# Patient Record
Sex: Female | Born: 1970 | Race: White | Hispanic: No | Marital: Married | Smoking: Never smoker
Health system: Southern US, Community
[De-identification: ages and names within clinical notes are randomized; demographics above are authoritative.]

## PROBLEM LIST (undated history)

## (undated) DIAGNOSIS — R32 Unspecified urinary incontinence: Secondary | ICD-10-CM

## (undated) DIAGNOSIS — IMO0002 Reserved for concepts with insufficient information to code with codable children: Secondary | ICD-10-CM

## (undated) DIAGNOSIS — I499 Cardiac arrhythmia, unspecified: Secondary | ICD-10-CM

## (undated) DIAGNOSIS — G43019 Migraine without aura, intractable, without status migrainosus: Secondary | ICD-10-CM

## (undated) DIAGNOSIS — K760 Fatty (change of) liver, not elsewhere classified: Secondary | ICD-10-CM

## (undated) DIAGNOSIS — G43709 Chronic migraine without aura, not intractable, without status migrainosus: Secondary | ICD-10-CM

## (undated) DIAGNOSIS — R7303 Prediabetes: Secondary | ICD-10-CM

## (undated) DIAGNOSIS — J45909 Unspecified asthma, uncomplicated: Secondary | ICD-10-CM

## (undated) DIAGNOSIS — R002 Palpitations: Secondary | ICD-10-CM

## (undated) DIAGNOSIS — E119 Type 2 diabetes mellitus without complications: Secondary | ICD-10-CM

## (undated) DIAGNOSIS — K219 Gastro-esophageal reflux disease without esophagitis: Secondary | ICD-10-CM

## (undated) DIAGNOSIS — E785 Hyperlipidemia, unspecified: Secondary | ICD-10-CM

## (undated) DIAGNOSIS — I10 Essential (primary) hypertension: Secondary | ICD-10-CM

## (undated) DIAGNOSIS — N809 Endometriosis, unspecified: Secondary | ICD-10-CM

## (undated) DIAGNOSIS — G35 Multiple sclerosis: Secondary | ICD-10-CM

## (undated) DIAGNOSIS — Z5189 Encounter for other specified aftercare: Secondary | ICD-10-CM

## (undated) DIAGNOSIS — G4730 Sleep apnea, unspecified: Secondary | ICD-10-CM

## (undated) DIAGNOSIS — E039 Hypothyroidism, unspecified: Secondary | ICD-10-CM

## (undated) HISTORY — PX: CHOLECYSTECTOMY: SHX55

## (undated) HISTORY — PX: ABDOMINAL HYSTERECTOMY: SHX81

## (undated) HISTORY — DX: Reserved for concepts with insufficient information to code with codable children: IMO0002

## (undated) HISTORY — DX: Cardiac arrhythmia, unspecified: I49.9

## (undated) HISTORY — DX: Sleep apnea, unspecified: G47.30

## (undated) HISTORY — DX: Hyperlipidemia, unspecified: E78.5

## (undated) HISTORY — DX: Encounter for other specified aftercare: Z51.89

## (undated) HISTORY — DX: Essential (primary) hypertension: I10

## (undated) HISTORY — PX: REPLACEMENT TOTAL KNEE: SUR1224

## (undated) HISTORY — DX: Hypothyroidism, unspecified: E03.9

## (undated) HISTORY — DX: Type 2 diabetes mellitus without complications: E11.9

## (undated) HISTORY — DX: Chronic migraine without aura, not intractable, without status migrainosus: G43.709

## (undated) HISTORY — DX: Gastro-esophageal reflux disease without esophagitis: K21.9

## (undated) HISTORY — DX: Multiple sclerosis: G35

## (undated) HISTORY — DX: Palpitations: R00.2

## (undated) HISTORY — DX: Endometriosis, unspecified: N80.9

## (undated) HISTORY — DX: Unspecified asthma, uncomplicated: J45.909

## (undated) HISTORY — DX: Fatty (change of) liver, not elsewhere classified: K76.0

## (undated) HISTORY — DX: Unspecified urinary incontinence: R32

## (undated) HISTORY — PX: ENDOMETRIAL ABLATION: SHX621

---
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
## 2018-06-07 HISTORY — PX: FOOT SURGERY: SHX648

## 2018-11-24 ENCOUNTER — Other Ambulatory Visit: Payer: Self-pay

## 2018-11-24 ENCOUNTER — Ambulatory Visit: Payer: Medicaid Other | Admitting: Physician Assistant

## 2018-11-24 ENCOUNTER — Encounter: Payer: Self-pay | Admitting: Physician Assistant

## 2018-11-24 VITALS — BP 128/90 | HR 88 | Temp 98.8°F

## 2018-11-24 DIAGNOSIS — Z862 Personal history of diseases of the blood and blood-forming organs and certain disorders involving the immune mechanism: Secondary | ICD-10-CM

## 2018-11-24 DIAGNOSIS — E669 Obesity, unspecified: Secondary | ICD-10-CM

## 2018-11-24 DIAGNOSIS — G35 Multiple sclerosis: Secondary | ICD-10-CM

## 2018-11-24 DIAGNOSIS — L719 Rosacea, unspecified: Secondary | ICD-10-CM

## 2018-11-24 DIAGNOSIS — E039 Hypothyroidism, unspecified: Secondary | ICD-10-CM

## 2018-11-24 DIAGNOSIS — G43909 Migraine, unspecified, not intractable, without status migrainosus: Secondary | ICD-10-CM

## 2018-11-24 DIAGNOSIS — Z7689 Persons encountering health services in other specified circumstances: Secondary | ICD-10-CM

## 2018-11-24 DIAGNOSIS — Z13220 Encounter for screening for lipoid disorders: Secondary | ICD-10-CM

## 2018-11-24 DIAGNOSIS — Z90710 Acquired absence of both cervix and uterus: Secondary | ICD-10-CM

## 2018-11-24 DIAGNOSIS — I10 Essential (primary) hypertension: Secondary | ICD-10-CM

## 2018-11-24 DIAGNOSIS — E119 Type 2 diabetes mellitus without complications: Secondary | ICD-10-CM

## 2018-11-24 DIAGNOSIS — J302 Other seasonal allergic rhinitis: Secondary | ICD-10-CM

## 2018-11-24 LAB — GLUCOSE, POCT (MANUAL RESULT ENTRY): POC Glucose: 153 mg/dl — AB (ref 70–99)

## 2018-11-24 MED ORDER — LISINOPRIL 10 MG PO TABS: 10.0000 mg | ORAL_TABLET | Freq: Every day | ORAL | 1 refills | Status: DC

## 2018-11-24 MED ORDER — LEVOTHYROXINE SODIUM 25 MCG PO TABS: 25.0000 ug | ORAL_TABLET | Freq: Every day | ORAL | 0 refills | Status: DC

## 2018-11-24 MED ORDER — METOPROLOL TARTRATE 100 MG PO TABS: 100.0000 mg | ORAL_TABLET | Freq: Two times a day (BID) | ORAL | 1 refills | Status: DC

## 2018-11-24 MED ORDER — LEVOTHYROXINE SODIUM 200 MCG PO TABS: 200.0000 ug | ORAL_TABLET | Freq: Every day | ORAL | 0 refills | Status: DC

## 2018-11-24 NOTE — Patient Instructions (Signed)
Notice of Award for both benefits

## 2018-11-24 NOTE — Progress Notes (Signed)
BP 128/90   Pulse 88   Temp 98.8 F (37.1 C)   SpO2 98%    Subjective:    Patient ID: Amy Lester, female    DOB: 01-Jul-1971, 48 y.o.   MRN: [id number]  HPI: Amy Lester is a 48 y.o. female presenting on 11/24/2018 for New Patient (Initial Visit) (pt recently moved here from [place] in Oct. 2019. pt last saw PCP in Sept. for her foot.)   HPI   Chief Complaint  Patient presents with  . New Patient (Initial Visit)    pt recently moved here from [place] in Oct. 2019. pt last saw PCP in Sept. for her foot.     Pt usually only uses her inhaler in spring and fall with allergies.  Pt self-stopped her metformin because it was making her feel dizzy and "yucky"  She has MS and was being treated for that through [facility] and is currently not on anything at present.  she c/o muscle pain and frequent falling.   She was diagnosed with MS in 2006 or so.   She says she Has roscea - she uses tretinoin and metrogel  Pt says she has seen cardiology for palpitations.  She says she had lots of tests and nothing abnormal was found, just palpitations  Pt says she has history of transusion (was due to heavy menses).  Pt states history of hyperlipidemia  Pt states only thyroid issue is hypothyroidism but she takes cytomel in addition to the levothyroxine.   Pt states last mammogram was in  June 2019  Pt brings in no bottles of medications, just a list.  Relevant past medical, surgical, family and social history reviewed and updated as indicated. Interim medical history since our last visit reviewed. Allergies and medications reviewed and updated.   Current Outpatient Medications:  .  albuterol (PROVENTIL HFA;VENTOLIN HFA) 108 (90 Base) MCG/ACT inhaler, Inhale 3 puffs into the lungs every 6 (six) hours as needed for wheezing or shortness of breath., Disp: , Rfl:  .  cetirizine (ZYRTEC) 10 MG chewable tablet, Chew 10 mg by mouth daily., Disp: , Rfl:  .  clotrimazole-betamethasone  (LOTRISONE) cream, Apply 1 application topically as needed., Disp: , Rfl:  .  estradiol (EVAMIST) 1.53 MG/SPRAY transdermal spray, Place 3 sprays onto the skin daily., Disp: , Rfl:  .  ferrous sulfate 325 (65 FE) MG tablet, Take 325 mg by mouth daily with breakfast., Disp: , Rfl:  .  Frovatriptan Succinate (FROVA PO), Take 4 mg by mouth as needed (headaches)., Disp: , Rfl:  .  hydrochlorothiazide (HYDRODIURIL) 25 MG tablet, Take 25 mg by mouth daily., Disp: , Rfl:  .  HYDROCODONE-ACETAMINOPHEN PO, Take 1 tablet by mouth as needed., Disp: , Rfl:  .  levothyroxine (SYNTHROID) 200 MCG tablet, Take 200 mcg by mouth daily before breakfast., Disp: , Rfl:  .  levothyroxine (SYNTHROID, LEVOTHROID) 25 MCG tablet, Take 25 mcg by mouth daily before breakfast., Disp: , Rfl:  .  liothyronine (CYTOMEL) 5 MCG tablet, Take 5 mcg by mouth daily., Disp: , Rfl:  .  lisinopril (PRINIVIL,ZESTRIL) 10 MG tablet, Take 10 mg by mouth daily., Disp: , Rfl:  .  metoprolol tartrate (LOPRESSOR) 25 MG tablet, Take 25 mg by mouth every morning., Disp: , Rfl:  .  metoprolol tartrate (LOPRESSOR) 50 MG tablet, Take 50 mg by mouth at bedtime., Disp: , Rfl:  .  metroNIDAZOLE (METROGEL) 1 % gel, Apply 1 application topically daily., Disp: , Rfl:  .  Multiple Vitamins-Minerals (MULTIVITAMIN WITH MINERALS) tablet, Take 1 tablet by mouth daily., Disp: , Rfl:  .  nystatin cream (MYCOSTATIN), Apply 1 application topically daily as needed for dry skin., Disp: , Rfl:  .  Olopatadine HCl (PAZEO) 0.7 % SOLN, Apply to eye every morning., Disp: , Rfl:  .  ondansetron (ZOFRAN) 8 MG tablet, Take 8 mg by mouth as needed for nausea or vomiting., Disp: , Rfl:  .  pantoprazole (PROTONIX) 40 MG tablet, Take 40 mg by mouth daily., Disp: , Rfl:  .  Red Yeast Rice Extract (RED YEAST RICE PO), Take by mouth., Disp: , Rfl:  .  Riboflavin (VITAMIN B-2 PO), Take by mouth., Disp: , Rfl:  .  Rizatriptan Benzoate (MAXALT PO), Take 1 tablet by mouth as needed  (Headaches)., Disp: , Rfl:  .  SUMAtriptan (IMITREX) 50 MG tablet, Take 50 mg by mouth as needed for migraine. May repeat in 2 hours if headache persists or recurs., Disp: , Rfl:  .  TRETINOIN EX, Apply topically daily., Disp: , Rfl:  .  Triamcinolone Acetonide (TRIAMCINOLONE 0.1 % CREAM : EUCERIN) CREA, Apply 1 application topically as needed., Disp: , Rfl:  .  TRIAMTERENE-HCTZ PO, Take 1 tablet by mouth daily., Disp: , Rfl:  .  metFORMIN (GLUMETZA) 500 MG (MOD) 24 hr tablet, Take 500 mg by mouth daily with breakfast., Disp: , Rfl:     Review of Systems  Constitutional: Positive for fatigue. Negative for appetite change, chills, diaphoresis, fever and unexpected weight change.  HENT: Positive for dental problem and ear pain. Negative for congestion, drooling, facial swelling, hearing loss, mouth sores, sneezing, sore throat, trouble swallowing and voice change.   Eyes: Positive for discharge. Negative for pain, redness, itching and visual disturbance.  Respiratory: Negative for cough, choking, shortness of breath and wheezing.   Cardiovascular: Positive for palpitations and leg swelling. Negative for chest pain.  Gastrointestinal: Positive for diarrhea. Negative for abdominal pain, blood in stool, constipation and vomiting.  Endocrine: Negative for cold intolerance, heat intolerance and polydipsia.  Genitourinary: Negative for decreased urine volume, dysuria and hematuria.  Musculoskeletal: Positive for gait problem. Negative for arthralgias and back pain.  Skin: Negative for rash.  Allergic/Immunologic: Positive for environmental allergies.  Neurological: Positive for light-headedness and headaches. Negative for seizures and syncope.  Hematological: Negative for adenopathy.  Psychiatric/Behavioral: Negative for agitation, dysphoric mood and suicidal ideas. The patient is not nervous/anxious.     Per HPI unless specifically indicated above     Objective:    BP 128/90   Pulse 88    Temp 98.8 F (37.1 C)   SpO2 98%   Wt Readings from Last 3 Encounters:  No data found for Wt    Physical Exam Vitals signs reviewed.  Constitutional:      Appearance: She is well-developed.  HENT:     Head: Normocephalic and atraumatic.     Mouth/Throat:     Pharynx: No oropharyngeal exudate.  Eyes:     Conjunctiva/sclera: Conjunctivae normal.     Pupils: Pupils are equal, round, and reactive to light.  Neck:     Musculoskeletal: Neck supple.     Thyroid: No thyromegaly.  Cardiovascular:     Rate and Rhythm: Normal rate and regular rhythm.  Pulmonary:     Effort: Pulmonary effort is normal.     Breath sounds: Normal breath sounds.  Abdominal:     General: Bowel sounds are normal.     Palpations: Abdomen is soft. There is  no mass.     Tenderness: There is no abdominal tenderness.  Musculoskeletal:     Right lower leg: No edema.     Left lower leg: No edema.  Lymphadenopathy:     Cervical: No cervical adenopathy.  Skin:    General: Skin is warm and dry.     Findings: No rash.  Neurological:     General: No focal deficit present.     Mental Status: She is alert and oriented to person, place, and time.     Cranial Nerves: No cranial nerve deficit or facial asymmetry.     Sensory: No sensory deficit.     Motor: No weakness or tremor.     Coordination: Coordination is intact. Coordination normal.     Gait: Gait normal.  Psychiatric:        Behavior: Behavior normal.     No results found for this or any previous visit.    Assessment & Plan:   Encounter Diagnoses  Name Primary?  . Encounter to establish care Yes  . Hypothyroidism, unspecified type   . Essential hypertension   . Multiple sclerosis (HCC)   . Migraine without status migrainosus, not intractable, unspecified migraine type   . Seasonal allergies   . Rosacea   . S/P hysterectomy   . Diabetes mellitus without complication (HCC)   . Screening for hyperlipidemia   . Obesity, unspecified  classification, unspecified obesity type, unspecified whether serious comorbidity present   . History of anemia     -will Get baseline labs -pt reports a multitude of medical conditions and has a normal exam.  Only abnormal exam would point to a mood disorder with her constant interruptions to the point of rudeness, her garrulousness and reports of extensive medical conditions -will refer to neurology for reported Multiple Sclerosis and migraines -pt was given application for [facility] -pt is signed up for [facility].  -pt has a smartphone -pt is given rx for levothyroxine, lisinopril and metoprolol.   -pt to follow up 1 month.  RTO sooner prn

## 2018-11-25 ENCOUNTER — Other Ambulatory Visit (HOSPITAL_COMMUNITY)
Admission: RE | Admit: 2018-11-25 | Discharge: 2018-11-25 | Disposition: A | Discharge status: Home / Self Care | Payer: Self-pay | Source: Ambulatory Visit | Attending: Physician Assistant | Admitting: Physician Assistant

## 2018-11-25 DIAGNOSIS — E119 Type 2 diabetes mellitus without complications: Secondary | ICD-10-CM | POA: Insufficient documentation

## 2018-11-25 DIAGNOSIS — Z13220 Encounter for screening for lipoid disorders: Secondary | ICD-10-CM | POA: Insufficient documentation

## 2018-11-25 DIAGNOSIS — E039 Hypothyroidism, unspecified: Secondary | ICD-10-CM | POA: Insufficient documentation

## 2018-11-25 DIAGNOSIS — I10 Essential (primary) hypertension: Secondary | ICD-10-CM | POA: Insufficient documentation

## 2018-11-25 DIAGNOSIS — Z862 Personal history of diseases of the blood and blood-forming organs and certain disorders involving the immune mechanism: Secondary | ICD-10-CM | POA: Insufficient documentation

## 2018-11-25 LAB — HEMOGLOBIN A1C
Hgb A1c MFr Bld: 6.5 % — ABNORMAL HIGH (ref 4.8–5.6)
Mean Plasma Glucose: 139.85 mg/dL

## 2018-11-25 LAB — CBC WITH DIFFERENTIAL/PLATELET
Abs Immature Granulocytes: 0.04 10*3/uL (ref 0.00–0.07)
Basophils Absolute: 0.1 10*3/uL (ref 0.0–0.1)
Basophils Relative: 1 %
Eosinophils Absolute: 0.3 10*3/uL (ref 0.0–0.5)
Eosinophils Relative: 3 %
HCT: 45.2 % (ref 36.0–46.0)
Hemoglobin: 14.6 g/dL (ref 12.0–15.0)
Immature Granulocytes: 1 %
Lymphocytes Relative: 26 %
Lymphs Abs: 2 10*3/uL (ref 0.7–4.0)
MCH: 28.8 pg (ref 26.0–34.0)
MCHC: 32.3 g/dL (ref 30.0–36.0)
MCV: 89.2 fL (ref 80.0–100.0)
Monocytes Absolute: 0.6 10*3/uL (ref 0.1–1.0)
Monocytes Relative: 7 %
Neutro Abs: 5 10*3/uL (ref 1.7–7.7)
Neutrophils Relative %: 62 %
Platelets: 380 10*3/uL (ref 150–400)
RBC: 5.07 MIL/uL (ref 3.87–5.11)
RDW: 13.3 % (ref 11.5–15.5)
WBC: 7.9 10*3/uL (ref 4.0–10.5)
nRBC: 0 % (ref 0.0–0.2)

## 2018-11-25 LAB — COMPREHENSIVE METABOLIC PANEL
ALT: 33 U/L (ref 0–44)
AST: 39 U/L (ref 15–41)
Albumin: 4.3 g/dL (ref 3.5–5.0)
Alkaline Phosphatase: 129 U/L — ABNORMAL HIGH (ref 38–126)
Anion gap: 11 (ref 5–15)
BUN: 17 mg/dL (ref 6–20)
CO2: 22 mmol/L (ref 22–32)
Calcium: 9.4 mg/dL (ref 8.9–10.3)
Chloride: 103 mmol/L (ref 98–111)
Creatinine, Ser: 0.82 mg/dL (ref 0.44–1.00)
GFR calc Af Amer: >60 mL/min (ref >60)
GFR calc non Af Amer: >60 mL/min (ref >60)
Glucose, Bld: 126 mg/dL — ABNORMAL HIGH (ref 70–99)
Potassium: 3.9 mmol/L (ref 3.5–5.1)
Sodium: 136 mmol/L (ref 135–145)
Total Bilirubin: 0.5 mg/dL (ref 0.3–1.2)
Total Protein: 8.7 g/dL — ABNORMAL HIGH (ref 6.5–8.1)

## 2018-11-25 LAB — T4, FREE: Free T4: 0.62 ng/dL — ABNORMAL LOW (ref 0.82–1.77)

## 2018-11-25 LAB — LIPID PANEL
Cholesterol: 255 mg/dL — ABNORMAL HIGH (ref 0–200)
HDL: 55 mg/dL (ref >40)
LDL Cholesterol: 176 mg/dL — ABNORMAL HIGH (ref 0–99)
Total CHOL/HDL Ratio: 4.6 RATIO
Triglycerides: 121 mg/dL (ref <150)
VLDL: 24 mg/dL (ref 0–40)

## 2018-11-25 LAB — TSH: TSH: 3.435 u[IU]/mL (ref 0.350–4.500)

## 2018-11-30 ENCOUNTER — Other Ambulatory Visit: Payer: Self-pay

## 2018-11-30 ENCOUNTER — Other Ambulatory Visit: Payer: Self-pay | Admitting: Physician Assistant

## 2018-11-30 ENCOUNTER — Encounter: Payer: Self-pay | Admitting: Physician Assistant

## 2018-11-30 ENCOUNTER — Ambulatory Visit: Payer: Medicaid Other | Admitting: Physician Assistant

## 2018-11-30 VITALS — BP 128/86 | HR 77 | Temp 97.7°F

## 2018-11-30 DIAGNOSIS — E039 Hypothyroidism, unspecified: Secondary | ICD-10-CM

## 2018-11-30 DIAGNOSIS — E669 Obesity, unspecified: Secondary | ICD-10-CM

## 2018-11-30 DIAGNOSIS — I10 Essential (primary) hypertension: Secondary | ICD-10-CM

## 2018-11-30 DIAGNOSIS — N9089 Other specified noninflammatory disorders of vulva and perineum: Secondary | ICD-10-CM

## 2018-11-30 DIAGNOSIS — E785 Hyperlipidemia, unspecified: Secondary | ICD-10-CM

## 2018-11-30 DIAGNOSIS — E119 Type 2 diabetes mellitus without complications: Secondary | ICD-10-CM

## 2018-11-30 MED ORDER — LEVOTHYROXINE SODIUM 200 MCG PO TABS: 200.0000 ug | ORAL_TABLET | Freq: Every day | ORAL | 0 refills | Status: DC

## 2018-11-30 MED ORDER — METOPROLOL TARTRATE 100 MG PO TABS: 100.0000 mg | ORAL_TABLET | Freq: Two times a day (BID) | ORAL | 0 refills | Status: DC

## 2018-11-30 MED ORDER — OMEPRAZOLE 40 MG PO CPDR: 40.0000 mg | DELAYED_RELEASE_CAPSULE | Freq: Every day | ORAL | 0 refills | Status: DC

## 2018-11-30 MED ORDER — LISINOPRIL 10 MG PO TABS: 10.0000 mg | ORAL_TABLET | Freq: Every day | ORAL | 0 refills | Status: DC

## 2018-11-30 MED ORDER — LEVOTHYROXINE SODIUM 25 MCG PO TABS: 25.0000 ug | ORAL_TABLET | Freq: Every day | ORAL | 0 refills | Status: DC

## 2018-11-30 MED ORDER — CETIRIZINE HCL 10 MG PO TABS: 10.0000 mg | ORAL_TABLET | Freq: Every day | ORAL | 0 refills | Status: DC

## 2018-11-30 MED ORDER — ALBUTEROL SULFATE HFA 108 (90 BASE) MCG/ACT IN AERS: 3.0000 | INHALATION_SPRAY | Freq: Four times a day (QID) | RESPIRATORY_TRACT | 0 refills | Status: DC | PRN

## 2018-11-30 NOTE — Progress Notes (Signed)
BP 128/86   Pulse 77   Temp 97.7 F (36.5 C)   SpO2 98%    Subjective:    Patient ID: Amy Lester, female    DOB: 1971-02-27, 48 y.o.   MRN: [id number]  HPI: Amy Lester is a 48 y.o. female presenting on 11/30/2018 for Mass (on left groin. noticed it Friday night while showering. pt states painful to touch. )   HPI   Chief Complaint  Patient presents with  . Mass    on left groin. noticed it Friday night while showering. pt states painful to touch.     Pt got off metformin due to it made her feel bad  Pt was on lipitor and zocor in the past and she had pains from both  Relevant past medical, surgical, family and social history reviewed and updated as indicated. Interim medical history since our last visit reviewed. Allergies and medications reviewed and updated.   Current Outpatient Medications:  .  albuterol (PROVENTIL HFA;VENTOLIN HFA) 108 (90 Base) MCG/ACT inhaler, Inhale 3 puffs into the lungs every 6 (six) hours as needed for wheezing or shortness of breath., Disp: , Rfl:  .  cetirizine (ZYRTEC) 10 MG chewable tablet, Chew 10 mg by mouth daily., Disp: , Rfl:  .  clotrimazole-betamethasone (LOTRISONE) cream, Apply 1 application topically as needed., Disp: , Rfl:  .  estradiol (EVAMIST) 1.53 MG/SPRAY transdermal spray, Place 3 sprays onto the skin daily., Disp: , Rfl:  .  ferrous sulfate 325 (65 FE) MG tablet, Take 325 mg by mouth daily with breakfast., Disp: , Rfl:  .  Frovatriptan Succinate (FROVA PO), Take 4 mg by mouth as needed (headaches)., Disp: , Rfl:  .  HYDROCODONE-ACETAMINOPHEN PO, Take 1 tablet by mouth as needed., Disp: , Rfl:  .  levothyroxine (SYNTHROID) 200 MCG tablet, Take 1 tablet (200 mcg total) by mouth daily before breakfast., Disp: 30 tablet, Rfl: 0 .  levothyroxine (SYNTHROID, LEVOTHROID) 25 MCG tablet, Take 1 tablet (25 mcg total) by mouth daily before breakfast., Disp: 30 tablet, Rfl: 0 .  liothyronine (CYTOMEL) 5 MCG tablet, Take 5 mcg by  mouth daily., Disp: , Rfl:  .  lisinopril (PRINIVIL,ZESTRIL) 10 MG tablet, Take 1 tablet (10 mg total) by mouth daily., Disp: 30 tablet, Rfl: 1 .  metoprolol tartrate (LOPRESSOR) 100 MG tablet, Take 1 tablet (100 mg total) by mouth 2 (two) times daily., Disp: 60 tablet, Rfl: 1 .  metroNIDAZOLE (METROGEL) 1 % gel, Apply 1 application topically daily., Disp: , Rfl:  .  Multiple Vitamins-Minerals (MULTIVITAMIN WITH MINERALS) tablet, Take 1 tablet by mouth daily., Disp: , Rfl:  .  nystatin cream (MYCOSTATIN), Apply 1 application topically daily as needed for dry skin., Disp: , Rfl:  .  Olopatadine HCl (PAZEO) 0.7 % SOLN, Apply to eye every morning., Disp: , Rfl:  .  ondansetron (ZOFRAN) 8 MG tablet, Take 8 mg by mouth as needed for nausea or vomiting., Disp: , Rfl:  .  pantoprazole (PROTONIX) 40 MG tablet, Take 40 mg by mouth daily., Disp: , Rfl:  .  Red Yeast Rice Extract (RED YEAST RICE PO), Take 1 tablet by mouth daily. , Disp: , Rfl:  .  Riboflavin (VITAMIN B-2 PO), Take by mouth., Disp: , Rfl:  .  Rizatriptan Benzoate (MAXALT PO), Take 1 tablet by mouth as needed (Headaches)., Disp: , Rfl:  .  SUMAtriptan (IMITREX) 50 MG tablet, Take 50 mg by mouth as needed for migraine. May repeat in 2 hours  if headache persists or recurs., Disp: , Rfl:  .  TRETINOIN EX, Apply topically daily., Disp: , Rfl:  .  Triamcinolone Acetonide (TRIAMCINOLONE 0.1 % CREAM : EUCERIN) CREA, Apply 1 application topically as needed., Disp: , Rfl:     Review of Systems  Per HPI unless specifically indicated above     Objective:    BP 128/86   Pulse 77   Temp 97.7 F (36.5 C)   SpO2 98%   Wt Readings from Last 3 Encounters:  No data found for Wt    Physical Exam Vitals signs reviewed. Exam conducted with a chaperone present.  Constitutional:      Appearance: She is obese.  HENT:     Head: Normocephalic and atraumatic.  Genitourinary:    Comments: Small firm nodule just below skin surface L labia majora.  No  fluctuance. No redness, no drainage.  Not a bartholin gland cyst Skin:    General: Skin is warm and dry.  Neurological:     Mental Status: She is alert and oriented to person, place, and time.     Results for orders placed or performed during the hospital encounter of 11/25/18  T4, free  Result Value Ref Range   Free T4 0.62 (L) 0.82 - 1.77 ng/dL  Hemoglobin Z6X  Result Value Ref Range   Hgb A1c MFr Bld 6.5 (H) 4.8 - 5.6 %   Mean Plasma Glucose 139.85 mg/dL  TSH  Result Value Ref Range   TSH 3.435 0.350 - 4.500 uIU/mL  CBC w/Diff/Platelet  Result Value Ref Range   WBC 7.9 4.0 - 10.5 K/uL   RBC 5.07 3.87 - 5.11 MIL/uL   Hemoglobin 14.6 12.0 - 15.0 g/dL   HCT 09.6 04.5 - 40.9 %   MCV 89.2 80.0 - 100.0 fL   MCH 28.8 26.0 - 34.0 pg   MCHC 32.3 30.0 - 36.0 g/dL   RDW 81.1 91.4 - 78.2 %   Platelets 380 150 - 400 K/uL   nRBC 0.0 0.0 - 0.2 %   Neutrophils Relative % 62 %   Neutro Abs 5.0 1.7 - 7.7 K/uL   Lymphocytes Relative 26 %   Lymphs Abs 2.0 0.7 - 4.0 K/uL   Monocytes Relative 7 %   Monocytes Absolute 0.6 0.1 - 1.0 K/uL   Eosinophils Relative 3 %   Eosinophils Absolute 0.3 0.0 - 0.5 K/uL   Basophils Relative 1 %   Basophils Absolute 0.1 0.0 - 0.1 K/uL   Immature Granulocytes 1 %   Abs Immature Granulocytes 0.04 0.00 - 0.07 K/uL  Lipid panel  Result Value Ref Range   Cholesterol 255 (H) 0 - 200 mg/dL   Triglycerides 956 <213 mg/dL   HDL 55 >08 mg/dL   Total CHOL/HDL Ratio 4.6 RATIO   VLDL 24 0 - 40 mg/dL   LDL Cholesterol 657 (H) 0 - 99 mg/dL  Comprehensive metabolic panel  Result Value Ref Range   Sodium 136 135 - 145 mmol/L   Potassium 3.9 3.5 - 5.1 mmol/L   Chloride 103 98 - 111 mmol/L   CO2 22 22 - 32 mmol/L   Glucose, Bld 126 (H) 70 - 99 mg/dL   BUN 17 6 - 20 mg/dL   Creatinine, Ser 8.46 0.44 - 1.00 mg/dL   Calcium 9.4 8.9 - 96.2 mg/dL   Total Protein 8.7 (H) 6.5 - 8.1 g/dL   Albumin 4.3 3.5 - 5.0 g/dL   AST 39 15 - 41 U/L  ALT 33 0 - 44 U/L    Alkaline Phosphatase 129 (H) 38 - 126 U/L   Total Bilirubin 0.5 0.3 - 1.2 mg/dL   GFR calc non Af Amer >60 >60 mL/min   GFR calc Af Amer >60 >60 mL/min   Anion gap 11 5 - 15      Assessment & Plan:    Encounter Diagnoses  Name Primary?  . Essential hypertension Yes  . Hyperlipidemia, unspecified hyperlipidemia type   . Hypothyroidism, unspecified type   . Diabetes mellitus without complication (HCC)   . Obesity, unspecified classification, unspecified obesity type, unspecified whether serious comorbidity present   . Lesion of labia     -encouraged pt Soaks in tub 3-4 times/day.  Can use warm compresses additionally.  Pt is encouraged to avoid tight clothing that rubs the labia.  Encouraged cotton undergarments (avoid nylon, synthetics) -Reviewed labs with pt -Add glipiizide 5mg  qd -Trial of lipiotr  10mg   -Stop iron -meds to [facility] -pt to Follow up 3 months.  Pt to call office sooner prn

## 2018-11-30 NOTE — Patient Instructions (Addendum)
  Add glipiizide 5mg  qd Trial of lipitor  10mg   Stop iron   -----------------------------------------------------------

## 2018-12-05 ENCOUNTER — Encounter: Payer: Self-pay | Admitting: Physician Assistant

## 2018-12-05 ENCOUNTER — Other Ambulatory Visit: Payer: Self-pay

## 2018-12-05 ENCOUNTER — Ambulatory Visit: Payer: Medicaid Other | Admitting: Physician Assistant

## 2018-12-05 VITALS — BP 120/86 | HR 81 | Temp 97.7°F

## 2018-12-05 DIAGNOSIS — N9089 Other specified noninflammatory disorders of vulva and perineum: Secondary | ICD-10-CM

## 2018-12-05 MED ORDER — DOXYCYCLINE HYCLATE 100 MG PO TABS: 100.0000 mg | ORAL_TABLET | Freq: Two times a day (BID) | ORAL | 0 refills | Status: DC

## 2018-12-05 NOTE — Progress Notes (Signed)
BP 120/86   Pulse 81   Temp 97.7 F (36.5 C)   SpO2 98%    Subjective:    Patient ID: Amy Lester, female    DOB: Jan 25, 1971, 48 y.o.   MRN: [id number]  HPI: Amy Lester is a 48 y.o. female presenting on 12/05/2018 for No chief complaint on file.   HPI   Pt says she thinks her bump on her labia is getting bigger.  She has only been soaking once/day.   She was seen in office for this on 11/30/18.   Pt's initial OV in this office was on 11/24/18 and today is her third appointment.   Relevant past medical, surgical, family and social history reviewed and updated as indicated. Interim medical history since our last visit reviewed. Allergies and medications reviewed and updated.   Current Outpatient Medications:  .  albuterol (PROVENTIL HFA;VENTOLIN HFA) 108 (90 Base) MCG/ACT inhaler, Inhale 3 puffs into the lungs every 6 (six) hours as needed for wheezing or shortness of breath., Disp: 3 Inhaler, Rfl: 0 .  cetirizine (ZYRTEC) 10 MG tablet, Take 1 tablet (10 mg total) by mouth daily., Disp: 90 tablet, Rfl: 0 .  clotrimazole-betamethasone (LOTRISONE) cream, Apply 1 application topically as needed., Disp: , Rfl:  .  estradiol (EVAMIST) 1.53 MG/SPRAY transdermal spray, Place 3 sprays onto the skin daily., Disp: , Rfl:  .  Frovatriptan Succinate (FROVA PO), Take 4 mg by mouth as needed (headaches)., Disp: , Rfl:  .  HYDROCODONE-ACETAMINOPHEN PO, Take 1 tablet by mouth as needed., Disp: , Rfl:  .  levothyroxine (SYNTHROID) 200 MCG tablet, Take 1 tablet (200 mcg total) by mouth daily before breakfast., Disp: 90 tablet, Rfl: 0 .  levothyroxine (SYNTHROID, LEVOTHROID) 25 MCG tablet, Take 1 tablet (25 mcg total) by mouth daily before breakfast., Disp: 90 tablet, Rfl: 0 .  lisinopril (PRINIVIL,ZESTRIL) 10 MG tablet, Take 1 tablet (10 mg total) by mouth daily., Disp: 90 tablet, Rfl: 0 .  metoprolol tartrate (LOPRESSOR) 100 MG tablet, Take 1 tablet (100 mg total) by mouth 2 (two) times daily.,  Disp: 180 tablet, Rfl: 0 .  metroNIDAZOLE (METROGEL) 1 % gel, Apply 1 application topically daily., Disp: , Rfl:  .  Multiple Vitamins-Minerals (MULTIVITAMIN WITH MINERALS) tablet, Take 1 tablet by mouth daily., Disp: , Rfl:  .  nystatin cream (MYCOSTATIN), Apply 1 application topically daily as needed for dry skin., Disp: , Rfl:  .  Olopatadine HCl (PAZEO) 0.7 % SOLN, Apply to eye every morning., Disp: , Rfl:  .  ondansetron (ZOFRAN) 8 MG tablet, Take 8 mg by mouth as needed for nausea or vomiting., Disp: , Rfl:  .  pantoprazole (PROTONIX) 40 MG tablet, Take 40 mg by mouth daily., Disp: , Rfl:  .  Red Yeast Rice Extract (RED YEAST RICE PO), Take 1 tablet by mouth daily. , Disp: , Rfl:  .  Riboflavin (VITAMIN B-2 PO), Take by mouth., Disp: , Rfl:  .  Rizatriptan Benzoate (MAXALT PO), Take 1 tablet by mouth as needed (Headaches)., Disp: , Rfl:  .  SUMAtriptan (IMITREX) 50 MG tablet, Take 50 mg by mouth as needed for migraine. May repeat in 2 hours if headache persists or recurs., Disp: , Rfl:  .  TRETINOIN EX, Apply topically daily., Disp: , Rfl:  .  Triamcinolone Acetonide (TRIAMCINOLONE 0.1 % CREAM : EUCERIN) CREA, Apply 1 application topically as needed., Disp: , Rfl:  .  omeprazole (PRILOSEC) 40 MG capsule, Take 1 capsule (40 mg total)  by mouth daily. (Patient not taking: Reported on 12/05/2018), Disp: 90 capsule, Rfl: 0   Review of Systems  Per HPI unless specifically indicated above     Objective:    BP 120/86   Pulse 81   Temp 97.7 F (36.5 C)   SpO2 98%   Wt Readings from Last 3 Encounters:  No data found for Wt    Physical Exam Vitals signs reviewed. Exam conducted with a chaperone present.  Constitutional:      Appearance: She is obese.  HENT:     Head: Normocephalic and atraumatic.  Pulmonary:     Effort: Pulmonary effort is normal. No respiratory distress.  Genitourinary:    Labia:        Left: Lesion present.      Comments: There is a small bump L labia majora, no  getting larger from last week. No redness, no fluctuance, no drainage.   - no cyst of bartholin gland.   (nurse Berenice assisted) Skin:    General: Skin is warm and dry.  Neurological:     Mental Status: She is alert and oriented to person, place, and time.           Assessment & Plan:     Encounter Diagnosis  Name Primary?  . Lesion of labia Yes    Pt is counseled that she needs to soak more than once/day.  Recommend at least 4/day.   rx doxycyline -pt to follow up June as scheduled.  Pt to call office sooner prn

## 2018-12-14 ENCOUNTER — Other Ambulatory Visit: Payer: Self-pay | Admitting: Physician Assistant

## 2018-12-19 ENCOUNTER — Other Ambulatory Visit: Payer: Self-pay | Admitting: Physician Assistant

## 2018-12-19 MED ORDER — TRIAMCINOLONE ACETONIDE 0.1 % EX CREA: 1.0000 "application " | TOPICAL_CREAM | Freq: Two times a day (BID) | CUTANEOUS | 0 refills | Status: DC | PRN

## 2018-12-27 ENCOUNTER — Ambulatory Visit: Payer: Self-pay | Admitting: Physician Assistant

## 2019-02-13 ENCOUNTER — Telehealth: Payer: Self-pay | Admitting: Neurology

## 2019-02-13 NOTE — Telephone Encounter (Signed)
Due to current COVID 19 pandemic, our office is severely reducing in office visits until further notice, in order to minimize the risk to our patients and healthcare providers.   Called patient to offer a virtual visit for her 6/11 appointment. Patient declined and states she would like an in office visit. Patient verbalized understanding of the precautions we are taking for in office visits. Patient understands that upon arrival she will next to park next to the entrance and a staff member will come out to assist her.

## 2019-02-15 ENCOUNTER — Other Ambulatory Visit: Payer: Self-pay | Admitting: Physician Assistant

## 2019-02-15 DIAGNOSIS — I10 Essential (primary) hypertension: Secondary | ICD-10-CM

## 2019-02-15 DIAGNOSIS — E039 Hypothyroidism, unspecified: Secondary | ICD-10-CM

## 2019-02-15 DIAGNOSIS — E119 Type 2 diabetes mellitus without complications: Secondary | ICD-10-CM

## 2019-02-15 DIAGNOSIS — E785 Hyperlipidemia, unspecified: Secondary | ICD-10-CM

## 2019-02-16 ENCOUNTER — Encounter: Payer: Self-pay | Admitting: Neurology

## 2019-02-16 ENCOUNTER — Other Ambulatory Visit: Payer: Self-pay

## 2019-02-16 ENCOUNTER — Telehealth: Payer: Self-pay | Admitting: *Deleted

## 2019-02-16 ENCOUNTER — Ambulatory Visit (INDEPENDENT_AMBULATORY_CARE_PROVIDER_SITE_OTHER): Payer: Self-pay | Admitting: Neurology

## 2019-02-16 ENCOUNTER — Telehealth: Payer: Self-pay | Admitting: Neurology

## 2019-02-16 VITALS — BP 140/95 | HR 80 | Temp 97.9°F | Ht 64.0 in | Wt 222.0 lb

## 2019-02-16 DIAGNOSIS — G35 Multiple sclerosis: Secondary | ICD-10-CM | POA: Insufficient documentation

## 2019-02-16 DIAGNOSIS — Z5181 Encounter for therapeutic drug level monitoring: Secondary | ICD-10-CM

## 2019-02-16 DIAGNOSIS — G43719 Chronic migraine without aura, intractable, without status migrainosus: Secondary | ICD-10-CM | POA: Insufficient documentation

## 2019-02-16 DIAGNOSIS — G43019 Migraine without aura, intractable, without status migrainosus: Secondary | ICD-10-CM

## 2019-02-16 HISTORY — DX: Migraine without aura, intractable, without status migrainosus: G43.019

## 2019-02-16 MED ORDER — KETOROLAC TROMETHAMINE 10 MG PO TABS: 10.0000 mg | ORAL_TABLET | Freq: Three times a day (TID) | ORAL | 1 refills | Status: DC | PRN

## 2019-02-16 NOTE — Telephone Encounter (Signed)
Received a call from Estill Bamberg, pharmacist at [facility] asking for clarification on Toradol Rx sent in by Dr Jannifer Franklin today. She stated Rx is Disp #30 which is a 10 day supply. She stated the Rx is typically never given for more than 5 days at a time. She asked for a call back with clarification. I advised will route note to Dr Jannifer Franklin. Estill Bamberg  verbalized understanding, appreciation.

## 2019-02-16 NOTE — Progress Notes (Signed)
JCV lab placed in [facility] pick up box at [facility].

## 2019-02-16 NOTE — Telephone Encounter (Signed)
[facility] order sent to [facility]. They will reach out to the patient to schedule.

## 2019-02-16 NOTE — Telephone Encounter (Signed)
I called and tried to speak with the pharmacy, unable to do so directly, left a message.  The Toradol prescription is to be taken on an as-needed basis, it is not to be taking scheduled for 5 or 10 days in a row.  It will be used as a rescue drug for migraine.

## 2019-02-16 NOTE — Patient Instructions (Signed)
Use only maxalt as a rescue drug for the migraine. I will call in ketoralac 10 mg tablets to take if needed for migraine as well. We will try to get Botox started.

## 2019-02-16 NOTE — Progress Notes (Signed)
Reason for visit: Multiple sclerosis, intractable migraine  Referring physician: Dr. Lissa Hoard C. Lester is a 48 y.o. female  History of present illness:  Amy Lester is a 48 year old left-handed white female with a history of multiple sclerosis.  She claims an MS diagnosis was made 9 years ago at [facility], she was in the [facility] there, apparently involved with MS trials of some sort.  The patient claims that she has had MRI evaluations of the brain and cervical spine, she has had lumbar puncture done.  She initially presented with gait instability, pain in the arms and legs and difficulty with dropping things.  The patient does not know of any of the medications that she has been treated with.  There are no medical records available to me regarding the MS work-up.  The patient has not had any visual loss or double vision, she reports some chronic issues with numbness involving the left arm and leg, the left arm and leg feels slightly weaker.  She has pain throughout the body including the arms and legs and thighs.  She has sleep apnea on CPAP.  She has significant obesity issues.  She reports some problems with urinary incontinence, there is ongoing problems with gait instability, she has not fallen recently.  The patient has also had significant problems with migraine headaches of the last 4 to 5 years and is averaging 16-20 headache days a month.  The patient has been on Aimovig, Topamax, gabapentin, metoprolol, and Zonegran in the past for her headaches.  She claims that she has tried Botox in the past but only had an injection or 2 and did not continue.  The patient is taking large amount of triptan medications, she is alternating Frova, Imitrex, and Maxalt, taking these medications greater than 3 days a week.  The patient does not know of any particular activators for headache, she is not sleeping well at night.  She does have photophobia without phonophobia with her headache and she does have  some nausea and vomiting.  The headaches are in the frontal regions bilaterally and a spread to the occipital area.  She claims that her daughter also has migraine headache.  The patient does have glucose intolerance, her most recent hemoglobin A1c was 6.5.  Past Medical History:  Diagnosis Date   Asthma    Chronic migraine    Common migraine with intractable migraine 02/16/2019   Diabetes mellitus without complication (HCC)    Endometriosis    Fatty liver    GERD (gastroesophageal reflux disease)    Hypertension    Hypothyroidism    Incontinence    Irregular heartbeat    Multiple sclerosis ([place])    Palpitations    Sleep apnea     Past Surgical History:  Procedure Laterality Date   ABDOMINAL HYSTERECTOMY     CESAREAN SECTION     x1   CHOLECYSTECTOMY     ENDOMETRIAL ABLATION     FOOT SURGERY Left 06/2018   foot reconstruction    Family History  Problem Relation Age of Onset   Heart attack Mother    Stroke Mother    Cancer Mother        breast cancer   Hyperthyroidism Mother    Hyperlipidemia Mother    Cancer Father        lymph nodes, liver cancer   Other Daughter        Fine Gold Type II Syndrome and cognetive issues   Memory loss  Daughter    Other Son        Fine Gold Syndrome and BPES    Social history:  reports that she has never smoked. She has never used smokeless tobacco. She reports current alcohol use. She reports that she does not use drugs.  Medications:  Prior to Admission medications   Medication Sig Start Date End Date Taking? Authorizing Provider  albuterol (PROVENTIL HFA;VENTOLIN HFA) 108 (90 Base) MCG/ACT inhaler Inhale 3 puffs into the lungs every 6 (six) hours as needed for wheezing or shortness of breath. 11/30/18  Yes Jacquelin Hawking, PA-C  cetirizine (ZYRTEC) 10 MG tablet Take 1 tablet (10 mg total) by mouth daily. 11/30/18  Yes Jacquelin Hawking, PA-C  clotrimazole-betamethasone (LOTRISONE) cream Apply 1 application  topically as needed.   Yes [provider]  estradiol (EVAMIST) 1.53 MG/SPRAY transdermal spray Place 3 sprays onto the skin daily.   Yes [provider]  hydrochlorothiazide (HYDRODIURIL) 25 MG tablet Take 25 mg by mouth daily.   Yes [provider]  HYDROCODONE-ACETAMINOPHEN PO Take 1 tablet by mouth as needed.   Yes [provider]  levothyroxine (SYNTHROID) 200 MCG tablet Take 1 tablet (200 mcg total) by mouth daily before breakfast. 11/30/18  Yes Jacquelin Hawking, PA-C  levothyroxine (SYNTHROID, LEVOTHROID) 25 MCG tablet Take 1 tablet (25 mcg total) by mouth daily before breakfast. 11/30/18  Yes Jacquelin Hawking, PA-C  liothyronine (CYTOMEL) 5 MCG tablet Take 5 mcg by mouth daily.   Yes [provider]  lisinopril (PRINIVIL,ZESTRIL) 10 MG tablet Take 1 tablet (10 mg total) by mouth daily. 11/30/18  Yes Jacquelin Hawking, PA-C  metoprolol tartrate (LOPRESSOR) 25 MG tablet Take 25 mg by mouth 2 (two) times daily.   Yes [provider]  metoprolol tartrate (LOPRESSOR) 25 MG tablet Take 25 mg by mouth 2 (two) times daily.   Yes [provider]  metroNIDAZOLE (METROGEL) 1 % gel Apply 1 application topically daily.   Yes [provider]  Olopatadine HCl (PAZEO) 0.7 % SOLN Apply to eye every morning.   Yes [provider]  ondansetron (ZOFRAN) 8 MG tablet Take 8 mg by mouth as needed for nausea or vomiting.   Yes [provider]  pantoprazole (PROTONIX) 40 MG tablet Take 40 mg by mouth daily.   Yes [provider]  Rizatriptan Benzoate (MAXALT PO) Take 1 tablet by mouth as needed (Headaches).   Yes [provider]  TRETINOIN EX Apply topically daily.   Yes [provider]  triamcinolone cream (KENALOG) 0.1 % Apply 1 application topically 2 (two) times daily as needed. 12/19/18  Yes McElroy, Carollee Herter, PA-C  UNABLE TO FIND Med Name: HZTZ 37.5 mg   Yes [provider]  ketorolac  (TORADOL) 10 MG tablet Take 1 tablet (10 mg total) by mouth every 8 (eight) hours as needed. 02/16/19   York Spaniel, MD  Multiple Vitamins-Minerals (MULTIVITAMIN WITH MINERALS) tablet Take 1 tablet by mouth daily.    [provider]  nystatin cream (MYCOSTATIN) Apply 1 application topically daily as needed for dry skin.    [provider]  omeprazole (PRILOSEC) 40 MG capsule Take 1 capsule (40 mg total) by mouth daily. Patient not taking: Reported on 12/05/2018 11/30/18   Jacquelin Hawking, PA-C  Red Yeast Rice Extract (RED YEAST RICE PO) Take 1 tablet by mouth daily.     [provider]  Riboflavin (VITAMIN B-2 PO) Take by mouth.    [provider]  Allergies  Allergen Reactions   Percocet [Oxycodone-Acetaminophen] Nausea And Vomiting    ROS:  Out of a complete 14 system review of symptoms, the patient complains only of the following symptoms, and all other reviewed systems are negative.  Numbness Arm and leg discomfort Headache  Blood pressure (!) 140/95, pulse 80, temperature 97.9 F (36.6 C), temperature source Oral, height 5\' 4"  (1.626 m), weight 222 lb (100.7 kg).  Physical Exam  General: The patient is alert and cooperative at the time of the examination.  The patient is markedly obese.  Eyes: Pupils are equal, round, and reactive to light. Discs are flat bilaterally.  Neck: The neck is supple, no carotid bruits are noted.  Respiratory: The respiratory examination is clear.  Cardiovascular: The cardiovascular examination reveals a regular rate and rhythm, no obvious murmurs or rubs are noted.  Skin: Extremities are without significant edema.  Neurologic Exam  Mental status: The patient is alert and oriented x 3 at the time of the examination. The patient has apparent normal recent and remote memory, with an apparently normal attention span and concentration ability.  Cranial nerves: Facial symmetry is present. There is good  sensation of the face to pinprick and soft touch bilaterally. The strength of the facial muscles and the muscles to head turning and shoulder shrug are normal bilaterally. Speech is well enunciated, no aphasia or dysarthria is noted. Extraocular movements are full. Visual fields are full. The tongue is midline, and the patient has symmetric elevation of the soft palate. No obvious hearing deficits are noted.  Motor: The motor testing reveals 5 over 5 strength of all 4 extremities. Good symmetric motor tone is noted throughout.  Sensory: Sensory testing is intact to pinprick, soft touch, vibration sensation, and position sense on all 4 extremities, with exception of some slight decrease in pinprick and vibration sensation on the left lower extremity. No evidence of extinction is noted.  Coordination: Cerebellar testing reveals good finger-nose-finger and heel-to-shin bilaterally.  Gait and station: Gait is normal. Tandem gait is normal. Romberg is negative. No drift is seen.  Reflexes: Deep tendon reflexes are symmetric and normal bilaterally, with exception of a slight decrease in the left ankle jerk reflexes compared to the right. Toes are downgoing bilaterally.   Assessment/Plan:  1.  Multiple sclerosis  2.  Intractable migraine headache  3.  Sleep apnea on CPAP  The patient gives a history of multiple sclerosis, her current neurologic examination is essentially normal objectively.  The patient has a lot of diffuse pain in the arms and legs that appears to be most consistent with fibromyalgia.  The patient will undergo MRI of the brain and cervical spine to obtain a baseline, she has been off of medical therapy for MS for 2 years.  We will get blood work today.  The patient is to take only Maxalt if needed for headache, she is not to mix with other triptan medications such as Frova or Imitrex.  We will give her some Toradol to take if needed as well for the headache.  We will try to initiate  Botox therapy for the headache.  The patient will follow-up here in 4 months.  If possible, I would like to get records from [place] regarding her work-up and treatment for MS.   Amy Lester. Keith Sharol Croghan MD 02/16/2019 12:07 PM  [facility] [address] [place], [place] [id number]  Phone [phone] Fax [phone]

## 2019-02-20 ENCOUNTER — Telehealth: Payer: Self-pay | Admitting: Neurology

## 2019-02-20 NOTE — Telephone Encounter (Signed)
I called the patient to schedule an injection for Botox initial start. She did not answer so I left a VM asking her to call me back.

## 2019-02-21 ENCOUNTER — Telehealth: Payer: Self-pay

## 2019-02-21 LAB — QUANTIFERON-TB GOLD PLUS
QuantiFERON Mitogen Value: >10 IU/mL
QuantiFERON Nil Value: 0.02 IU/mL
QuantiFERON TB1 Ag Value: 0.02 IU/mL
QuantiFERON TB2 Ag Value: 0.02 IU/mL
QuantiFERON-TB Gold Plus: NEGATIVE

## 2019-02-21 LAB — HEPATITIS B CORE ANTIBODY, TOTAL: Hep B Core Total Ab: NEGATIVE

## 2019-02-21 LAB — HEPATITIS C ANTIBODY: Hep C Virus Ab: <0.1 s/co ratio (ref 0.0–0.9)

## 2019-02-21 LAB — VARICELLA ZOSTER ANTIBODY, IGG: Varicella zoster IgG: >4000 index (ref >165)

## 2019-02-21 NOTE — Telephone Encounter (Signed)
I reached out to the pt and advised on lab results. She verbalized understanding.  Pt wanted to know during waiting process for her botox if Dr. Jannifer Franklin could recommend a medication in the mean time to help with migraine control.  I inquired if she had picked up the Toradol that Dr. Jannifer Franklin submitted on 02/16/19. Pt states she has not.  I advised to go a head and pick this medication up. I also advised Julian Reil had reached out to her on 02/20/19 and left a vm asking her to call back so botox could be rescheduled.   I put the pt on hold to check with Andee Poles on when she could be scheduled but the phone line disconnected.  I called the pt back and scheduled her for 03/08/19 at 2 pm for botox appt.

## 2019-02-21 NOTE — Telephone Encounter (Signed)
-----   Message from Kathrynn Ducking, MD sent at 02/21/2019  7:25 AM EDT -----  The blood work results are unremarkable. Please call the patient. ----- Message ----- From: Lavone Neri Lab Results In Sent: 02/17/2019   9:36 AM EDT To: Kathrynn Ducking, MD

## 2019-02-21 NOTE — Telephone Encounter (Signed)
Lvm for pt to return my call so we could discuss lab results.

## 2019-02-21 NOTE — Telephone Encounter (Signed)
Pt is requesting a refill of her maxalt, would you like to submit the 5 or 10 mg? Mar did not specify.

## 2019-02-22 MED ORDER — QUETIAPINE FUMARATE 25 MG PO TABS: ORAL_TABLET | ORAL | 3 refills | Status: DC

## 2019-02-22 NOTE — Telephone Encounter (Signed)
Pt has been scheduled for botox on 03/08/19. Due to the pt's insurance ( [employer]) we will be unable to administer the medication here.  Danielle discussed this with Angie to verify.  Will fwd message to MD to see what he would recommend.

## 2019-02-22 NOTE — Telephone Encounter (Signed)
Error

## 2019-02-22 NOTE — Telephone Encounter (Signed)
I called the patient.  The patient is unable to get Botox to this office, her insurance basically not pay for the medication resulting in at $1200 loss per injection.  The patient will be placed on Seroquel starting at 25 mg at night, working up to 50 mg, she will call for any further dose adjustments.

## 2019-02-22 NOTE — Addendum Note (Signed)
Addended by: Kathrynn Ducking on: 02/22/2019 04:57 PM   Modules accepted: Orders

## 2019-02-24 ENCOUNTER — Other Ambulatory Visit (HOSPITAL_COMMUNITY)
Admission: RE | Admit: 2019-02-24 | Discharge: 2019-02-24 | Disposition: A | Discharge status: Home / Self Care | Payer: Medicaid Other | Source: Ambulatory Visit | Attending: Physician Assistant | Admitting: Physician Assistant

## 2019-02-24 ENCOUNTER — Other Ambulatory Visit: Payer: Self-pay

## 2019-02-24 DIAGNOSIS — E785 Hyperlipidemia, unspecified: Secondary | ICD-10-CM

## 2019-02-24 DIAGNOSIS — E039 Hypothyroidism, unspecified: Secondary | ICD-10-CM | POA: Insufficient documentation

## 2019-02-24 DIAGNOSIS — E119 Type 2 diabetes mellitus without complications: Secondary | ICD-10-CM

## 2019-02-24 DIAGNOSIS — I10 Essential (primary) hypertension: Secondary | ICD-10-CM | POA: Insufficient documentation

## 2019-02-24 LAB — COMPREHENSIVE METABOLIC PANEL
ALT: 34 U/L (ref 0–44)
AST: 29 U/L (ref 15–41)
Albumin: 4.1 g/dL (ref 3.5–5.0)
Alkaline Phosphatase: 122 U/L (ref 38–126)
Anion gap: 12 (ref 5–15)
BUN: 18 mg/dL (ref 6–20)
CO2: 22 mmol/L (ref 22–32)
Calcium: 9.3 mg/dL (ref 8.9–10.3)
Chloride: 104 mmol/L (ref 98–111)
Creatinine, Ser: 0.82 mg/dL (ref 0.44–1.00)
GFR calc Af Amer: >60 mL/min (ref >60)
GFR calc non Af Amer: >60 mL/min (ref >60)
Glucose, Bld: 149 mg/dL — ABNORMAL HIGH (ref 70–99)
Potassium: 3.7 mmol/L (ref 3.5–5.1)
Sodium: 138 mmol/L (ref 135–145)
Total Bilirubin: 0.5 mg/dL (ref 0.3–1.2)
Total Protein: 8.3 g/dL — ABNORMAL HIGH (ref 6.5–8.1)

## 2019-02-24 LAB — LIPID PANEL
Cholesterol: 246 mg/dL — ABNORMAL HIGH (ref 0–200)
HDL: 45 mg/dL (ref >40)
LDL Cholesterol: 163 mg/dL — ABNORMAL HIGH (ref 0–99)
Total CHOL/HDL Ratio: 5.5 RATIO
Triglycerides: 188 mg/dL — ABNORMAL HIGH (ref <150)
VLDL: 38 mg/dL (ref 0–40)

## 2019-02-24 LAB — T4, FREE: Free T4: 0.73 ng/dL (ref 0.61–1.12)

## 2019-02-24 LAB — TSH: TSH: 9.694 u[IU]/mL — ABNORMAL HIGH (ref 0.350–4.500)

## 2019-02-24 LAB — HEMOGLOBIN A1C
Hgb A1c MFr Bld: 6.5 % — ABNORMAL HIGH (ref 4.8–5.6)
Mean Plasma Glucose: 139.85 mg/dL

## 2019-02-27 ENCOUNTER — Encounter: Payer: Self-pay | Admitting: Physician Assistant

## 2019-02-27 ENCOUNTER — Ambulatory Visit: Payer: Medicaid Other | Admitting: Physician Assistant

## 2019-02-27 ENCOUNTER — Telehealth: Payer: Self-pay | Admitting: Neurology

## 2019-02-27 DIAGNOSIS — E785 Hyperlipidemia, unspecified: Secondary | ICD-10-CM

## 2019-02-27 DIAGNOSIS — I10 Essential (primary) hypertension: Secondary | ICD-10-CM

## 2019-02-27 DIAGNOSIS — E039 Hypothyroidism, unspecified: Secondary | ICD-10-CM

## 2019-02-27 DIAGNOSIS — E119 Type 2 diabetes mellitus without complications: Secondary | ICD-10-CM

## 2019-02-27 DIAGNOSIS — G43909 Migraine, unspecified, not intractable, without status migrainosus: Secondary | ICD-10-CM

## 2019-02-27 DIAGNOSIS — E669 Obesity, unspecified: Secondary | ICD-10-CM

## 2019-02-27 MED ORDER — LEVOTHYROXINE SODIUM 25 MCG PO TABS: 25.0000 ug | ORAL_TABLET | Freq: Every day | ORAL | 0 refills | Status: DC

## 2019-02-27 MED ORDER — ATORVASTATIN CALCIUM 10 MG PO TABS: 10.0000 mg | ORAL_TABLET | Freq: Every day | ORAL | 1 refills | Status: DC

## 2019-02-27 MED ORDER — GLIPIZIDE 5 MG PO TABS: 5.0000 mg | ORAL_TABLET | Freq: Every day | ORAL | 1 refills | Status: DC

## 2019-02-27 MED ORDER — LEVOTHYROXINE SODIUM 200 MCG PO TABS: 200.0000 ug | ORAL_TABLET | Freq: Every day | ORAL | 0 refills | Status: DC

## 2019-02-27 MED ORDER — LISINOPRIL 10 MG PO TABS: 10.0000 mg | ORAL_TABLET | Freq: Every day | ORAL | 0 refills | Status: DC

## 2019-02-27 MED ORDER — METOPROLOL TARTRATE 100 MG PO TABS: 100.0000 mg | ORAL_TABLET | Freq: Two times a day (BID) | ORAL | 1 refills | Status: DC

## 2019-02-27 NOTE — Progress Notes (Signed)
There were no vitals taken for this visit.   Subjective:    Patient ID: Amy Lester, female    DOB: [birth year], 48 y.o.   MRN: [id number]  HPI: Amy Lester is a 48 y.o. female presenting on 02/27/2019 for No chief complaint on file.   HPI   This is a telemedicine visit through [employer] due to coronavirus pandemic   I connected with  Amy Lester on 02/27/19 by a video enabled telemedicine application and verified that I am speaking with the correct person using two identifiers.   I discussed the limitations of evaluation and management by telemedicine. The patient expressed understanding and agreed to proceed.  Pt is at home.  Provider is at office  Pt says she never got her glipizide and lipitor.  Rx never made it to [facility]  Pt was seen by neurology for migraines and reported history of MS.   Pt spent a great deal of time relating what transpired at her visit and what medications were ordered even though she was told that office notes are available through Epic to provider.  Pt repeated said she didn't get her imitrex and she was told to take that.   Instructions from her visit with neuro were read to her stating that she was to take maxalt.  Pt said okay.   Pt goes on and on talking about her meds, several minutes on each one when asked if she is taking each.  Due to her extensive list, over 30 minutes has passed and her appointment time is over.  Discussed with pt that yes or no and not a 3-4 minute conversation on each would allow more time to discuss her medical issues and concerns at future appointments  She says she understands.  Reviewed labs with pt.    Pt requested referral to endocrinology for her diabetes.  Discussed with pt that her a1c is 6.5 and referral to specialist is not indicated.    Pt messes with controls on her device throughout appointment requiring that she had to be connected to video service multiple times because she kept saying she could no  longer hear provider.    Relevant past medical, surgical, family and social history reviewed and updated as indicated. Interim medical history since our last visit reviewed. Allergies and medications reviewed and updated.   Current Outpatient Medications:  .  albuterol (PROVENTIL HFA;VENTOLIN HFA) 108 (90 Base) MCG/ACT inhaler, Inhale 3 puffs into the lungs every 6 (six) hours as needed for wheezing or shortness of breath., Disp: 3 Inhaler, Rfl: 0 .  cetirizine (ZYRTEC) 10 MG tablet, Take 1 tablet (10 mg total) by mouth daily., Disp: 90 tablet, Rfl: 0 .  clotrimazole-betamethasone (LOTRISONE) cream, Apply 1 application topically as needed., Disp: , Rfl:  .  HYDROCODONE-ACETAMINOPHEN PO, Take 1 tablet by mouth as needed., Disp: , Rfl:  .  levothyroxine (SYNTHROID) 200 MCG tablet, Take 1 tablet (200 mcg total) by mouth daily before breakfast., Disp: 90 tablet, Rfl: 0 .  levothyroxine (SYNTHROID, LEVOTHROID) 25 MCG tablet, Take 1 tablet (25 mcg total) by mouth daily before breakfast., Disp: 90 tablet, Rfl: 0 .  lisinopril (PRINIVIL,ZESTRIL) 10 MG tablet, Take 1 tablet (10 mg total) by mouth daily., Disp: 90 tablet, Rfl: 0 .  metoprolol succinate (TOPROL-XL) 100 MG 24 hr tablet, Take 100 mg by mouth daily. Take with or immediately following a meal., Disp: , Rfl:  .  metroNIDAZOLE (METROGEL) 1 % gel, Apply 1 application topically daily.,  Disp: , Rfl:  .  Multiple Vitamins-Minerals (MULTIVITAMIN WITH MINERALS) tablet, Take 1 tablet by mouth daily., Disp: , Rfl:  .  nystatin cream (MYCOSTATIN), Apply 1 application topically daily as needed for dry skin., Disp: , Rfl:  .  Olopatadine HCl (PAZEO) 0.7 % SOLN, Apply to eye every morning., Disp: , Rfl:  .  omeprazole (PRILOSEC) 40 MG capsule, Take 1 capsule (40 mg total) by mouth daily., Disp: 90 capsule, Rfl: 0 .  ondansetron (ZOFRAN) 8 MG tablet, Take 8 mg by mouth as needed for nausea or vomiting., Disp: , Rfl:  .  Red Yeast Rice Extract (RED YEAST RICE  PO), Take 1 tablet by mouth daily. , Disp: , Rfl:  .  Riboflavin (VITAMIN B-2 PO), Take by mouth., Disp: , Rfl:  .  TRETINOIN EX, Apply topically daily., Disp: , Rfl:  .  triamcinolone cream (KENALOG) 0.1 %, Apply 1 application topically 2 (two) times daily as needed., Disp: 45 g, Rfl: 0 .  UNABLE TO FIND, Med Name: HZTZ 37.5 mg, Disp: , Rfl:  .  estradiol (EVAMIST) 1.53 MG/SPRAY transdermal spray, Place 3 sprays onto the skin daily., Disp: , Rfl:  .  hydrochlorothiazide (HYDRODIURIL) 25 MG tablet, Take 25 mg by mouth daily., Disp: , Rfl:  .  ketorolac (TORADOL) 10 MG tablet, Take 1 tablet (10 mg total) by mouth every 8 (eight) hours as needed. (Patient not taking: Reported on 02/27/2019), Disp: 30 tablet, Rfl: 1 .  liothyronine (CYTOMEL) 5 MCG tablet, Take 5 mcg by mouth daily., Disp: , Rfl:  .  metoprolol tartrate (LOPRESSOR) 25 MG tablet, Take 25 mg by mouth 2 (two) times daily., Disp: , Rfl:  .  metoprolol tartrate (LOPRESSOR) 25 MG tablet, Take 25 mg by mouth 2 (two) times daily., Disp: , Rfl:  .  pantoprazole (PROTONIX) 40 MG tablet, Take 40 mg by mouth daily., Disp: , Rfl:  .  QUEtiapine (SEROQUEL) 25 MG tablet, 1 tablet at night for 2 weeks, then take 2 tablets at night (Patient not taking: Reported on 02/27/2019), Disp: 60 tablet, Rfl: 3 .  Rizatriptan Benzoate (MAXALT PO), Take 1 tablet by mouth as needed (Headaches)., Disp: , Rfl:     Review of Systems  Per HPI unless specifically indicated above     Objective:    There were no vitals taken for this visit.  Wt Readings from Last 3 Encounters:  02/16/19 222 lb (100.7 kg)    Physical Exam Constitutional:      General: She is not in acute distress.    Appearance: Normal appearance. She is obese. She is not ill-appearing.  HENT:     Head: Normocephalic and atraumatic.  Pulmonary:     Effort: Pulmonary effort is normal. No respiratory distress.  Neurological:     Mental Status: She is alert and oriented to person, place, and  time.  Psychiatric:        Mood and Affect: Mood is anxious.     Comments: Pt is very loquacious and demanding     Results for orders placed or performed during the hospital encounter of 02/24/19  TSH  Result Value Ref Range   TSH 9.694 (H) 0.350 - 4.500 uIU/mL  Lipid panel  Result Value Ref Range   Cholesterol 246 (H) 0 - 200 mg/dL   Triglycerides 188 (H) <150 mg/dL   HDL 45 >40 mg/dL   Total CHOL/HDL Ratio 5.5 RATIO   VLDL 38 0 - 40 mg/dL   LDL Cholesterol 163 (  H) 0 - 99 mg/dL  T4, free  Result Value Ref Range   Free T4 0.73 0.61 - 1.12 ng/dL  Comprehensive metabolic panel  Result Value Ref Range   Sodium 138 135 - 145 mmol/L   Potassium 3.7 3.5 - 5.1 mmol/L   Chloride 104 98 - 111 mmol/L   CO2 22 22 - 32 mmol/L   Glucose, Bld 149 (H) 70 - 99 mg/dL   BUN 18 6 - 20 mg/dL   Creatinine, Ser 5.17 0.44 - 1.00 mg/dL   Calcium 9.3 8.9 - 00.1 mg/dL   Total Protein 8.3 (H) 6.5 - 8.1 g/dL   Albumin 4.1 3.5 - 5.0 g/dL   AST 29 15 - 41 U/L   ALT 34 0 - 44 U/L   Alkaline Phosphatase 122 38 - 126 U/L   Total Bilirubin 0.5 0.3 - 1.2 mg/dL   GFR calc non Af Amer >60 >60 mL/min   GFR calc Af Amer >60 >60 mL/min   Anion gap 12 5 - 15  Hemoglobin A1c  Result Value Ref Range   Hgb A1c MFr Bld 6.5 (H) 4.8 - 5.6 %   Mean Plasma Glucose 139.85 mg/dL      Assessment & Plan:       Encounter Diagnoses  Name Primary?  . Essential hypertension Yes  . Hyperlipidemia, unspecified hyperlipidemia type   . Hypothyroidism, unspecified type   . Diabetes mellitus without complication (HCC)   . Obesity, unspecified classification, unspecified obesity type, unspecified whether serious comorbidity present   . Migraine without status migrainosus, not intractable, unspecified migraine type       -reviewed labs with pt  -Send glipizide and atorvastatin rx to [facility]  -will refer for Screening mammogram  -pt will be referred for diabetic eye exam  -suspect some mood disorder  contributing to patients multiple complaints / reported illnesses and difficulty with communicating with her in a meaningful way  -AVS will be mailed to pt  -pt to follow up 3 months.  She is to contact office sooner

## 2019-02-27 NOTE — Telephone Encounter (Signed)
JC viral antibody is negative, index value of 0.22.

## 2019-03-08 ENCOUNTER — Encounter: Payer: Self-pay | Admitting: Physician Assistant

## 2019-03-08 ENCOUNTER — Ambulatory Visit: Payer: Self-pay | Admitting: Neurology

## 2019-03-08 ENCOUNTER — Other Ambulatory Visit: Payer: Self-pay | Admitting: Physician Assistant

## 2019-03-08 DIAGNOSIS — Z1239 Encounter for other screening for malignant neoplasm of breast: Secondary | ICD-10-CM

## 2019-03-16 ENCOUNTER — Ambulatory Visit
Admission: RE | Admit: 2019-03-16 | Discharge: 2019-03-16 | Disposition: A | Discharge status: Home / Self Care | Payer: No Typology Code available for payment source | Source: Ambulatory Visit | Attending: Neurology | Admitting: Neurology

## 2019-03-16 ENCOUNTER — Other Ambulatory Visit: Payer: Self-pay

## 2019-03-16 ENCOUNTER — Ambulatory Visit
Admission: RE | Admit: 2019-03-16 | Discharge: 2019-03-16 | Disposition: A | Discharge status: Home / Self Care | Payer: Medicaid Other | Source: Ambulatory Visit | Attending: Neurology | Admitting: Neurology

## 2019-03-16 DIAGNOSIS — G35 Multiple sclerosis: Secondary | ICD-10-CM

## 2019-03-16 MED ORDER — GADOBENATE DIMEGLUMINE 529 MG/ML IV SOLN
20.0000 mL | Freq: Once | INTRAVENOUS | Status: AC | PRN
  Administered 2019-03-16: 20 mL via INTRAVENOUS

## 2019-03-19 ENCOUNTER — Telehealth: Payer: Self-pay | Admitting: Neurology

## 2019-03-19 DIAGNOSIS — R202 Paresthesia of skin: Secondary | ICD-10-CM

## 2019-03-19 DIAGNOSIS — R9089 Other abnormal findings on diagnostic imaging of central nervous system: Secondary | ICD-10-CM

## 2019-03-19 NOTE — Telephone Encounter (Signed)
I called the patient.  MRI of the brain that showed nonspecific white matter changes, the patient however has a several decade history of severe migraine headache which also could explain the white matter changes.  MRI of the cervical spine shows moderate spinal stenosis at C6-7 level without injury to the spinal cord.  I have indicated the patient that the diagnosis of multiple sclerosis is in question, we would consider lumbar puncture to look for oligoclonal banding to help confirm the diagnosis of multiple sclerosis.  The patient is amenable to this.  I will get a lumbar puncture set up.   MRI brain 03/17/19:  IMPRESSION:   This MRI of the brain with and without contrast shows the following:  1.   There are scattered T2/flair hyperintense foci in the white matter both hemispheres.  Two foci are periventricular and the rest are in the subcortical or deep white matter.  They do not enhance or appear to be acute.  This is a nonspecific finding and could be due to chronic microvascular ischemic change or demyelination.  Older images and not available for comparison. 2.    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 3.    There are no acute findings.    MRI cervical 03/17/19:  IMPRESSION: This MRI of the cervical spine with and without contrast shows the following: 1.    The spinal cord appears normal.  There is no evidence of demyelination 2.    At C5-C6, there is a disc protrusion causing mild spinal stenosis and mild foraminal narrowing.  There is no nerve root compression. 3.    At C6-C7, there is a right paramedian disc herniation causing moderate spinal stenosis and moderate right foraminal narrowing with potential for right C7 nerve root impingement. 4.    At C7-T1 there is a mild left paramedian disc protrusion causing mild spinal stenosis but no foraminal narrowing or nerve root compression. 5.    There is a normal enhancement pattern.

## 2019-03-22 ENCOUNTER — Ambulatory Visit (HOSPITAL_COMMUNITY)
Admission: RE | Admit: 2019-03-22 | Discharge: 2019-03-22 | Disposition: A | Discharge status: Home / Self Care | Payer: Self-pay | Source: Ambulatory Visit | Attending: Physician Assistant | Admitting: Physician Assistant

## 2019-03-22 ENCOUNTER — Other Ambulatory Visit: Payer: Self-pay

## 2019-03-22 ENCOUNTER — Encounter (HOSPITAL_COMMUNITY): Payer: Self-pay

## 2019-03-22 DIAGNOSIS — Z1239 Encounter for other screening for malignant neoplasm of breast: Secondary | ICD-10-CM

## 2019-03-24 ENCOUNTER — Other Ambulatory Visit: Payer: Self-pay

## 2019-03-24 ENCOUNTER — Ambulatory Visit
Admission: EM | Admit: 2019-03-24 | Discharge: 2019-03-24 | Disposition: A | Discharge status: Home / Self Care | Payer: Medicaid Other

## 2019-03-24 ENCOUNTER — Emergency Department (HOSPITAL_COMMUNITY): Payer: Self-pay

## 2019-03-24 ENCOUNTER — Emergency Department (HOSPITAL_COMMUNITY)
Admission: EM | Admit: 2019-03-24 | Discharge: 2019-03-24 | Disposition: A | Discharge status: Home / Self Care | Payer: Self-pay | Attending: Emergency Medicine | Admitting: Emergency Medicine

## 2019-03-24 ENCOUNTER — Encounter (HOSPITAL_COMMUNITY): Payer: Self-pay | Admitting: *Deleted

## 2019-03-24 DIAGNOSIS — E1165 Type 2 diabetes mellitus with hyperglycemia: Secondary | ICD-10-CM | POA: Insufficient documentation

## 2019-03-24 DIAGNOSIS — G35 Multiple sclerosis: Secondary | ICD-10-CM | POA: Insufficient documentation

## 2019-03-24 DIAGNOSIS — E039 Hypothyroidism, unspecified: Secondary | ICD-10-CM | POA: Insufficient documentation

## 2019-03-24 DIAGNOSIS — I10 Essential (primary) hypertension: Secondary | ICD-10-CM | POA: Insufficient documentation

## 2019-03-24 DIAGNOSIS — J45909 Unspecified asthma, uncomplicated: Secondary | ICD-10-CM | POA: Insufficient documentation

## 2019-03-24 DIAGNOSIS — R002 Palpitations: Secondary | ICD-10-CM | POA: Insufficient documentation

## 2019-03-24 DIAGNOSIS — R739 Hyperglycemia, unspecified: Secondary | ICD-10-CM

## 2019-03-24 DIAGNOSIS — Z79899 Other long term (current) drug therapy: Secondary | ICD-10-CM | POA: Insufficient documentation

## 2019-03-24 LAB — CBC WITH DIFFERENTIAL/PLATELET
Abs Immature Granulocytes: 0.02 10*3/uL (ref 0.00–0.07)
Basophils Absolute: 0 10*3/uL (ref 0.0–0.1)
Basophils Relative: 0 %
Eosinophils Absolute: 0.3 10*3/uL (ref 0.0–0.5)
Eosinophils Relative: 4 %
HCT: 40.7 % (ref 36.0–46.0)
Hemoglobin: 13.1 g/dL (ref 12.0–15.0)
Immature Granulocytes: 0 %
Lymphocytes Relative: 26 %
Lymphs Abs: 1.8 10*3/uL (ref 0.7–4.0)
MCH: 29.4 pg (ref 26.0–34.0)
MCHC: 32.2 g/dL (ref 30.0–36.0)
MCV: 91.3 fL (ref 80.0–100.0)
Monocytes Absolute: 0.4 10*3/uL (ref 0.1–1.0)
Monocytes Relative: 5 %
Neutro Abs: 4.6 10*3/uL (ref 1.7–7.7)
Neutrophils Relative %: 65 %
Platelets: 315 10*3/uL (ref 150–400)
RBC: 4.46 MIL/uL (ref 3.87–5.11)
RDW: 12.7 % (ref 11.5–15.5)
WBC: 7.1 10*3/uL (ref 4.0–10.5)
nRBC: 0 % (ref 0.0–0.2)

## 2019-03-24 LAB — COMPREHENSIVE METABOLIC PANEL
ALT: 33 U/L (ref 0–44)
AST: 30 U/L (ref 15–41)
Albumin: 4 g/dL (ref 3.5–5.0)
Alkaline Phosphatase: 107 U/L (ref 38–126)
Anion gap: 10 (ref 5–15)
BUN: 15 mg/dL (ref 6–20)
CO2: 22 mmol/L (ref 22–32)
Calcium: 8.9 mg/dL (ref 8.9–10.3)
Chloride: 105 mmol/L (ref 98–111)
Creatinine, Ser: 0.78 mg/dL (ref 0.44–1.00)
GFR calc Af Amer: >60 mL/min (ref >60)
GFR calc non Af Amer: >60 mL/min (ref >60)
Glucose, Bld: 141 mg/dL — ABNORMAL HIGH (ref 70–99)
Potassium: 3.9 mmol/L (ref 3.5–5.1)
Sodium: 137 mmol/L (ref 135–145)
Total Bilirubin: 0.4 mg/dL (ref 0.3–1.2)
Total Protein: 8 g/dL (ref 6.5–8.1)

## 2019-03-24 LAB — TSH: TSH: 1.734 u[IU]/mL (ref 0.350–4.500)

## 2019-03-24 LAB — TROPONIN I (HIGH SENSITIVITY): Troponin I (High Sensitivity): <2 ng/L (ref <18)

## 2019-03-24 IMAGING — DX CHEST - 2 VIEW
2 series · 2 of 2 positions shown · non-contrast
Comparison: None.

CLINICAL DATA: Chest pain with cardiac palpitations

EXAM:
CHEST - 2 VIEW

[chest pa]
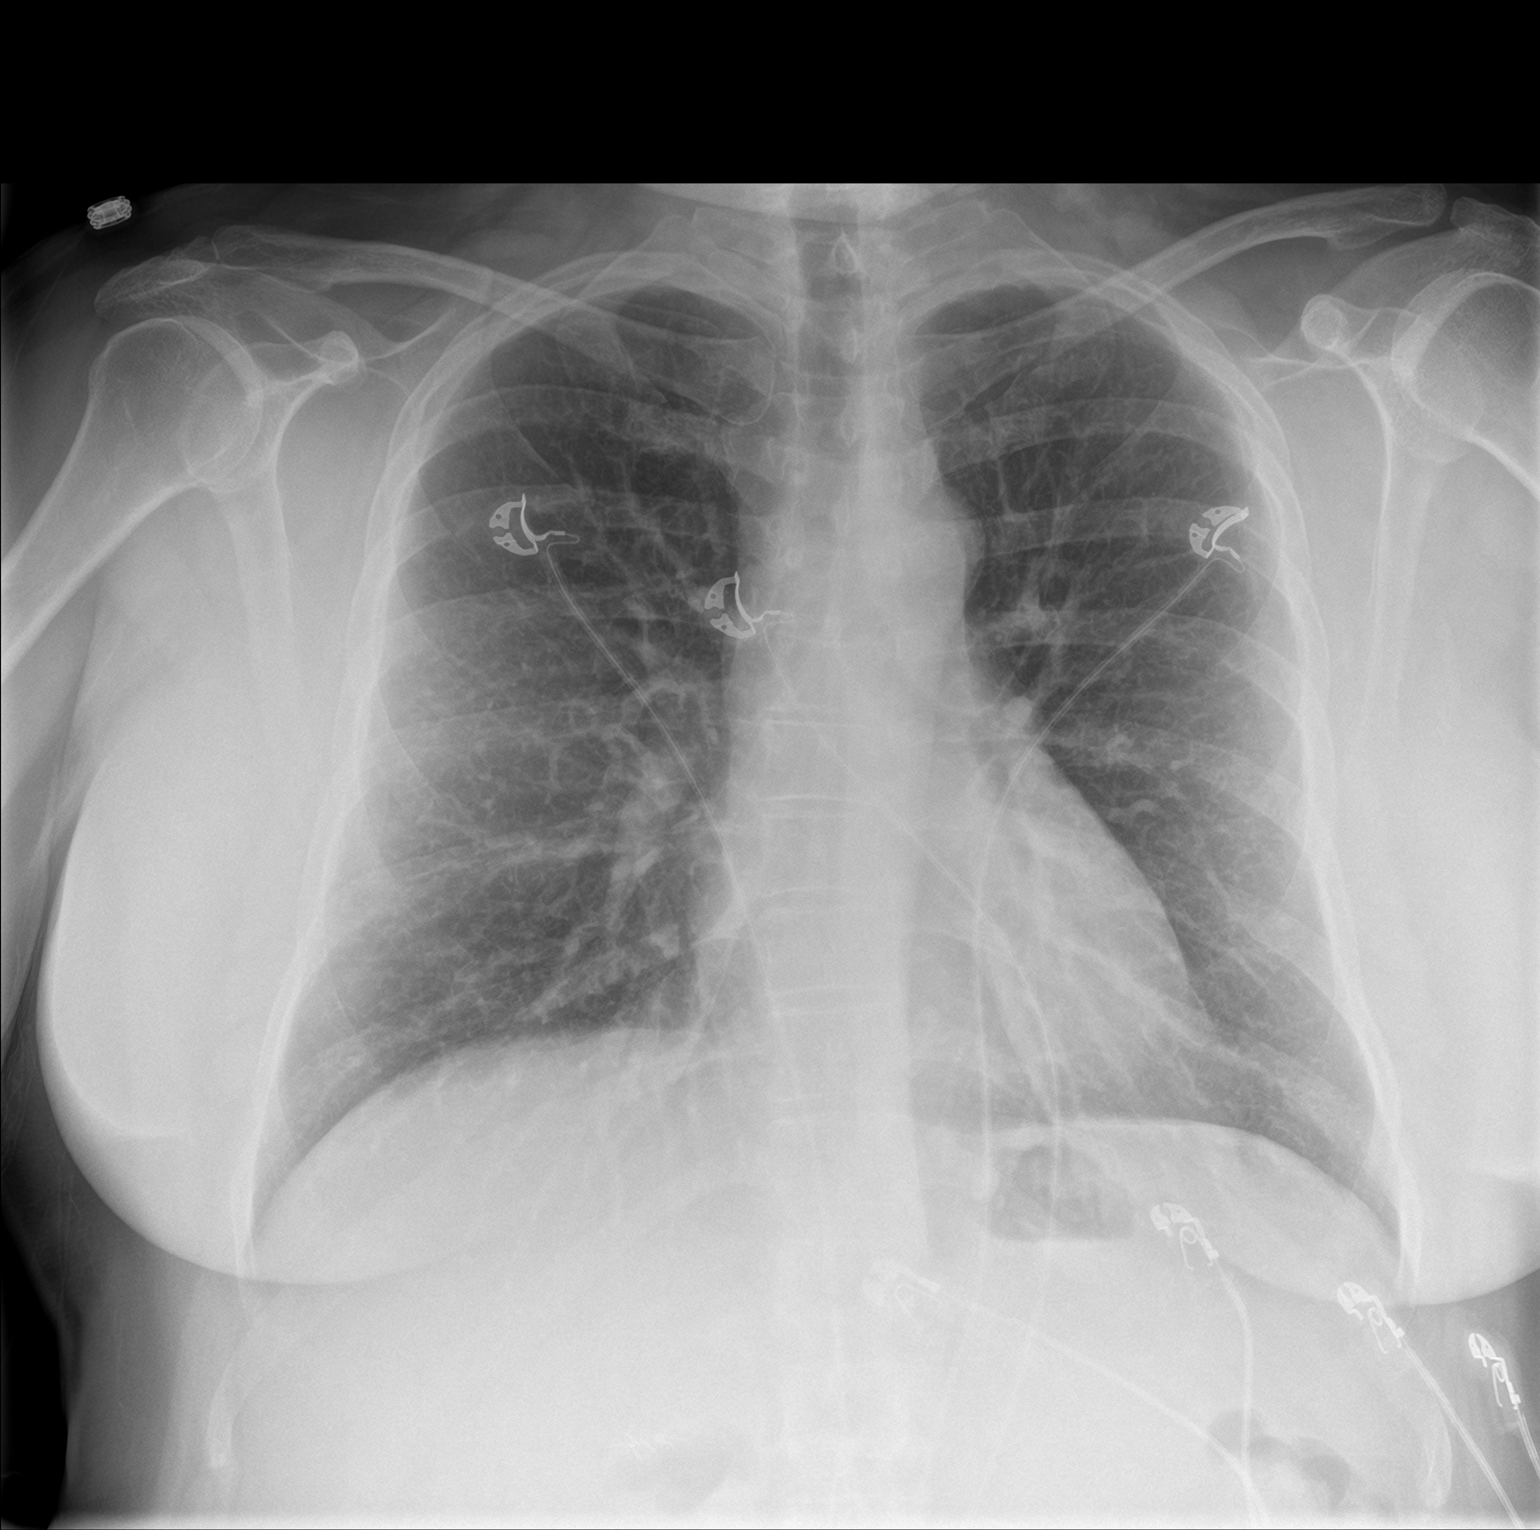

[chest lat]
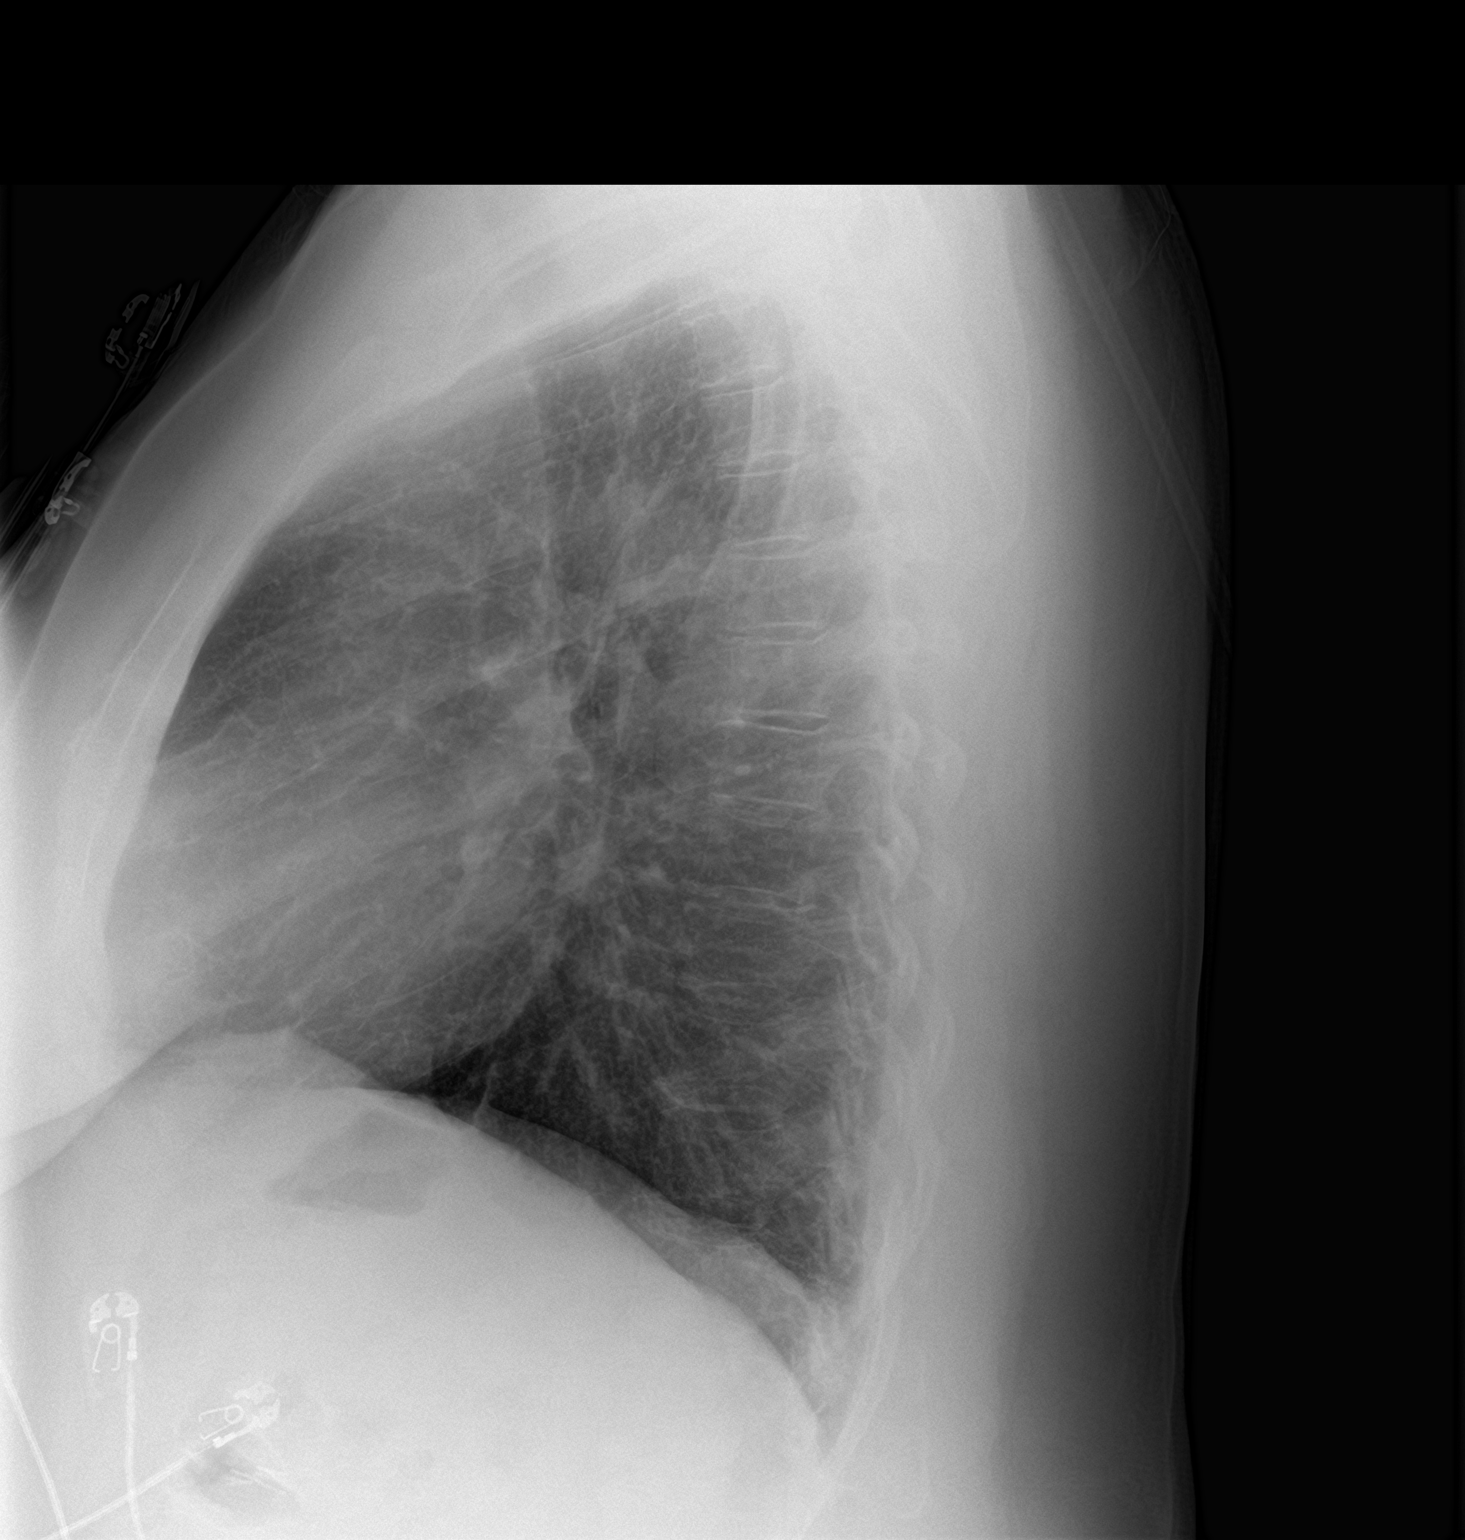

[2 of 2 positions shown; findings below may reference images not displayed]

FINDINGS: Lungs are clear. Heart size and pulmonary vascularity are normal. No
adenopathy. No pneumothorax. No bone lesions.
IMPRESSION: No edema or consolidation.

## 2019-03-24 NOTE — ED Notes (Signed)
Advised to return for any change in symptoms

## 2019-03-24 NOTE — Discharge Instructions (Signed)
Your testing today has been very normal except for your blood sugar which was only minimally elevated.  This does not need to be treated today, you do not need to start on new medications until you follow-up with your family doctor.  You should start taking a daily aspirin, 325 mg.  You should also follow-up with the heart doctor as you will likely need to have another Holter monitor test to track your heart.  I have given you the phone number for the heart doctor as well as your family doctor above and will leave you a list of family doctors in the community below if you would like to change your family doctor.  Please seek medical exam in emergency department for severe or worsening symptoms.  [facility] Primary Care Doctor List    Sinda Du MD. Specialty: Pulmonary Disease Contact information: [place]  [place] [address]  [phone]   Tula Nakayama, MD. Specialty: [facility] Medicine Contact information: [address] [place] [address]  [phone]   Sallee Lange, MD. Specialty: [facility] Medicine Contact information: [place]  [place] [address]  [phone]   Rosita Fire, MD Specialty: Internal Medicine Contact information: [place] [address]  [phone]   Delphina Cahill, MD. Specialty: Internal Medicine Contact information: [place] [address]  [phone]    [facility] (Dr. Maudie Mercury) Specialty: Family Medicine Contact information: [place] [address]  [phone]   Leslie Andrea, MD. Specialty: [facility] Medicine Contact information: [place] [address]  [phone]   Asencion Noble, MD. Specialty: Internal Medicine Contact information: [address]  [place] [address]  [place]  [address] [place], [place]  [address] [phone]  Services [facility] offers a variety of basic health services.  Services include but are not limited to: Blood pressure checks  Heart rate checks  Blood sugar checks  Urine analysis  Rapid strep tests  Pregnancy tests.  Health education and referrals  People needing more complex services will be directed to a physician online. Using these virtual visits, doctors can evaluate and prescribe medicine and treatments. There will be no medication on-site, though [facility] will help patients fill their prescriptions at little to no cost.   For More information please go to: [email or web address]

## 2019-03-24 NOTE — ED Provider Notes (Signed)
[facility] EMERGENCY DEPARTMENT Provider Note   CSN: [id number] Arrival date & time: 03/24/19  1037    History   Chief Complaint Chief Complaint  Patient presents with  . Chest Pain    HPI Amy Lester is a 48 y.o. female.     HPI  The patient is a 48 year old female, she has a history of migraines, reports a history of hypertension, diabetes which is currently untreated and a history of hypercholesterolemia.  She also carries a diagnosis of multiple sclerosis and most recently had an MRI 1 week ago showing minimal if any disease.  She states that she saw a cardiologist in [place] when she lives there however she moved here several months ago and since that time has not established care with a cardiologist locally.  [place] as well as some type of monitoring for possible heart palpitations of some kind.  She reports wearing a Holter monitor but never had a specific diagnosis or treatment plan.  She was told that she may have some coronary disease but nothing that needed intervention, she denies having stress test or heart catheterizations in the past.  She reports today that she had both yesterday and today developed a feeling of palpitations or throbbing in her chest, minimal chest discomfort with it, lasted less than 10 minutes, came on at rest.  She is not currently having the symptoms, she denies nausea vomiting diaphoresis or shortness of breath with this and has not had fevers chills or swelling of the legs.  The patient is endorsing not taking any diabetic medications as she does not have a local family doctor.  Interestingly my review of the medical record shows that she has been seen at the [facility] clinic multiple times since March for hypertension.  The patient is asymptomatic at this time. She does endorse compliance with her blood pressure and cholesterol medications.  Past Medical History:  Diagnosis  Date  . Asthma   . Chronic migraine   . Common migraine with intractable migraine 02/16/2019  . Diabetes mellitus without complication (HCC)   . Endometriosis   . Fatty liver   . GERD (gastroesophageal reflux disease)   . Hypertension   . Hypothyroidism   . Incontinence   . Irregular heartbeat   . Multiple sclerosis (HCC)   . Palpitations   . Sleep apnea     Patient Active Problem List   Diagnosis Date Noted  . Intractable chronic migraine without aura 02/16/2019  . MS (multiple sclerosis) (HCC) 02/16/2019    Past Surgical History:  Procedure Laterality Date  . ABDOMINAL HYSTERECTOMY    . CESAREAN SECTION     x1  . CHOLECYSTECTOMY    . ENDOMETRIAL ABLATION    . FOOT SURGERY Left 06/2018   foot reconstruction     OB History   No obstetric history on file.      Home Medications    Prior to Admission medications   Medication Sig Start Date End Date Taking? Authorizing Provider  liothyronine (CYTOMEL) 5 MCG tablet Take 5 mcg by mouth daily.   Yes [provider]  albuterol (PROVENTIL HFA;VENTOLIN HFA) 108 (90 Base) MCG/ACT inhaler Inhale 3 puffs into the lungs every 6 (six) hours as needed for wheezing or shortness of breath. 11/30/18   Jacquelin HawkingMcElroy, Shannon, PA-C  atorvastatin (LIPITOR) 10 MG tablet Take 1 tablet (10 mg total) by mouth daily. 02/27/19   Jacquelin HawkingMcElroy, Shannon, PA-C  cetirizine (  ZYRTEC) 10 MG tablet Take 1 tablet (10 mg total) by mouth daily. 11/30/18   Jacquelin Hawking, PA-C  clotrimazole-betamethasone (LOTRISONE) cream Apply 1 application topically as needed.    [provider]  estradiol (EVAMIST) 1.53 MG/SPRAY transdermal spray Place 3 sprays onto the skin daily.    [provider]  glipiZIDE (GLUCOTROL) 5 MG tablet Take 1 tablet (5 mg total) by mouth daily before breakfast. 02/27/19   Jacquelin Hawking, PA-C  hydrochlorothiazide (HYDRODIURIL) 25 MG tablet Take 25 mg by mouth daily.    [provider]  HYDROCODONE-ACETAMINOPHEN  PO Take 1 tablet by mouth as needed.    [provider]  ketorolac (TORADOL) 10 MG tablet Take 1 tablet (10 mg total) by mouth every 8 (eight) hours as needed. Patient not taking: Reported on 02/27/2019 02/16/19   York Spaniel, MD  levothyroxine (SYNTHROID) 200 MCG tablet Take 1 tablet (200 mcg total) by mouth daily before breakfast. 02/27/19   Jacquelin Hawking, PA-C  levothyroxine (SYNTHROID) 25 MCG tablet Take 1 tablet (25 mcg total) by mouth daily before breakfast. 02/27/19   Jacquelin Hawking, PA-C  lisinopril (ZESTRIL) 10 MG tablet Take 1 tablet (10 mg total) by mouth daily. 02/27/19   Jacquelin Hawking, PA-C  metoprolol tartrate (LOPRESSOR) 100 MG tablet Take 1 tablet (100 mg total) by mouth 2 (two) times daily. 02/27/19   Jacquelin Hawking, PA-C  metroNIDAZOLE (METROGEL) 1 % gel Apply 1 application topically daily.    [provider]  Multiple Vitamins-Minerals (MULTIVITAMIN WITH MINERALS) tablet Take 1 tablet by mouth daily.    [provider]  nystatin cream (MYCOSTATIN) Apply 1 application topically daily as needed for dry skin.    [provider]  Olopatadine HCl (PAZEO) 0.7 % SOLN Apply to eye every morning.    [provider]  omeprazole (PRILOSEC) 40 MG capsule Take 1 capsule (40 mg total) by mouth daily. 11/30/18   Jacquelin Hawking, PA-C  ondansetron (ZOFRAN) 8 MG tablet Take 8 mg by mouth as needed for nausea or vomiting.    [provider]  QUEtiapine (SEROQUEL) 25 MG tablet 1 tablet at night for 2 weeks, then take 2 tablets at night Patient not taking: Reported on 02/27/2019 02/22/19   York Spaniel, MD  Red Yeast Rice Extract (RED YEAST RICE PO) Take 1 tablet by mouth daily.     [provider]  Riboflavin (VITAMIN B-2 PO) Take by mouth.    [provider]  Rizatriptan Benzoate (MAXALT PO) Take 1 tablet by mouth as needed (Headaches).    [provider]  TRETINOIN EX Apply topically daily.    [provider]  triamcinolone cream (KENALOG) 0.1 % Apply 1 application topically 2 (two) times daily as needed. 12/19/18   Jacquelin Hawking, PA-C  UNABLE TO FIND Med Name: HZTZ 37.5 mg    [provider]    Family History Family History  Problem Relation Age of Onset  . Heart attack Mother   . Stroke Mother   . Cancer Mother        breast cancer  . Hyperthyroidism Mother   . Hyperlipidemia Mother   . Cancer Father        lymph nodes, liver cancer  . Other Daughter        Fine Gold Type II Syndrome and cognetive issues  . Memory loss Daughter   . Other Son        Fine Gold Syndrome and BPES    Social  History Social History   Tobacco Use  . Smoking status: Never Smoker  . Smokeless tobacco: Never Used  Substance Use Topics  . Alcohol use: Yes    Frequency: Never    Comment: once a year  . Drug use: Never     Allergies   Percocet [oxycodone-acetaminophen]   Review of Systems Review of Systems  All other systems reviewed and are negative.    Physical Exam Updated Vital Signs BP 134/83   Pulse 71   Resp 15   Ht 1.626 m (5\' 4" )   Wt 100 kg   SpO2 97%   BMI 37.84 kg/m   Physical Exam Vitals signs and nursing note reviewed.  Constitutional:      General: She is not in acute distress.    Appearance: She is well-developed.  HENT:     Head: Normocephalic and atraumatic.     Mouth/Throat:     Pharynx: No oropharyngeal exudate.  Eyes:     General: No scleral icterus.       Right eye: No discharge.        Left eye: No discharge.     Conjunctiva/sclera: Conjunctivae normal.     Pupils: Pupils are equal, round, and reactive to light.  Neck:     Musculoskeletal: Normal range of motion and neck supple.     Thyroid: No thyromegaly.     Vascular: No JVD.  Cardiovascular:     Rate and Rhythm: Normal rate and regular rhythm.     Heart sounds: Normal heart sounds. No murmur. No friction rub. No gallop.   Pulmonary:     Effort: Pulmonary effort is  normal. No respiratory distress.     Breath sounds: Normal breath sounds. No wheezing or rales.  Abdominal:     General: Bowel sounds are normal. There is no distension.     Palpations: Abdomen is soft. There is no mass.     Tenderness: There is no abdominal tenderness.  Musculoskeletal: Normal range of motion.        General: No tenderness.  Lymphadenopathy:     Cervical: No cervical adenopathy.  Skin:    General: Skin is warm and dry.     Findings: No erythema or rash.  Neurological:     Mental Status: She is alert.     Coordination: Coordination normal.  Psychiatric:        Behavior: Behavior normal.      ED Treatments / Results  Labs (all labs ordered are listed, but only abnormal results are displayed) Labs Reviewed  COMPREHENSIVE METABOLIC PANEL - Abnormal; Notable for the following components:      Result Value   Glucose, Bld 141 (*)    All other components within normal limits  CBC WITH DIFFERENTIAL/PLATELET  TSH  TROPONIN I (HIGH SENSITIVITY)  TROPONIN I (HIGH SENSITIVITY)    EKG EKG Interpretation  Date/Time:  Friday March 24 2019 10:47:53 EDT Ventricular Rate:  67 PR Interval:    QRS Duration: 91 QT Interval:  435 QTC Calculation: 460 R Axis:   72 Text Interpretation:  Sinus rhythm Normal ECG No old tracing to compare Confirmed by Eber HongMiller, Lakhia Gengler ([id number]) on 03/24/2019 11:02:07 AM   Radiology Dg Chest 2 View  Result Date: 03/24/2019 CLINICAL DATA:  Chest pain with cardiac palpitations EXAM: CHEST - 2 VIEW COMPARISON:  None. FINDINGS: Lungs are clear. Heart size and pulmonary vascularity are normal. No adenopathy. No pneumothorax. No bone lesions. IMPRESSION: No edema or consolidation. Electronically Signed  By: Lowella Grip III M.D.   On: 03/24/2019 12:04    Procedures Procedures (including critical care time)  Medications Ordered in ED Medications - No data to display   Initial Impression / Assessment and Plan / ED Course  I have reviewed  the triage vital signs and the nursing notes.  Pertinent labs & imaging results that were available during my care of the patient were reviewed by me and considered in my medical decision making (see chart for details).        The patient's exam is totally unremarkable, she has no ectopy on the monitor, she has a totally normal EKG without any signs of ischemia, she has, risk for coronary syndrome but has a heart pathway score of 3, she has no active symptoms and with a normal EKG a single troponin will be enough during this age of high-sensitivity troponin.  She will need to follow-up with cardiology most likely for further Holter monitor testing.  Labs will be drawn to evaluate her blood sugar and establish the need for initiation of medication for that dysfunction.  That being said her vital signs are unremarkable and she appears well at this time.  Mildly hyperglycemic but not in need of acute treatment, no signs of diabetic ketoacidosis.  Rest of her labs look normal including the troponin, renal function, TSH and her chest x-ray.  At this time the patient has been given reassurance that this is unlikely an acute coronary syndrome, referred to both cardiology locally, family doctors locally and given return instructions to which she has agreed.  Stable for discharge, will like her to start taking aspirin.  Final Clinical Impressions(s) / ED Diagnoses   Final diagnoses:  Palpitations  Hyperglycemia     Noemi Chapel, MD 03/24/19 1235

## 2019-03-24 NOTE — ED Triage Notes (Signed)
Met with pt in lobby, no active chest pain or dizziness, occurred last night. Pt has cardiac history and is from [place]. After conversation with pt and husband, decided to go to ED for further work up. Pt is not SOB

## 2019-03-24 NOTE — ED Triage Notes (Signed)
Patient presents to the ED after being sent from urgent care in [place], complaining of intermittent chest pain for 3 days.  Patient complains of dizziness, denies nausea, vomiting.

## 2019-03-27 ENCOUNTER — Ambulatory Visit (INDEPENDENT_AMBULATORY_CARE_PROVIDER_SITE_OTHER): Payer: Self-pay | Admitting: Orthopedic Surgery

## 2019-03-27 ENCOUNTER — Encounter: Payer: Self-pay | Admitting: Orthopedic Surgery

## 2019-03-27 ENCOUNTER — Other Ambulatory Visit: Payer: Self-pay

## 2019-03-27 ENCOUNTER — Ambulatory Visit (INDEPENDENT_AMBULATORY_CARE_PROVIDER_SITE_OTHER): Payer: Medicaid Other

## 2019-03-27 ENCOUNTER — Ambulatory Visit: Payer: Medicaid Other

## 2019-03-27 VITALS — BP 132/94 | HR 84 | Temp 97.1°F | Ht 64.0 in | Wt 220.0 lb

## 2019-03-27 DIAGNOSIS — M25571 Pain in right ankle and joints of right foot: Secondary | ICD-10-CM

## 2019-03-27 NOTE — Progress Notes (Signed)
Amy Lester  03/27/2019  HISTORY SECTION :  Chief Complaint  Patient presents with  . Foot Pain    right foot, has pain, history of MS states the Left foot was reconstructed and now Right foot is painful    47 history of multiple sclerosis status post left foot/ankle reconstruction at [facility] presents complaining that something is flopping around inside of her right ankle and feels like it did on the left side years ago before the left ankle was repaired  Her pain is lateral in the subtalar region pain for greater than 6 weeks quality is dull associated with difficulty walking ankle swelling and stiffness   Review of Systems  Constitutional: Positive for diaphoresis and malaise/fatigue.  Eyes: Positive for photophobia.  Gastrointestinal: Positive for heartburn and nausea.  Musculoskeletal: Positive for back pain and neck pain.  Neurological: Positive for weakness.     Past Medical History:  Diagnosis Date  . Asthma   . Chronic migraine   . Common migraine with intractable migraine 02/16/2019  . Diabetes mellitus without complication ([place])   . Endometriosis   . Fatty liver   . GERD (gastroesophageal reflux disease)   . Hypertension   . Hypothyroidism   . Incontinence   . Irregular heartbeat   . Multiple sclerosis ([place])   . Palpitations   . Sleep apnea     Past Surgical History:  Procedure Laterality Date  . ABDOMINAL HYSTERECTOMY    . CESAREAN SECTION     x1  . CHOLECYSTECTOMY    . ENDOMETRIAL ABLATION    . FOOT SURGERY Left 06/2018   foot reconstruction     Allergies  Allergen Reactions  . Percocet [Oxycodone-Acetaminophen] Nausea And Vomiting     Current Outpatient Medications:  .  albuterol (PROVENTIL HFA;VENTOLIN HFA) 108 (90 Base) MCG/ACT inhaler, Inhale 3 puffs into the lungs every 6 (six) hours as needed for wheezing or shortness of breath., Disp: 3 Inhaler, Rfl: 0 .  atorvastatin (LIPITOR) 10 MG tablet, Take 1 tablet (10 mg total) by  mouth daily., Disp: 90 tablet, Rfl: 1 .  cetirizine (ZYRTEC) 10 MG tablet, Take 1 tablet (10 mg total) by mouth daily., Disp: 90 tablet, Rfl: 0 .  clotrimazole-betamethasone (LOTRISONE) cream, Apply 1 application topically as needed., Disp: , Rfl:  .  estradiol (EVAMIST) 1.53 MG/SPRAY transdermal spray, Place 3 sprays onto the skin daily., Disp: , Rfl:  .  glipiZIDE (GLUCOTROL) 5 MG tablet, Take 1 tablet (5 mg total) by mouth daily before breakfast., Disp: 90 tablet, Rfl: 1 .  hydrochlorothiazide (HYDRODIURIL) 25 MG tablet, Take 25 mg by mouth daily., Disp: , Rfl:  .  HYDROCODONE-ACETAMINOPHEN PO, Take 1 tablet by mouth as needed., Disp: , Rfl:  .  ketorolac (TORADOL) 10 MG tablet, Take 1 tablet (10 mg total) by mouth every 8 (eight) hours as needed., Disp: 30 tablet, Rfl: 1 .  levothyroxine (SYNTHROID) 200 MCG tablet, Take 1 tablet (200 mcg total) by mouth daily before breakfast., Disp: 90 tablet, Rfl: 0 .  levothyroxine (SYNTHROID) 25 MCG tablet, Take 1 tablet (25 mcg total) by mouth daily before breakfast., Disp: 90 tablet, Rfl: 0 .  liothyronine (CYTOMEL) 5 MCG tablet, Take 5 mcg by mouth daily., Disp: , Rfl:  .  lisinopril (ZESTRIL) 10 MG tablet, Take 1 tablet (10 mg total) by mouth daily., Disp: 90 tablet, Rfl: 0 .  metoprolol tartrate (LOPRESSOR) 100 MG tablet, Take 1 tablet (100 mg total) by mouth 2 (two) times daily., Disp:  180 tablet, Rfl: 1 .  metroNIDAZOLE (METROGEL) 1 % gel, Apply 1 application topically daily., Disp: , Rfl:  .  Multiple Vitamins-Minerals (MULTIVITAMIN WITH MINERALS) tablet, Take 1 tablet by mouth daily., Disp: , Rfl:  .  nystatin cream (MYCOSTATIN), Apply 1 application topically daily as needed for dry skin., Disp: , Rfl:  .  Olopatadine HCl (PAZEO) 0.7 % SOLN, Apply to eye every morning., Disp: , Rfl:  .  omeprazole (PRILOSEC) 40 MG capsule, Take 1 capsule (40 mg total) by mouth daily., Disp: 90 capsule, Rfl: 0 .  ondansetron (ZOFRAN) 8 MG tablet, Take 8 mg by mouth  as needed for nausea or vomiting., Disp: , Rfl:  .  QUEtiapine (SEROQUEL) 25 MG tablet, 1 tablet at night for 2 weeks, then take 2 tablets at night, Disp: 60 tablet, Rfl: 3 .  Red Yeast Rice Extract (RED YEAST RICE PO), Take 1 tablet by mouth daily. , Disp: , Rfl:  .  Riboflavin (VITAMIN B-2 PO), Take by mouth., Disp: , Rfl:  .  Rizatriptan Benzoate (MAXALT PO), Take 1 tablet by mouth as needed (Headaches)., Disp: , Rfl:  .  TRETINOIN EX, Apply topically daily., Disp: , Rfl:  .  triamcinolone cream (KENALOG) 0.1 %, Apply 1 application topically 2 (two) times daily as needed., Disp: 45 g, Rfl: 0 .  UNABLE TO FIND, Med Name: HZTZ 37.5 mg, Disp: , Rfl:    PHYSICAL EXAM SECTION: 1) BP (!) 132/94   Pulse 84   Ht 5\' 4"  (1.626 m)   Wt 220 lb (99.8 kg)   BMI 37.76 kg/m   Body mass index is 37.76 kg/m. General appearance: Well-developed well-nourished no gross deformities  2) Cardiovascular normal pulse and perfusion in both lower extremities normal color without edema  3) Neurologically deep tendon reflexes are equal and normal, no sensation loss or deficits no pathologic reflexes  4) Psychological: Awake alert and oriented x3 mood and affect normal  5) Skin no lacerations or ulcerations no nodularity no palpable masses, no erythema or nodularity  6) Musculoskeletal:   Left ankle tightness on dorsiflexion she has a medial long incision over the medial side of the joint extending up the tibia and then the lateral incision it appears to be in the subtalar region  On the right side she has tenderness in the subtalar joint decreased range of motion ankle is stable foot is plantigrade she has tightness on dorsiflexion no motor weakness is noted   MEDICAL DECISION SECTION:  Encounter Diagnoses  Name Primary?  . Pain in right ankle and joints of right foot Yes  . Morbid obesity (HCC)     Imaging Ankle x-ray //  foot x-ray: No major abnormalities noted except for plantar spur and a  calcaneal spur some midfoot arthritis.  No evidence of significant changes there.  Plan:  (Rx., Inj., surg., Frx, MRI/CT, XR:2)  Fu 6 weeks   aso brace  PT    9:11 AM

## 2019-03-28 ENCOUNTER — Encounter: Payer: Self-pay | Admitting: Cardiology

## 2019-03-28 ENCOUNTER — Ambulatory Visit (INDEPENDENT_AMBULATORY_CARE_PROVIDER_SITE_OTHER): Payer: Self-pay | Admitting: Cardiology

## 2019-03-28 ENCOUNTER — Other Ambulatory Visit: Payer: Self-pay | Admitting: Physician Assistant

## 2019-03-28 VITALS — BP 125/86 | HR 74 | Ht 64.0 in | Wt 224.0 lb

## 2019-03-28 DIAGNOSIS — I10 Essential (primary) hypertension: Secondary | ICD-10-CM

## 2019-03-28 DIAGNOSIS — R002 Palpitations: Secondary | ICD-10-CM

## 2019-03-28 MED ORDER — OMEPRAZOLE 40 MG PO CPDR: 40.0000 mg | DELAYED_RELEASE_CAPSULE | Freq: Every day | ORAL | 0 refills | Status: DC

## 2019-03-28 MED ORDER — TRIAMCINOLONE ACETONIDE 0.1 % EX CREA: 1.0000 "application " | TOPICAL_CREAM | Freq: Two times a day (BID) | CUTANEOUS | 0 refills | Status: AC | PRN

## 2019-03-28 MED ORDER — ALBUTEROL SULFATE HFA 108 (90 BASE) MCG/ACT IN AERS: 3.0000 | INHALATION_SPRAY | Freq: Four times a day (QID) | RESPIRATORY_TRACT | 1 refills | Status: DC | PRN

## 2019-03-28 MED ORDER — CETIRIZINE HCL 10 MG PO TABS: 10.0000 mg | ORAL_TABLET | Freq: Every day | ORAL | 0 refills | Status: AC

## 2019-03-28 NOTE — Progress Notes (Signed)
Clinical Summary Amy Lester is a 48 y.o.female seen as new patient for the following medical problems  1. Palpitations - seen in ER with episode of palpitations. Occurred while laying in bed. Recent increase in symptoms. Heavy hard beats. Symptoms lasted about 10 minutes. Felt weak all over. In general episodes occur few times a week.  - rare cafffeine, no EtOH.  - followed previously in [place]. Reports wearing previous holter monitors - EKG SR in ER. TSH 1.7. hstrop neg, K 3.9  - last monitor 2-3 years ago she believes. She thinks overall benign.  - has been on lopressor 100mg  bid  - can occur exertion - fairly sedentary lifestyle.    2. Hyperlipidemia - compliant with statin   3. LE edema - primarily left sided, she has had prior left ankle surgery - has been on HCTZ - had echo in [place]   . Past Medical History:  Diagnosis Date  . Asthma   . Chronic migraine   . Common migraine with intractable migraine 02/16/2019  . Diabetes mellitus without complication (HCC)   . Endometriosis   . Fatty liver   . GERD (gastroesophageal reflux disease)   . Hypertension   . Hypothyroidism   . Incontinence   . Irregular heartbeat   . Multiple sclerosis (HCC)   . Palpitations   . Sleep apnea      Allergies  Allergen Reactions  . Percocet [Oxycodone-Acetaminophen] Nausea And Vomiting     Current Outpatient Medications  Medication Sig Dispense Refill  . albuterol (PROVENTIL HFA;VENTOLIN HFA) 108 (90 Base) MCG/ACT inhaler Inhale 3 puffs into the lungs every 6 (six) hours as needed for wheezing or shortness of breath. 3 Inhaler 0  . atorvastatin (LIPITOR) 10 MG tablet Take 1 tablet (10 mg total) by mouth daily. 90 tablet 1  . cetirizine (ZYRTEC) 10 MG tablet Take 1 tablet (10 mg total) by mouth daily. 90 tablet 0  . clotrimazole-betamethasone (LOTRISONE) cream Apply 1 application topically as needed.    Marland Kitchen estradiol (EVAMIST) 1.53 MG/SPRAY transdermal spray Place 3  sprays onto the skin daily.    Marland Kitchen glipiZIDE (GLUCOTROL) 5 MG tablet Take 1 tablet (5 mg total) by mouth daily before breakfast. 90 tablet 1  . hydrochlorothiazide (HYDRODIURIL) 25 MG tablet Take 25 mg by mouth daily.    Marland Kitchen HYDROCODONE-ACETAMINOPHEN PO Take 1 tablet by mouth as needed.    Marland Kitchen ketorolac (TORADOL) 10 MG tablet Take 1 tablet (10 mg total) by mouth every 8 (eight) hours as needed. 30 tablet 1  . levothyroxine (SYNTHROID) 200 MCG tablet Take 1 tablet (200 mcg total) by mouth daily before breakfast. 90 tablet 0  . levothyroxine (SYNTHROID) 25 MCG tablet Take 1 tablet (25 mcg total) by mouth daily before breakfast. 90 tablet 0  . liothyronine (CYTOMEL) 5 MCG tablet Take 5 mcg by mouth daily.    Marland Kitchen lisinopril (ZESTRIL) 10 MG tablet Take 1 tablet (10 mg total) by mouth daily. 90 tablet 0  . metoprolol tartrate (LOPRESSOR) 100 MG tablet Take 1 tablet (100 mg total) by mouth 2 (two) times daily. 180 tablet 1  . metroNIDAZOLE (METROGEL) 1 % gel Apply 1 application topically daily.    . Multiple Vitamins-Minerals (MULTIVITAMIN WITH MINERALS) tablet Take 1 tablet by mouth daily.    Marland Kitchen nystatin cream (MYCOSTATIN) Apply 1 application topically daily as needed for dry skin.    Marland Kitchen Olopatadine HCl (PAZEO) 0.7 % SOLN Apply to eye every morning.    Marland Kitchen omeprazole (  PRILOSEC) 40 MG capsule Take 1 capsule (40 mg total) by mouth daily. 90 capsule 0  . ondansetron (ZOFRAN) 8 MG tablet Take 8 mg by mouth as needed for nausea or vomiting.    Marland Kitchen QUEtiapine (SEROQUEL) 25 MG tablet 1 tablet at night for 2 weeks, then take 2 tablets at night 60 tablet 3  . Red Yeast Rice Extract (RED YEAST RICE PO) Take 1 tablet by mouth daily.     . Riboflavin (VITAMIN B-2 PO) Take by mouth.    . Rizatriptan Benzoate (MAXALT PO) Take 1 tablet by mouth as needed (Headaches).    . TRETINOIN EX Apply topically daily.    Marland Kitchen triamcinolone cream (KENALOG) 0.1 % Apply 1 application topically 2 (two) times daily as needed. 45 g 0  . UNABLE TO  FIND Med Name: HZTZ 37.5 mg     No current facility-administered medications for this visit.      Past Surgical History:  Procedure Laterality Date  . ABDOMINAL HYSTERECTOMY    . CESAREAN SECTION     x1  . CHOLECYSTECTOMY    . ENDOMETRIAL ABLATION    . FOOT SURGERY Left 06/2018   foot reconstruction     Allergies  Allergen Reactions  . Percocet [Oxycodone-Acetaminophen] Nausea And Vomiting      Family History  Problem Relation Age of Onset  . Heart attack Mother   . Stroke Mother   . Cancer Mother        breast cancer  . Hyperthyroidism Mother   . Hyperlipidemia Mother   . Cancer Father        lymph nodes, liver cancer  . Other Daughter        Fine Gold Type II Syndrome and cognetive issues  . Memory loss Daughter   . Other Son        Fine Gold Syndrome and BPES     Social History Amy Lester reports that she has never smoked. She has never used smokeless tobacco. Amy Lester reports current alcohol use.   Review of Systems CONSTITUTIONAL: No weight loss, fever, chills, weakness or fatigue.  HEENT: Eyes: No visual loss, blurred vision, double vision or yellow sclerae.No hearing loss, sneezing, congestion, runny nose or sore throat.  SKIN: No rash or itching.  CARDIOVASCULAR: per hpi RESPIRATORY: No shortness of breath, cough or sputum.  GASTROINTESTINAL: No anorexia, nausea, vomiting or diarrhea. No abdominal pain or blood.  GENITOURINARY: No burning on urination, no polyuria NEUROLOGICAL: No headache, dizziness, syncope, paralysis, ataxia, numbness or tingling in the extremities. No change in bowel or bladder control.  MUSCULOSKELETAL: No muscle, back pain, joint pain or stiffness.  LYMPHATICS: No enlarged nodes. No history of splenectomy.  PSYCHIATRIC: No history of depression or anxiety.  ENDOCRINOLOGIC: No reports of sweating, cold or heat intolerance. No polyuria or polydipsia.  Marland Kitchen   Physical Examination Today's Vitals   03/28/19 0848  BP: 125/86   Pulse: 74  Weight: 224 lb (101.6 kg)  Height: 5\' 4"  (1.626 m)   Body mass index is 38.45 kg/m.  Gen: resting comfortably, no acute distress HEENT: no scleral icterus, pupils equal round and reactive, no palptable cervical adenopathy,  CV: RRR, no m/r/g, no jvd Resp: Clear to auscultation bilaterally GI: abdomen is soft, non-tender, non-distended, normal bowel sounds, no hepatosplenomegaly MSK: extremities are warm, no edema.  Skin: warm, no rash Neuro:  no focal deficits Psych: appropriate affect     Assessment and Plan  1. Palpitations - request records from previous  providers in [place] - obtain 2 week event monitor  2. HTN  at goal    Antoine PocheJonathan F. Aneeka Bowden, M.D.

## 2019-03-28 NOTE — Patient Instructions (Signed)
Medication Instructions:  Your physician recommends that you continue on your current medications as directed. Please refer to the Current Medication list given to you today.  If you need a refill on your cardiac medications before your next appointment, please call your pharmacy.   Lab work: NONE  If you have labs (blood work) drawn today and your tests are completely normal, you will receive your results only by: Marland Kitchen MyChart Message (if you have MyChart) OR . A paper copy in the mail If you have any lab test that is abnormal or we need to change your treatment, we will call you to review the results.  Testing/Procedures: .Your physician has recommended that you wear an event monitor. Event monitors are medical devices that record the heart's electrical activity. Doctors most often [place] these monitors to diagnose arrhythmias. Arrhythmias are problems with the speed or rhythm of the heartbeat. The monitor is a small, portable device. You can wear one while you do your normal daily activities. This is usually used to diagnose what is causing palpitations/syncope (passing out).    Follow-Up: At [facility], you and your health needs are our priority.  As part of our continuing mission to provide you with exceptional heart care, we have created designated Provider Care Teams.  These Care Teams include your primary Cardiologist (physician) and Advanced Practice Providers (APPs -  Physician Assistants and Nurse Practitioners) who all work together to provide you with the care you need, when you need it. You will need a follow up appointment in 6 weeks.  Please call our office 2 months in advance to schedule this appointment.  You may see No primary care provider on file. or one of the following Advanced Practice Providers on your designated Care Team:   [place], PA-C [facility]) . Ermalinda Barrios, PA-C ([facility])  Any Other Special Instructions Will Be Listed Below (If  Applicable). Thank you for choosing [facility]!

## 2019-03-28 NOTE — Addendum Note (Signed)
Addended by: Carlyle Dolly F on: 03/28/2019 12:02 PM   Modules accepted: Level of Service

## 2019-03-30 ENCOUNTER — Telehealth: Payer: Self-pay | Admitting: Neurology

## 2019-03-30 NOTE — Telephone Encounter (Signed)
Pt requesting a call stating she is concerned on having a spinal tap at GI instead of here at the office with Dr. Jannifer Franklin. Also would like to discuss changing medication ketorolac (TORADOL) 10 MG tablet due to s/e such as "sick to mt stomach". Pt also mentioned that Rizatriptan Benzoate (MAXALT PO) was never called into [facility]. Please advise (FYI pt is here with her daughter for an EEG today)

## 2019-03-30 NOTE — Telephone Encounter (Signed)
I called the patient, left a message, I will call back later. 

## 2019-03-30 NOTE — Telephone Encounter (Signed)
I called again, left another message, I will call back later. 

## 2019-03-31 MED ORDER — RIZATRIPTAN BENZOATE 10 MG PO TABS: 10.0000 mg | ORAL_TABLET | Freq: Three times a day (TID) | ORAL | 1 refills | Status: DC | PRN

## 2019-03-31 MED ORDER — TOPIRAMATE 25 MG PO TABS: ORAL_TABLET | ORAL | 1 refills | Status: DC

## 2019-03-31 NOTE — Telephone Encounter (Signed)
I called the patient.  The patient could not tolerate Toradol secondary to stomach upset.  I will send in a prescription for the Maxalt, but she continues to have 5 or 6 headache days a week.  She has been on gabapentin, Topamax, Seroquel, Aimovig, and currently is on metoprolol and is still having frequent headaches.  With the Aimovig, she found that the medication helped some but she would wear off about a week before the next injection.  She was never tried on 70 mg injections every 2 weeks.  This may be a consideration but she is concerned that her current insurance would not pay for.  Her current insurance does not pay for Botox which ultimately would be cheaper than the use of Aimovig.  She has been on Topamax previously and got some benefit from it, we will start her back on Topamax and work up on the dose.  She is to have a spinal tap done in the near future.

## 2019-04-04 ENCOUNTER — Ambulatory Visit (INDEPENDENT_AMBULATORY_CARE_PROVIDER_SITE_OTHER): Payer: Self-pay

## 2019-04-04 DIAGNOSIS — R002 Palpitations: Secondary | ICD-10-CM

## 2019-04-05 ENCOUNTER — Other Ambulatory Visit: Payer: Self-pay

## 2019-04-05 ENCOUNTER — Other Ambulatory Visit (HOSPITAL_COMMUNITY): Payer: Self-pay | Admitting: Physician Assistant

## 2019-04-05 DIAGNOSIS — R002 Palpitations: Secondary | ICD-10-CM

## 2019-04-05 DIAGNOSIS — N644 Mastodynia: Secondary | ICD-10-CM

## 2019-04-12 ENCOUNTER — Encounter: Payer: Self-pay | Admitting: Family Medicine

## 2019-04-12 ENCOUNTER — Ambulatory Visit (INDEPENDENT_AMBULATORY_CARE_PROVIDER_SITE_OTHER): Payer: Self-pay | Admitting: Family Medicine

## 2019-04-12 ENCOUNTER — Other Ambulatory Visit: Payer: Self-pay

## 2019-04-12 VITALS — BP 138/86 | HR 78 | Temp 97.7°F | Ht 64.0 in | Wt 225.0 lb

## 2019-04-12 DIAGNOSIS — E119 Type 2 diabetes mellitus without complications: Secondary | ICD-10-CM

## 2019-04-12 DIAGNOSIS — J302 Other seasonal allergic rhinitis: Secondary | ICD-10-CM

## 2019-04-12 DIAGNOSIS — K219 Gastro-esophageal reflux disease without esophagitis: Secondary | ICD-10-CM

## 2019-04-12 DIAGNOSIS — R002 Palpitations: Secondary | ICD-10-CM

## 2019-04-12 DIAGNOSIS — J4520 Mild intermittent asthma, uncomplicated: Secondary | ICD-10-CM

## 2019-04-12 DIAGNOSIS — E039 Hypothyroidism, unspecified: Secondary | ICD-10-CM

## 2019-04-12 DIAGNOSIS — Z7689 Persons encountering health services in other specified circumstances: Secondary | ICD-10-CM

## 2019-04-12 DIAGNOSIS — Z6838 Body mass index (BMI) 38.0-38.9, adult: Secondary | ICD-10-CM

## 2019-04-12 DIAGNOSIS — G35 Multiple sclerosis: Secondary | ICD-10-CM

## 2019-04-12 DIAGNOSIS — G4733 Obstructive sleep apnea (adult) (pediatric): Secondary | ICD-10-CM

## 2019-04-12 DIAGNOSIS — I10 Essential (primary) hypertension: Secondary | ICD-10-CM

## 2019-04-12 DIAGNOSIS — G43719 Chronic migraine without aura, intractable, without status migrainosus: Secondary | ICD-10-CM

## 2019-04-12 LAB — POCT GLYCOSYLATED HEMOGLOBIN (HGB A1C): Hemoglobin A1C: 6.4 % — AB (ref 4.0–5.6)

## 2019-04-12 MED ORDER — CLOTRIMAZOLE-BETAMETHASONE 1-0.05 % EX CREA: 1.0000 "application " | TOPICAL_CREAM | CUTANEOUS | 2 refills | Status: AC | PRN

## 2019-04-12 MED ORDER — LIOTHYRONINE SODIUM 5 MCG PO TABS: 5.0000 ug | ORAL_TABLET | Freq: Every day | ORAL | 3 refills | Status: DC

## 2019-04-12 MED ORDER — HYDROCHLOROTHIAZIDE 25 MG PO TABS: 25.0000 mg | ORAL_TABLET | Freq: Every day | ORAL | 3 refills | Status: DC

## 2019-04-12 NOTE — Patient Instructions (Addendum)
Health Maintenance Due  Topic Date Due  . HIV Screening  06/26/1984  . TETANUS/TDAP  06/26/1988  . PAP SMEAR-Modifier  06/26/1990  . INFLUENZA VACCINE  04/08/2019    No flowsheet data found.   Your hemoglobin A1C was 6.4% this visit. Asthma Attack Prevention, Adult Although you may not be able to control the fact that you have asthma, you can take actions to prevent episodes of asthma (asthma attacks). These actions include:  Creating a written plan for managing and treating your asthma attacks (asthma action plan).  Monitoring your asthma.  Avoiding things that can irritate your airways or make your asthma symptoms worse (asthma triggers).  Taking your medicines as directed.  Acting quickly if you have signs or symptoms of an asthma attack. What are some ways to prevent an asthma attack? Create a plan Work with your health care provider to create an asthma action plan. This plan should include:  A list of your asthma triggers and how to avoid them.  A list of symptoms that you experience during an asthma attack.  Information about when to take medicine and how much medicine to take.  Information to help you understand your peak flow measurements.  Contact information for your health care providers.  Daily actions that you can take to control asthma. Monitor your asthma To monitor your asthma:  Use your peak flow meter every morning and every evening for 2-3 weeks. Record the results in a journal. A drop in your peak flow numbers on one or more days may mean that you are starting to have an asthma attack, even if you are not having symptoms.  When you have asthma symptoms, write them down in a journal.  Avoid asthma triggers Work with your health care provider to find out what your asthma triggers are. This can be done by:  Being tested for allergies.  Keeping a journal that notes when asthma attacks occur and what may have contributed to them.  Asking your health  care provider whether other medical conditions make your asthma worse. Common asthma triggers include:  Dust.  Smoke. This includes campfire smoke and secondhand smoke from tobacco products.  Pet dander.  Trees, grasses or pollens.  Very cold, dry, or humid air.  Mold.  Foods that contain high amounts of sulfites.  Strong smells.  Engine exhaust and air pollution.  Aerosol sprays and fumes from household cleaners.  Household pests and their droppings, including dust mites and cockroaches.  Certain medicines, including NSAIDs. Once you have determined your asthma triggers, take steps to avoid them. Depending on your triggers, you may be able to reduce the chance of an asthma attack by:  Keeping your home clean. Have someone dust and vacuum your home for you 1 or 2 times a week. If possible, have them use a high-efficiency particulate arrestance (HEPA) vacuum.  Washing your sheets weekly in hot water.  Using allergy-proof mattress covers and casings on your bed.  Keeping pets out of your home.  Taking care of mold and water problems in your home.  Avoiding areas where people smoke.  Avoiding using strong perfumes or odor sprays.  Avoid spending a lot of time outdoors when pollen counts are high and on very windy days.  Talking with your health care provider before stopping or starting any new medicines. Medicines Take over-the-counter and prescription medicines only as told by your health care provider. Many asthma attacks can be prevented by carefully following your medicine schedule. Taking your  medicines correctly is especially important when you cannot avoid certain asthma triggers. Even if you are doing well, do not stop taking your medicine and do not take less medicine. Act quickly If an asthma attack happens, acting quickly can decrease how severe it is and how long it lasts. Take these actions:  Pay attention to your symptoms. If you are coughing, wheezing, or  having difficulty breathing, do not wait to see if your symptoms go away on their own. Follow your asthma action plan.  If you have followed your asthma action plan and your symptoms are not improving, call your health care provider or seek immediate medical care at the nearest hospital. It is important to write down how often you need to use your fast-acting rescue inhaler. You can track how often you use an inhaler in your journal. If you are using your rescue inhaler more often, it may mean that your asthma is not under control. Adjusting your asthma treatment plan may help you to prevent future asthma attacks and help you to gain better control of your condition. How can I prevent an asthma attack when I exercise? Exercise is a common asthma trigger. To prevent asthma attacks during exercise:  Follow advice from your health care provider about whether you should use your fast-acting inhaler before exercising. Many people with asthma experience exercise-induced bronchoconstriction (EIB). This condition often worsens during vigorous exercise in cold, humid, or dry environments. Usually, people with EIB can stay very active by using a fast-acting inhaler before exercising.  Avoid exercising outdoors in very cold or humid weather.  Avoid exercising outdoors when pollen counts are high.  Warm up and cool down when exercising.  Stop exercising right away if asthma symptoms start. Consider taking part in exercises that are less likely to cause asthma symptoms such as:  Indoor swimming.  Biking.  Walking.  Hiking.  Playing football. This information is not intended to replace advice given to you by your health care provider. Make sure you discuss any questions you have with your health care provider. Document Released: 08/12/2009 Document Revised: 08/06/2017 Document Reviewed: 02/08/2016 Elsevier Patient Education  2020 [employer]  CPAP and BPAP Information CPAP and BPAP are methods  of helping a person breathe with the use of air pressure. CPAP stands for "continuous positive airway pressure." BPAP stands for "bi-level positive airway pressure." In both methods, air is blown through your nose or mouth and into your air passages to help you breathe well. CPAP and BPAP use different amounts of pressure to blow air. With CPAP, the amount of pressure stays the same while you breathe in and out. With BPAP, the amount of pressure is increased when you breathe in (inhale) so that you can take larger breaths. Your health care provider will recommend whether CPAP or BPAP would be more helpful for you. Why are CPAP and BPAP treatments used? CPAP or BPAP can be helpful if you have:  Sleep apnea.  Chronic obstructive pulmonary disease (COPD).  Heart failure.  Medical conditions that weaken the muscles of the chest including muscular dystrophy, or neurological diseases such as amyotrophic lateral sclerosis (ALS).  Other problems that cause breathing to be weak, abnormal, or difficult. CPAP is most commonly used for obstructive sleep apnea (OSA) to keep the airways from collapsing when the muscles relax during sleep. How is CPAP or BPAP administered? Both CPAP and BPAP are provided by a small machine with a flexible plastic tube that attaches to a plastic mask.  You wear the mask. Air is blown through the mask into your nose or mouth. The amount of pressure that is used to blow the air can be adjusted on the machine. Your health care provider will determine the pressure setting that should be used based on your individual needs. When should CPAP or BPAP be used? In most cases, the mask only needs to be worn during sleep. Generally, the mask needs to be worn throughout the night and during any daytime naps. People with certain medical conditions may also need to wear the mask at other times when they are awake. Follow instructions from your health care provider about when to use the  machine. What are some tips for using the mask?   Because the mask needs to be snug, some people feel trapped or closed-in (claustrophobic) when first using the mask. If you feel this way, you may need to get used to the mask. One way to do this is by holding the mask loosely over your nose or mouth and then gradually applying the mask more snugly. You can also gradually increase the amount of time that you use the mask.  Masks are available in various types and sizes. Some fit over your mouth and nose while others fit over just your nose. If your mask does not fit well, talk with your health care provider about getting a different one.  If you are using a mask that fits over your nose and you tend to breathe through your mouth, a chin strap may be applied to help keep your mouth closed.  The CPAP and BPAP machines have alarms that may sound if the mask comes off or develops a leak.  If you have trouble with the mask, it is very important that you talk with your health care provider about finding a way to make the mask easier to tolerate. Do not stop using the mask. Stopping the use of the mask could have a negative impact on your health. What are some tips for using the machine?  Place your CPAP or BPAP machine on a secure table or stand near an electrical outlet.  Know where the on/off switch is located on the machine.  Follow instructions from your health care provider about how to set the pressure on your machine and when you should use it.  Do not eat or drink while the CPAP or BPAP machine is on. Food or fluids could get pushed into your lungs by the pressure of the CPAP or BPAP.  Do not smoke. Tobacco smoke residue can damage the machine.  For home use, CPAP and BPAP machines can be rented or purchased through home health care companies. Many different brands of machines are available. Renting a machine before purchasing may help you find out which particular machine works well for  you.  Keep the CPAP or BPAP machine and attachments clean. Ask your health care provider for specific instructions. Get help right away if:  You have redness or open areas around your nose or mouth where the mask fits.  You have trouble using the CPAP or BPAP machine.  You cannot tolerate wearing the CPAP or BPAP mask.  You have pain, discomfort, and bloating in your abdomen. Summary  CPAP and BPAP are methods of helping a person breathe with the use of air pressure.  Both CPAP and BPAP are provided by a small machine with a flexible plastic tube that attaches to a plastic mask.  If you have trouble  with the mask, it is very important that you talk with your health care provider about finding a way to make the mask easier to tolerate. This information is not intended to replace advice given to you by your health care provider. Make sure you discuss any questions you have with your health care provider. Document Released: 05/22/2004 Document Revised: 12/14/2018 Document Reviewed: 07/13/2016 Elsevier Patient Education  [employer].  Diabetes Mellitus and Sick Day Management Blood sugar (glucose) can be difficult to control when you are sick. Common illnesses that can cause problems for people with diabetes (diabetes mellitus) include colds, fever, flu (influenza), nausea, vomiting, and diarrhea. These illnesses can cause stress and loss of body fluids (dehydration), and those issues can cause blood glucose levels to increase. Because of this, it is very important to take your insulin and diabetes medicines and eat some form of carbohydrate when you are sick. You should make a plan for days when you are sick (sick day plan) as part of your diabetes management plan. You and your health care provider should make this plan in advance. The following guidelines are intended to help you manage an illness that lasts for about 24 hours or less. Your health care provider may also give you more  specific instructions. What do I need to do to manage my blood glucose?   Check your blood glucose every 2-4 hours, or as often as told by your health care provider.  Know your sick day treatment goals. Your target blood glucose levels may be different when you are sick.  If you use insulin, take your usual dose. ? If your blood glucose continues to be too high, you may need to take an additional insulin dose as told by your health care provider.  If you use oral diabetes medicine, you may need to stop taking it if you are not able to eat or drink normally. Ask your health care provider about whether you need to stop taking these medicines while you are sick.  If you use injectable hormone medicines other than insulin to control your diabetes, ask your health care provider about whether you need to stop taking these medicines while you are sick. What else can I do to manage my diabetes when I am sick? Check your ketones  If you have type 1 diabetes, check your urine ketones every 4 hours.  If you have type 2 diabetes, check your urine ketones as often as told by your health care provider. Drink fluids  Drink enough fluid to keep your urine clear or pale yellow. This is especially important if you have a fever, vomiting, or diarrhea. Those symptoms can lead to dehydration.  Follow any instructions from your health care provider about beverages to avoid. ? Do not drink alcohol, caffeine, or drinks that contain a lot of sugar. Take medicines as directed  Take-over-the-counter and prescription medicines only as told by your health care provider.  Check medicine labels for added sugars. Some medicines may contain sugar or types of sugars that can raise your blood glucose level. What foods can I eat when I am sick?  You need to eat some form of carbohydrates when you are sick. You should eat 45-50 grams (45-50 g) of carbohydrates every 3-4 hours until you feel better. All of the food  choices below contain about 15 g of carbohydrates. Plan ahead and keep some of these foods around so you have them if you get sick.  4-6 oz (120-177 mL)  carbonated beverage that contains sugar, such as regular (not diet) soda. You may be able to drink carbonated beverages more easily if you open the beverage and let it sit at room temperature for a few minutes before drinking.   of a twin frozen ice pop.  4 oz (120 g) regular gelatin.  4 oz (120 mL) fruit juice.  4 oz (120 g) ice cream or frozen yogurt.  2 oz (60 g) sherbet.  8 oz (240 mL) clear broth or soup.  4 oz (120 g) regular custard.  4 oz (120 g) regular pudding.  8 oz (240 g) plain yogurt.  1 slice bread or toast.  6 saltine crackers.  5 vanilla wafers. Questions to ask your health care provider Consider asking the following questions so you know what to do on days when you are sick:  Should I adjust my diabetes medicines?  How often do I need to check my blood glucose?  What supplies do I need to manage my diabetes at home when I am sick?  What number can I call if I have questions?  What foods and drinks should I avoid? Contact a health care provider if:  You develop symptoms of diabetic ketoacidosis, such as: ? Fatigue. ? Weight loss. ? Excessive thirst. ? Light-headedness. ? Fruity or sweet-smelling breath. ? Excessive urination. ? Vision changes. ? Confusion or irritability. ? Nausea. ? Vomiting. ? Rapid breathing. ? Pain in the abdomen. ? Feeling flushed.  You are unable to drink fluids without vomiting.  You have any of the following for more than 6 hours: ? Nausea. ? Vomiting. ? Diarrhea.  Your blood glucose is at or above 240 mg/dL (40.9 mmol/L), even after you take an additional insulin dose.  You have a change in how you think, feel, or act (mental status).  You develop another serious illness.  You have been sick or have had a fever for 2 days or longer and you are not getting  better. Get help right away if:  Your blood glucose is lower than 54 mg/dL (3.0 mmol/L).  You have difficulty breathing.  You have moderate or high ketone levels in your urine.  You used emergency glucagon to treat low blood glucose. Summary  Blood sugar (glucose) can be difficult to control when you are sick. Common illnesses that can cause problems for people with diabetes (diabetes mellitus) include colds, fever, flu (influenza), nausea, vomiting, and diarrhea.  Illnesses can cause stress and loss of body fluids (dehydration), and those issues can cause blood glucose levels to increase.  Make a plan for days when you are sick (sick day plan) as part of your diabetes management plan. You and your health care provider should make this plan in advance.  It is very important to take your insulin and diabetes medicines and to eat some form of carbohydrate when you are sick.  Contact your health care provider if have problems managing your blood glucose levels when you are sick, or if you have been sick or had a fever for 2 days or longer and are not getting better. This information is not intended to replace advice given to you by your health care provider. Make sure you discuss any questions you have with your health care provider. Document Released: 08/27/2003 Document Revised: 05/22/2016 Document Reviewed: 05/22/2016 Elsevier Patient Education  2020 Elsevier Inc.  Diabetes Mellitus and Nutrition, Adult When you have diabetes (diabetes mellitus), it is very important to have healthy eating habits because your  blood sugar (glucose) levels are greatly affected by what you eat and drink. Eating healthy foods in the appropriate amounts, at about the same times every day, can help you:  Control your blood glucose.  Lower your risk of heart disease.  Improve your blood pressure.  Reach or maintain a healthy weight. Every person with diabetes is different, and each person has different  needs for a meal plan. Your health care provider may recommend that you work with a diet and nutrition specialist (dietitian) to make a meal plan that is best for you. Your meal plan may vary depending on factors such as:  The calories you need.  The medicines you take.  Your weight.  Your blood glucose, blood pressure, and cholesterol levels.  Your activity level.  Other health conditions you have, such as heart or kidney disease. How do carbohydrates affect me? Carbohydrates, also called carbs, affect your blood glucose level more than any other type of food. Eating carbs naturally raises the amount of glucose in your blood. Carb counting is a method for keeping track of how many carbs you eat. Counting carbs is important to keep your blood glucose at a healthy level, especially if you use insulin or take certain oral diabetes medicines. It is important to know how many carbs you can safely have in each meal. This is different for every person. Your dietitian can help you calculate how many carbs you should have at each meal and for each snack. Foods that contain carbs include:  Bread, cereal, rice, pasta, and crackers.  Potatoes and corn.  Peas, beans, and lentils.  Milk and yogurt.  Fruit and juice.  Desserts, such as cakes, cookies, ice cream, and candy. How does alcohol affect me? Alcohol can cause a sudden decrease in blood glucose (hypoglycemia), especially if you use insulin or take certain oral diabetes medicines. Hypoglycemia can be a life-threatening condition. Symptoms of hypoglycemia (sleepiness, dizziness, and confusion) are similar to symptoms of having too much alcohol. If your health care provider says that alcohol is safe for you, follow these guidelines:  Limit alcohol intake to no more than 1 drink per day for nonpregnant women and 2 drinks per day for men. One drink equals 12 oz of beer, 5 oz of wine, or 1 oz of hard liquor.  Do not drink on an empty  stomach.  Keep yourself hydrated with water, diet soda, or unsweetened iced tea.  Keep in mind that regular soda, juice, and other mixers may contain a lot of sugar and must be counted as carbs. What are tips for following this plan?  Reading food labels  Start by checking the serving size on the "Nutrition Facts" label of packaged foods and drinks. The amount of calories, carbs, fats, and other nutrients listed on the label is based on one serving of the item. Many items contain more than one serving per package.  Check the total grams (g) of carbs in one serving. You can calculate the number of servings of carbs in one serving by dividing the total carbs by 15. For example, if a food has 30 g of total carbs, it would be equal to 2 servings of carbs.  Check the number of grams (g) of saturated and trans fats in one serving. Choose foods that have low or no amount of these fats.  Check the number of milligrams (mg) of salt (sodium) in one serving. Most people should limit total sodium intake to less than 2,300 mg per  day.  Always check the nutrition information of foods labeled as "low-fat" or "nonfat". These foods may be higher in added sugar or refined carbs and should be avoided.  Talk to your dietitian to identify your daily goals for nutrients listed on the label. Shopping  Avoid buying canned, premade, or processed foods. These foods tend to be high in fat, sodium, and added sugar.  Shop around the outside edge of the grocery store. This includes fresh fruits and vegetables, bulk grains, fresh meats, and fresh dairy. Cooking  Use low-heat cooking methods, such as baking, instead of high-heat cooking methods like deep frying.  Cook using healthy oils, such as olive, canola, or sunflower oil.  Avoid cooking with butter, cream, or high-fat meats. Meal planning  Eat meals and snacks regularly, preferably at the same times every day. Avoid going long periods of time without  eating.  Eat foods high in fiber, such as fresh fruits, vegetables, beans, and whole grains. Talk to your dietitian about how many servings of carbs you can eat at each meal.  Eat 4-6 ounces (oz) of lean protein each day, such as lean meat, chicken, fish, eggs, or tofu. One oz of lean protein is equal to: ? 1 oz of meat, chicken, or fish. ? 1 egg. ?  cup of tofu.  Eat some foods each day that contain healthy fats, such as avocado, nuts, seeds, and fish. Lifestyle  Check your blood glucose regularly.  Exercise regularly as told by your health care provider. This may include: ? 150 minutes of moderate-intensity or vigorous-intensity exercise each week. This could be brisk walking, biking, or water aerobics. ? Stretching and doing strength exercises, such as yoga or weightlifting, at least 2 times a week.  Take medicines as told by your health care provider.  Do not use any products that contain nicotine or tobacco, such as cigarettes and e-cigarettes. If you need help quitting, ask your health care provider.  Work with a Veterinary surgeon or diabetes educator to identify strategies to manage stress and any emotional and social challenges. Questions to ask a health care provider  Do I need to meet with a diabetes educator?  Do I need to meet with a dietitian?  What number can I call if I have questions?  When are the best times to check my blood glucose? Where to find more information:  American Diabetes Association: diabetes.org  Academy of Nutrition and Dietetics: www.eatright.[employer] of Diabetes and Digestive and Kidney Diseases (NIH): CarFli[employer].tn Summary  A healthy meal plan will help you control your blood glucose and maintain a healthy lifestyle.  Working with a diet and nutrition specialist (dietitian) can help you make a meal plan that is best for you.  Keep in mind that carbohydrates (carbs) and alcohol have immediate effects on your blood glucose  levels. It is important to count carbs and to use alcohol carefully. This information is not intended to replace advice given to you by your health care provider. Make sure you discuss any questions you have with your health care provider. Document Released: 05/21/2005 Document Revised: 08/06/2017 Document Reviewed: 09/28/2016 Elsevier Patient Education  2020 [employer].  Migraine Headache A migraine headache is an intense, throbbing pain on one side or both sides of the head. Migraine headaches may also cause other symptoms, such as nausea, vomiting, and sensitivity to light and noise. A migraine headache can last from 4 hours to 3 days. Talk with your doctor about what things may bring  on (trigger) your migraine headaches. What are the causes? The exact cause of this condition is not known. However, a migraine may be caused when nerves in the brain become irritated and release chemicals that cause inflammation of blood vessels. This inflammation causes pain. This condition may be triggered or caused by:  Drinking alcohol.  Smoking.  Taking medicines, such as: ? Medicine used to treat chest pain (nitroglycerin). ? Birth control pills. ? Estrogen. ? Certain blood pressure medicines.  Eating or drinking products that contain nitrates, glutamate, aspartame, or tyramine. Aged cheeses, chocolate, or caffeine may also be triggers.  Doing physical activity. Other things that may trigger a migraine headache include:  Menstruation.  Pregnancy.  Hunger.  Stress.  Lack of sleep or too much sleep.  Weather changes.  Fatigue. What increases the risk? The following factors may make you more likely to experience migraine headaches:  Being a certain age. This condition is more common in people who are 43-6 years old.  Being female.  Having a family history of migraine headaches.  Being Caucasian.  Having a mental health condition, such as depression or anxiety.  Being  obese. What are the signs or symptoms? The main symptom of this condition is pulsating or throbbing pain. This pain may:  Happen in any area of the head, such as on one side or both sides.  Interfere with daily activities.  Get worse with physical activity.  Get worse with exposure to bright lights or loud noises. Other symptoms may include:  Nausea.  Vomiting.  Dizziness.  General sensitivity to bright lights, loud noises, or smells. Before you get a migraine headache, you may get warning signs (an aura). An aura may include:  Seeing flashing lights or having blind spots.  Seeing bright spots, halos, or zigzag lines.  Having tunnel vision or blurred vision.  Having numbness or a tingling feeling.  Having trouble talking.  Having muscle weakness. Some people have symptoms after a migraine headache (postdromal phase), such as:  Feeling tired.  Difficulty concentrating. How is this diagnosed? A migraine headache can be diagnosed based on:  Your symptoms.  A physical exam.  Tests, such as: ? CT scan or an MRI of the head. These imaging tests can help rule out other causes of headaches. ? Taking fluid from the spine (lumbar puncture) and analyzing it (cerebrospinal fluid analysis, or CSF analysis). How is this treated? This condition may be treated with medicines that:  Relieve pain.  Relieve nausea.  Prevent migraine headaches. Treatment for this condition may also include:  Acupuncture.  Lifestyle changes like avoiding foods that trigger migraine headaches.  Biofeedback.  Cognitive behavioral therapy. Follow these instructions at home: Medicines  Take over-the-counter and prescription medicines only as told by your health care provider.  Ask your health care provider if the medicine prescribed to you: ? Requires you to avoid driving or using heavy machinery. ? Can cause constipation. You may need to take these actions to prevent or treat  constipation:  Drink enough fluid to keep your urine pale yellow.  Take over-the-counter or prescription medicines.  Eat foods that are high in fiber, such as beans, whole grains, and fresh fruits and vegetables.  Limit foods that are high in fat and processed sugars, such as fried or sweet foods. Lifestyle  Do not drink alcohol.  Do not use any products that contain nicotine or tobacco, such as cigarettes, e-cigarettes, and chewing tobacco. If you need help quitting, ask your health care provider.  Get at least 8 hours of sleep every night.  Find ways to manage stress, such as meditation, deep breathing, or yoga. General instructions      Keep a journal to find out what may trigger your migraine headaches. For example, write down: ? What you eat and drink. ? How much sleep you get. ? Any change to your diet or medicines.  If you have a migraine headache: ? Avoid things that make your symptoms worse, such as bright lights. ? It may help to lie down in a dark, quiet room. ? Do not drive or use heavy machinery. ? Ask your health care provider what activities are safe for you while you are experiencing symptoms.  Keep all follow-up visits as told by your health care provider. This is important. Contact a health care provider if:  You develop symptoms that are different or more severe than your usual migraine headache symptoms.  You have more than 15 headache days in one month. Get help right away if:  Your migraine headache becomes severe.  Your migraine headache lasts longer than 72 hours.  You have a fever.  You have a stiff neck.  You have vision loss.  Your muscles feel weak or like you cannot control them.  You start to lose your balance often.  You have trouble walking.  You faint.  You have a seizure. Summary  A migraine headache is an intense, throbbing pain on one side or both sides of the head. Migraines may also cause other symptoms, such as  nausea, vomiting, and sensitivity to light and noise.  This condition may be treated with medicines and lifestyle changes. You may also need to avoid certain things that trigger a migraine headache.  Keep a journal to find out what may trigger your migraine headaches.  Contact your health care provider if you have more than 15 headache days in a month or you develop symptoms that are different or more severe than your usual migraine headache symptoms. This information is not intended to replace advice given to you by your health care provider. Make sure you discuss any questions you have with your health care provider. Document Released: 08/24/2005 Document Revised: 12/16/2018 Document Reviewed: 10/06/2018 Elsevier Patient Education  2020 [employer].

## 2019-04-12 NOTE — Progress Notes (Signed)
Patient presents to clinic today to establish care.  SUBJECTIVE: PMH: Patient is a 48 year old female with past medical history significant for HTN, hypothyroidism, multiple sclerosis, GERD, asthma, seasonal allergies, migraines, diabetes type 2, OSA, chronic left foot pain.  Pt previously seen in [place].  DM II: -not on meds -metformin caused dizziness, queasy feeling after eating -Not not checking FSBS regularly -fsbs 120s-170s -Trying to eat gluten-free -Drinks 1 to 2 cans of soda per day -Is not exercising 2/2 foot pain  Hypothyroidism: -Taking Synthroid 225 mcg and Cytomel 5 mg daily -Unsure of last TSH -Denies symptoms -Requesting Endo referral  Left foot injury/pain -Endorses reconstructive surgery in 2019 after falling on a curb -States procedure done at [facility]  History of MS: -Endorses weakness on left side at baseline -Followed by [facility]  HTN: -Not checking BP at home -Taking Lopressor, HCTZ  Seasonal allergies: -May take Allegra and cetirizine -Has albuterol inhaler  Migraines: -Last migraine 2 days ago -May have 3-4/week -Was on triptans and Aimovig in the past now on Maxalt -We will have pain in central forehead and right temple area -Also endorses dizziness, light and sound sensitivity -Denies aura  OSA: -States is on BiPAP  GERD: -Take omeprazole  Asthma: -Use albuterol inhaler as needed -Denies frequent symptoms  History of palpitations: -Patient recently seen in ED -Currently wearing Holter monitor -Drinking 1-2 sodas per day -TSH normal when checked 03/24/2019  Allergies: Percocet-upset stomach Keflex?-Unsure causes at the time she took it  Social history: Patient is a Engineer, civil (consulting).  Patient moved to the area with her husband and son.  Patient denies alcohol, tobacco, drug use.   Past Medical History:  Diagnosis Date  . Asthma   . Blood transfusion without reported diagnosis   .  Chronic migraine   . Common migraine with intractable migraine 02/16/2019  . Diabetes mellitus without complication (HCC)   . Endometriosis   . Fatty liver   . GERD (gastroesophageal reflux disease)   . Hyperlipidemia   . Hypertension   . Hypothyroidism   . Incontinence   . Irregular heartbeat   . Multiple sclerosis (HCC)   . Palpitations   . Sleep apnea     Past Surgical History:  Procedure Laterality Date  . ABDOMINAL HYSTERECTOMY    . CESAREAN SECTION     x1  . CHOLECYSTECTOMY    . ENDOMETRIAL ABLATION    . FOOT SURGERY Left 06/2018   foot reconstruction  . REPLACEMENT TOTAL KNEE Left     Current Outpatient Medications on File Prior to Visit  Medication Sig Dispense Refill  . albuterol (VENTOLIN HFA) 108 (90 Base) MCG/ACT inhaler Inhale 3 puffs into the lungs every 6 (six) hours as needed for wheezing or shortness of breath. 3 g 1  . atorvastatin (LIPITOR) 10 MG tablet Take 1 tablet (10 mg total) by mouth daily. 90 tablet 1  . cetirizine (ZYRTEC) 10 MG tablet Take 1 tablet (10 mg total) by mouth daily. 90 tablet 0  . clotrimazole-betamethasone (LOTRISONE) cream Apply 1 application topically as needed.    Marland Kitchen estradiol (EVAMIST) 1.53 MG/SPRAY transdermal spray Place 3 sprays onto the skin daily.    Marland Kitchen glipiZIDE (GLUCOTROL) 5 MG tablet Take 1 tablet (5 mg total) by mouth daily before breakfast. 90 tablet 1  . hydrochlorothiazide (HYDRODIURIL) 25 MG tablet Take 25 mg by mouth daily.    Marland Kitchen HYDROCODONE-ACETAMINOPHEN PO Take 1 tablet by mouth as needed.    Marland Kitchen levothyroxine (SYNTHROID)  200 MCG tablet Take 1 tablet (200 mcg total) by mouth daily before breakfast. 90 tablet 0  . levothyroxine (SYNTHROID) 25 MCG tablet Take 1 tablet (25 mcg total) by mouth daily before breakfast. 90 tablet 0  . liothyronine (CYTOMEL) 5 MCG tablet Take 5 mcg by mouth daily.    Marland Kitchen. lisinopril (ZESTRIL) 10 MG tablet Take 1 tablet (10 mg total) by mouth daily. 90 tablet 0  . metoprolol tartrate (LOPRESSOR) 100  MG tablet Take 1 tablet (100 mg total) by mouth 2 (two) times daily. 180 tablet 1  . metroNIDAZOLE (METROGEL) 1 % gel Apply 1 application topically daily.    . Multiple Vitamins-Minerals (MULTIVITAMIN WITH MINERALS) tablet Take 1 tablet by mouth daily.    Marland Kitchen. nystatin cream (MYCOSTATIN) Apply 1 application topically daily as needed for dry skin.    Marland Kitchen. Olopatadine HCl (PAZEO) 0.7 % SOLN Apply to eye every morning.    Marland Kitchen. omeprazole (PRILOSEC) 40 MG capsule Take 1 capsule (40 mg total) by mouth daily. 90 capsule 0  . ondansetron (ZOFRAN) 8 MG tablet Take 8 mg by mouth as needed for nausea or vomiting.    . Red Yeast Rice Extract (RED YEAST RICE PO) Take 1 tablet by mouth daily.     . Riboflavin (VITAMIN B-2 PO) Take by mouth.    . rizatriptan (MAXALT) 10 MG tablet Take 1 tablet (10 mg total) by mouth 3 (three) times daily as needed for migraine. May repeat in 2 hours if needed 27 tablet 1  . topiramate (TOPAMAX) 25 MG tablet Take one tablet at night for one week, then take 2 tablets at night for one week, then take 3 tablets at night. 90 tablet 1  . TRETINOIN EX Apply topically daily.    Marland Kitchen. triamcinolone cream (KENALOG) 0.1 % Apply 1 application topically 2 (two) times daily as needed. 45 g 0  . UNABLE TO FIND Med Name: HZTZ 37.5 mg     No current facility-administered medications on file prior to visit.     Allergies  Allergen Reactions  . Percocet [Oxycodone-Acetaminophen] Nausea And Vomiting    Family History  Problem Relation Age of Onset  . Heart attack Mother   . Stroke Mother   . Cancer Mother        breast cancer  . Hyperthyroidism Mother   . Hyperlipidemia Mother   . Cancer Father        lymph nodes, liver cancer  . Other Daughter        Fine Gold Type II Syndrome and cognetive issues  . Memory loss Daughter   . Other Son        Fine Gold Syndrome and BPES    Social History   Socioeconomic History  . Marital status: Unknown    Spouse name: Not on file  . Number of  children: Not on file  . Years of education: 8114  . Highest education level: Not on file  Occupational History  . Not on file  Social Needs  . Financial resource strain: Not on file  . Food insecurity    Worry: Not on file    Inability: Not on file  . Transportation needs    Medical: Not on file    Non-medical: Not on file  Tobacco Use  . Smoking status: Never Smoker  . Smokeless tobacco: Never Used  Substance and Sexual Activity  . Alcohol use: Yes    Frequency: Never    Comment: once a year  .  Drug use: Never  . Sexual activity: Not on file  Lifestyle  . Physical activity    Days per week: Not on file    Minutes per session: Not on file  . Stress: Not on file  Relationships  . Social Herbalist on phone: Not on file    Gets together: Not on file    Attends religious service: Not on file    Active member of club or organization: Not on file    Attends meetings of clubs or organizations: Not on file    Relationship status: Not on file  . Intimate partner violence    Fear of current or ex partner: Not on file    Emotionally abused: Not on file    Physically abused: Not on file    Forced sexual activity: Not on file  Other Topics Concern  . Not on file  Social History Narrative   Left handed    Caffeine  1 soda per day    Lives at home with husband and adult daughter    ROS General: Denies fever, chills, night sweats, changes in weight, changes in appetite HEENT: Denies ear pain, changes in vision, rhinorrhea, sore throat +HAs CV: Denies CP, palpitations, SOB, orthopnea Pulm: Denies SOB, cough, wheezing GI: Denies abdominal pain, nausea, vomiting, diarrhea, constipation GU: Denies dysuria, hematuria, frequency, vaginal discharge Msk: Denies muscle cramps, joint pains  +L foot pain Neuro: Denies weakness, numbness, tingling  +weakness of L side- h/o MS Skin: Denies rashes, bruising Psych: Denies depression, anxiety, hallucinations   BP 138/86    Pulse 78   Temp 97.7 F (36.5 C) (Oral)   Ht 5\' 4"  (1.626 m)   Wt 225 lb (102.1 kg)   SpO2 97%   BMI 38.62 kg/m   Physical Exam Gen. Pleasant, well developed, well-nourished, in NAD HEENT - [place]/AT, PERRL, conjunctive clear, no scleral icterus, no nasal drainage, pharynx without erythema or exudate. Neck: No JVD, no thyromegaly Lungs: no use of accessory muscles, no dullness to percussion, CTAB, no wheezes, rales or rhonchi Cardiovascular: RRR, No r/g/m, no peripheral edema, holter monitor in place Abdomen: BS present, soft, nontender,nondistended Musculoskeletal: No deformities, moves all four extremities, no cyanosis or clubbing, normal tone Neuro:  A&Ox3, CN II-XII intact, normal gait Skin:  Warm, dry, intact, no lesions   Recent Results (from the past 2160 hour(s))  Hepatitis B core antibody, total     Status: None   Collection Time: 02/16/19 12:09 PM  Result Value Ref Range   Hep B Core Total Ab Negative Negative  QuantiFERON-TB Gold Plus     Status: None   Collection Time: 02/16/19 12:09 PM  Result Value Ref Range   QuantiFERON Incubation Incubation performed.    QuantiFERON Criteria Comment     Comment: The QuantiFERON-TB Gold Plus result is determined by subtracting the Nil value from either TB antigen (Ag) tube. The mitogen tube serves as a control for the test.    QuantiFERON TB1 Ag Value 0.02 IU/mL   QuantiFERON TB2 Ag Value 0.02 IU/mL   QuantiFERON Nil Value 0.02 IU/mL   QuantiFERON Mitogen Value >10.00 IU/mL   QuantiFERON-TB Gold Plus Negative Negative  Hepatitis C antibody     Status: None   Collection Time: 02/16/19 12:09 PM  Result Value Ref Range   Hep C Virus Ab <0.1 0.0 - 0.9 s/co ratio    Comment:  Negative:     < 0.8                              Indeterminate: 0.8 - 0.9                                   Positive:     > 0.9  The CDC recommends that a positive HCV antibody result  be followed up with a HCV Nucleic Acid  Amplification  test (161096).   Varicella zoster antibody, IgG     Status: None   Collection Time: 02/16/19 12:09 PM  Result Value Ref Range   Varicella zoster IgG >4000 Immune >165 index    Comment:                                Negative          <135                                Equivocal    135 - 165                                Positive          >165 A positive result generally indicates exposure to the pathogen or administration of specific immunoglobulins, but it is not indication of active infection or stage of disease.   TSH     Status: Abnormal   Collection Time: 02/24/19  2:53 PM  Result Value Ref Range   TSH 9.694 (H) 0.350 - 4.500 uIU/mL    Comment: Performed by a 3rd Generation assay with a functional sensitivity of <=0.01 uIU/mL. Performed at [facility], [address] [place], [place] [address]   Lipid panel     Status: Abnormal   Collection Time: 02/24/19  2:55 PM  Result Value Ref Range   Cholesterol 246 (H) 0 - 200 mg/dL   Triglycerides 981 (H) <150 mg/dL   HDL 45 >19 mg/dL   Total CHOL/HDL Ratio 5.5 RATIO   VLDL 38 0 - 40 mg/dL   LDL Cholesterol 147 (H) 0 - 99 mg/dL    Comment:        Total Cholesterol/HDL:CHD Risk Coronary Heart Disease Risk Table                     Men   Women  1/2 Average Risk   3.4   3.3  Average Risk       5.0   4.4  2 X Average Risk   9.6   7.1  3 X Average Risk  23.4   11.0        Use the calculated Patient Ratio above and the CHD Risk Table to determine the patient's CHD Risk.        ATP III CLASSIFICATION (LDL):  <100     mg/dL   Optimal  829-562  mg/dL   Near or Above                    Optimal  130-159  mg/dL   Borderline  130-865  mg/dL   High  >784  mg/dL   Very High Performed at [facility], [address] [place], [place] [address]   T4, free     Status: None   Collection Time: 02/24/19  2:55 PM  Result Value Ref Range   Free T4 0.73 0.61 - 1.12 ng/dL    Comment: (NOTE) Biotin ingestion may  interfere with free T4 tests. If the results are inconsistent with the TSH level, previous test results, or the clinical presentation, then consider biotin interference. If needed, order repeat testing after stopping biotin. Performed at [facility], [address] [place], [place] [address]   Comprehensive metabolic panel     Status: Abnormal   Collection Time: 02/24/19  2:56 PM  Result Value Ref Range   Sodium 138 135 - 145 mmol/L   Potassium 3.7 3.5 - 5.1 mmol/L   Chloride 104 98 - 111 mmol/L   CO2 22 22 - 32 mmol/L   Glucose, Bld 149 (H) 70 - 99 mg/dL   BUN 18 6 - 20 mg/dL   Creatinine, Ser 0.980.82 0.44 - 1.00 mg/dL   Calcium 9.3 8.9 - 11.910.3 mg/dL   Total Protein 8.3 (H) 6.5 - 8.1 g/dL   Albumin 4.1 3.5 - 5.0 g/dL   AST 29 15 - 41 U/L   ALT 34 0 - 44 U/L   Alkaline Phosphatase 122 38 - 126 U/L   Total Bilirubin 0.5 0.3 - 1.2 mg/dL   GFR calc non Af Amer >60 >60 mL/min   GFR calc Af Amer >60 >60 mL/min   Anion gap 12 5 - 15    Comment: Performed at [facility], [address] [place], [place] [address]  Hemoglobin A1c     Status: Abnormal   Collection Time: 02/24/19  2:56 PM  Result Value Ref Range   Hgb A1c MFr Bld 6.5 (H) 4.8 - 5.6 %    Comment: (NOTE) Pre diabetes:          5.7%-6.4% Diabetes:              >6.4% Glycemic control for   <7.0% adults with diabetes    Mean Plasma Glucose 139.85 mg/dL    Comment: Performed at [facility], [address] [place], [place] [address]  CBC with Differential/Platelet     Status: None   Collection Time: 03/24/19 11:23 AM  Result Value Ref Range   WBC 7.1 4.0 - 10.5 K/uL   RBC 4.46 3.87 - 5.11 MIL/uL   Hemoglobin 13.1 12.0 - 15.0 g/dL   HCT 30.840.7 65.736.0 - 84.646.0 %   MCV 91.3 80.0 - 100.0 fL   MCH 29.4 26.0 - 34.0 pg   MCHC 32.2 30.0 - 36.0 g/dL   RDW 96.212.7 95.211.5 - 84.115.5 %   Platelets 315 150 - 400 K/uL   nRBC 0.0 0.0 - 0.2 %   Neutrophils Relative % 65 %   Neutro Abs 4.6 1.7 - 7.7 K/uL   Lymphocytes Relative 26 %    Lymphs Abs 1.8 0.7 - 4.0 K/uL   Monocytes Relative 5 %   Monocytes Absolute 0.4 0.1 - 1.0 K/uL   Eosinophils Relative 4 %   Eosinophils Absolute 0.3 0.0 - 0.5 K/uL   Basophils Relative 0 %   Basophils Absolute 0.0 0.0 - 0.1 K/uL   Immature Granulocytes 0 %   Abs Immature Granulocytes 0.02 0.00 - 0.07 K/uL    Comment: Performed at [facility], [address] [place], [place] [address]  Comprehensive  metabolic panel     Status: Abnormal   Collection Time: 03/24/19 11:23 AM  Result Value Ref Range   Sodium 137 135 - 145 mmol/L   Potassium 3.9 3.5 - 5.1 mmol/L   Chloride 105 98 - 111 mmol/L   CO2 22 22 - 32 mmol/L   Glucose, Bld 141 (H) 70 - 99 mg/dL   BUN 15 6 - 20 mg/dL   Creatinine, Ser 1.610.78 0.44 - 1.00 mg/dL   Calcium 8.9 8.9 - 09.610.3 mg/dL   Total Protein 8.0 6.5 - 8.1 g/dL   Albumin 4.0 3.5 - 5.0 g/dL   AST 30 15 - 41 U/L   ALT 33 0 - 44 U/L   Alkaline Phosphatase 107 38 - 126 U/L   Total Bilirubin 0.4 0.3 - 1.2 mg/dL   GFR calc non Af Amer >60 >60 mL/min   GFR calc Af Amer >60 >60 mL/min   Anion gap 10 5 - 15    Comment: Performed at [facility], [address] [place], [place] [address]  Troponin I (High Sensitivity)     Status: None   Collection Time: 03/24/19 11:23 AM  Result Value Ref Range   Troponin I (High Sensitivity) <2.0 <18 ng/L    Comment: Performed at [facility], [address] [place], [place] [address]  TSH     Status: None   Collection Time: 03/24/19 11:23 AM  Result Value Ref Range   TSH 1.734 0.350 - 4.500 uIU/mL    Comment: Performed by a 3rd Generation assay with a functional sensitivity of <=0.01 uIU/mL. Performed at [facility], [address] [place], [place] [address]     Assessment/Plan: Essential hypertension -Controlled -Discussed lifestyle modifications -Continue Lopressor 100 mg and HCTZ 25 mg -Given handout - Plan: hydrochlorothiazide (HYDRODIURIL) 25 MG tablet  MS (multiple sclerosis) (HCC) -Stable -Continue follow-up with  neurology  Intractable chronic migraine without aura and without status migrainosus  -Discussed staying hydrated, getting plenty of rest, limiting caffeine -Continue current meds -Continue follow-up with [place] [facility]  Gastroesophageal reflux disease, esophagitis presence not specified  -Continue omeprazole -Discussed avoiding foods known to cause symptoms -Weight loss advised  OSA treated with BiPAP -Discussed weight loss - Plan: Ambulatory referral to Pulmonology  Hypothyroidism, unspecified type  -Stable -TSH on 03/24/2019 normal at 1.734 - Plan: liothyronine (CYTOMEL) 5 MCG tablet, Ambulatory referral to Endocrinology  Seasonal allergies  -Continue OTC meds Allegra, cetirizine  Mild intermittent asthma without complication -Stable -Continue albuterol inhaler as needed -Avoid triggers - Plan: Ambulatory referral to pulmonology  Encounter to establish care  -We reviewed the PMH, PSH, FH, SH, Meds and Allergies. -We provided refills for any medications we will prescribe as needed. -We addressed current concerns per orders and patient instructions. -We have asked for records for pertinent exams, studies, vaccines and notes from previous providers. -We have advised patient to follow up per instructions below.  Palpitations  -continue evaluation with Cardiology -advised to limit caffeine intake  Type 2 diabetes mellitus without complication, unspecified whether long term insulin use (HCC) -Diet controlled -Unable to tolerate metformin -Hemoglobin A1c this visit 6.4% -Discussed lifestyle modifications -We will need yearly eye exam, U microalbumin/creatinine  - Plan: Ambulatory referral to Endocrinology, POCT glycosylated hemoglobin (Hb A1C)  Class 2 severe obesity due to excess calories with serious comorbidity and body mass index (BMI) of 38.0 to 38.9 in adult [facility](HCC) -Discussed the importance of increasing physical activity and diet modifications.   F/u prn in 2  months  Grier Mitts, MD

## 2019-04-13 ENCOUNTER — Ambulatory Visit (HOSPITAL_COMMUNITY): Payer: Self-pay | Attending: Orthopedic Surgery | Admitting: Physical Therapy

## 2019-04-13 ENCOUNTER — Encounter (HOSPITAL_COMMUNITY): Payer: Self-pay | Admitting: Physical Therapy

## 2019-04-13 ENCOUNTER — Other Ambulatory Visit: Payer: Self-pay

## 2019-04-13 DIAGNOSIS — R2681 Unsteadiness on feet: Secondary | ICD-10-CM

## 2019-04-13 DIAGNOSIS — M6281 Muscle weakness (generalized): Secondary | ICD-10-CM

## 2019-04-13 DIAGNOSIS — R2689 Other abnormalities of gait and mobility: Secondary | ICD-10-CM

## 2019-04-13 DIAGNOSIS — M25571 Pain in right ankle and joints of right foot: Secondary | ICD-10-CM

## 2019-04-13 NOTE — Therapy (Signed)
[facility] [address] [place], [place], [address] Phone: [phone]   Fax:  [phone]  Physical Therapy Evaluation  Patient Details  Name: Amy Lester MRN: [id number] Date of Birth: August 05, 1971 Referring Provider (PT): Fuller CanadaStanley Harrison    Encounter Date: 04/13/2019  PT End of Session - 04/13/19 1250    Visit Number  1    Number of Visits  9    Date for PT Re-Evaluation  05/11/19    Authorization Type  100% [facility], [employer] expiring 05/28/19    Authorization Time Period  04/13/19 to 05/11/19    Authorization - Visit Number  1    Authorization - Number of Visits  10    PT Start Time  1031   patient arrived late   PT Stop Time  1101    PT Time Calculation (min)  30 min    Activity Tolerance  Patient tolerated treatment well    Behavior During Therapy  [facility] for tasks assessed/performed       Past Medical History:  Diagnosis Date  . Asthma   . Blood transfusion without reported diagnosis   . Chronic migraine   . Common migraine with intractable migraine 02/16/2019  . Diabetes mellitus without complication (HCC)   . Endometriosis   . Fatty liver   . GERD (gastroesophageal reflux disease)   . Hyperlipidemia   . Hypertension   . Hypothyroidism   . Incontinence   . Irregular heartbeat   . Multiple sclerosis (HCC)   . Palpitations   . Sleep apnea     Past Surgical History:  Procedure Laterality Date  . ABDOMINAL HYSTERECTOMY    . CESAREAN SECTION     x1  . CHOLECYSTECTOMY    . ENDOMETRIAL ABLATION    . FOOT SURGERY Left 06/2018   foot reconstruction  . REPLACEMENT TOTAL KNEE Left     There were no vitals filed for this visit.   Subjective Assessment - 04/13/19 1032    Subjective  I [facility], now my right foot is doing exactly the same thing that my left was. It feels weak and loose on the outside, "flappy". On average, pain is 4-5/10. I do have some numbness and  tingling sometimes, not a lot but pokey like pins and needles on the back of my heel and on the outside of my heels. No ankle sprains or injuries to that foot but I do have MS.    Pertinent History  MS, hx of L foot reconstruction    Patient Stated Goals  be able to run (I have not run for the past year), take care of problems on the left    Currently in Pain?  Yes    Pain Score  4     Pain Location  Ankle    Pain Orientation  Right    Pain Descriptors / Indicators  Pins and needles;Other (Comment)   loose like something is flapping around   Pain Type  Chronic pain    Pain Radiating Towards  none    Pain Onset  More than a month ago    Pain Frequency  Constant    Aggravating Factors   daily walking around, squatting down, standing    Pain Relieving Factors  nothing    Effect of Pain on Daily Activities  moderate         [facility] PT Assessment - 04/13/19 0001  Assessment   Medical Diagnosis  R foot/ankle pain     Referring Provider (PT)  Fuller Canada     Onset Date/Surgical Date  --   within the past year    Next MD Visit  Dr. Romeo Apple in 4 weeks     Prior Therapy  PT for other foot, for shoulder      Precautions   Precautions  Other (comment)    Precaution Comments  cannot run       Restrictions   Weight Bearing Restrictions  No      Balance Screen   Has the patient fallen in the past 6 months  Yes    How many times?  3    Has the patient had a decrease in activity level because of a fear of falling?   Yes    Is the patient reluctant to leave their home because of a fear of falling?   No      Home Public house manager residence      Prior Function   Level of Independence  Independent    Vocation  Unemployed    Leisure  gardening, fitness classes, swiimming       Observation/Other Assessments   Observations  extreme bilateral genu recurvatum noted in static stance with torsion causing her to stand on lateral side of sole of both feet        AROM   AROM Assessment Site  Ankle    Right/Left Ankle  Right;Left    Right Ankle Dorsiflexion  -15    Right Ankle Plantar Flexion  50    Right Ankle Inversion  35    Right Ankle Eversion  8    Left Ankle Dorsiflexion  -10    Left Ankle Plantar Flexion  40    Left Ankle Inversion  25    Left Ankle Eversion  6      Strength   Strength Assessment Site  Hip;Knee;Ankle    Right/Left Hip  Right;Left    Right Hip Extension  3/5    Right Hip ABduction  5/5    Left Hip Extension  3/5    Left Hip ABduction  4+/5    Right/Left Knee  Right;Left    Right Knee Extension  4+/5    Left Knee Extension  4+/5    Right/Left Ankle  Right;Left    Right Ankle Dorsiflexion  4-/5    Right Ankle Inversion  4-/5    Right Ankle Eversion  4-/5    Left Ankle Dorsiflexion  4-/5    Left Ankle Inversion  4+/5    Left Ankle Eversion  4+/5      Palpation   Palpation comment  severe tenderness R foot- severe impairment R cuboid, distal achilles tendon, medial and lateral calcaneous; pockets of localized edema also noted throughout foot        Ambulation/Gait   Gait Pattern  Step-through pattern;Decreased step length - right;Decreased step length - left;Decreased dorsiflexion - right;Decreased dorsiflexion - left;Right genu recurvatum;Left genu recurvatum;Trendelenburg;Decreased trunk rotation;Trunk flexed                Objective measurements completed on examination: See above findings.              PT Education - 04/13/19 1248    Education Details  limited eval today due to time limitations, possible need for bilateral swedish knee cages due to extreme genu recurvatum, benefits of PT including use as pre-hab  if it is determined she does need surgery on R foot, POC next session    Person(s) Educated  Patient    Methods  Explanation    Comprehension  Verbalized understanding       PT Short Term Goals - 04/13/19 1258      PT SHORT TERM GOAL #1   Title  Patient to experience R  ankle/foot pain no more than 2/10 in order to improve QOL and gait pattern    Time  2    Period  Weeks    Status  New    Target Date  04/27/19      PT SHORT TERM GOAL #2   Title  Patient to be compliant with HEP, to be updated PRN    Time  2    Period  Weeks    Status  New        PT Long Term Goals - 04/13/19 1300      PT LONG TERM GOAL #1   Title  Patient to show an improvement of at least 1 grade in order to assist in reducing pain and improving gait/upright tolerance    Time  4    Period  Weeks    Status  New    Target Date  05/11/19      PT LONG TERM GOAL #2   Title  Patient to demonstrate at least a 10 degree reduction in bilateral genu recurvatum to reduce strain and abnormal forces on knee and ankle/foot joints    Time  4    Period  Weeks    Status  New      PT LONG TERM GOAL #3   Title  Patient to be able to reach at least neutral (0 degrees) with R ankle dorsiflexion, and will improve R ankle eversion by 10 degrees in order to reduce pain with weight bearing and improve gait mechanics    Time  4    Period  Weeks    Status  New      PT LONG TERM GOAL #4   Title  Patient to tolerate regular exercise program involving swimming and riding an exercise bike without increase in pain in order to improve QOL and personal health    Time  4    Period  Weeks    Status  New             Plan - 04/13/19 1252    Clinical Impression Statement  Ms. Sheppard Lester arrives to therapy reporting new issues with her right foot; she reports that she had similar issues with her left foot, which ultimately led to foot/ankle reconstruction at [facility] just over a year ago. She also reports a history of MS which appears to be well managed but does contribute to balance problems as well as intermittent visual issues and impaired muscle coordination. Examination reveals limited ROM in bilateral ankles (as expected L LE following her extensive surgery), multiple swollen and tender areas in the R foot,  functional muscle weakness, poor muscle coordination as evidenced by severe genu recurvatum in stance B, and gait impairment. Recommend trial of skilled PT services to attempt to address functional deficits, reduce pain, and return to optimal level of function moving forward.    Personal Factors and Comorbidities  Age;Fitness;Past/Current Experience;Comorbidity 3+;Education;Finances;Time since onset of injury/illness/exacerbation    Comorbidities  DM, MS, obesity, hypothyroidism, L total foot replacement    Examination-Activity Limitations  Locomotion Level;Transfers;Squat;Stairs;Stand    Examination-Participation Restrictions  Yard Work;Community  Activity;Shop    Stability/Clinical Decision Making  Unstable/Unpredictable    Clinical Decision Making  High    Rehab Potential  Fair    PT Frequency  2x / week    PT Duration  4 weeks    PT Treatment/Interventions  ADLs/Self Care Home Management;Biofeedback;Cryotherapy;Electrical Stimulation;Iontophoresis 4mg /ml Dexamethasone;Ultrasound;DME Instruction;Gait training;Stair training;Functional mobility training;Therapeutic activities;Therapeutic exercise;Balance training;Neuromuscular re-education;Patient/family education;Orthotic Fit/Training;Manual techniques;Passive range of motion;Energy conservation;Taping    PT Next Visit Plan  assign HEP, get formal measure on genu recurvatum. ankle ROM and strength, balance and general strength. Consider swedish knee cages for recurvatum.    PT Home Exercise Plan  not assigned at eval due to time limitations    Consulted and Agree with Plan of Care  Patient       Patient will benefit from skilled therapeutic intervention in order to improve the following deficits and impairments:  Abnormal gait, Decreased coordination, Difficulty walking, Obesity, Decreased activity tolerance, Pain, Decreased balance, Hypomobility, Improper body mechanics, Decreased mobility, Decreased strength, Increased edema, Postural  dysfunction  Visit Diagnosis: 1. Pain in right ankle and joints of right foot   2. Other abnormalities of gait and mobility   3. Unsteadiness on feet   4. Muscle weakness (generalized)        Problem List Patient Active Problem List   Diagnosis Date Noted  . Class 2 severe obesity due to excess calories with serious comorbidity and body mass index (BMI) of 38.0 to 38.9 in adult ([place]) 04/12/2019  . Type 2 diabetes mellitus without complication ([place]) 70/62/3762  . Palpitations 04/12/2019  . Mild intermittent asthma without complication 83/15/1761  . Seasonal allergies 04/12/2019  . Hypothyroidism 04/12/2019  . OSA treated with BiPAP 04/12/2019  . Gastroesophageal reflux disease 04/12/2019  . Essential hypertension 04/12/2019  . Intractable chronic migraine without aura 02/16/2019  . MS (multiple sclerosis) ([place]) 02/16/2019    Deniece Ree PT, DPT, CBIS  Supplemental Physical Therapist [place]    Pager [phone] Acute Rehab Office [facility] [address] [place], [place], [address] Phone: [phone]   Fax:  [phone]  Name: Amy Lester MRN: [id number] Date of Birth: March 01, 1971

## 2019-04-14 ENCOUNTER — Encounter (HOSPITAL_COMMUNITY): Payer: Self-pay | Admitting: Physical Therapy

## 2019-04-14 ENCOUNTER — Encounter: Payer: Self-pay | Admitting: Family Medicine

## 2019-04-14 ENCOUNTER — Ambulatory Visit (HOSPITAL_COMMUNITY): Payer: Self-pay | Admitting: Physical Therapy

## 2019-04-14 ENCOUNTER — Inpatient Hospital Stay
Admission: RE | Admit: 2019-04-14 | Discharge: 2019-04-14 | Disposition: A | Discharge status: Home / Self Care | Payer: Medicaid Other | Source: Ambulatory Visit | Attending: Neurology | Admitting: Neurology

## 2019-04-14 ENCOUNTER — Ambulatory Visit (HOSPITAL_COMMUNITY): Payer: Self-pay

## 2019-04-14 DIAGNOSIS — R2681 Unsteadiness on feet: Secondary | ICD-10-CM

## 2019-04-14 DIAGNOSIS — M25571 Pain in right ankle and joints of right foot: Secondary | ICD-10-CM

## 2019-04-14 DIAGNOSIS — M6281 Muscle weakness (generalized): Secondary | ICD-10-CM

## 2019-04-14 DIAGNOSIS — R2689 Other abnormalities of gait and mobility: Secondary | ICD-10-CM

## 2019-04-14 NOTE — Patient Instructions (Signed)
   ANKLE CIRCLES  Move your ankle in a circular pattern 10 times; make sure you are only moving your foot/ankle, not your whole leg or your hip.  Repeat 10 times clockwise, then 10 times counterclockwise each ankle.  Repeat 1 time, 3 times daily.      ANKLE ABC's   While in a seated position, write out the alphabet in the air with your big toe.  Your ankle should be moving as you perform this- not your whole leg and hip.  Repeat 1 time each side, 3 times per day.     CALF STRETCH WITH TOWEL - GASTROCNEMIUS  While in a seated position, place a towel around the ball of your foot and pull your ankle back until a stretch is felt on your calf area.  Keep your knee in a straightened position during the stretch.  Hold for 30 seconds, then relax.  Repeat 3 times each side, 2-3 times per day.

## 2019-04-14 NOTE — Discharge Instructions (Signed)

## 2019-04-14 NOTE — Therapy (Signed)
[facility] [address] [place], [place], [address] Phone: [phone]   Fax:  [phone]  Physical Therapy Treatment  Patient Details  Name: Amy Lester MRN: [id number] Date of Birth: [birth year] Referring Provider (PT): Fuller CanadaStanley Harrison    Encounter Date: 04/14/2019  PT End of Session - 04/14/19 1212    Visit Number  2    Number of Visits  9    Date for PT Re-Evaluation  05/11/19    Authorization Type  100% [facility], [employer]FA expiring 05/28/19    Authorization Time Period  04/13/19 to 05/11/19    Authorization - Visit Number  2    Authorization - Number of Visits  10    PT Start Time  1116    PT Stop Time  1158    PT Time Calculation (min)  42 min    Activity Tolerance  Patient tolerated treatment well    Behavior During Therapy  [facility] for tasks assessed/performed       Past Medical History:  Diagnosis Date  . Asthma   . Blood transfusion without reported diagnosis   . Chronic migraine   . Common migraine with intractable migraine 02/16/2019  . Diabetes mellitus without complication (HCC)   . Endometriosis   . Fatty liver   . GERD (gastroesophageal reflux disease)   . Hyperlipidemia   . Hypertension   . Hypothyroidism   . Incontinence   . Irregular heartbeat   . Multiple sclerosis (HCC)   . Palpitations   . Sleep apnea     Past Surgical History:  Procedure Laterality Date  . ABDOMINAL HYSTERECTOMY    . CESAREAN SECTION     x1  . CHOLECYSTECTOMY    . ENDOMETRIAL ABLATION    . FOOT SURGERY Left 06/2018   foot reconstruction  . REPLACEMENT TOTAL KNEE Left     There were no vitals filed for this visit.  Subjective Assessment - 04/14/19 1209    Subjective  No changes since yesterday    Pertinent History  MS, hx of L foot reconstruction    Patient Stated Goals  be able to run (I have not run for the past year), take care of problems on the left    Currently in Pain?  Yes    Pain Score  4     Pain Location   Ankle    Pain Orientation  Right    Pain Descriptors / Indicators  Pins and needles;Other (Comment)   like something loose and "flapping"   Pain Type  Chronic pain                       [facility] Adult PT Treatment/Exercise - 04/14/19 0001      Exercises   Exercises  Ankle      Ankle Exercises: Stretches   Gastroc Stretch  Other (comment)    Gastroc Stretch Limitations  5x60 seconds R foot, 5x30 seconds L foot       Ankle Exercises: Seated   ABC's  1 rep    ABC's Limitations  B    Ankle Circles/Pumps  Both;10 reps    Ankle Circles/Pumps Limitations  CW and CCW     Other Seated Ankle Exercises  attempted towel crunches, difficulty with form    Other Seated Ankle Exercises  Lester raises with heavy cues for form 1x10 R       Ankle Exercises: Supine   Other Supine Ankle Exercises  4  way ankle exercises in supine with red TB 1x10 B each direction              PT Education - 04/14/19 1211    Education Details  reviewed initial eval and goals; exercise form and purpose; will likley have soreness and pulling with exercises due to long term inversion/plantar flexion of foot and ROM limitations; HEP; ice bath for B feet following PT    Person(s) Educated  Patient    Methods  Explanation;Handout;Demonstration    Comprehension  Verbalized understanding;Need further instruction       PT Short Term Goals - 04/13/19 1258      PT SHORT TERM GOAL #1   Title  Patient to experience R ankle/foot pain no more than 2/10 in order to improve QOL and gait pattern    Time  2    Period  Weeks    Status  New    Target Date  04/27/19      PT SHORT TERM GOAL #2   Title  Patient to be compliant with HEP, to be updated PRN    Time  2    Period  Weeks    Status  New        PT Long Term Goals - 04/13/19 1300      PT LONG TERM GOAL #1   Title  Patient to show an improvement of at least 1 grade in order to assist in reducing pain and improving gait/upright tolerance    Time  4     Period  Weeks    Status  New    Target Date  05/11/19      PT LONG TERM GOAL #2   Title  Patient to demonstrate at least a 10 degree reduction in bilateral genu recurvatum to reduce strain and abnormal forces on knee and ankle/foot joints    Time  4    Period  Weeks    Status  New      PT LONG TERM GOAL #3   Title  Patient to be able to reach at least neutral (0 degrees) with R ankle dorsiflexion, and will improve R ankle eversion by 10 degrees in order to reduce pain with weight bearing and improve gait mechanics    Time  4    Period  Weeks    Status  New      PT LONG TERM GOAL #4   Title  Patient to tolerate regular exercise program involving swimming and riding an exercise bike without increase in pain in order to improve QOL and personal health    Time  4    Period  Weeks    Status  New            Plan - 04/14/19 1213    Clinical Impression Statement  Amy Lester arrives with no significant changes since prior session yesterday. Introduced bilateral gastroc stretching as tolerated in supine with towels tucked behind bilateral knees to help control genu recurvatum. Also introduced 4 way ankle strengthening with red TB, cues for correct form and smoothness of exercise. Finished session with seated exercises including ankle circles, ankle alphabet, and attempted towel crunches and Lester raises with significant difficulty noted with these two tasks. Extended time and cues required during all exercises today due to general irritability of B feet/ankles.    Personal Factors and Comorbidities  Age;Fitness;Past/Current Experience;Comorbidity 3+;Education;Finances;Time since onset of injury/illness/exacerbation    Comorbidities  DM, MS, obesity, hypothyroidism, L total foot reconstruction  Examination-Activity Limitations  Locomotion Level;Transfers;Squat;Stairs;Stand    Examination-Participation Restrictions  Yard Work;Community Activity;Shop    Stability/Clinical Decision Making   Unstable/Unpredictable    Clinical Decision Making  High    Rehab Potential  Fair    PT Frequency  2x / week    PT Duration  4 weeks    PT Treatment/Interventions  ADLs/Self Care Home Management;Biofeedback;Cryotherapy;Electrical Stimulation;Iontophoresis 4mg /ml Dexamethasone;Ultrasound;DME Instruction;Gait training;Stair training;Functional mobility training;Therapeutic activities;Therapeutic exercise;Balance training;Neuromuscular re-education;Patient/family education;Orthotic Fit/Training;Manual techniques;Passive range of motion;Energy conservation;Taping    PT Next Visit Plan  review  HEP/check form. Get formal measure on genu recurvatum. Consider swedish knee cages for recurvatum. Ankle ROM and strength, balance and general strength.    PT Home Exercise Plan  Eval: ankle circles, ankle alphabet, gastroc stretches with bolster under knees for recurvatum    Consulted and Agree with Plan of Care  Patient       Patient will benefit from skilled therapeutic intervention in order to improve the following deficits and impairments:  Abnormal gait, Decreased coordination, Difficulty walking, Obesity, Decreased activity tolerance, Pain, Decreased balance, Hypomobility, Improper body mechanics, Decreased mobility, Decreased strength, Increased edema, Postural dysfunction  Visit Diagnosis: 1. Pain in right ankle and joints of right foot   2. Other abnormalities of gait and mobility   3. Unsteadiness on feet   4. Muscle weakness (generalized)        Problem List Patient Active Problem List   Diagnosis Date Noted  . Class 2 severe obesity due to excess calories with serious comorbidity and body mass index (BMI) of 38.0 to 38.9 in adult ([facility]) 04/12/2019  . Type 2 diabetes mellitus without complication ([facility]) 04/12/2019  . Palpitations 04/12/2019  . Mild intermittent asthma without complication 04/12/2019  . Seasonal allergies 04/12/2019  . Hypothyroidism 04/12/2019  . OSA treated with BiPAP  04/12/2019  . Gastroesophageal reflux disease 04/12/2019  . Essential hypertension 04/12/2019  . Intractable chronic migraine without aura 02/16/2019  . MS (multiple sclerosis) ([facility]) 02/16/2019    Nedra Hai PT, DPT, CBIS  Supplemental Physical Therapist [facility]    Pager [phone] Acute Rehab Office [phone]   [facility] [address] [place], [place], [address] Phone: [phone]   Fax:  [phone]  Name: Amy Lester MRN: [id number] Date of Birth: 05/26/1971

## 2019-04-17 ENCOUNTER — Ambulatory Visit (HOSPITAL_COMMUNITY): Payer: Self-pay

## 2019-04-17 ENCOUNTER — Encounter (HOSPITAL_COMMUNITY): Payer: Self-pay

## 2019-04-17 ENCOUNTER — Other Ambulatory Visit: Payer: Self-pay

## 2019-04-17 DIAGNOSIS — R2681 Unsteadiness on feet: Secondary | ICD-10-CM

## 2019-04-17 DIAGNOSIS — M25571 Pain in right ankle and joints of right foot: Secondary | ICD-10-CM

## 2019-04-17 DIAGNOSIS — M6281 Muscle weakness (generalized): Secondary | ICD-10-CM

## 2019-04-17 DIAGNOSIS — R2689 Other abnormalities of gait and mobility: Secondary | ICD-10-CM

## 2019-04-17 NOTE — Therapy (Signed)
[facility] [address] [place], [place], [address] Phone: [phone]   Fax:  [phone]  Physical Therapy Treatment  Patient Details  Name: Amy SeraRuthann C. Amy Lester Amy Lester MRN: [id number] Date of Birth: Mar 08, 1971 Referring Provider (PT): Fuller CanadaStanley Harrison    Encounter Date: 04/17/2019  PT End of Session - 04/17/19 1515    Visit Number  3    Number of Visits  9    Date for PT Re-Evaluation  05/11/19    Authorization Type  100% [facility], CAFA expiring 05/28/19    Authorization Time Period  04/13/19 to 05/11/19    Authorization - Visit Number  3    Authorization - Number of Visits  10    PT Start Time  1418    PT Stop Time  1500    PT Time Calculation (min)  42 min    Activity Tolerance  Patient tolerated treatment well    Behavior During Therapy  [facility] for tasks assessed/performed       Past Medical History:  Diagnosis Date  . Asthma   . Blood transfusion without reported diagnosis   . Chronic migraine   . Common migraine with intractable migraine 02/16/2019  . Diabetes mellitus without complication (HCC)   . Endometriosis   . Fatty liver   . GERD (gastroesophageal reflux disease)   . Hyperlipidemia   . Hypertension   . Hypothyroidism   . Incontinence   . Irregular heartbeat   . Multiple sclerosis (HCC)   . Palpitations   . Sleep apnea     Past Surgical History:  Procedure Laterality Date  . ABDOMINAL HYSTERECTOMY    . CESAREAN SECTION     x1  . CHOLECYSTECTOMY    . ENDOMETRIAL ABLATION    . FOOT SURGERY Left 06/2018   foot reconstruction  . REPLACEMENT TOTAL KNEE Left     There were no vitals filed for this visit.  Subjective Assessment - 04/17/19 1419    Subjective  Pt reports 4-5/10 R heel/lateral ankle pain today. Pt wants to clarify that L foot reconstruction surgery was in 2019 due to initial subjective report saying "years ago".    Pertinent History  MS, hx of L foot reconstruction 2019    Patient Stated Goals  be  able to run (I have not run for the past year), take care of problems on the left    Currently in Pain?  Yes    Pain Score  5     Pain Location  Ankle    Pain Orientation  Right    Pain Descriptors / Indicators  Dull;Aching;Other (Comment)   pulling, flapping, weak   Pain Type  Chronic pain    Pain Onset  More than a month ago    Pain Frequency  Constant    Aggravating Factors   daily walking around, squatting down, standing    Pain Relieving Factors  nothing    Effect of Pain on Daily Activities  moderate            [facility] Adult PT Treatment/Exercise - 04/17/19 0001      Manual Therapy   Manual Therapy  Soft tissue mobilization    Manual therapy comments  completed seperate from rest of treatment interventions    Soft tissue mobilization  STM to R plantar surface, achilles tendon, distal calf musculature to decrease palpable restrictions and improve pain      Ankle Exercises: Seated   ABC's  1 rep  Heel Raises  Both;15 reps    Toe Raise  15 reps    Other Seated Ankle Exercises  --    Other Seated Ankle Exercises  arch raises, x15 reps, tactile cues with pen and sticker to maintain big toe contact      Ankle Exercises: Supine   Other Supine Ankle Exercises  4 way ankle exercises, seated,RTB x10 reps each direction              PT Education - 04/17/19 1513    Education Details  exercise technique, updated HEP    Person(s) Educated  Patient    Methods  Explanation;Demonstration;Handout    Comprehension  Verbalized understanding;Returned demonstration       PT Short Term Goals - 04/17/19 1434      PT SHORT TERM GOAL #1   Title  Patient to experience R ankle/foot pain no more than 2/10 in order to improve QOL and gait pattern    Time  2    Period  Weeks    Status  On-going    Target Date  04/27/19      PT SHORT TERM GOAL #2   Title  Patient to be compliant with HEP, to be updated PRN    Time  2    Period  Weeks    Status  On-going        PT Long  Term Goals - 04/17/19 1434      PT LONG TERM GOAL #1   Title  Patient to show an improvement of at least 1 grade in order to assist in reducing pain and improving gait/upright tolerance    Time  4    Period  Weeks    Status  On-going      PT LONG TERM GOAL #2   Title  Patient to demonstrate at least a 10 degree reduction in bilateral genu recurvatum to reduce strain and abnormal forces on knee and ankle/foot joints    Time  4    Period  Weeks    Status  On-going      PT LONG TERM GOAL #3   Title  Patient to be able to reach at least neutral (0 degrees) with R ankle dorsiflexion, and will improve R ankle eversion by 10 degrees in order to reduce pain with weight bearing and improve gait mechanics    Time  4    Period  Weeks    Status  On-going      PT LONG TERM GOAL #4   Title  Patient to tolerate regular exercise program involving swimming and riding an exercise bike without increase in pain in order to improve QOL and personal health    Time  4    Period  Weeks    Status  On-going            Plan - 04/17/19 1515    Clinical Impression Statement  Continued with pt's established POC to improve R ankle strength, AROM and reduce pain. Pt able to perform seated R ankle strengthening exercises with verbal cues for form and occasional reports of "flapping pain" on lateral side of R foot back towards heel. Pt able to actively invert, evert, plantarflex, and dorsiflex R ankle in all directions and denies pain with resisted inversion/eversion, but does report pain with dorsiflexion in calf area and PT educated pt on stretching gastroc/soleus muscle with that motion. Pt fatigues with arch doming exercises and only able to perform 15 reps before needing a  rest break. Ended treatment session with STM to plantar surface, achilles tendon, and medial and lateral sides of R foot, but pt only tolerates minimal pressure to lateral side, achilles tendon, and directly under calcaneous. Possible  peroneal tendonitis or plantar fasciitis and educated pt on both, but unable to pinpoint direct increases of pain with movement, more so pain complaints with pinpoint palpation. Continue to progress as able.    Personal Factors and Comorbidities  Age;Fitness;Past/Current Experience;Comorbidity 3+;Education;Finances;Time since onset of injury/illness/exacerbation    Comorbidities  DM, MS, obesity, hypothyroidism, L total foot reconstruction    Examination-Activity Limitations  Locomotion Level;Transfers;Squat;Stairs;Stand    Examination-Participation Restrictions  Yard Work;Community Activity;Shop    Stability/Clinical Decision Making  Unstable/Unpredictable    Rehab Potential  Fair    PT Frequency  2x / week    PT Duration  4 weeks    PT Treatment/Interventions  ADLs/Self Care Home Management;Biofeedback;Cryotherapy;Electrical Stimulation;Iontophoresis 4mg /ml Dexamethasone;Ultrasound;DME Instruction;Gait training;Stair training;Functional mobility training;Therapeutic activities;Therapeutic exercise;Balance training;Neuromuscular re-education;Patient/family education;Orthotic Fit/Training;Manual techniques;Passive range of motion;Energy conservation;Taping    PT Next Visit Plan  Continue strengthening for ankle, ankle ROM, stretching throughout calf and plantar fascia. Initiate balance exercises. Work within pt tolerated range and watch fatigue.    PT Home Exercise Plan  Eval: ankle circles, ankle alphabet, gastroc stretches with bolster under knees for recurvatum; 8/10: arch doming    Consulted and Agree with Plan of Care  Patient       Patient will benefit from skilled therapeutic intervention in order to improve the following deficits and impairments:  Abnormal gait, Decreased coordination, Difficulty walking, Obesity, Decreased activity tolerance, Pain, Decreased balance, Hypomobility, Improper body mechanics, Decreased mobility, Decreased strength, Increased edema, Postural dysfunction  Visit  Diagnosis: 1. Pain in right ankle and joints of right foot   2. Other abnormalities of gait and mobility   3. Unsteadiness on feet   4. Muscle weakness (generalized)        Problem List Patient Active Problem List   Diagnosis Date Noted  . Class 2 severe obesity due to excess calories with serious comorbidity and body mass index (BMI) of 38.0 to 38.9 in adult ([place]) 04/12/2019  . Type 2 diabetes mellitus without complication ([place]) 47/82/9562  . Palpitations 04/12/2019  . Mild intermittent asthma without complication 13/04/6577  . Seasonal allergies 04/12/2019  . Hypothyroidism 04/12/2019  . OSA treated with BiPAP 04/12/2019  . Gastroesophageal reflux disease 04/12/2019  . Essential hypertension 04/12/2019  . Intractable chronic migraine without aura 02/16/2019  . MS (multiple sclerosis) ([place]) 02/16/2019     Talbot Grumbling PT, DPT 04/17/19, 3:33 PM [place], [place], [address] Phone: [phone]   Fax:  [phone]  Name: Amy Amy Lester MRN: [id number] Date of Birth: Sep 22, 1970

## 2019-04-18 ENCOUNTER — Ambulatory Visit (HOSPITAL_COMMUNITY)
Admission: RE | Admit: 2019-04-18 | Discharge: 2019-04-18 | Disposition: A | Discharge status: Home / Self Care | Payer: Self-pay | Source: Ambulatory Visit | Attending: Physician Assistant | Admitting: Physician Assistant

## 2019-04-18 DIAGNOSIS — N644 Mastodynia: Secondary | ICD-10-CM | POA: Insufficient documentation

## 2019-04-18 IMAGING — US ULTRASOUND RIGHT BREAST LIMITED
1 series · 7 of 7 positions shown · non-contrast
Comparison: [DATE]

CLINICAL DATA: Patient presents with a tender lump along the far
lateral aspect of the right breast as well as a pulling sensation in
her left axilla.

EXAM:
DIGITAL DIAGNOSTIC BILATERAL MAMMOGRAM WITH CAD AND TOMO
ULTRASOUND RIGHT BREAST
ULTRASOUND LEFT AXILLA

[Series 1: ultrasound right breast limited · 0.07mm/px · 7 of 7 slices shown]
[im 1/7]
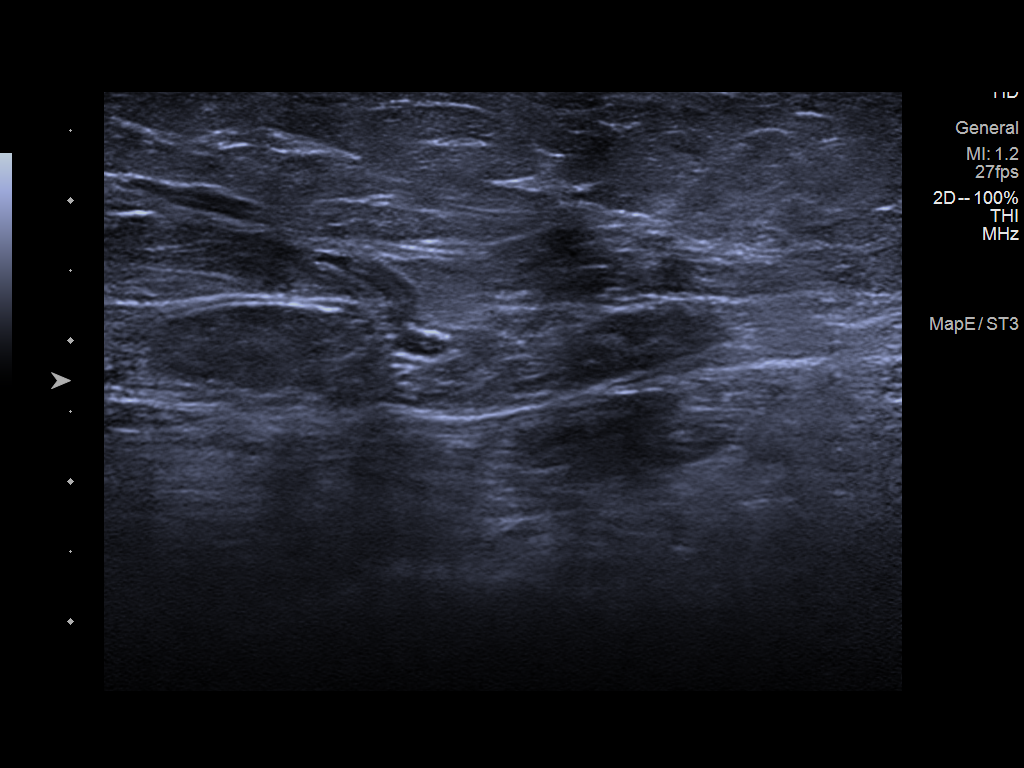
[im 2/7]
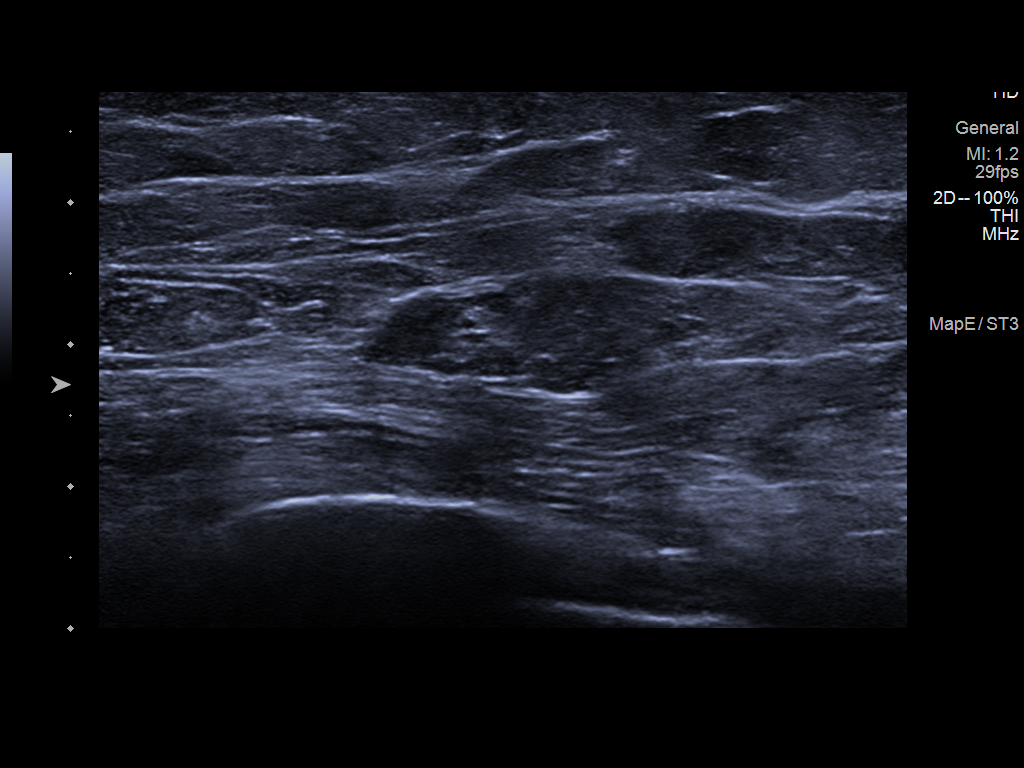
[im 3/7]
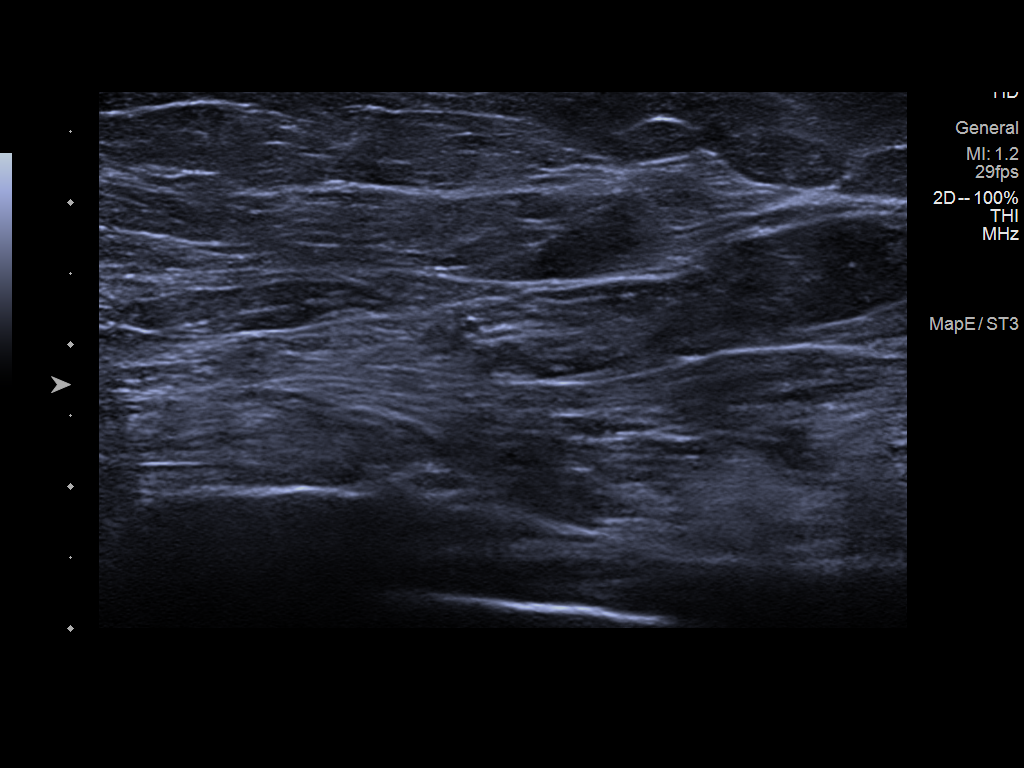
[im 4/7]
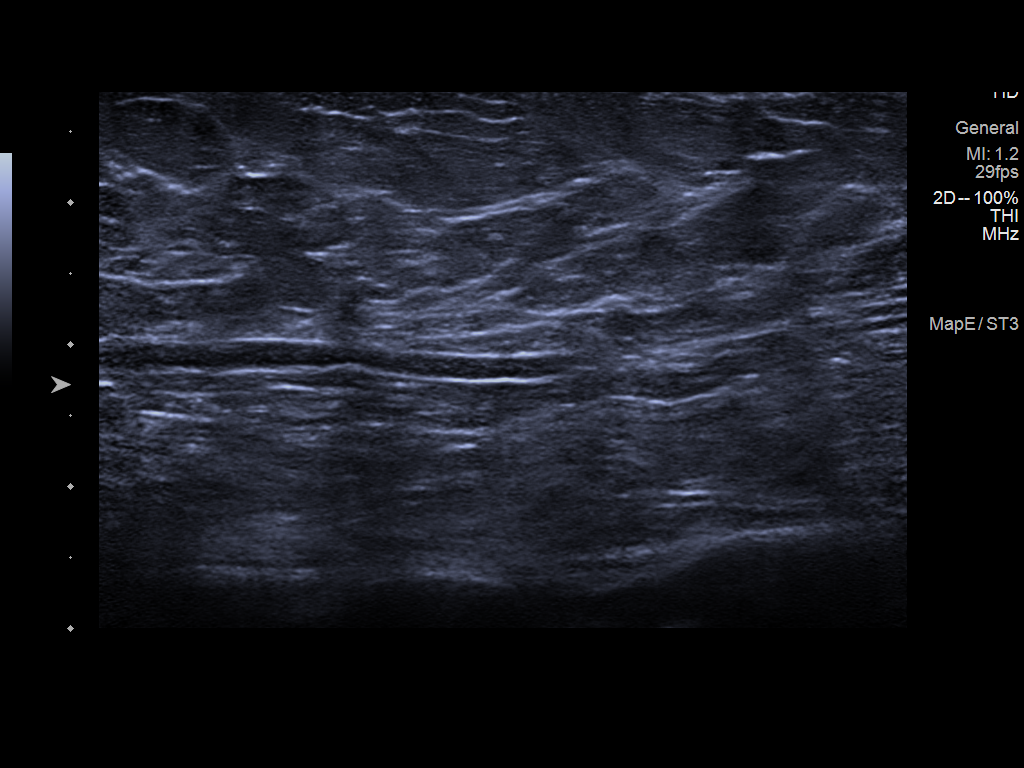
[im 5/7]
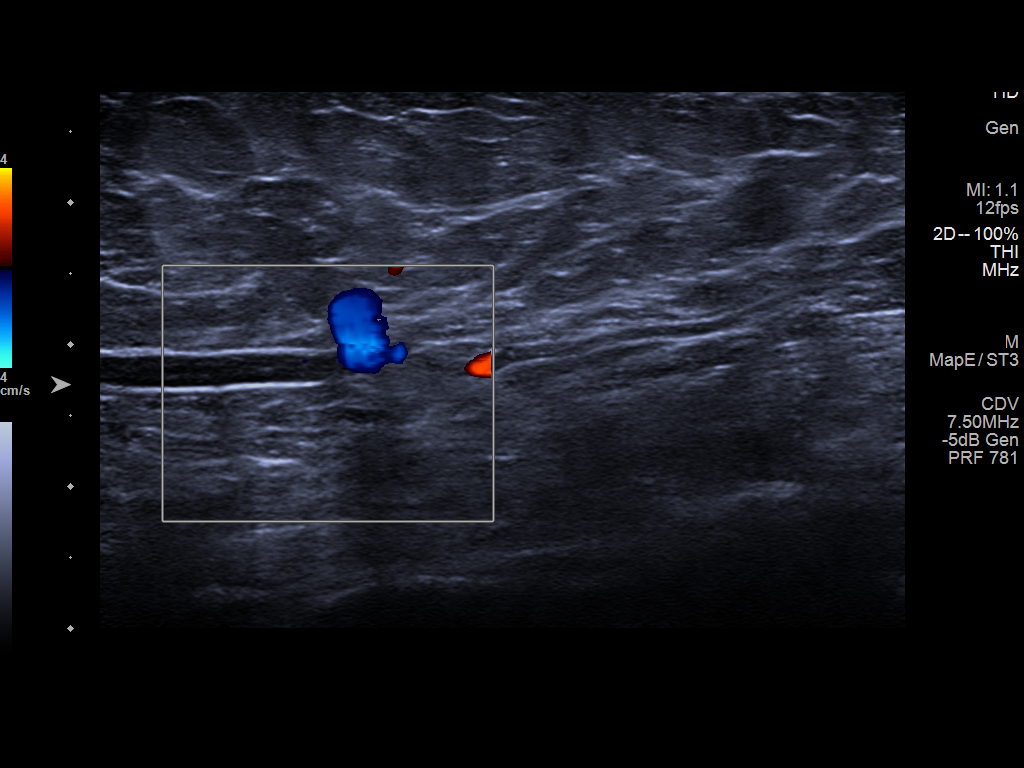
[im 6/7]
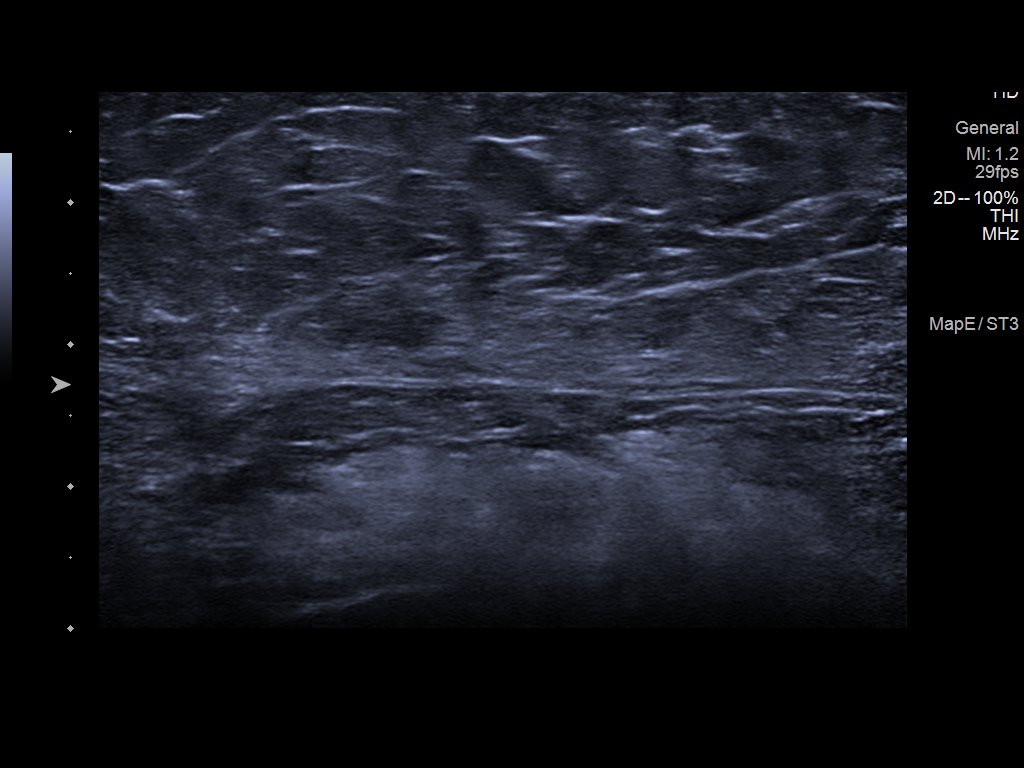
[im 7/7]
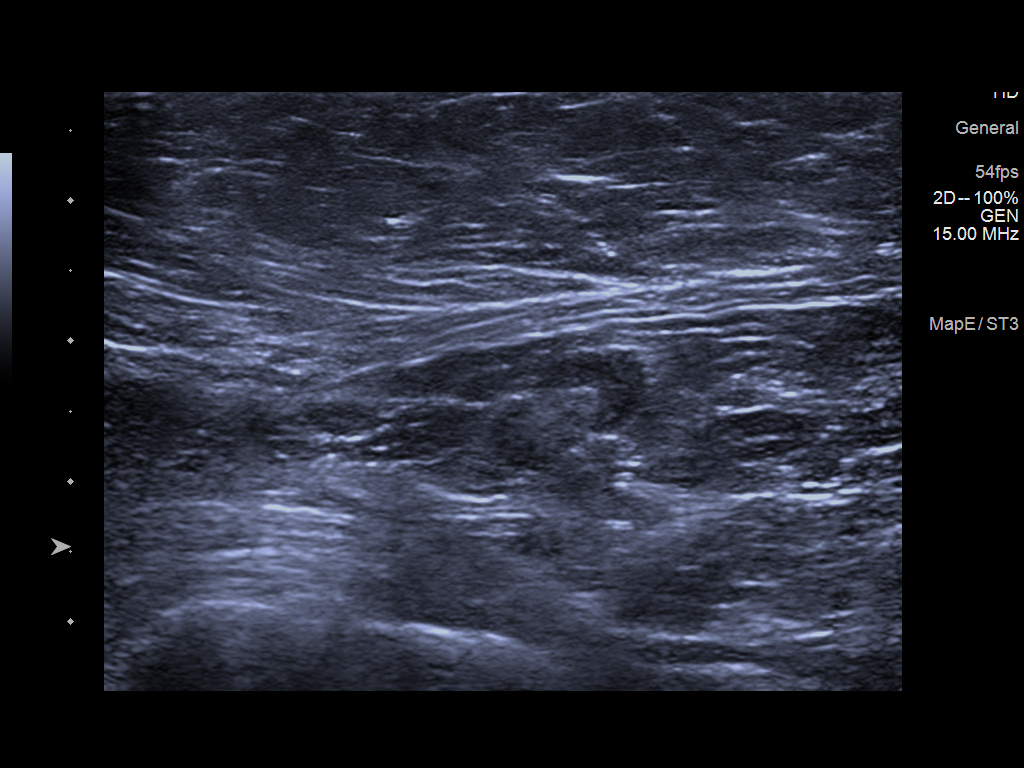

[7 of 7 positions shown; findings below may reference images not displayed]

ACR Breast Density Category b: There are scattered areas of
fibroglandular density.
FINDINGS: There are no masses, areas of architectural distortion, areas of
significant asymmetry or suspicious calcifications. No mammographic
change.

Mammographic images were processed with CAD.

On physical exam, no mass is palpated along the far lateral aspect
of the right breast. Patient is tender to palpation. No left
axillary masses.

Targeted right breast ultrasound is performed, showing normal tissue
along the far lateral aspect of the right breast in the area of the
reported lump and tenderness. No mass or suspicious lesion.

Targeted left axilla ultrasound is performed, showing normal tissue
throughout the left axilla. No enlarged or abnormal lymph nodes.
IMPRESSION: 1. No evidence of breast malignancy.
2. No mammographic or sonographic correlate to reported tender lump
in the far lateral aspect of the right breast.

RECOMMENDATION:
Screening mammogram in one year.(Code:[UU])

I have discussed the findings and recommendations with the patient.
Results were also provided in writing at the conclusion of the
visit. If applicable, a reminder letter will be sent to the patient
regarding the next appointment.

BI-RADS CATEGORY  1: Negative.

## 2019-04-18 IMAGING — MG DIGITAL DIAGNOSTIC BILATERAL MAMMOGRAM WITH TOMO AND CAD
6 of 12 series · 6 of 36 positions shown · non-contrast
Comparison: [DATE]

CLINICAL DATA: Patient presents with a tender lump along the far
lateral aspect of the right breast as well as a pulling sensation in
her left axilla.

EXAM:
DIGITAL DIAGNOSTIC BILATERAL MAMMOGRAM WITH CAD AND TOMO
ULTRASOUND RIGHT BREAST
ULTRASOUND LEFT AXILLA

[R TAN synth-2D (1 of 2)]
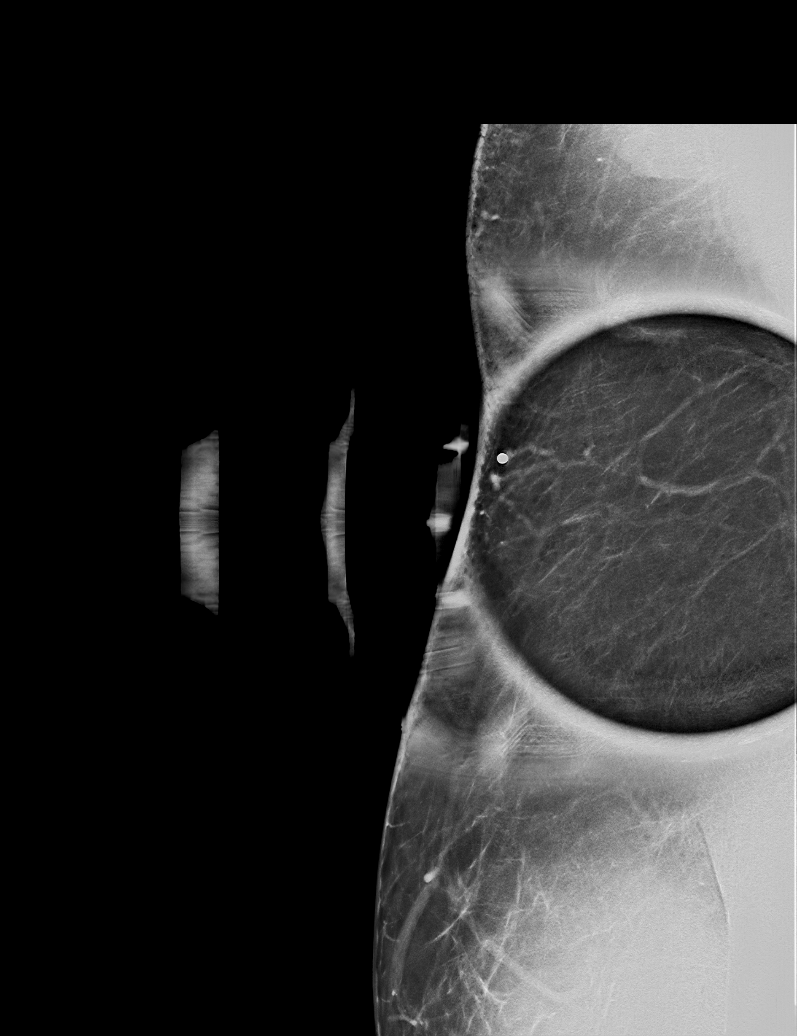

[R MLO synth-2D]
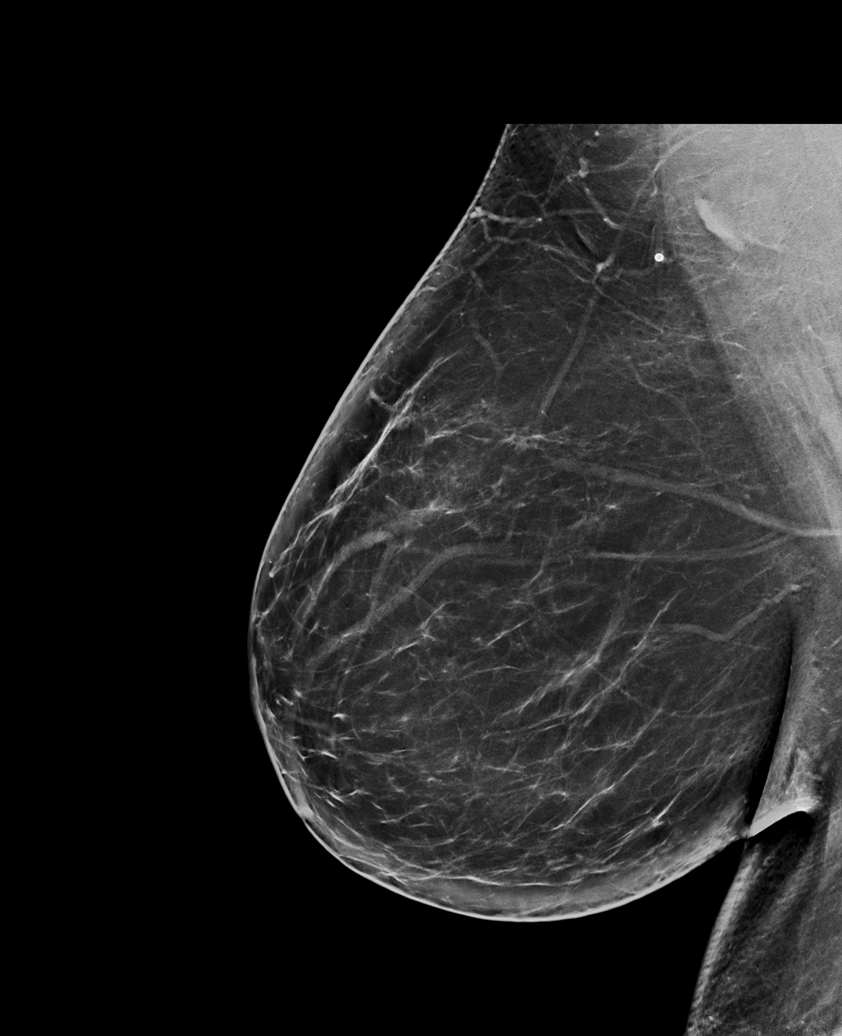

[R CC synth-2D]
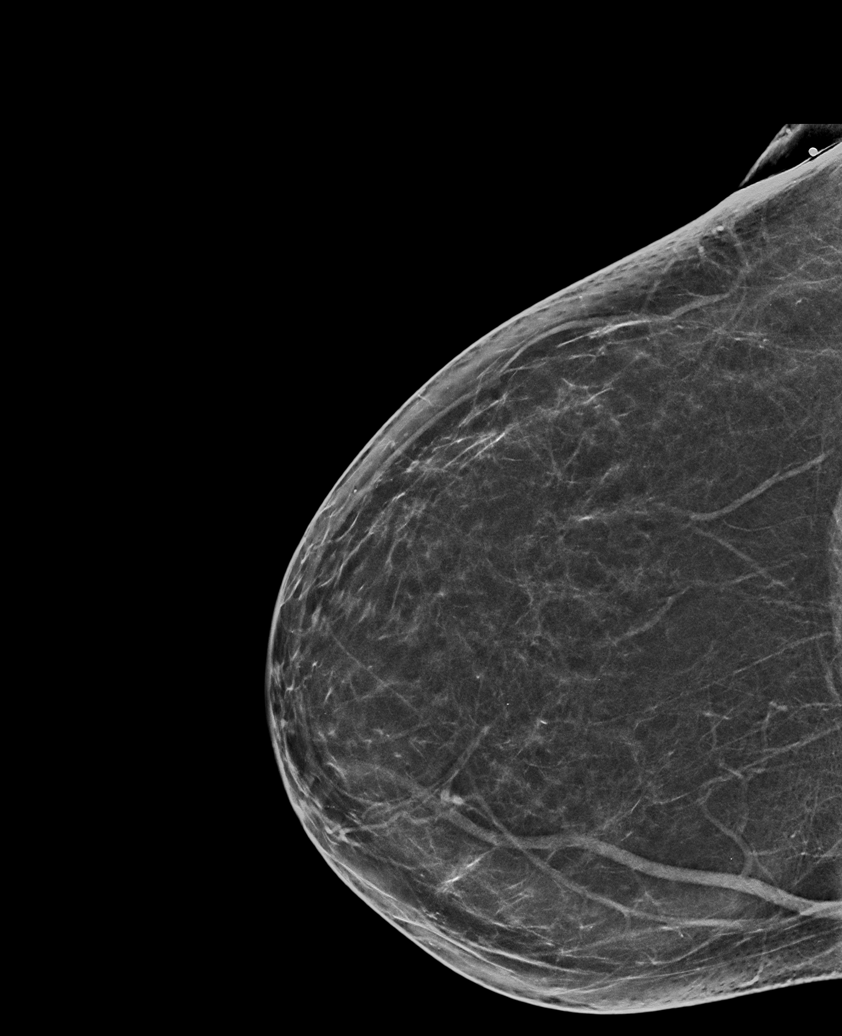

[L CC synth-2D]
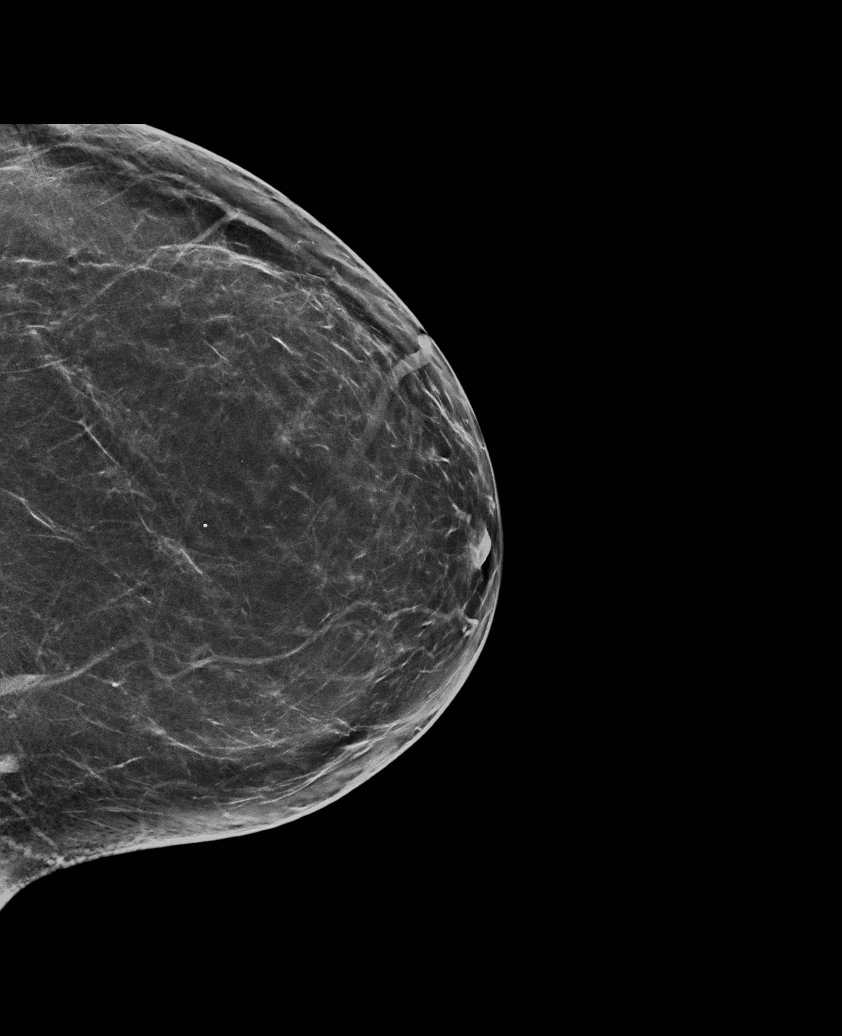

[R TAN synth-2D (2 of 2)]
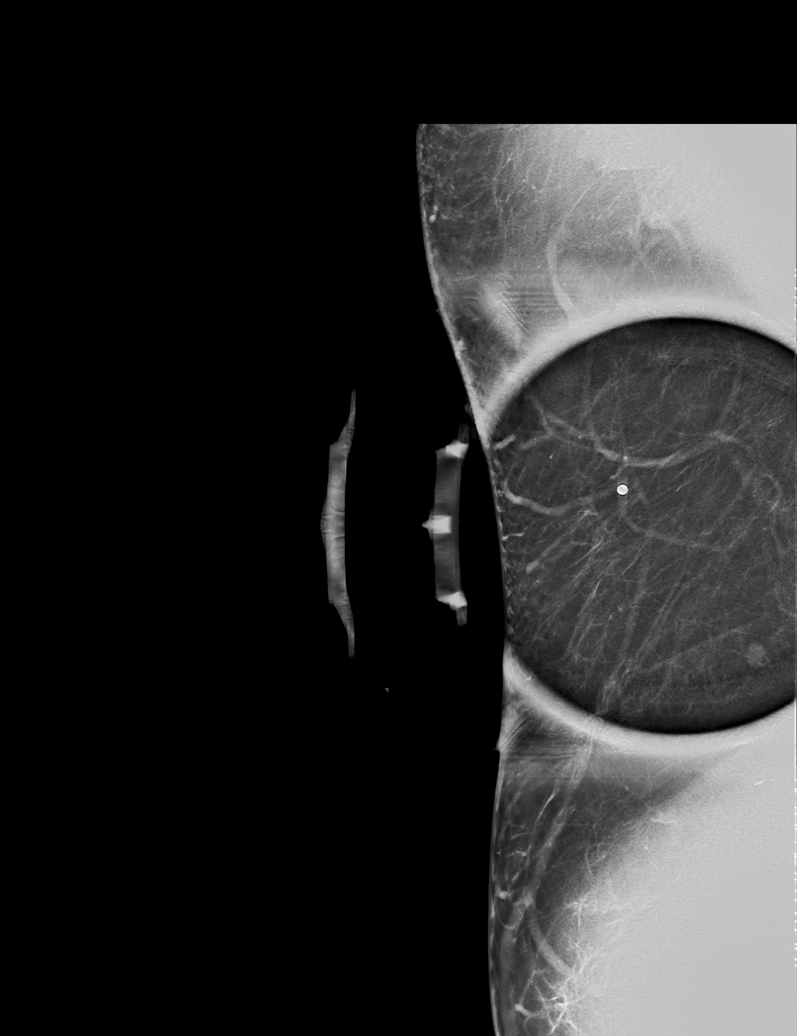

[L MLO synth-2D]
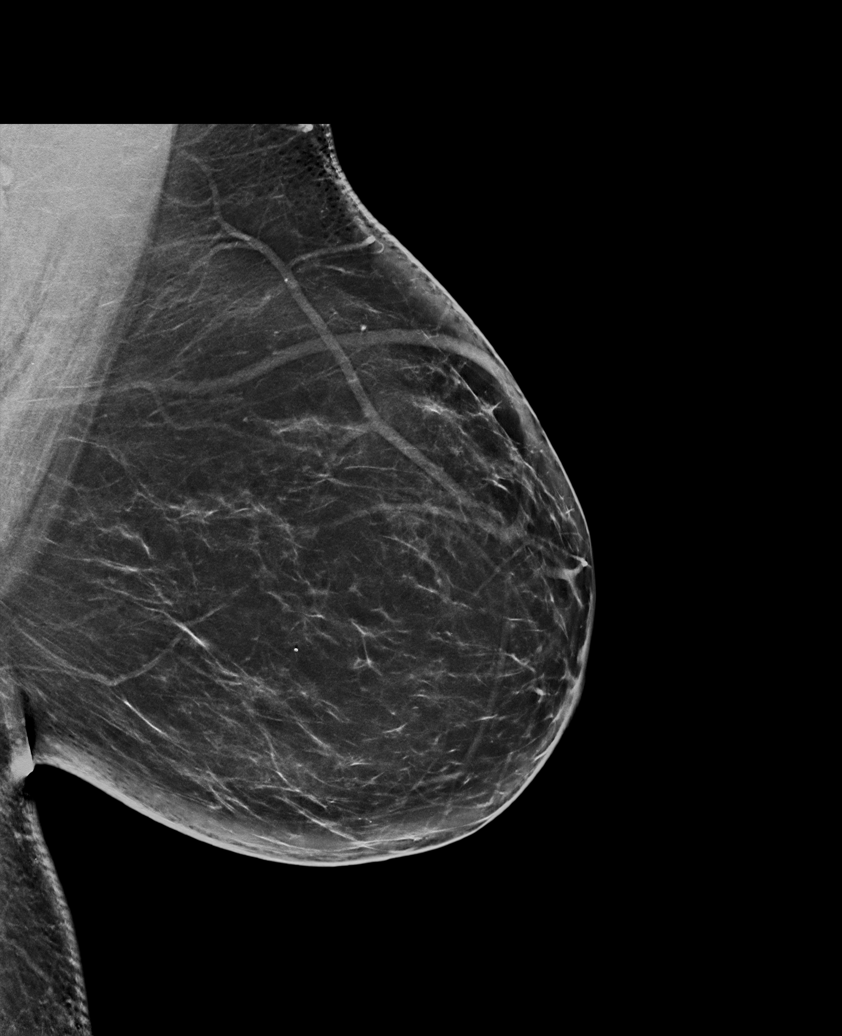

[6 of 36 positions shown; findings below may reference images not displayed]

ACR Breast Density Category b: There are scattered areas of
fibroglandular density.
FINDINGS: There are no masses, areas of architectural distortion, areas of
significant asymmetry or suspicious calcifications. No mammographic
change.

Mammographic images were processed with CAD.

On physical exam, no mass is palpated along the far lateral aspect
of the right breast. Patient is tender to palpation. No left
axillary masses.

Targeted right breast ultrasound is performed, showing normal tissue
along the far lateral aspect of the right breast in the area of the
reported lump and tenderness. No mass or suspicious lesion.

Targeted left axilla ultrasound is performed, showing normal tissue
throughout the left axilla. No enlarged or abnormal lymph nodes.
IMPRESSION: 1. No evidence of breast malignancy.
2. No mammographic or sonographic correlate to reported tender lump
in the far lateral aspect of the right breast.

RECOMMENDATION:
Screening mammogram in one year.(Code:[UU])

I have discussed the findings and recommendations with the patient.
Results were also provided in writing at the conclusion of the
visit. If applicable, a reminder letter will be sent to the patient
regarding the next appointment.

BI-RADS CATEGORY  1: Negative.

## 2019-04-25 ENCOUNTER — Other Ambulatory Visit: Payer: Self-pay

## 2019-04-25 ENCOUNTER — Encounter (HOSPITAL_COMMUNITY): Payer: Self-pay

## 2019-04-25 ENCOUNTER — Ambulatory Visit (HOSPITAL_COMMUNITY): Payer: Self-pay

## 2019-04-25 DIAGNOSIS — M6281 Muscle weakness (generalized): Secondary | ICD-10-CM

## 2019-04-25 DIAGNOSIS — R2689 Other abnormalities of gait and mobility: Secondary | ICD-10-CM

## 2019-04-25 DIAGNOSIS — M25571 Pain in right ankle and joints of right foot: Secondary | ICD-10-CM

## 2019-04-25 DIAGNOSIS — R2681 Unsteadiness on feet: Secondary | ICD-10-CM

## 2019-04-25 NOTE — Therapy (Signed)
[facility] [address] [place] [place], [place], [address] Phone: [phone]   Fax:  [phone]  Physical Therapy Treatment  Patient Details  Name: Amy SeraRuthann C. Sheppard PentonWolf MRN: [id number] Date of Birth: [birth year] Referring Provider (PT): Fuller CanadaStanley Harrison    Encounter Date: 04/25/2019  PT End of Session - 04/25/19 0933    Visit Number  4    Number of Visits  9    Date for PT Re-Evaluation  05/11/19    Authorization Type  [facility]% [facility], CAFA expiring 05/28/19    Authorization Time Period  04/13/19 to 05/11/19    Authorization - Visit Number  4    Authorization - Number of Visits  10    PT Start Time  0930   pt arrived late   PT Stop Time  1000    PT Time Calculation (min)  30 min    Activity Tolerance  Patient tolerated treatment well    Behavior During Therapy  [facility] for tasks assessed/performed       Past Medical History:  Diagnosis Date  . Asthma   . Blood transfusion without reported diagnosis   . Chronic migraine   . Common migraine with intractable migraine 02/16/2019  . Diabetes mellitus without complication (HCC)   . Endometriosis   . Fatty liver   . GERD (gastroesophageal reflux disease)   . Hyperlipidemia   . Hypertension   . Hypothyroidism   . Incontinence   . Irregular heartbeat   . Multiple sclerosis (HCC)   . Palpitations   . Sleep apnea     Past Surgical History:  Procedure Laterality Date  . ABDOMINAL HYSTERECTOMY    . CESAREAN SECTION     x1  . CHOLECYSTECTOMY    . ENDOMETRIAL ABLATION    . FOOT SURGERY Left 06/2018   foot reconstruction  . REPLACEMENT TOTAL KNEE Left     There were no vitals filed for this visit.  Subjective Assessment - 04/25/19 0932    Subjective  Pt reports had a great vacation for her son's wedding. Pt reports no difficulty with activities while there. Pt reports extremely fatigued from trip and doesn't notice pain currently.    Pertinent History  MS, hx of L foot reconstruction 2019     Patient Stated Goals  be able to run (I have not run for the past year), take care of problems on the left    Currently in Pain?  No/denies    Pain Onset  More than a month ago            [facility]/Exercise - 04/25/19 0001      Ankle Exercises: Stretches   Gastroc Stretch  5 reps;60 seconds    Gastroc Stretch Limitations  R foot, seated with towel      Ankle Exercises: Seated   Heel Raises  Both;15 reps    Toe Raise  15 reps    Other Seated Ankle Exercises  towel scrunches bil, 2x1 minute; rockerboard forward/backward and R/L, seated, x10 reps each direction    Other Seated Ankle Exercises  arch raises, 2x15 reps, tactile cues with verbal cues      Ankle Exercises: Supine   Other Supine Ankle Exercises  4 way ankle exercises, seated,RTB x10 reps each direction       Ankle Exercises: Standing   Other Standing Ankle Exercises  calf raises at counter for support, unable to fully rise  PT Education - 04/25/19 0933    Education Details  Exercise technique, updated HEP    Person(s) Educated  Patient    Methods  Explanation;Demonstration;Handout    Comprehension  Verbalized understanding;Returned demonstration       PT Short Term Goals - 04/17/19 1434      PT SHORT TERM GOAL #1   Title  Patient to experience R ankle/foot pain no more than 2/10 in order to improve QOL and gait pattern    Time  2    Period  Weeks    Status  On-going    Target Date  04/27/19      PT SHORT TERM GOAL #2   Title  Patient to be compliant with HEP, to be updated PRN    Time  2    Period  Weeks    Status  On-going        PT Long Term Goals - 04/17/19 1434      PT LONG TERM GOAL #1   Title  Patient to show an improvement of at least 1 grade in order to assist in reducing pain and improving gait/upright tolerance    Time  4    Period  Weeks    Status  On-going      PT LONG TERM GOAL #2   Title  Patient to demonstrate at least a 10 degree reduction in  bilateral genu recurvatum to reduce strain and abnormal forces on knee and ankle/foot joints    Time  4    Period  Weeks    Status  On-going      PT LONG TERM GOAL #3   Title  Patient to be able to reach at least neutral (0 degrees) with R ankle dorsiflexion, and will improve R ankle eversion by 10 degrees in order to reduce pain with weight bearing and improve gait mechanics    Time  4    Period  Weeks    Status  On-going      PT LONG TERM GOAL #4   Title  Patient to tolerate regular exercise program involving swimming and riding an exercise bike without increase in pain in order to improve QOL and personal health    Time  4    Period  Weeks    Status  On-going            Plan - 04/25/19 0934    Clinical Impression Statement  Pt session limited due to late arrival. Pt continues to have difficulty with towel scrunches bilaterally, with increased difficulty on L compared to R. Performed for time for muscle endurance strengthening and pt reports fatigue after 1 min requiring rest break. Added seated rockerboard for ankle mobility, but pt unable to maintain flat L foot on board due to decreased ROM. Attempted standing calf raises this date, constant verbal cues for form to decrease trunk swaying but pt unable to improve. Increased difficulty with L calf raise compared to R, pt unable to fully lift either foot into desired range but denies pain. Pt with resting bil knee hyperextension 10 deg bilaterally in supine and pt reports has been present since she was young. Pt due for reassessment next visit of goals and other objective measures. Continue to progress as able.    Personal Factors and Comorbidities  Age;Fitness;Past/Current Experience;Comorbidity 3+;Education;Finances;Time since onset of injury/illness/exacerbation    Comorbidities  DM, MS, obesity, hypothyroidism, L total foot reconstruction    Examination-Activity Limitations  Locomotion Level;Transfers;Squat;Stairs;Stand  Examination-Participation Restrictions  Yard Work;Community Activity;Shop    Stability/Clinical Decision Making  Unstable/Unpredictable    Rehab Potential  Fair    PT Frequency  2x / week    PT Duration  4 weeks    PT Treatment/Interventions  ADLs/Self Care Home Management;Biofeedback;Cryotherapy;Electrical Stimulation;Iontophoresis 4mg /ml Dexamethasone;Ultrasound;DME Instruction;Gait training;Stair training;Functional mobility training;Therapeutic activities;Therapeutic exercise;Balance training;Neuromuscular re-education;Patient/family education;Orthotic Fit/Training;Manual techniques;Passive range of motion;Energy conservation;Taping    PT Next Visit Plan  Reassessment. Continue strengthening for ankle, ankle ROM, stretching throughout calf and plantar fascia. Begin standing strengthening and balance exercises. Work within pt tolerated range and watch fatigue.    PT Home Exercise Plan  Eval: ankle circles, ankle alphabet, gastroc stretches with bolster under knees for recurvatum; 8/10: arch doming; 8/18: seated heel raises and toe raises    Consulted and Agree with Plan of Care  Patient       Patient will benefit from skilled therapeutic intervention in order to improve the following deficits and impairments:  Abnormal gait, Decreased coordination, Difficulty walking, Obesity, Decreased activity tolerance, Pain, Decreased balance, Hypomobility, Improper body mechanics, Decreased mobility, Decreased strength, Increased edema, Postural dysfunction  Visit Diagnosis: 1. Pain in right ankle and joints of right foot   2. Other abnormalities of gait and mobility   3. Unsteadiness on feet   4. Muscle weakness (generalized)        Problem List Patient Active Problem List   Diagnosis Date Noted  . Class 2 severe obesity due to excess calories with serious comorbidity and body mass index (BMI) of 38.0 to 38.9 in adult ([place]) 04/12/2019  . Type 2 diabetes mellitus without complication ([place])  19/37/9024  . Palpitations 04/12/2019  . Mild intermittent asthma without complication 09/73/5329  . Seasonal allergies 04/12/2019  . Hypothyroidism 04/12/2019  . OSA treated with BiPAP 04/12/2019  . Gastroesophageal reflux disease 04/12/2019  . Essential hypertension 04/12/2019  . Intractable chronic migraine without aura 02/16/2019  . MS (multiple sclerosis) ([place]) 02/16/2019      Talbot Grumbling PT, DPT 04/25/19, 10:31 AM [place], [place], [address] Phone: [phone]   Fax:  [phone]  Name: Charlesia Canaday. [place] MRN: [id number] Date of Birth: 1971-06-08

## 2019-04-27 ENCOUNTER — Telehealth (HOSPITAL_COMMUNITY): Payer: Self-pay | Admitting: Physical Therapy

## 2019-04-27 ENCOUNTER — Ambulatory Visit (HOSPITAL_COMMUNITY): Payer: Self-pay | Admitting: Physical Therapy

## 2019-04-27 NOTE — Telephone Encounter (Signed)
No show. Called and spoke to patient, who reports that she forgot her appointment and apologizes. Confirms that she will be at her next scheduled session.  Deniece Ree PT, DPT, CBIS  Supplemental Physical Therapist [facility]    Pager [phone] Acute Rehab Office [phone]

## 2019-05-01 ENCOUNTER — Other Ambulatory Visit: Payer: Self-pay

## 2019-05-01 ENCOUNTER — Ambulatory Visit (HOSPITAL_COMMUNITY): Payer: Self-pay | Admitting: Physical Therapy

## 2019-05-01 ENCOUNTER — Encounter (HOSPITAL_COMMUNITY): Payer: Self-pay | Admitting: Physical Therapy

## 2019-05-01 DIAGNOSIS — M6281 Muscle weakness (generalized): Secondary | ICD-10-CM

## 2019-05-01 DIAGNOSIS — M25571 Pain in right ankle and joints of right foot: Secondary | ICD-10-CM

## 2019-05-01 DIAGNOSIS — R2689 Other abnormalities of gait and mobility: Secondary | ICD-10-CM

## 2019-05-01 DIAGNOSIS — R2681 Unsteadiness on feet: Secondary | ICD-10-CM

## 2019-05-01 NOTE — Therapy (Signed)
[place], [place], [address] Phone: [phone]   Fax:  [phone]  Physical Therapy Treatment  Patient Details  Name: Amy Lester MRN: [id number] Date of Birth: Aug 14, 1971 Referring Provider (PT): Arther Abbott    Encounter Date: 05/01/2019   PHYSICAL THERAPY DISCHARGE SUMMARY  Visits from Start of Care: 5  Current functional level related to goals / functional outcomes: Lack of progress- return to MD due to lack of improvement as expected, unusual symptoms, complex medical history    Remaining deficits: See below    Education / Equipment: See below  Plan: Patient agrees to discharge.  Patient goals were not met. Patient is being discharged due to lack of progress.  ?????       PT End of Session - 05/01/19 1156    Visit Number  5    Number of Visits  5    Date for PT Re-Evaluation  05/11/19    Authorization Type  100% [facility] [employer], CAFA expiring 05/28/19    Authorization Time Period  04/13/19 to 05/11/19    Authorization - Visit Number  5    Authorization - Number of Visits  10    PT Start Time  1115    PT Stop Time  1145    PT Time Calculation (min)  30 min    Activity Tolerance  Patient tolerated treatment well    Behavior During Therapy  [facility] for tasks assessed/performed       Past Medical History:  Diagnosis Date  . Asthma   . Blood transfusion without reported diagnosis   . Chronic migraine   . Common migraine with intractable migraine 02/16/2019  . Diabetes mellitus without complication ([place])   . Endometriosis   . Fatty liver   . GERD (gastroesophageal reflux disease)   . Hyperlipidemia   . Hypertension   . Hypothyroidism   . Incontinence   . Irregular heartbeat   . Multiple sclerosis ([place])   . Palpitations   . Sleep apnea     Past Surgical History:  Procedure Laterality Date  . ABDOMINAL HYSTERECTOMY    . CESAREAN SECTION     x1  . CHOLECYSTECTOMY    . ENDOMETRIAL  ABLATION    . FOOT SURGERY Left 06/2018   foot reconstruction  . REPLACEMENT TOTAL KNEE Left     There were no vitals filed for this visit.  Subjective Assessment - 05/01/19 1115    Subjective  My right ankle is about the same. I've not noticed any changes at all. I'm gonna go talk to my doctor who referred me to PT here who may do an MRI. I did trip on our deck, we are redoing it and I didn't see part of a wood lip, I tripped and had a walking stumble- I had some pain an hour later but then went back to how its been.    Pertinent History  MS, hx of L foot reconstruction 2019    Patient Stated Goals  be able to run (I have not run for the past year), take care of problems on the left    Currently in Pain?  Yes    Pain Score  6     Pain Location  Foot    Pain Orientation  Right    Pain Descriptors / Indicators  Tightness;Discomfort;Aching;Other (Comment)   loose and weak   Pain Type  Chronic pain    Pain Radiating Towards  none  Pain Onset  More than a month ago    Pain Frequency  Constant    Aggravating Factors   walking around too much, squatting, standing    Pain Relieving Factors  nothing, sometimes putting feet up at night, being off of it    Effect of Pain on Daily Activities  moderate         [facility] PT Assessment - 05/01/19 0001      Assessment   Medical Diagnosis  R foot/ankle pain     Referring Provider (PT)  Arther Abbott     Onset Date/Surgical Date  --   within the past year    Next MD Visit  Dr. Aline Brochure Friday     Prior Therapy  PT for other foot, for shoulder      Precautions   Precautions  Other (comment)    Precaution Comments  cannot run       Restrictions   Weight Bearing Restrictions  No      Balance Screen   Has the patient fallen in the past 6 months  Yes    How many times?  3    Has the patient had a decrease in activity level because of a fear of falling?   Yes    Is the patient reluctant to leave their home because of a fear of falling?    No      Home Film/video editor residence      Prior Function   Level of Independence  Independent    Vocation  Unemployed    Leisure  gardening, fitness classes, swiimming       AROM   Overall AROM Comments  genu recurvatum R -27 degrees, L -30 degrees     Right Ankle Dorsiflexion  -13    Right Ankle Plantar Flexion  65    Right Ankle Inversion  30    Right Ankle Eversion  9    Left Ankle Dorsiflexion  -9    Left Ankle Plantar Flexion  60    Left Ankle Inversion  25    Left Ankle Eversion  6      Strength   Right Hip Flexion  5/5    Right Hip Extension  3/5    Right Hip ABduction  4+/5    Left Hip Flexion  5/5    Left Hip Extension  3/5    Left Hip ABduction  5/5    Right Knee Flexion  4+/5    Right Knee Extension  4+/5    Left Knee Flexion  4+/5    Left Knee Extension  4+/5    Right Ankle Dorsiflexion  4/5    Right Ankle Inversion  4/5    Right Ankle Eversion  4-/5    Left Ankle Dorsiflexion  4+/5    Left Ankle Inversion  5/5    Left Ankle Eversion  5/5                           PT Education - 05/01/19 1156    Education Details  DC due to lack of progress, referral for swedish knee cages for MD to sign and process of obtaining via [employer]) Educated  Patient    Methods  Explanation;Handout    Comprehension  Verbalized understanding       PT Short Term Goals - 05/01/19 1136      PT SHORT TERM  GOAL #1   Title  Patient to experience R ankle/foot pain no more than 2/10 in order to improve QOL and gait pattern    Baseline  8/24- 6/10 walking in today, on average 5-6/10    Time  2    Period  Weeks    Status  Not Met      PT SHORT TERM GOAL #2   Title  Patient to be compliant with HEP, to be updated PRN    Time  2    Period  Weeks    Status  Achieved        PT Long Term Goals - 05/01/19 1137      PT LONG TERM GOAL #1   Title  Patient to show an improvement of at least 1 grade in order to assist in  reducing pain and improving gait/upright tolerance    Baseline  8/24- flowsheets    Time  4    Period  Weeks    Status  Not Met      PT LONG TERM GOAL #2   Title  Patient to demonstrate at least a 10 degree reduction in bilateral genu recurvatum to reduce strain and abnormal forces on knee and ankle/foot joints    Time  4    Period  Weeks    Status  Not Met      PT LONG TERM GOAL #3   Title  Patient to be able to reach at least neutral (0 degrees) with R ankle dorsiflexion, and will improve R ankle eversion by 10 degrees in order to reduce pain with weight bearing and improve gait mechanics    Time  4    Period  Weeks    Status  Not Met      PT LONG TERM GOAL #4   Title  Patient to tolerate regular exercise program involving swimming and riding an exercise bike without increase in pain in order to improve QOL and personal health    Baseline  8/24- bike deferred, can't swim due to gym hours    Time  4    Period  Weeks    Status  Not Met            Plan - 05/01/19 1157    Clinical Impression Statement  Re-assessment performed today as per POC. Objective assessment does not show significant change even in bilateral ankle ROM or strength; would typically expect to see at least some change in ROM at this point in therapy even early on. Patient reports no significant change, and continues to demonstrate functional impairments as noted at evaluation. Due to complex medical history, unusual symptoms, and lack of significant change (to expected degree given limited number of sessions), recommend DC today due to lack of progress and return to MD for further guidance in POC.    Personal Factors and Comorbidities  Age;Fitness;Past/Current Experience;Comorbidity 3+;Education;Finances;Time since onset of injury/illness/exacerbation    Comorbidities  DM, MS, obesity, hypothyroidism, L total foot reconstruction    Examination-Activity Limitations  Locomotion Level;Transfers;Squat;Stairs;Stand     Examination-Participation Restrictions  Yard Work;Community Activity;Shop    Stability/Clinical Decision Making  Unstable/Unpredictable    Clinical Decision Making  High    Rehab Potential  Fair    PT Treatment/Interventions  ADLs/Self Care Home Management;Biofeedback;Cryotherapy;Electrical Stimulation;Iontophoresis 34m/ml Dexamethasone;Ultrasound;DME Instruction;Gait training;Stair training;Functional mobility training;Therapeutic activities;Therapeutic exercise;Balance training;Neuromuscular re-education;Patient/family education;Orthotic Fit/Training;Manual techniques;Passive range of motion;Energy conservation;Taping    PT Next Visit Plan  DC- return to MD for further POC  PT Home Exercise Plan  Eval: ankle circles, ankle alphabet, gastroc stretches with bolster under knees for recurvatum; 8/10: arch doming; 8/18: seated heel raises and toe raises    Consulted and Agree with Plan of Care  Patient       Patient will benefit from skilled therapeutic intervention in order to improve the following deficits and impairments:  Abnormal gait, Decreased coordination, Difficulty walking, Obesity, Decreased activity tolerance, Pain, Decreased balance, Hypomobility, Improper body mechanics, Decreased mobility, Decreased strength, Increased edema, Postural dysfunction  Visit Diagnosis: Pain in right ankle and joints of right foot  Other abnormalities of gait and mobility  Unsteadiness on feet  Muscle weakness (generalized)     Problem List Patient Active Problem List   Diagnosis Date Noted  . Class 2 severe obesity due to excess calories with serious comorbidity and body mass index (BMI) of 38.0 to 38.9 in adult ([place]) 04/12/2019  . Type 2 diabetes mellitus without complication ([place]) 14/48/1856  . Palpitations 04/12/2019  . Mild intermittent asthma without complication 31/49/7026  . Seasonal allergies 04/12/2019  . Hypothyroidism 04/12/2019  . OSA treated with BiPAP 04/12/2019  .  Gastroesophageal reflux disease 04/12/2019  . Essential hypertension 04/12/2019  . Intractable chronic migraine without aura 02/16/2019  . MS (multiple sclerosis) ([place]) 02/16/2019    Deniece Ree PT, DPT, CBIS  Supplemental Physical Therapist [place]    Pager [phone] Acute Rehab Office [place] [address] [place], [place], [address] Phone: [phone]   Fax:  [phone]  Name: Amy Lester MRN: [id number] Date of Birth: 1970/09/26

## 2019-05-03 ENCOUNTER — Encounter (HOSPITAL_COMMUNITY): Payer: Medicaid Other | Admitting: Physical Therapy

## 2019-05-03 ENCOUNTER — Encounter

## 2019-05-03 ENCOUNTER — Ambulatory Visit: Payer: Medicaid Other | Admitting: Internal Medicine

## 2019-05-04 ENCOUNTER — Other Ambulatory Visit: Payer: Self-pay

## 2019-05-04 ENCOUNTER — Ambulatory Visit (INDEPENDENT_AMBULATORY_CARE_PROVIDER_SITE_OTHER): Payer: Self-pay | Admitting: Pulmonary Disease

## 2019-05-04 ENCOUNTER — Encounter: Payer: Self-pay | Admitting: Pulmonary Disease

## 2019-05-04 VITALS — BP 138/90 | HR 77 | Temp 98.1°F | Ht 64.0 in | Wt 224.4 lb

## 2019-05-04 DIAGNOSIS — E669 Obesity, unspecified: Secondary | ICD-10-CM

## 2019-05-04 DIAGNOSIS — J4530 Mild persistent asthma, uncomplicated: Secondary | ICD-10-CM

## 2019-05-04 DIAGNOSIS — Z789 Other specified health status: Secondary | ICD-10-CM

## 2019-05-04 DIAGNOSIS — G4730 Sleep apnea, unspecified: Secondary | ICD-10-CM

## 2019-05-04 DIAGNOSIS — G4733 Obstructive sleep apnea (adult) (pediatric): Secondary | ICD-10-CM

## 2019-05-04 MED ORDER — MONTELUKAST SODIUM 10 MG PO TABS: 10.0000 mg | ORAL_TABLET | Freq: Every day | ORAL | 5 refills | Status: DC

## 2019-05-04 NOTE — Patient Instructions (Signed)
Montelukast (singulair) 10 mg pill nightly for allergies and asthma  Will arrange for home sleep study  Will arrange for referral to ENT surgeon to assess surgical options for treatment of obstructive sleep apnea  Follow up in 6 months

## 2019-05-04 NOTE — Progress Notes (Signed)
[facility]  Chief Complaint  Patient presents with  . Consult    Currently using Cpap. She recently moved from [place]. She would like to discuss some alternatives for the cpap. She reports not having insurance and the parts are expensive.Reports being diagnosed with Asthma.     Constitutional:  BP 138/90   Pulse 77   Temp 98.1 F (36.7 C) (Temporal)   Ht 5\' 4"  (1.626 m)   Wt 224 lb 6.4 oz (101.8 kg)   SpO2 99%   BMI 38.52 kg/m   Past Medical History:  Multiple sclerosis, Hypothyroidism, HTN, HLD, GERD, Fatty liver, Endometriosis, DM, Migraine HA, Asthma  Brief Summary:  Amy Lester is a 48 y.o. female with obstructive sleep apnea.  She was living in [place].  Had sleep study there.  Results not available.  Has been using CPAP.  Doesn't like wearing CPAP.  Impedes her ability to travel and go camping.  She also is concerned about expense since she has been paying out of pocket for supplies.  She wants to be free of CPAP.  Goes to bed at 9 pm.  Puts on CPAP.  Falls asleep at 11 pm.  Wakes up at 8 am.  Feels rested.  Not using anything to help sleep or stay awake.  She was told she can't use oral appliance due to teeth grinding.  She has history of asthma.  Noticed more trouble with allergies since moving to [place].  Usually Fall is worst season for her.  Has only been using prn albuterol and zyrtec.  CXR 03/24/19 (reviewed by me) normal.  Labs from then reviewed and normal except elevated blood sugar.  Physical Exam:   Appearance - well kempt   ENMT - clear nasal mucosa, midline nasal  septum, no oral exudates, no LAN, trachea midline  Respiratory - normal chest wall, normal respiratory effort, no accessory muscle use, no wheeze/rales  CV - s1s2 regular rate and rhythm, no murmurs, no peripheral edema, radial pulses symmetric  GI - soft, non tender, no masses  Lymph - no adenopathy noted in neck and axillary  areas  MSK - normal gait  Ext - no cyanosis, clubbing, or joint inflammation noted  Skin - no rashes, lesions, or ulcers  Neuro - normal strength, oriented x 3  Psych - normal mood and affect   CBC Latest Ref Rng & Units 03/24/2019 11/25/2018  WBC 4.0 - 10.5 K/uL 7.1 7.9  Hemoglobin 12.0 - 15.0 g/dL 60.413.1 54.014.6  Hematocrit 98.136.0 - 46.0 % 40.7 45.2  Platelets 150 - 400 K/uL 315 380    Lab Results  Component Value Date   TSH 1.734 03/24/2019    CMP Latest Ref Rng & Units 03/24/2019 02/24/2019 11/25/2018  Glucose 70 - 99 mg/dL 191(Y141(H) 782(N149(H) 562(Z126(H)  BUN 6 - 20 mg/dL 15 18 17   Creatinine 0.44 - 1.00 mg/dL 3.080.78 6.570.82 8.460.82  Sodium 135 - 145 mmol/L 137 138 136  Potassium 3.5 - 5.1 mmol/L 3.9 3.7 3.9  Chloride 98 - 111 mmol/L 105 104 103  CO2 22 - 32 mmol/L 22 22 22   Calcium 8.9 - 10.3 mg/dL 8.9 9.3 9.4  Total Protein 6.5 - 8.1 g/dL 8.0 9.6(E8.3(H) 9.5(M8.7(H)  Total Bilirubin 0.3 - 1.2 mg/dL 0.4 0.5 0.5  Alkaline Phos 38 - 126 U/L 107 122 129(H)  AST 15 - 41 U/L 30 29 39  ALT 0 - 44 U/L 33 34 33    Discussion:  She has history of obstructive sleep apnea.  She seems well maintained on CPAP, but she does not want to continue CPAP.  She is not interested in trying oral appliance.  She would prefer to have surgical assessment for treatment of her sleep apnea.  I reviewed in detail the risks involved with surgical intervention, and that CPAP is still best option.  Being aware of this, she still would like surgical assessment.  Assessment/Plan:   Obstructive sleep apnea. - will arrange for home sleep study to assess current status - will arrange for referral to ENT to assess for surgical intervention - explained that Inspire device typically has BMI cut off, and she therefore might not be a suitable candidate  Allergic asthma. - she can use singulair during the Fall season - continue prn albuterol, zyrtec  Obesity. - reviewed options to assist with weight loss  Migraine headache, multiple  sclerosis. - followed by [facility] Anne HahnWillis [facility]   Patient Instructions  Montelukast (singulair) 10 mg pill nightly for allergies and asthma  Will arrange for home sleep study  Will arrange for referral to ENT surgeon to assess surgical options for treatment of obstructive sleep apnea  Follow up in 6 months    Coralyn HellingVineet Hakeem Frazzini, MD [facility] Pager: [phone] 05/04/2019, 11:26 AM  Flow Sheet    Sleep tests:  CPAP 02/03/19 to 05/03/19 >> used on 90 of 90 nights with average 10 hrs 31 min.  Average AHI 0.4 with median CPAP 16 and 95 th percentile CPAP 17 cm H2O  Review of Systems:  Denies headache, fever, eye pain, ear pain, gland swelling, thyroid swelling, chest pain, palpitations, abdominal pain, heartburn, diarrhea, skin rash, leg swelling, difficulty walking, muscle weakness.   Medications:   Allergies as of 05/04/2019      Reactions   Percocet [oxycodone-acetaminophen] Nausea And Vomiting      Medication List       Accurate as of May 04, 2019 11:26 AM. If you have any questions, ask your nurse or doctor.        STOP taking these medications   hydrochlorothiazide 25 MG tablet Commonly known as: HYDRODIURIL Stopped by: Coralyn HellingVineet Indigo Chaddock, MD   UNABLE TO FIND Stopped by: Coralyn HellingVineet Burnette Sautter, MD     TAKE these medications   albuterol 108 (90 Base) MCG/ACT inhaler Commonly known as: VENTOLIN HFA Inhale 3 puffs into the lungs every 6 (six) hours as needed for wheezing or shortness of breath.   atorvastatin 10 MG tablet Commonly known as: LIPITOR Take 1 tablet (10 mg total) by mouth daily.   cetirizine 10 MG tablet Commonly known as: ZYRTEC Take 1 tablet (10 mg total) by mouth daily.   clotrimazole-betamethasone cream Commonly known as: Lotrisone Apply 1 application topically as needed.   estradiol 1.53 MG/SPRAY transdermal spray Commonly known as: EVAMIST Place 3 sprays onto the skin daily.   glipiZIDE 5 MG tablet Commonly known as:  GLUCOTROL Take 1 tablet (5 mg total) by mouth daily before breakfast.   HYDROCODONE-ACETAMINOPHEN PO Take 1 tablet by mouth as needed.   levothyroxine 25 MCG tablet Commonly known as: SYNTHROID Take 1 tablet (25 mcg total) by mouth daily before breakfast.   levothyroxine 200 MCG tablet Commonly known as: Synthroid Take 1 tablet (200 mcg total) by mouth daily before breakfast.   liothyronine 5 MCG tablet Commonly known as: CYTOMEL Take 5 mcg by mouth daily.   liothyronine 5 MCG tablet Commonly known as: Cytomel Take 1 tablet (5 mcg  total) by mouth daily.   lisinopril 10 MG tablet Commonly known as: ZESTRIL Take 1 tablet (10 mg total) by mouth daily.   metoprolol tartrate 100 MG tablet Commonly known as: LOPRESSOR Take 1 tablet (100 mg total) by mouth 2 (two) times daily.   metroNIDAZOLE 1 % gel Commonly known as: METROGEL Apply 1 application topically daily.   montelukast 10 MG tablet Commonly known as: SINGULAIR Take 1 tablet (10 mg total) by mouth at bedtime. Started by: Chesley Mires, MD   multivitamin with minerals tablet Take 1 tablet by mouth daily.   nystatin cream Commonly known as: MYCOSTATIN Apply 1 application topically daily as needed for dry skin.   omeprazole 40 MG capsule Commonly known as: PRILOSEC Take 1 capsule (40 mg total) by mouth daily.   ondansetron 8 MG tablet Commonly known as: ZOFRAN Take 8 mg by mouth as needed for nausea or vomiting.   Pazeo 0.7 % Soln Generic drug: Olopatadine HCl Apply to eye every morning.   RED YEAST RICE PO Take 1 tablet by mouth daily.   rizatriptan 10 MG tablet Commonly known as: Maxalt Take 1 tablet (10 mg total) by mouth 3 (three) times daily as needed for migraine. May repeat in 2 hours if needed   topiramate 25 MG tablet Commonly known as: TOPAMAX Take one tablet at night for one week, then take 2 tablets at night for one week, then take 3 tablets at night.   TRETINOIN EX Apply topically daily.    triamcinolone cream 0.1 % Commonly known as: KENALOG Apply 1 application topically 2 (two) times daily as needed.   triamterene-hydrochlorothiazide 37.5-25 MG tablet Commonly known as: MAXZIDE-25 Take 1 tablet by mouth daily.   VITAMIN B-2 PO Take by mouth.       Past Surgical History:  She  has a past surgical history that includes Abdominal hysterectomy; Cholecystectomy; Foot surgery (Left, 06/2018); Cesarean section; Endometrial ablation; and Replacement total knee (Left).  Family History:  Her family history includes Cancer in her father and mother; Heart attack in her mother; Hyperlipidemia in her mother; Hyperthyroidism in her mother; Memory loss in her daughter; Other in her daughter and son; Stroke in her mother.  Social History:  She  reports that she has never smoked. She has never used smokeless tobacco. She reports current alcohol use. She reports that she does not use drugs.

## 2019-05-05 ENCOUNTER — Ambulatory Visit: Payer: Self-pay | Admitting: Orthopedic Surgery

## 2019-05-09 ENCOUNTER — Ambulatory Visit (INDEPENDENT_AMBULATORY_CARE_PROVIDER_SITE_OTHER): Payer: Self-pay | Admitting: Student

## 2019-05-09 ENCOUNTER — Encounter (HOSPITAL_COMMUNITY): Payer: Medicaid Other

## 2019-05-09 ENCOUNTER — Other Ambulatory Visit: Payer: Self-pay

## 2019-05-09 ENCOUNTER — Encounter: Payer: Self-pay | Admitting: Student

## 2019-05-09 VITALS — BP 130/80 | HR 81 | Temp 97.1°F | Ht 64.0 in | Wt 226.0 lb

## 2019-05-09 DIAGNOSIS — E785 Hyperlipidemia, unspecified: Secondary | ICD-10-CM

## 2019-05-09 DIAGNOSIS — I10 Essential (primary) hypertension: Secondary | ICD-10-CM

## 2019-05-09 DIAGNOSIS — G4733 Obstructive sleep apnea (adult) (pediatric): Secondary | ICD-10-CM

## 2019-05-09 DIAGNOSIS — R002 Palpitations: Secondary | ICD-10-CM

## 2019-05-09 MED ORDER — METOPROLOL SUCCINATE ER 100 MG PO TB24: 100.0000 mg | ORAL_TABLET | Freq: Every day | ORAL | 3 refills | Status: DC

## 2019-05-09 NOTE — Patient Instructions (Signed)
Medication Instructions: STOP Lopressor   START Toprol XL 100 mg daily  Labwork: None  Procedures/Testing: Your physician has requested that you have an echocardiogram. Echocardiography is a painless test that uses sound waves to create images of your heart. It provides your doctor with information about the size and shape of your heart and how well your heart's chambers and valves are working. This procedure takes approximately one hour. There are no restrictions for this procedure.    Follow-Up: 3 months with Dr.Branch in the [facility] office  Any Additional Special Instructions Will Be Listed Below (If Applicable).     If you need a refill on your cardiac medications before your next appointment, please call your pharmacy.

## 2019-05-09 NOTE — Progress Notes (Signed)
Cardiology Office Note    Date:  05/09/2019   ID:  Amy Lester, DOB 12/04/1970, MRN [id number]  PCP:  Deeann SaintBanks, Shannon R, MD  Cardiologist: Dina RichBranch, Jonathan, MD    Chief Complaint  Patient presents with  . Follow-up    recent cardiac event monitor    History of Present Illness:    Amy Lester is a 48 y.o. female with past medical history of palpitations, HTN, and HLD who presents to the office today for 6-week follow-up.  She was last examined by Dr. Wyline MoodBranch as a new patient referral on 03/28/2019 for evaluation of palpitations and reported having episodes lasting approximately 10 minutes. She denied any alcohol use and had been reducing her caffeine intake. A 2-week event monitor was recommended for further assessment. This showed NSR with no significant arrhythmias and was overall a normal study.     In talking with the patient today, she reports that she is still having frequent palpitations which typically last for a few seconds and spontaneously resolved. No associated chest discomfort or radiating pain. She does feel occasional lightheadedness when this occurs. She denies any specific dyspnea on exertion, orthopnea, or PND. She does experience intermittent lower extremity edema but says that her weight has overall been stable.   In reviewing her medications, she says that she sometimes forgets to take her evening dose of Lopressor and her symptoms typically occur mostly when she is getting ready to go to bed. Symptoms can resolve with turning from side to side. She does not consume alcohol and has significantly reduced her caffeine intake. Says that she feels like her symptoms were not as frequent while she had the monitor in place. Does have a family history of CAD in both parents and multiple maternal aunts and uncles.    Past Medical History:  Diagnosis Date  . Asthma   . Blood transfusion without reported diagnosis   . Chronic migraine   . Common migraine with  intractable migraine 02/16/2019  . Diabetes mellitus without complication (HCC)   . Endometriosis   . Fatty liver   . GERD (gastroesophageal reflux disease)   . Hyperlipidemia   . Hypertension   . Hypothyroidism   . Incontinence   . Irregular heartbeat   . Multiple sclerosis (HCC)   . Palpitations   . Sleep apnea     Past Surgical History:  Procedure Laterality Date  . ABDOMINAL HYSTERECTOMY    . CESAREAN SECTION     x1  . CHOLECYSTECTOMY    . ENDOMETRIAL ABLATION    . FOOT SURGERY Left 06/2018   foot reconstruction  . REPLACEMENT TOTAL KNEE Left     Current Medications: Outpatient Medications Prior to Visit  Medication Sig Dispense Refill  . albuterol (VENTOLIN HFA) 108 (90 Base) MCG/ACT inhaler Inhale 3 puffs into the lungs every 6 (six) hours as needed for wheezing or shortness of breath. 3 g 1  . atorvastatin (LIPITOR) 10 MG tablet Take 1 tablet (10 mg total) by mouth daily. 90 tablet 1  . cetirizine (ZYRTEC) 10 MG tablet Take 1 tablet (10 mg total) by mouth daily. 90 tablet 0  . clotrimazole-betamethasone (LOTRISONE) cream Apply 1 application topically as needed. 45 g 2  . estradiol (EVAMIST) 1.53 MG/SPRAY transdermal spray Place 3 sprays onto the skin daily.    Marland Kitchen. glipiZIDE (GLUCOTROL) 5 MG tablet Take 1 tablet (5 mg total) by mouth daily before breakfast. 90 tablet 1  . HYDROCODONE-ACETAMINOPHEN PO Take 1 tablet by  mouth as needed.    Marland Kitchen levothyroxine (SYNTHROID) 200 MCG tablet Take 1 tablet (200 mcg total) by mouth daily before breakfast. 90 tablet 0  . levothyroxine (SYNTHROID) 25 MCG tablet Take 1 tablet (25 mcg total) by mouth daily before breakfast. 90 tablet 0  . liothyronine (CYTOMEL) 5 MCG tablet Take 5 mcg by mouth daily.    Marland Kitchen liothyronine (CYTOMEL) 5 MCG tablet Take 1 tablet (5 mcg total) by mouth daily. 90 tablet 3  . lisinopril (ZESTRIL) 10 MG tablet Take 1 tablet (10 mg total) by mouth daily. 90 tablet 0  . metroNIDAZOLE (METROGEL) 1 % gel Apply 1  application topically daily.    . montelukast (SINGULAIR) 10 MG tablet Take 1 tablet (10 mg total) by mouth at bedtime. 30 tablet 5  . Multiple Vitamins-Minerals (MULTIVITAMIN WITH MINERALS) tablet Take 1 tablet by mouth daily.    Marland Kitchen nystatin cream (MYCOSTATIN) Apply 1 application topically daily as needed for dry skin.    Marland Kitchen Olopatadine HCl (PAZEO) 0.7 % SOLN Apply to eye every morning.    Marland Kitchen omeprazole (PRILOSEC) 40 MG capsule Take 1 capsule (40 mg total) by mouth daily. 90 capsule 0  . ondansetron (ZOFRAN) 8 MG tablet Take 8 mg by mouth as needed for nausea or vomiting.    . Red Yeast Rice Extract (RED YEAST RICE PO) Take 1 tablet by mouth daily.     . Riboflavin (VITAMIN B-2 PO) Take by mouth.    . rizatriptan (MAXALT) 10 MG tablet Take 1 tablet (10 mg total) by mouth 3 (three) times daily as needed for migraine. May repeat in 2 hours if needed 27 tablet 1  . topiramate (TOPAMAX) 25 MG tablet Take one tablet at night for one week, then take 2 tablets at night for one week, then take 3 tablets at night. 90 tablet 1  . TRETINOIN EX Apply topically daily.    Marland Kitchen triamcinolone cream (KENALOG) 0.1 % Apply 1 application topically 2 (two) times daily as needed. 45 g 0  . triamterene-hydrochlorothiazide (MAXZIDE-25) 37.5-25 MG tablet Take 1 tablet by mouth daily.    . metoprolol tartrate (LOPRESSOR) 100 MG tablet Take 1 tablet (100 mg total) by mouth 2 (two) times daily. 180 tablet 1   No facility-administered medications prior to visit.      Allergies:   Percocet [oxycodone-acetaminophen]   Social History   Socioeconomic History  . Marital status: Unknown    Spouse name: Not on file  . Number of children: Not on file  . Years of education: 29  . Highest education level: Not on file  Occupational History  . Not on file  Social Needs  . Financial resource strain: Not on file  . Food insecurity    Worry: Not on file    Inability: Not on file  . Transportation needs    Medical: Not on file     Non-medical: Not on file  Tobacco Use  . Smoking status: Never Smoker  . Smokeless tobacco: Never Used  Substance and Sexual Activity  . Alcohol use: Yes    Frequency: Never    Comment: once a year  . Drug use: Never  . Sexual activity: Not on file  Lifestyle  . Physical activity    Days per week: Not on file    Minutes per session: Not on file  . Stress: Not on file  Relationships  . Social Herbalist on phone: Not on file    Gets  together: Not on file    Attends religious service: Not on file    Active member of club or organization: Not on file    Attends meetings of clubs or organizations: Not on file    Relationship status: Not on file  Other Topics Concern  . Not on file  Social History Narrative   Left handed    Caffeine  1 soda per day    Lives at home with husband and adult daughter     Family History:  The patient's family history includes Cancer in her father and mother; Heart attack in her mother; Hyperlipidemia in her mother; Hyperthyroidism in her mother; Memory loss in her daughter; Other in her daughter and son; Stroke in her mother.   Review of Systems:   Please see the history of present illness.     General:  No chills, fever, night sweats or weight changes.  Cardiovascular:  No chest pain, dyspnea on exertion, edema, orthopnea, paroxysmal nocturnal dyspnea. Positive for palpitations.  Dermatological: No rash, lesions/masses Respiratory: No cough, dyspnea Urologic: No hematuria, dysuria Abdominal:   No nausea, vomiting, diarrhea, bright red blood per rectum, melena, or hematemesis Neurologic:  No visual changes, wkns, changes in mental status. All other systems reviewed and are otherwise negative except as noted above.   Physical Exam:    VS:  BP 130/80 (BP Location: Right Arm)   Pulse 81   Temp (!) 97.1 F (36.2 C)   Ht 5\' 4"  (1.626 m)   Wt 226 lb (102.5 kg)   SpO2 98%   BMI 38.79 kg/m    General: Well developed, well  nourished,female appearing in no acute distress. Head: Normocephalic, atraumatic, sclera non-icteric, no xanthomas, nares are without discharge.  Neck: No carotid bruits. JVD not elevated.  Lungs: Respirations regular and unlabored, without wheezes or rales.  Heart: Regular rate and rhythm. No S3 or S4.  No murmur, no rubs, or gallops appreciated. Abdomen: Soft, non-tender, non-distended with normoactive bowel sounds. No hepatomegaly. No rebound/guarding. No obvious abdominal masses. Msk:  Strength and tone appear normal for age. No joint deformities or effusions. Extremities: No clubbing or cyanosis. No lower extremity edema.  Distal pedal pulses are 2+ bilaterally. Neuro: Alert and oriented X 3. Moves all extremities spontaneously. No focal deficits noted. Psych:  Responds to questions appropriately with a normal affect. Skin: No rashes or lesions noted  Wt Readings from Last 3 Encounters:  05/09/19 226 lb (102.5 kg)  05/04/19 224 lb 6.4 oz (101.8 kg)  04/12/19 225 lb (102.1 kg)     Studies/Labs Reviewed:   EKG:  EKG is not ordered today.    Recent Labs: 03/24/2019: ALT 33; BUN 15; Creatinine, Ser 0.78; Hemoglobin 13.1; Platelets 315; Potassium 3.9; Sodium 137; TSH 1.734   Lipid Panel    Component Value Date/Time   CHOL 246 (H) 02/24/2019 1455   TRIG 188 (H) 02/24/2019 1455   HDL 45 02/24/2019 1455   CHOLHDL 5.5 02/24/2019 1455   VLDL 38 02/24/2019 1455   LDLCALC 163 (H) 02/24/2019 1455    Additional studies/ records that were reviewed today include:   Event Monitor: 04/04/2019  14 day event monitor  Min HR 55, Max HR 161, Avg HR 80  Reported symptoms correlate with sinus rhythm  No significant arrhythmias    Assessment:    1. Palpitations   2. Essential hypertension   3. Hyperlipidemia, unspecified hyperlipidemia type   4. OSA (obstructive sleep apnea)      Plan:  In order of problems listed above:  1. Palpitations - recent event monitor was  reassuring and showed no significant arrhythmias as outlined above. She describes symptoms lasting for a few seconds then spontaneously resolving. Suspect this is possibly due to PAC's or PVC's. She has been reducing her caffeine intake without much improvement and is anxious about her symptoms given her family cardiac history. Will obtain an echocardiogram to assess for any structural abnormalities. Electrolytes WNL with recent labs and TSH was previously abnormal due to her missing a week of Synthroid but was back to 1.734 when checked in 03/2019. - she is listed as taking Lopressor 100mg  BID but misses her evening dose at times. Will switch to Toprol-XL 100mg  daily for sustained release. Can titrate to 100mg  BID if symptoms do not improve and HR and BP allow.   2. HTN - BP is well-controlled at 130/80 during today's visit. Continue current regimen with Lisinopril and Triamterene-HCTZ with adjustment from Lopressor to Toprol-XL as outlined above.   3. HLD - followed by PCP. Remains on Atorvastatin 10mg  daily.   4. OSA - continued compliance with CPAP encouraged. Scheduled for a repeat Home Sleep Study later this month per Pulmonology.   Medication Adjustments/Labs and Tests Ordered: Current medicines are reviewed at length with the patient today.  Concerns regarding medicines are outlined above.  Medication changes, Labs and Tests ordered today are listed in the Patient Instructions below. Patient Instructions  Medication Instructions: STOP Lopressor   START Toprol XL 100 mg daily  Labwork: None  Procedures/Testing: Your physician has requested that you have an echocardiogram. Echocardiography is a painless test that uses sound waves to create images of your heart. It provides your doctor with information about the size and shape of your heart and how well your heart's chambers and valves are working. This procedure takes approximately one hour. There are no restrictions for this  procedure.   Follow-Up: 3 [facility]  Any Additional Special Instructions Will Be Listed Below (If Applicable).  If you need a refill on your cardiac medications before your next appointment, please call your pharmacy.    Signed, Ellsworth LennoxBrittany M Irfan Veal, PA-C  05/09/2019 8:14 PM    [facility] [address] [place], [place] [address] Phone: [phone] Fax: [phone]

## 2019-05-10 ENCOUNTER — Ambulatory Visit
Admission: EM | Admit: 2019-05-10 | Discharge: 2019-05-10 | Disposition: A | Discharge status: Home / Self Care | Payer: Medicaid Other | Attending: Emergency Medicine | Admitting: Emergency Medicine

## 2019-05-10 ENCOUNTER — Other Ambulatory Visit: Payer: Self-pay

## 2019-05-10 ENCOUNTER — Ambulatory Visit (INDEPENDENT_AMBULATORY_CARE_PROVIDER_SITE_OTHER): Payer: Self-pay | Admitting: Orthopedic Surgery

## 2019-05-10 VITALS — Ht 64.0 in | Wt 222.0 lb

## 2019-05-10 DIAGNOSIS — M25571 Pain in right ankle and joints of right foot: Secondary | ICD-10-CM

## 2019-05-10 DIAGNOSIS — M76821 Posterior tibial tendinitis, right leg: Secondary | ICD-10-CM

## 2019-05-10 DIAGNOSIS — H6502 Acute serous otitis media, left ear: Secondary | ICD-10-CM

## 2019-05-10 DIAGNOSIS — H60391 Other infective otitis externa, right ear: Secondary | ICD-10-CM

## 2019-05-10 DIAGNOSIS — H109 Unspecified conjunctivitis: Secondary | ICD-10-CM

## 2019-05-10 DIAGNOSIS — H01002 Unspecified blepharitis right lower eyelid: Secondary | ICD-10-CM

## 2019-05-10 MED ORDER — POLYMYXIN B-TRIMETHOPRIM 10000-0.1 UNIT/ML-% OP SOLN: 1.0000 [drp] | OPHTHALMIC | 0 refills | Status: AC

## 2019-05-10 MED ORDER — AMOXICILLIN-POT CLAVULANATE 875-125 MG PO TABS: 1.0000 | ORAL_TABLET | Freq: Two times a day (BID) | ORAL | 0 refills | Status: AC

## 2019-05-10 NOTE — Discharge Instructions (Signed)
Bacterial conjunctivitis/ blepharitis:  Use eye drops as prescribed and to completion Wash pillow cases, wash hands regularly with soap and water, avoid touching your face and eyes, wash door handles, light switches, remotes and other objects you frequently touch  Ear infection: Rest and drink plenty of fluids Prescribed augmentin  Continue to use OTC ibuprofen and/ or tylenol as needed for pain control  Follow up with PCP next week for recheck and to ensure your symptoms are improving Return or go to the ED if you have any new or worsening symptoms such as fever, chill, nausea, vomiting, vision changes, increased redness/ swelling, symptoms do not improve with medications, etc..

## 2019-05-10 NOTE — ED Triage Notes (Signed)
Pt has red painful eyes with draining, also c/o ear pain

## 2019-05-10 NOTE — ED Provider Notes (Signed)
[place]   [id number] 05/10/19 Arrival Time: 9983  CC: Eye irritation and ear pain.    SUBJECTIVE:  Amy Lester is a 48 y.o. female who presents with complaint of bilateral eye irritation, and discharge that began abruptly few days ago.  Describes discharge as yellow/olive in color.  Initially more prevalent in LT eye then spread to RT eye.  Also mentions left ear discomfort and drainage that occurred around a few days ago as well.  Denies a precipitating event, trauma, or close contacts with similar symptoms.  Has tried OTC sudafed without relief.  Symptoms are made worse to the touch.  Denies fever, chills, nausea, vomiting, eye pain, painful eye movements, itching, vision changes, double vision, FB sensation, changes in hearing.     Denies contact lens use.  Wears corrective lenses.    ROS: As per HPI.  All other pertinent ROS negative.     Past Medical History:  Diagnosis Date  . Asthma   . Blood transfusion without reported diagnosis   . Chronic migraine   . Common migraine with intractable migraine 02/16/2019  . Diabetes mellitus without complication ([place])   . Endometriosis   . Fatty liver   . GERD (gastroesophageal reflux disease)   . Hyperlipidemia   . Hypertension   . Hypothyroidism   . Incontinence   . Irregular heartbeat   . Multiple sclerosis ([place])   . Palpitations   . Sleep apnea    Past Surgical History:  Procedure Laterality Date  . ABDOMINAL HYSTERECTOMY    . CESAREAN SECTION     x1  . CHOLECYSTECTOMY    . ENDOMETRIAL ABLATION    . FOOT SURGERY Left 06/2018   foot reconstruction  . REPLACEMENT TOTAL KNEE Left    Allergies  Allergen Reactions  . Percocet [Oxycodone-Acetaminophen] Nausea And Vomiting   No current facility-administered medications on file prior to encounter.    Current Outpatient Medications on File Prior to Encounter  Medication Sig Dispense Refill  . albuterol (VENTOLIN HFA) 108 (90 Base) MCG/ACT inhaler Inhale 3  puffs into the lungs every 6 (six) hours as needed for wheezing or shortness of breath. 3 g 1  . atorvastatin (LIPITOR) 10 MG tablet Take 1 tablet (10 mg total) by mouth daily. 90 tablet 1  . cetirizine (ZYRTEC) 10 MG tablet Take 1 tablet (10 mg total) by mouth daily. 90 tablet 0  . clotrimazole-betamethasone (LOTRISONE) cream Apply 1 application topically as needed. 45 g 2  . estradiol (EVAMIST) 1.53 MG/SPRAY transdermal spray Place 3 sprays onto the skin daily.    Marland Kitchen glipiZIDE (GLUCOTROL) 5 MG tablet Take 1 tablet (5 mg total) by mouth daily before breakfast. 90 tablet 1  . HYDROCODONE-ACETAMINOPHEN PO Take 1 tablet by mouth as needed.    Marland Kitchen levothyroxine (SYNTHROID) 200 MCG tablet Take 1 tablet (200 mcg total) by mouth daily before breakfast. 90 tablet 0  . levothyroxine (SYNTHROID) 25 MCG tablet Take 1 tablet (25 mcg total) by mouth daily before breakfast. 90 tablet 0  . liothyronine (CYTOMEL) 5 MCG tablet Take 5 mcg by mouth daily.    Marland Kitchen lisinopril (ZESTRIL) 10 MG tablet Take 1 tablet (10 mg total) by mouth daily. 90 tablet 0  . metoprolol succinate (TOPROL-XL) 100 MG 24 hr tablet Take 1 tablet (100 mg total) by mouth daily. Take with or immediately following a meal. 90 tablet 3  . metroNIDAZOLE (METROGEL) 1 % gel Apply 1 application topically daily.    . montelukast (SINGULAIR)  10 MG tablet Take 1 tablet (10 mg total) by mouth at bedtime. 30 tablet 5  . Multiple Vitamins-Minerals (MULTIVITAMIN WITH MINERALS) tablet Take 1 tablet by mouth daily.    Marland Kitchen. nystatin cream (MYCOSTATIN) Apply 1 application topically daily as needed for dry skin.    Marland Kitchen. Olopatadine HCl (PAZEO) 0.7 % SOLN Apply to eye every morning.    Marland Kitchen. omeprazole (PRILOSEC) 40 MG capsule Take 1 capsule (40 mg total) by mouth daily. 90 capsule 0  . ondansetron (ZOFRAN) 8 MG tablet Take 8 mg by mouth as needed for nausea or vomiting.    . Red Yeast Rice Extract (RED YEAST RICE PO) Take 1 tablet by mouth daily.     . Riboflavin (VITAMIN B-2  PO) Take by mouth.    . rizatriptan (MAXALT) 10 MG tablet Take 1 tablet (10 mg total) by mouth 3 (three) times daily as needed for migraine. May repeat in 2 hours if needed 27 tablet 1  . topiramate (TOPAMAX) 25 MG tablet Take one tablet at night for one week, then take 2 tablets at night for one week, then take 3 tablets at night. 90 tablet 1  . TRETINOIN EX Apply topically daily.    Marland Kitchen. triamcinolone cream (KENALOG) 0.1 % Apply 1 application topically 2 (two) times daily as needed. 45 g 0  . triamterene-hydrochlorothiazide (MAXZIDE-25) 37.5-25 MG tablet Take 1 tablet by mouth daily.     Social History   Socioeconomic History  . Marital status: Unknown    Spouse name: Not on file  . Number of children: Not on file  . Years of education: 8214  . Highest education level: Not on file  Occupational History  . Not on file  Social Needs  . Financial resource strain: Not on file  . Food insecurity    Worry: Not on file    Inability: Not on file  . Transportation needs    Medical: Not on file    Non-medical: Not on file  Tobacco Use  . Smoking status: Never Smoker  . Smokeless tobacco: Never Used  Substance and Sexual Activity  . Alcohol use: Yes    Frequency: Never    Comment: once a year  . Drug use: Never  . Sexual activity: Not on file  Lifestyle  . Physical activity    Days per week: Not on file    Minutes per session: Not on file  . Stress: Not on file  Relationships  . Social Musicianconnections    Talks on phone: Not on file    Gets together: Not on file    Attends religious service: Not on file    Active member of club or organization: Not on file    Attends meetings of clubs or organizations: Not on file    Relationship status: Not on file  . Intimate partner violence    Fear of current or ex partner: Not on file    Emotionally abused: Not on file    Physically abused: Not on file    Forced sexual activity: Not on file  Other Topics Concern  . Not on file  Social History  Narrative   Left handed    Caffeine  1 soda per day    Lives at home with husband and adult daughter   Family History  Problem Relation Age of Onset  . Heart attack Mother   . Stroke Mother   . Cancer Mother        breast cancer  .  Hyperthyroidism Mother   . Hyperlipidemia Mother   . Cancer Father        lymph nodes, liver cancer  . Other Daughter        Fine Gold Type II Syndrome and cognetive issues  . Memory loss Daughter   . Other Son        Fine Gold Syndrome and BPES    OBJECTIVE:   Vitals:   05/10/19 1105  BP: 132/82  Pulse: 80  Resp: 16  Temp: 98.5 F (36.9 C)  TempSrc: Oral  SpO2: 100%    General appearance: alert; no distress Eyes: mild bilateral conjunctival erythema, purulent discharge present on eyelashes. PERRL; EOMI without discomfort; mild erythema over lower eyelids bilaterally, mildly TTP Ears: LT EAC with white exudate diffuse about the canal, TM without erythema; RT EAC clear, RT TM erythematous and tense; LT tragal tenderness; no erythema or tenderness of mastoids Nose: Nares patent without rhinorrhea Oropharynx: Clear Neck: supple Lungs: clear to auscultation bilaterally Heart: regular rate and rhythm Skin: warm and dry Psychological: alert and cooperative; normal mood and affect   ASSESSMENT & PLAN:  1. Bacterial conjunctivitis of both eyes   2. Blepharitis of lower eyelids of both eyes, unspecified type   3. Infective otitis externa of right ear   4. Non-recurrent acute serous otitis media of left ear     Meds ordered this encounter  Medications  . trimethoprim-polymyxin b (POLYTRIM) ophthalmic solution    Sig: Place 1 drop into both eyes every 4 (four) hours for 10 days.    Dispense:  10 mL    Refill:  0    Order Specific Question:   Supervising Provider    Answer:   Eustace MooreNELSON, YVONNE SUE [[id number]]  . amoxicillin-clavulanate (AUGMENTIN) 875-125 MG tablet    Sig: Take 1 tablet by mouth every 12 (twelve) hours for 10 days.     Dispense:  20 tablet    Refill:  0    Order Specific Question:   Supervising Provider    Answer:   Eustace MooreELSON, YVONNE SUE [[id number]]   Bacterial conjunctivitis/ blepharitis:  Use eye drops as prescribed and to completion Wash pillow cases, wash hands regularly with soap and water, avoid touching your face and eyes, wash door handles, light switches, remotes and other objects you frequently touch  Ear infection: Rest and drink plenty of fluids Prescribed augmentin  Continue to use OTC ibuprofen and/ or tylenol as needed for pain control  Follow up with PCP next week for recheck and to ensure your symptoms are improving Return or go to the ED if you have any new or worsening symptoms such as fever, chill, nausea, vomiting, vision changes, increased redness/ swelling, symptoms do not improve with medications, etc..   Reviewed expectations re: course of current medical issues. Questions answered. Outlined signs and symptoms indicating need for more acute intervention. Patient verbalized understanding. After Visit Summary given.   Rennis HardingWurst, Fusako Tanabe, PA-C 05/10/19 1208

## 2019-05-10 NOTE — Progress Notes (Signed)
Chief Complaint  Patient presents with  . Ankle Pain    recheck on right ankle.    Encounter Diagnoses  Name Primary?  . Pain in right ankle and joints of right foot Yes  . Posterior tibial tendon dysfunction (PTTD) of right lower extremity     10 minutes to discuss situation after therapy patient unhappy with therapist's assessment of her situation.  Recommend MRI of the ankle  Reschedule therapy at [facility]

## 2019-05-10 NOTE — Patient Instructions (Signed)
Therapy will be arranged with [facility] facility

## 2019-05-11 ENCOUNTER — Ambulatory Visit: Payer: Self-pay | Attending: Orthopedic Surgery | Admitting: Physical Therapy

## 2019-05-11 ENCOUNTER — Ambulatory Visit (HOSPITAL_COMMUNITY)
Admission: RE | Admit: 2019-05-11 | Discharge: 2019-05-11 | Disposition: A | Discharge status: Home / Self Care | Payer: Self-pay | Source: Ambulatory Visit | Attending: Student | Admitting: Student

## 2019-05-11 ENCOUNTER — Encounter (HOSPITAL_COMMUNITY): Payer: Medicaid Other

## 2019-05-11 ENCOUNTER — Other Ambulatory Visit: Payer: Self-pay

## 2019-05-11 DIAGNOSIS — M25571 Pain in right ankle and joints of right foot: Secondary | ICD-10-CM

## 2019-05-11 DIAGNOSIS — R2681 Unsteadiness on feet: Secondary | ICD-10-CM | POA: Insufficient documentation

## 2019-05-11 DIAGNOSIS — M25671 Stiffness of right ankle, not elsewhere classified: Secondary | ICD-10-CM

## 2019-05-11 DIAGNOSIS — R002 Palpitations: Secondary | ICD-10-CM | POA: Insufficient documentation

## 2019-05-11 DIAGNOSIS — M6281 Muscle weakness (generalized): Secondary | ICD-10-CM | POA: Insufficient documentation

## 2019-05-11 DIAGNOSIS — R2689 Other abnormalities of gait and mobility: Secondary | ICD-10-CM | POA: Insufficient documentation

## 2019-05-11 NOTE — Therapy (Signed)
[facility] [place], [place], [address] Phone: [phone]   Fax:  [phone]  Physical Therapy Evaluation  Patient Details  Name: Amy Lester MRN: [id number] Date of Birth: [birth year] Referring Provider (PT): Arther Abbott MD   Encounter Date: 05/11/2019  PT End of Session - 05/11/19 1356    Visit Number  1    Number of Visits  12    Date for PT Re-Evaluation  06/22/19    PT Start Time  0932    PT Stop Time  1212    PT Time Calculation (min)  47 min       Past Medical History:  Diagnosis Date  . Asthma   . Blood transfusion without reported diagnosis   . Chronic migraine   . Common migraine with intractable migraine 02/16/2019  . Diabetes mellitus without complication ([place])   . Endometriosis   . Fatty liver   . GERD (gastroesophageal reflux disease)   . Hyperlipidemia   . Hypertension   . Hypothyroidism   . Incontinence   . Irregular heartbeat   . Multiple sclerosis ([place])   . Palpitations   . Sleep apnea     Past Surgical History:  Procedure Laterality Date  . ABDOMINAL HYSTERECTOMY    . CESAREAN SECTION     x1  . CHOLECYSTECTOMY    . ENDOMETRIAL ABLATION    . FOOT SURGERY Left 06/2018   foot reconstruction  . REPLACEMENT TOTAL KNEE Left     There were no vitals filed for this visit.   Subjective Assessment - 05/11/19 1408    Subjective  COVID-19 screen performed prior to patient entering clinic.  The patient presents to the clinic today with c/o chronic right ankle pain.  She rates her pain at rest today at 3/10 but her pain commonly rises to 7+/10 with increased walking.  She has occasions of severe pain.  She had left ankle reconstruction and now states her right ankle feels like her left did.    Pertinent History  hx of L foot reconstruction 2019, hypothyroidism, DM, left TKA.    How long can you walk comfortably?  A community distance but with progessively worsenening pain.    Patient  Stated Goals  Exercise and run again.    Currently in Pain?  Yes    Pain Score  3     Pain Location  Ankle    Pain Orientation  Right    Pain Descriptors / Indicators  Aching    Pain Type  Chronic pain    Pain Onset  More than a month ago    Pain Frequency  Constant    Aggravating Factors   Increase walking.    Pain Relieving Factors  Rest.         [facility] PT Assessment - 05/11/19 0001      Assessment   Medical Diagnosis  Pain in right ankle.    Referring Provider (PT)  Arther Abbott MD    Onset Date/Surgical Date  --   Several months.     Restrictions   Weight Bearing Restrictions  No      Balance Screen   Has the patient fallen in the past 6 months  Yes    How many times?  --   3.   Has the patient had a decrease in activity level because of a fear of falling?   Yes    Is the patient reluctant to leave their home because of  a fear of falling?   No      Home Film/video editor residence      Prior Function   Level of Independence  Independent      ROM / Strength   AROM / PROM / Strength  AROM;Strength      AROM   Overall AROM Comments  Right ankle dorsiflexion to neutral with knee extended and 5 degrees with knee flexed.  7 degrees of eversion and 35 degrees of inversion.      Strength   Overall Strength Comments  Solid 4/5 into inversion, eversion and dorsiflexion.      Palpation   Palpation comment  Very tender to palpation over right ankle PTFL, calcaneofibular ligament and to a lesser degrees over her right ATFL and distal Achilles tenson.      Ambulation/Gait   Gait Comments  Antalgic gait pattern with decrease stance time over her right LE.                Objective measurements completed on examination: See above findings.      [facility] Adult PT Treatment/Exercise - 05/11/19 0001      Modalities   Modalities  Electrical Stimulation;Vasopneumatic      Electrical Stimulation   Electrical Stimulation Location  Right  lateral ankle.    Electrical Stimulation Action  Pre-mod.    Electrical Stimulation Parameters  80-150 Hz x 20 minutes.    Electrical Stimulation Goals  Pain      Vasopneumatic   Number Minutes Vasopneumatic   20 minutes    Vasopnuematic Location   --   Right ankle.   Vasopneumatic Pressure  Medium               PT Short Term Goals - 05/01/19 1136      PT SHORT TERM GOAL #1   Title  Patient to experience R ankle/foot pain no more than 2/10 in order to improve QOL and gait pattern    Baseline  8/24- 6/10 walking in today, on average 5-6/10    Time  2    Period  Weeks    Status  Not Met      PT SHORT TERM GOAL #2   Title  Patient to be compliant with HEP, to be updated PRN    Time  2    Period  Weeks    Status  Achieved        PT Long Term Goals - 05/11/19 1424      PT LONG TERM GOAL #1   Title  Independent with an HEP.    Time  6    Period  Weeks    Status  New      PT LONG TERM GOAL #2   Title  Increase right ankle dorsiflexion to 6- 8 degrees to normalize the patient's gait pattern.    Time  6    Period  Weeks    Status  New      PT LONG TERM GOAL #3   Title  Increase right ankle strength to 5/5 to increase stability for functional tasks.    Time  6    Period  Weeks    Status  New      PT LONG TERM GOAL #4   Title  Walk a community distance with pain not > 3/10.             Plan - 05/11/19 1418    Clinical Impression Statement  The patient presents to OPPT with c/o chronic right ankle pain.  She is very palpably tender over her right lateral ankle ligaments and distal Achilles tendon.  She is restricted in right ankle dorsiflexion.  She has an antalgic gait pattern with decreased stance time over her right LE.  Patient will benefit from skilled physical therapy intervention to address deficits and pain.    Personal Factors and Comorbidities  Comorbidity 1;Comorbidity 2;Comorbidity 3+    Comorbidities  hx of L foot reconstruction 2019,  hypothyroidism, DM, left TKA.    Examination-Activity Limitations  Locomotion Level;Other;Squat;Stairs    Examination-Participation Restrictions  Other;Yard Work    Public affairs consultant  Low    Rehab Potential  Good    PT Frequency  2x / week    PT Duration  6 weeks    PT Treatment/Interventions  ADLs/Self Care Home Management;Cryotherapy;Electrical Stimulation;Gait training;Ultrasound;Moist Heat;Therapeutic activities;Therapeutic exercise;Neuromuscular re-education;Patient/family education;Manual techniques;Passive range of motion;Vasopneumatic Device;Joint Manipulations    PT Next Visit Plan  Pulsed combo e'stim/U/S ro right lateral ankle and distal Achilles, STW/M and IASTM, Ionto, electrical stimulation and vasopneumatic.  Rockerboard, Gastroc-Soleus stretching.  Right ankle strengthening and proprioceptive activities.    Consulted and Agree with Plan of Care  Patient       Patient will benefit from skilled therapeutic intervention in order to improve the following deficits and impairments:  Abnormal gait, Decreased activity tolerance, Decreased strength, Decreased range of motion, Difficulty walking, Pain  Visit Diagnosis: Pain in right ankle and joints of right foot - Plan: PT plan of care cert/re-cert  Stiffness of right ankle, not elsewhere classified - Plan: PT plan of care cert/re-cert     Problem List Patient Active Problem List   Diagnosis Date Noted  . Class 2 severe obesity due to excess calories with serious comorbidity and body mass index (BMI) of 38.0 to 38.9 in adult ([place]) 04/12/2019  . Type 2 diabetes mellitus without complication ([place]) 14/48/1856  . Palpitations 04/12/2019  . Mild intermittent asthma without complication 31/49/7026  . Seasonal allergies 04/12/2019  . Hypothyroidism 04/12/2019  . OSA treated with BiPAP 04/12/2019  . Gastroesophageal reflux disease 04/12/2019  . Essential  hypertension 04/12/2019  . Intractable chronic migraine without aura 02/16/2019  . MS (multiple sclerosis) ([place]) 02/16/2019    , [place] MPT 05/11/2019, 2:27 PM  [facility] [address] [place], [place], [address] Phone: [phone]   Fax:  [phone]  Name: Amy Lester MRN: [id number] Date of Birth: [birth year]

## 2019-05-11 NOTE — Progress Notes (Signed)
  Echocardiogram 2D Echocardiogram has been performed.  Amy Lester 05/11/2019, 2:31 PM

## 2019-05-12 ENCOUNTER — Telehealth: Payer: Self-pay | Admitting: *Deleted

## 2019-05-12 NOTE — Telephone Encounter (Signed)
-----   Message from Erma Heritage, [place] sent at 05/12/2019  7:22 AM EDT ----- Please let the patient know her echocardiogram showed normal pumping function of the heart with no wall motion abnormalities. No significant valve abnormalities. Overall, a reassuring study. Please forward a copy of results to Billie Ruddy, MD.

## 2019-05-12 NOTE — Telephone Encounter (Signed)
Called patient with test results. No answer. Unable to leave msg.  

## 2019-05-16 ENCOUNTER — Ambulatory Visit: Payer: Self-pay | Admitting: Physical Therapy

## 2019-05-16 ENCOUNTER — Other Ambulatory Visit: Payer: Self-pay

## 2019-05-16 ENCOUNTER — Encounter: Payer: Self-pay | Admitting: Physical Therapy

## 2019-05-16 ENCOUNTER — Encounter (HOSPITAL_COMMUNITY): Payer: Medicaid Other

## 2019-05-16 DIAGNOSIS — M25571 Pain in right ankle and joints of right foot: Secondary | ICD-10-CM

## 2019-05-16 NOTE — Therapy (Signed)
[facility] [place], [place], [address] Phone: [phone]   Fax:  [phone]  Physical Therapy Treatment  Patient Details  Name: Amy Lester. [place] MRN: [id number] Date of Birth: [birth year] Referring Provider (PT): Arther Abbott MD   Encounter Date: 05/16/2019  PT End of Session - 05/16/19 1340    Visit Number  2    Number of Visits  12    Date for PT Re-Evaluation  06/22/19    Authorization Type  100% [facility], CAFA expiring 05/28/19    Authorization Time Period  04/13/19 to 05/11/19    PT Start Time  0100    PT Stop Time  0149    PT Time Calculation (min)  49 min    Activity Tolerance  Patient tolerated treatment well    Behavior During Therapy  [facility] for tasks assessed/performed       Past Medical History:  Diagnosis Date  . Asthma   . Blood transfusion without reported diagnosis   . Chronic migraine   . Common migraine with intractable migraine 02/16/2019  . Diabetes mellitus without complication ([place])   . Endometriosis   . Fatty liver   . GERD (gastroesophageal reflux disease)   . Hyperlipidemia   . Hypertension   . Hypothyroidism   . Incontinence   . Irregular heartbeat   . Multiple sclerosis ([place])   . Palpitations   . Sleep apnea     Past Surgical History:  Procedure Laterality Date  . ABDOMINAL HYSTERECTOMY    . CESAREAN SECTION     x1  . CHOLECYSTECTOMY    . ENDOMETRIAL ABLATION    . FOOT SURGERY Left 06/2018   foot reconstruction  . REPLACEMENT TOTAL KNEE Left     There were no vitals filed for this visit.  Subjective Assessment - 05/16/19 1340    Subjective  COVID-19 screen performed prior to patient entering clinic.  I got several hours of pain relief after that treatment.    Pertinent History  hx of L foot reconstruction 2019, hypothyroidism, DM, left TKA.    How long can you walk comfortably?  A community distance but with progessively worsenening pain.    Patient Stated Goals   Exercise and run again.    Currently in Pain?  Yes    Pain Score  5     Pain Location  Ankle    Pain Orientation  Right    Pain Descriptors / Indicators  Aching    Pain Type  Chronic pain    Pain Onset  More than a month ago                       [facility] Adult PT Treatment/Exercise - 05/16/19 0001      Modalities   Modalities  Electrical Stimulation;Ultrasound      Electrical Stimulation   Electrical Stimulation Location  Right lateral ankle.    Electrical Stimulation Action  Pre-mod.    Electrical Stimulation Parameters  80-150 Hz x 20 minutes.      Ultrasound   Ultrasound Location  Right lateral ankle.    Ultrasound Parameters  Combo e'stim/U/S at 1.20 W/CM2 x 10 minutes at 3.3 mHz at 50%.      Vasopneumatic   Number Minutes Vasopneumatic   20 minutes    Vasopnuematic Location   --   Right ankle.   Vasopneumatic Pressure  Low      Manual Therapy   Manual Therapy  Soft  tissue mobilization    Soft tissue mobilization  Gentle STW/M x 5 minutes to right lateral ankle and Achilles region.               PT Short Term Goals - 05/01/19 1136      PT SHORT TERM GOAL #1   Title  Patient to experience R ankle/foot pain no more than 2/10 in order to improve QOL and gait pattern    Baseline  8/24- 6/10 walking in today, on average 5-6/10    Time  2    Period  Weeks    Status  Not Met      PT SHORT TERM GOAL #2   Title  Patient to be compliant with HEP, to be updated PRN    Time  2    Period  Weeks    Status  Achieved        PT Long Term Goals - 05/11/19 1424      PT LONG TERM GOAL #1   Title  Independent with an HEP.    Time  6    Period  Weeks    Status  New      PT LONG TERM GOAL #2   Title  Increase right ankle dorsiflexion to 6- 8 degrees to normalize the patient's gait pattern.    Time  6    Period  Weeks    Status  New      PT LONG TERM GOAL #3   Title  Increase right ankle strength to 5/5 to increase stability for functional tasks.     Time  6    Period  Weeks    Status  New      PT LONG TERM GOAL #4   Title  Walk a community distance with pain not > 3/10.            Plan - 05/16/19 1343    Clinical Impression Statement  patient tolerated treatment without complaint.  She received several hours ofpain relief after last treatment.  She remains very tender to palpation over her right lateral ankle region.    Personal Factors and Comorbidities  Comorbidity 1;Comorbidity 2;Comorbidity 3+    Comorbidities  hx of L foot reconstruction 2019, hypothyroidism, DM, left TKA.    Examination-Activity Limitations  Locomotion Level;Other;Squat;Stairs    Examination-Participation Restrictions  Other;Yard Work    Merchant navy officer  Evolving/Moderate complexity    Rehab Potential  Good    PT Frequency  2x / week    PT Duration  6 weeks    PT Next Visit Plan  Ionto per signed cert.    Consulted and Agree with Plan of Care  Patient       Patient will benefit from skilled therapeutic intervention in order to improve the following deficits and impairments:  Abnormal gait, Decreased activity tolerance, Decreased strength, Decreased range of motion, Difficulty walking, Pain  Visit Diagnosis: Pain in right ankle and joints of right foot - Plan: PT plan of care cert/re-cert     Problem List Patient Active Problem List   Diagnosis Date Noted  . Class 2 severe obesity due to excess calories with serious comorbidity and body mass index (BMI) of 38.0 to 38.9 in adult ([place]) 04/12/2019  . Type 2 diabetes mellitus without complication ([place]) 16/06/9603  . Palpitations 04/12/2019  . Mild intermittent asthma without complication 54/05/8118  . Seasonal allergies 04/12/2019  . Hypothyroidism 04/12/2019  . OSA treated with BiPAP 04/12/2019  . Gastroesophageal  reflux disease 04/12/2019  . Essential hypertension 04/12/2019  . Intractable chronic migraine without aura 02/16/2019  . MS (multiple sclerosis) ([place])  02/16/2019    Allison Silva, [place] MPT 05/16/2019, 2:08 PM  [facility] [address] [place], [place], [address] Phone: [phone]   Fax:  [phone]  Name: Amy Lester. [place] MRN: [id number] Date of Birth: 1971-04-27

## 2019-05-17 ENCOUNTER — Ambulatory Visit: Payer: Self-pay | Admitting: Physical Therapy

## 2019-05-18 ENCOUNTER — Ambulatory Visit: Payer: Self-pay

## 2019-05-18 ENCOUNTER — Other Ambulatory Visit: Payer: Self-pay

## 2019-05-18 ENCOUNTER — Encounter (HOSPITAL_COMMUNITY): Payer: Medicaid Other

## 2019-05-18 DIAGNOSIS — G4733 Obstructive sleep apnea (adult) (pediatric): Secondary | ICD-10-CM

## 2019-05-19 ENCOUNTER — Ambulatory Visit: Payer: Self-pay | Admitting: Physical Therapy

## 2019-05-19 ENCOUNTER — Other Ambulatory Visit: Payer: Self-pay

## 2019-05-19 DIAGNOSIS — M25671 Stiffness of right ankle, not elsewhere classified: Secondary | ICD-10-CM

## 2019-05-19 DIAGNOSIS — M25571 Pain in right ankle and joints of right foot: Secondary | ICD-10-CM

## 2019-05-19 NOTE — Therapy (Addendum)
[facility] [place], [place], [address] Phone: [phone]   Fax:  [phone]  Physical Therapy Treatment  Patient Details  Name: Amy Lester. [place] MRN: [id number] Date of Birth: August 16, 1971 Referring Provider (PT): Arther Abbott MD   Encounter Date: 05/19/2019  PT End of Session - 05/19/19 1134    Visit Number  3    Number of Visits  12    Date for PT Re-Evaluation  06/22/19    Authorization Type  100% [facility], CAFA expiring 05/28/19    Authorization Time Period  04/13/19 to 05/11/19    Authorization - Number of Visits  10    PT Start Time  8250    PT Stop Time  1123    PT Time Calculation (min)  41 min    Activity Tolerance  Patient tolerated treatment well    Behavior During Therapy  [facility] for tasks assessed/performed       Past Medical History:  Diagnosis Date  . Asthma   . Blood transfusion without reported diagnosis   . Chronic migraine   . Common migraine with intractable migraine 02/16/2019  . Diabetes mellitus without complication ([place])   . Endometriosis   . Fatty liver   . GERD (gastroesophageal reflux disease)   . Hyperlipidemia   . Hypertension   . Hypothyroidism   . Incontinence   . Irregular heartbeat   . Multiple sclerosis ([place])   . Palpitations   . Sleep apnea     Past Surgical History:  Procedure Laterality Date  . ABDOMINAL HYSTERECTOMY    . CESAREAN SECTION     x1  . CHOLECYSTECTOMY    . ENDOMETRIAL ABLATION    . FOOT SURGERY Left 06/2018   foot reconstruction  . REPLACEMENT TOTAL KNEE Left     There were no vitals filed for this visit.                    [facility] Adult PT Treatment/Exercise - 05/19/19 0001      Modalities   Modalities  Electrical Stimulation;Iontophoresis;Ultrasound      Acupuncturist Location  Rt lateral ankle.    Electrical Stimulation Action  Pre-mod.    Electrical Stimulation Parameters  80-150 Hz x 14  minutes.      Ultrasound   Ultrasound Location  RT lateral ankle.    Ultrasound Parameters  Combo e'stim/U/S at 1.2 W/CM2 x 9 minutes at 3.3 mHz at 50%.      Iontophoresis   Type of Iontophoresis  Dexamethasone    Location  Right lateral ankle.    Dose  80 mA-min.    Time  --   8.     Vasopneumatic   Number Minutes Vasopneumatic   15 minutes    Vasopnuematic Location   --   RT ankle.   Vasopneumatic Pressure  Low               PT Short Term Goals - 05/01/19 1136      PT SHORT TERM GOAL #1   Title  Patient to experience R ankle/foot pain no more than 2/10 in order to improve QOL and gait pattern    Baseline  8/24- 6/10 walking in today, on average 5-6/10    Time  2    Period  Weeks    Status  Not Met      PT SHORT TERM GOAL #2   Title  Patient to be compliant  with HEP, to be updated PRN    Time  2    Period  Weeks    Status  Achieved        PT Long Term Goals - 05/11/19 1424      PT LONG TERM GOAL #1   Title  Independent with an HEP.    Time  6    Period  Weeks    Status  New      PT LONG TERM GOAL #2   Title  Increase right ankle dorsiflexion to 6- 8 degrees to normalize the patient's gait pattern.    Time  6    Period  Weeks    Status  New      PT LONG TERM GOAL #3   Title  Increase right ankle strength to 5/5 to increase stability for functional tasks.    Time  6    Period  Weeks    Status  New      PT LONG TERM GOAL #4   Title  Walk a community distance with pain not > 3/10.            Plan - 05/19/19 1255    Clinical Impression Statement  Patient pleased with her progress thus far stating she is getting several hours of pain relief and she feels like she is walking better.    Personal Factors and Comorbidities  Comorbidity 1;Comorbidity 2;Comorbidity 3+    Comorbidities  hx of L foot reconstruction 2019, hypothyroidism, DM, left TKA.    Examination-Activity Limitations  Locomotion Level;Other;Squat;Stairs     Examination-Participation Restrictions  Other;Yard Work    Merchant navy officer  Evolving/Moderate complexity    Rehab Potential  Good    PT Frequency  2x / week    PT Duration  6 weeks    PT Treatment/Interventions  ADLs/Self Care Home Management;Cryotherapy;Electrical Stimulation;Gait training;Ultrasound;Moist Heat;Therapeutic activities;Therapeutic exercise;Neuromuscular re-education;Patient/family education;Manual techniques;Passive range of motion;Vasopneumatic Device;Joint Manipulations    PT Home Exercise Plan  Eval: ankle circles, ankle alphabet, gastroc stretches with bolster under knees for recurvatum; 8/10: arch doming; 8/18: seated heel raises and toe raises    Consulted and Agree with Plan of Care  Patient       Patient will benefit from skilled therapeutic intervention in order to improve the following deficits and impairments:  Abnormal gait, Decreased activity tolerance, Decreased strength, Decreased range of motion, Difficulty walking, Pain  Visit Diagnosis: Pain in right ankle and joints of right foot  Stiffness of right ankle, not elsewhere classified     Problem List Patient Active Problem List   Diagnosis Date Noted  . Class 2 severe obesity due to excess calories with serious comorbidity and body mass index (BMI) of 38.0 to 38.9 in adult ([place]) 04/12/2019  . Type 2 diabetes mellitus without complication ([place]) 27/78/2423  . Palpitations 04/12/2019  . Mild intermittent asthma without complication 53/61/4431  . Seasonal allergies 04/12/2019  . Hypothyroidism 04/12/2019  . OSA treated with BiPAP 04/12/2019  . Gastroesophageal reflux disease 04/12/2019  . Essential hypertension 04/12/2019  . Intractable chronic migraine without aura 02/16/2019  . MS (multiple sclerosis) ([place]) 02/16/2019    [place], [place] MPT 05/19/2019, 1:05 PM  [facility] [address] [place], [place], [address] Phone: [phone]    Fax:  [phone]  Name: Amy Lester. [place] MRN: [id number] Date of Birth: June 19, 1971

## 2019-05-22 ENCOUNTER — Telehealth: Payer: Self-pay | Admitting: Pulmonary Disease

## 2019-05-22 ENCOUNTER — Ambulatory Visit (HOSPITAL_COMMUNITY)
Admission: RE | Admit: 2019-05-22 | Discharge: 2019-05-22 | Disposition: A | Discharge status: Home / Self Care | Payer: Self-pay | Source: Ambulatory Visit | Attending: Orthopedic Surgery | Admitting: Orthopedic Surgery

## 2019-05-22 ENCOUNTER — Ambulatory Visit: Payer: Self-pay | Admitting: Physical Therapy

## 2019-05-22 ENCOUNTER — Other Ambulatory Visit: Payer: Self-pay

## 2019-05-22 DIAGNOSIS — M25571 Pain in right ankle and joints of right foot: Secondary | ICD-10-CM

## 2019-05-22 DIAGNOSIS — G4733 Obstructive sleep apnea (adult) (pediatric): Secondary | ICD-10-CM

## 2019-05-22 DIAGNOSIS — M25671 Stiffness of right ankle, not elsewhere classified: Secondary | ICD-10-CM

## 2019-05-22 DIAGNOSIS — M76821 Posterior tibial tendinitis, right leg: Secondary | ICD-10-CM | POA: Insufficient documentation

## 2019-05-22 IMAGING — MR MR ANKLE*R* W/O CM
5 series · 40 of 40 positions shown · non-contrast
Comparison: Plain films right ankle and foot [DATE].

CLINICAL DATA: Right ankle pain for 2 months.  No known injury.

EXAM:
MRI OF THE RIGHT ANKLE WITHOUT CONTRAST
TECHNIQUE: Multiplanar, multisequence MR imaging of the ankle was performed. No
intravenous contrast was administered.

[Series 4: T2 fat-sat · axial · 3.0mm · 0.50mm/px · z∈[-95,+41]mm · 11 of 35 slices shown (1 of 2)]
[im 1/35]
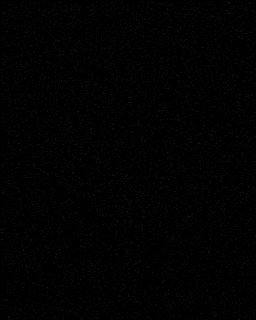
[im 4/35]
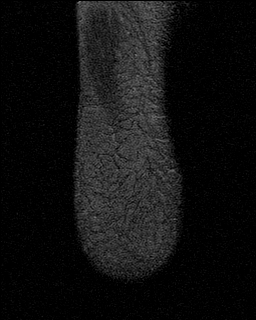
[im 7/35]
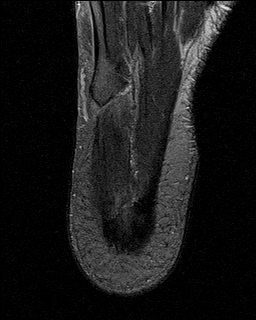
[im 11/35]
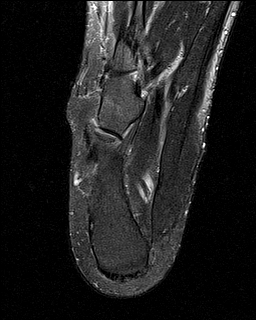
[im 14/35]
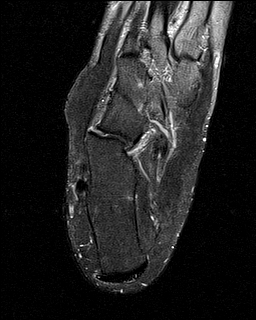
[im 18/35]
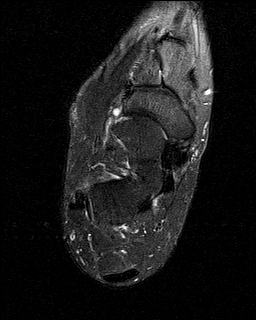
[im 21/35]
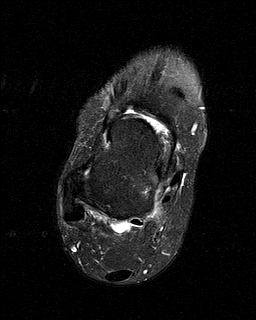
[im 24/35]
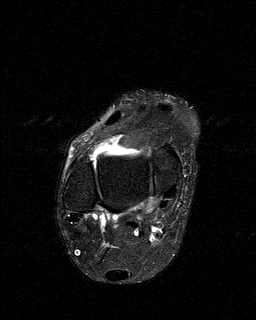
[im 28/35]
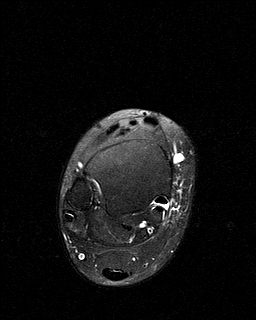
[im 31/35]
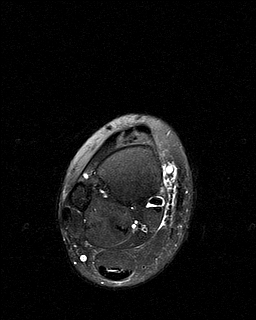
[im 35/35]
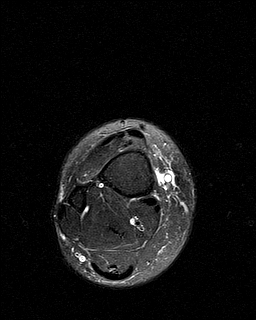

[Series 5: PD fat-sat · axial · 3.0mm · 0.62mm/px · z∈[-95,+41]mm · 10 of 35 slices shown]
[im 1/35]
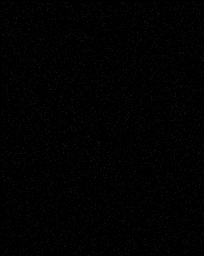
[im 4/35]
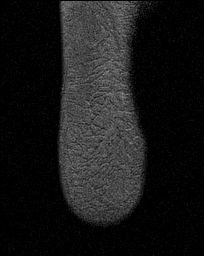
[im 8/35]
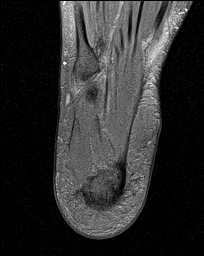
[im 12/35]
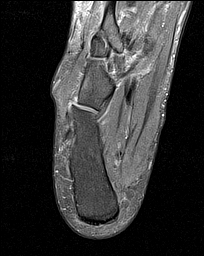
[im 16/35]
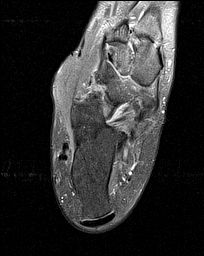
[im 19/35]
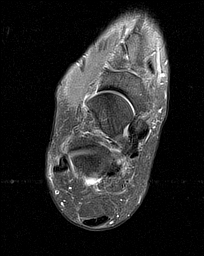
[im 23/35]
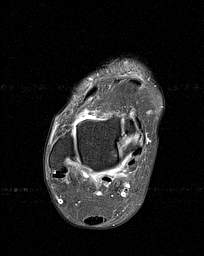
[im 27/35]
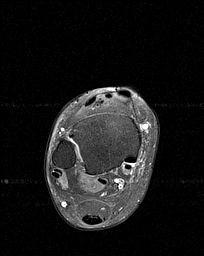
[im 31/35]
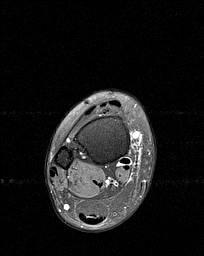
[im 35/35]
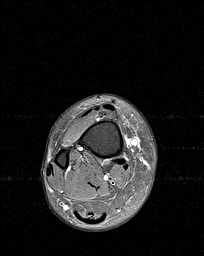

[Series 6: T2 fat-sat · coronal · 3.0mm · 0.62mm/px · 9 of 32 slices shown (2 of 2)]
[im 1/32]
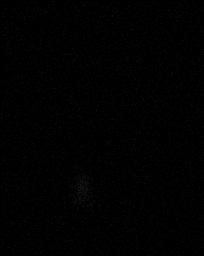
[im 4/32]
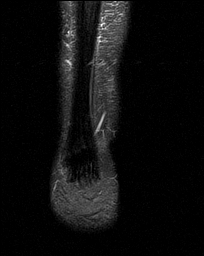
[im 8/32]
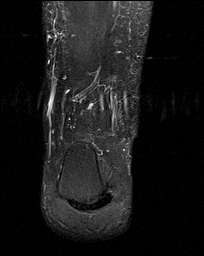
[im 12/32]
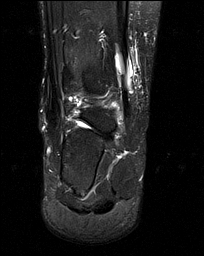
[im 16/32]
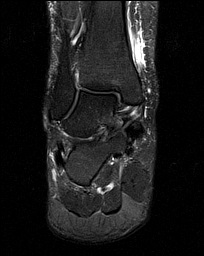
[im 20/32]
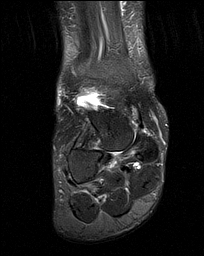
[im 24/32]
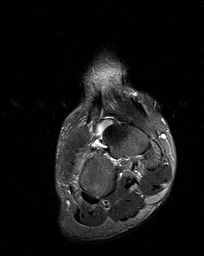
[im 28/32]
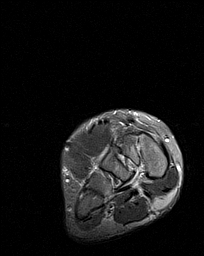
[im 32/32]
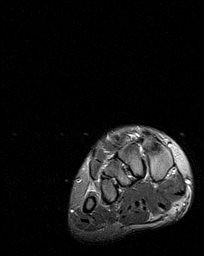

[Series 7: T1 · sagittal · 4.0mm · 0.70mm/px · 5 of 18 slices shown]
[im 1/18]
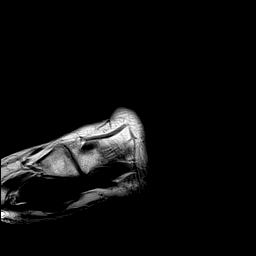
[im 5/18]
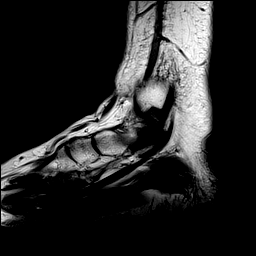
[im 9/18]
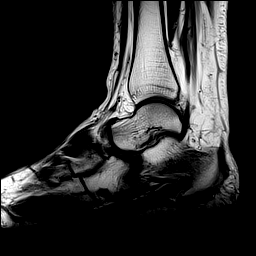
[im 13/18]
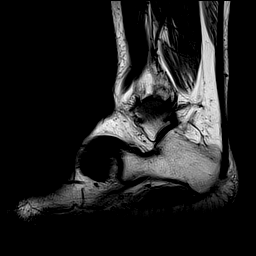
[im 18/18]
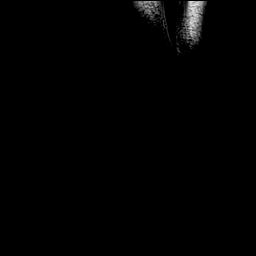

[Series 8: STIR · sagittal · 4.0mm · 0.70mm/px · 5 of 18 slices shown]
[im 1/18]
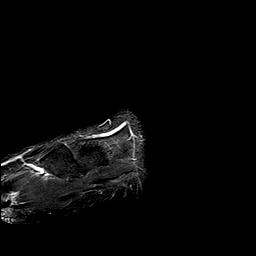
[im 5/18]
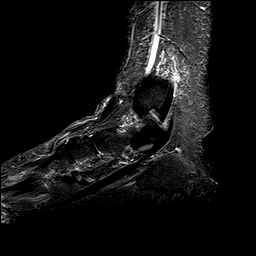
[im 9/18]
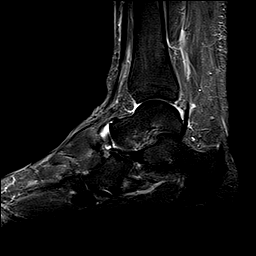
[im 13/18]
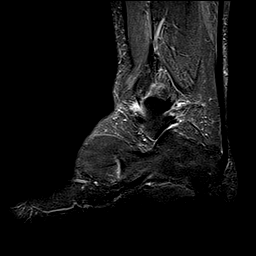
[im 18/18]
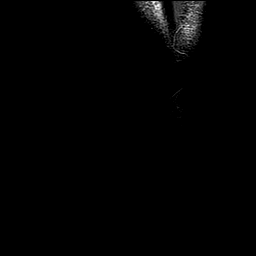

[40 of 40 positions shown; findings below may reference images not displayed]

FINDINGS: TENDONS

Peroneal: Mild intrasubstance increased T2 signal is seen in the
peroneus brevis at the level of the posterior facet of the subtalar
joint consistent with tendinosis. No tear.

Posteromedial: Intact.

Anterior: Intact.

Achilles: Intact.

Plantar Fascia: Intact.  Plantar calcaneal spur noted.

LIGAMENTS

Lateral: Intact.

Medial: Intact.

CARTILAGE

Ankle Joint: Normal.  No osteochondral lesion of the talar dome.

Subtalar Joints/Sinus Tarsi: Mild subchondral edema is seen in the
talus at the middle facet of the subtalar joint from degenerative
change. Otherwise negative.

Bones: No fracture or worrisome lesion.

Other: None.
IMPRESSION: Mild peroneus brevis tendinosis without tear.

Mild osteoarthritis middle facet of the subtalar joint.

Normal appearing plantar fascia with a calcaneal spur noted.

## 2019-05-22 NOTE — Telephone Encounter (Signed)
Please also let her know that there was enough information during the test to determine presence of sleep apnea even though she had trouble sleeping during the test night.

## 2019-05-22 NOTE — Telephone Encounter (Signed)
ATC pt - Mailbox full and unable to leave message to call - Will call back

## 2019-05-22 NOTE — Therapy (Signed)
[facility] [place], [place], [address] Phone: [phone]   Fax:  [phone]  Physical Therapy Treatment  Patient Details  Name: Amy Lester MRN: [id number] Date of Birth: Jul 10, 1971 Referring Provider (PT): Arther Abbott MD   Encounter Date: 05/22/2019  PT End of Session - 05/22/19 1441    Visit Number  4    Number of Visits  12    Date for PT Re-Evaluation  06/22/19    Authorization Type  100% [facility], CAFA expiring 05/28/19    Authorization Time Period  04/13/19 to 05/11/19    PT Start Time  0145    PT Stop Time  0229    PT Time Calculation (min)  44 min    Activity Tolerance  Patient tolerated treatment well    Behavior During Therapy  [facility] for tasks assessed/performed       Past Medical History:  Diagnosis Date  . Asthma   . Blood transfusion without reported diagnosis   . Chronic migraine   . Common migraine with intractable migraine 02/16/2019  . Diabetes mellitus without complication ([place])   . Endometriosis   . Fatty liver   . GERD (gastroesophageal reflux disease)   . Hyperlipidemia   . Hypertension   . Hypothyroidism   . Incontinence   . Irregular heartbeat   . Multiple sclerosis ([place])   . Palpitations   . Sleep apnea     Past Surgical History:  Procedure Laterality Date  . ABDOMINAL HYSTERECTOMY    . CESAREAN SECTION     x1  . CHOLECYSTECTOMY    . ENDOMETRIAL ABLATION    . FOOT SURGERY Left 06/2018   foot reconstruction  . REPLACEMENT TOTAL KNEE Left     There were no vitals filed for this visit.  Subjective Assessment - 05/22/19 1440    Subjective  COVID-19 screen performed prior to patient entering clinic.  My ankle has been feeling very good, especially today.    Pertinent History  hx of L foot reconstruction 2019, hypothyroidism, DM, left TKA.    How long can you walk comfortably?  A community distance but with progessively worsenening pain.    Patient Stated Goals   Exercise and run again.    Currently in Pain?  Yes    Pain Score  4     Pain Location  Ankle    Pain Orientation  Right    Pain Descriptors / Indicators  Aching    Pain Type  Chronic pain    Pain Onset  More than a month ago    Pain Frequency  Constant                       [facility] Adult PT Treatment/Exercise - 05/22/19 0001      Modalities   Modalities  Electrical Stimulation;Iontophoresis;Vasopneumatic      Acupuncturist Location  RT lateral ankle.    Electrical Stimulation Action  Pre-mod.    Electrical Stimulation Parameters  80-150 Hz x 20 minutes.      Ultrasound   Ultrasound Location  RT Lat ankle.    Ultrasound Parameters  Combo e'stim/U/S at 1.20 W/CM2 x 8 minutes at 3.3 mHz at 50%.      Iontophoresis   Type of Iontophoresis  Dexamethasone    Location  Right lateral ankle.    Dose  80 mA-Min.    Time  --   8  Vasopneumatic   Number Minutes Vasopneumatic   20 minutes    Vasopnuematic Location   --   Right ankle.   Vasopneumatic Pressure  Medium               PT Short Term Goals - 05/01/19 1136      PT SHORT TERM GOAL #1   Title  Patient to experience R ankle/foot pain no more than 2/10 in order to improve QOL and gait pattern    Baseline  8/24- 6/10 walking in today, on average 5-6/10    Time  2    Period  Weeks    Status  Not Met      PT SHORT TERM GOAL #2   Title  Patient to be compliant with HEP, to be updated PRN    Time  2    Period  Weeks    Status  Achieved        PT Long Term Goals - 05/11/19 1424      PT LONG TERM GOAL #1   Title  Independent with an HEP.    Time  6    Period  Weeks    Status  New      PT LONG TERM GOAL #2   Title  Increase right ankle dorsiflexion to 6- 8 degrees to normalize the patient's gait pattern.    Time  6    Period  Weeks    Status  New      PT LONG TERM GOAL #3   Title  Increase right ankle strength to 5/5 to increase stability for functional  tasks.    Time  6    Period  Weeks    Status  New      PT LONG TERM GOAL #4   Title  Walk a community distance with pain not > 3/10.            Plan - 05/22/19 1445    Clinical Impression Statement  Patient has been doing very well with treatment.  She liked the iontophoreis treatment a great deal.    Personal Factors and Comorbidities  Comorbidity 1;Comorbidity 2;Comorbidity 3+    Comorbidities  hx of L foot reconstruction 2019, hypothyroidism, DM, left TKA.    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    PT Home Exercise Plan  Eval: ankle circles, ankle alphabet, gastroc stretches with bolster under knees for recurvatum; 8/10: arch doming; 8/18: seated heel raises and toe raises    Consulted and Agree with Plan of Care  Patient       Patient will benefit from skilled therapeutic intervention in order to improve the following deficits and impairments:  Abnormal gait, Decreased activity tolerance, Decreased strength, Decreased range of motion, Difficulty walking, Pain  Visit Diagnosis: Pain in right ankle and joints of right foot  Stiffness of right ankle, not elsewhere classified     Problem List Patient Active Problem List   Diagnosis Date Noted  . Class 2 severe obesity due to excess calories with serious comorbidity and body mass  index (BMI) of 38.0 to 38.9 in adult [facility]) 04/12/2019  .  Type 2 diabetes mellitus without complication ([place]) 55/21/7471  . Palpitations 04/12/2019  . Mild intermittent asthma without complication 59/53/9672  . Seasonal allergies 04/12/2019  . Hypothyroidism 04/12/2019  . OSA treated with BiPAP 04/12/2019  . Gastroesophageal reflux disease 04/12/2019  . Essential hypertension 04/12/2019  . Intractable chronic migraine without aura 02/16/2019  . MS (multiple sclerosis) ([place]) 02/16/2019    Tiphani Mells, [place] MPT 05/22/2019, 2:47 PM  [facility] [address] [place], [place], [address] Phone: [phone]   Fax:  [phone]  Name: Amy Lester MRN: [id number] Date of Birth: 1971/06/10

## 2019-05-22 NOTE — Telephone Encounter (Signed)
HST 05/18/19 >> AHI 46.5, SpO2 low 89%   Please inform her that her sleep study shows severe obstructive sleep apnea.  She should continue using CPAP until she has assessment by ENT to determine if she is a candidate for surgical intervention for treatment of her sleep apnea.

## 2019-05-23 ENCOUNTER — Telehealth: Payer: Self-pay | Admitting: Orthopedic Surgery

## 2019-05-23 NOTE — Telephone Encounter (Signed)
Call to leave results patient's mailbox full.  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

## 2019-05-24 ENCOUNTER — Other Ambulatory Visit: Payer: Self-pay | Admitting: Neurology

## 2019-05-24 ENCOUNTER — Other Ambulatory Visit: Payer: Self-pay | Admitting: Physician Assistant

## 2019-05-25 ENCOUNTER — Other Ambulatory Visit: Payer: Self-pay

## 2019-05-25 ENCOUNTER — Ambulatory Visit: Payer: Self-pay | Admitting: *Deleted

## 2019-05-25 DIAGNOSIS — M6281 Muscle weakness (generalized): Secondary | ICD-10-CM

## 2019-05-25 DIAGNOSIS — M25571 Pain in right ankle and joints of right foot: Secondary | ICD-10-CM

## 2019-05-25 DIAGNOSIS — R2689 Other abnormalities of gait and mobility: Secondary | ICD-10-CM

## 2019-05-25 DIAGNOSIS — R2681 Unsteadiness on feet: Secondary | ICD-10-CM

## 2019-05-25 DIAGNOSIS — M25671 Stiffness of right ankle, not elsewhere classified: Secondary | ICD-10-CM

## 2019-05-25 NOTE — Therapy (Signed)
[facility] [place], [place], [address] Phone: [phone]   Fax:  [phone]  Physical Therapy Treatment  Patient Details  Name: Amy Lester MRN: [id number] Date of Birth: 22-Aug-1971 Referring Provider (PT): Arther Abbott MD   Encounter Date: 05/25/2019  PT End of Session - 05/25/19 1433    Visit Number  5    Number of Visits  12    Date for PT Re-Evaluation  06/22/19    Authorization Type  100% [facility], CAFA expiring 05/28/19    Authorization Time Period  04/13/19 to 05/11/19    PT Start Time  1345    PT Stop Time  1433    PT Time Calculation (min)  48 min       Past Medical History:  Diagnosis Date  . Asthma   . Blood transfusion without reported diagnosis   . Chronic migraine   . Common migraine with intractable migraine 02/16/2019  . Diabetes mellitus without complication ([place])   . Endometriosis   . Fatty liver   . GERD (gastroesophageal reflux disease)   . Hyperlipidemia   . Hypertension   . Hypothyroidism   . Incontinence   . Irregular heartbeat   . Multiple sclerosis ([place])   . Palpitations   . Sleep apnea     Past Surgical History:  Procedure Laterality Date  . ABDOMINAL HYSTERECTOMY    . CESAREAN SECTION     x1  . CHOLECYSTECTOMY    . ENDOMETRIAL ABLATION    . FOOT SURGERY Left 06/2018   foot reconstruction  . REPLACEMENT TOTAL KNEE Left     There were no vitals filed for this visit.                    [facility] Adult PT Treatment/Exercise - 05/25/19 0001      Exercises   Exercises  Ankle      Modalities   Modalities  Electrical Stimulation;Iontophoresis;Vasopneumatic      Acupuncturist Location  RT lateral ankle.    Child psychotherapist Parameters  80-'150hz'$ x 15 mins      Ultrasound   Ultrasound Location  RT ankle lateral aspect    Ultrasound Parameters  Combo estim [place] 1. w/cm2 x  10 mins at 3.3 mhz      Iontophoresis   Type of Iontophoresis  Dexamethasone    Location  Right lateral ankle.    Dose  80 mA-Min.    Time  --   8     Vasopneumatic   Number Minutes Vasopneumatic   15 minutes    Vasopnuematic Location   --   Right ankle.   Vasopneumatic Pressure  Medium    Vasopneumatic Temperature   36      Manual Therapy   Manual Therapy  Soft tissue mobilization    Soft tissue mobilization  Gentle STW/M x 5 minutes to right lateral ankle and Achilles region.      Ankle Exercises: Seated   Other Seated Ankle Exercises  Rockerboard DF/PF x 3 mins   pain with DF              PT Short Term Goals - 05/01/19 1136      PT SHORT TERM GOAL #1   Title  Patient to experience R ankle/foot pain no more than 2/10 in order to improve QOL and gait pattern    Baseline  8/24- 6/10  walking in today, on average 5-6/10    Time  2    Period  Weeks    Status  Not Met      PT SHORT TERM GOAL #2   Title  Patient to be compliant with HEP, to be updated PRN    Time  2    Period  Weeks    Status  Achieved        PT Long Term Goals - 05/25/19 1455      PT LONG TERM GOAL #1   Title  Independent with an HEP.    Baseline  8/24- flowsheets    Time  6    Period  Weeks    Status  On-going      PT LONG TERM GOAL #2   Title  Increase right ankle dorsiflexion to 6- 8 degrees to normalize the patient's gait pattern.    Time  6    Period  Weeks    Status  On-going      PT LONG TERM GOAL #3   Title  Increase right ankle strength to 5/5 to increase stability for functional tasks.    Time  6    Period  Weeks    Status  On-going      PT LONG TERM GOAL #4   Title  Walk a community distance with pain not > 3/10.    Baseline  8/24- bike deferred, can't swim due to gym hours    Time  4    Period  Weeks    Status  Not Met            Plan - 05/25/19 1447    Clinical Impression Statement  Pt arrived today doing fairly well, but continues to have pain and  sensitivity along RT ankle lateral aspect.She did well with Rx and reports decreased pain after each session and thinks that ionto patch is helping. Sitting rockerboard PF and DF was performed today with pain experiened  near achilles with DF ROM. Normal modality response.today    Personal Factors and Comorbidities  Comorbidity 1;Comorbidity 2;Comorbidity 3+    Comorbidities  hx of L foot reconstruction 2019, hypothyroidism, DM, left TKA.    Stability/Clinical Decision Making  Evolving/Moderate complexity    Rehab Potential  Good    PT Frequency  2x / week    PT Duration  6 weeks    PT Treatment/Interventions  ADLs/Self Care Home Management;Cryotherapy;Electrical Stimulation;Gait training;Ultrasound;Moist Heat;Therapeutic activities;Therapeutic exercise;Neuromuscular re-education;Patient/family education;Manual techniques;Passive range of motion;Vasopneumatic Device;Joint Manipulations    PT Next Visit Plan  Ionto per signed cert.    PT Home Exercise Plan  Eval: ankle circles, ankle alphabet, gastroc stretches with bolster under knees for recurvatum; 8/10: arch doming; 8/18: seated heel raises and toe raises    Consulted and Agree with Plan of Care  Patient       Patient will benefit from skilled therapeutic intervention in order to improve the following deficits and impairments:  Abnormal gait, Decreased activity tolerance, Decreased strength, Decreased range of motion, Difficulty walking, Pain  Visit Diagnosis: Pain in right ankle and joints of right foot  Stiffness of right ankle, not elsewhere classified  Other abnormalities of gait and mobility  Unsteadiness on feet  Muscle weakness (generalized)     Problem List Patient Active Problem List   Diagnosis Date Noted  . Class 2 severe obesity due to excess calories with serious comorbidity and body mass index (BMI) of 38.0 to 38.9 in adult ([place]) 04/12/2019  .  Type 2 diabetes mellitus without complication ([place]) 29/29/0903  .  Palpitations 04/12/2019  . Mild intermittent asthma without complication 01/49/9692  . Seasonal allergies 04/12/2019  . Hypothyroidism 04/12/2019  . OSA treated with BiPAP 04/12/2019  . Gastroesophageal reflux disease 04/12/2019  . Essential hypertension 04/12/2019  . Intractable chronic migraine without aura 02/16/2019  . MS (multiple sclerosis) ([place]) 02/16/2019    Jenilyn Magana,CHRIS, PTA 05/25/2019, 2:59 PM  [facility] [address] [place], [place], [address] Phone: [phone]   Fax:  [phone]  Name: Amy Lester MRN: [id number] Date of Birth: [birth year]

## 2019-05-26 ENCOUNTER — Ambulatory Visit: Payer: Self-pay | Admitting: Physical Therapy

## 2019-05-26 ENCOUNTER — Other Ambulatory Visit: Payer: Self-pay

## 2019-05-26 ENCOUNTER — Ambulatory Visit (HOSPITAL_COMMUNITY)
Admission: EM | Admit: 2019-05-26 | Discharge: 2019-05-26 | Disposition: A | Discharge status: Home / Self Care | Payer: Self-pay | Attending: Family Medicine | Admitting: Family Medicine

## 2019-05-26 ENCOUNTER — Encounter (HOSPITAL_COMMUNITY): Payer: Self-pay

## 2019-05-26 DIAGNOSIS — N898 Other specified noninflammatory disorders of vagina: Secondary | ICD-10-CM | POA: Insufficient documentation

## 2019-05-26 DIAGNOSIS — M25671 Stiffness of right ankle, not elsewhere classified: Secondary | ICD-10-CM

## 2019-05-26 DIAGNOSIS — M25571 Pain in right ankle and joints of right foot: Secondary | ICD-10-CM

## 2019-05-26 MED ORDER — METRONIDAZOLE 500 MG PO TABS: 500.0000 mg | ORAL_TABLET | Freq: Two times a day (BID) | ORAL | 0 refills | Status: DC

## 2019-05-26 NOTE — Therapy (Addendum)
[facility] [place], [place], [address] Phone: [phone]   Fax:  [phone]  Physical Therapy Treatment  Patient Details  Name: Amy Lester. Dacy MRN: [id number] Date of Birth: [birth year] Referring Provider (PT): Arther Abbott MD   Encounter Date: 05/26/2019  PT End of Session - 05/26/19 1222    Visit Number  6    Number of Visits  12    Date for PT Re-Evaluation  06/22/19    Authorization Type  [facility]% [facility], CAFA expiring 05/28/19    Authorization Time Period  04/13/19 to 05/11/19    PT Start Time  0958   Late arrival.   PT Stop Time  1030    PT Time Calculation (min)  32 min    Activity Tolerance  Patient tolerated treatment well    Behavior During Therapy  [facility] for tasks assessed/performed       Past Medical History:  Diagnosis Date  . Asthma   . Blood transfusion without reported diagnosis   . Chronic migraine   . Common migraine with intractable migraine 02/16/2019  . Diabetes mellitus without complication ([place])   . Endometriosis   . Fatty liver   . GERD (gastroesophageal reflux disease)   . Hyperlipidemia   . Hypertension   . Hypothyroidism   . Incontinence   . Irregular heartbeat   . Multiple sclerosis ([place])   . Palpitations   . Sleep apnea     Past Surgical History:  Procedure Laterality Date  . ABDOMINAL HYSTERECTOMY    . CESAREAN SECTION     x1  . CHOLECYSTECTOMY    . ENDOMETRIAL ABLATION    . FOOT SURGERY Left 06/2018   foot reconstruction  . REPLACEMENT TOTAL KNEE Left     There were no vitals filed for this visit.  Subjective Assessment - 05/26/19 1224    Subjective  COVID-19 screen performed prior to patient entering clinic.  Ankle overall feeling better but a little more swelling today.    Pertinent History  hx of L foot reconstruction 2019, hypothyroidism, DM, left TKA.    How long can you walk comfortably?  A community distance but with progessively worsenening pain.     Patient Stated Goals  Exercise and run again.    Currently in Pain?  Yes    Pain Score  4     Pain Location  Ankle    Pain Orientation  Right    Pain Descriptors / Indicators  Aching    Pain Onset  More than a month ago                       [facility] Adult PT Treatment/Exercise - 05/26/19 0001      Modalities   Modalities  Electrical Stimulation;Vasopneumatic      Electrical Stimulation   Electrical Stimulation Location  RT lateral ankle.    Electrical Stimulation Action  Pre-mod.    Electrical Stimulation Parameters  80-150 Hz x 20 minutes.      Iontophoresis   Type of Iontophoresis  Dexamethasone    Location  RT lat ankle.    Dose  80 mA-Min.    Time  --   8.     Vasopneumatic   Number Minutes Vasopneumatic   20 minutes    Vasopnuematic Location   --   Right ankle.   Vasopneumatic Pressure  Medium               PT  Short Term Goals - 05/01/19 1136      PT SHORT TERM GOAL #1   Title  Patient to experience R ankle/foot pain no more than 2/10 in order to improve QOL and gait pattern    Baseline  8/24- 6/10 walking in today, on average 5-6/10    Time  2    Period  Weeks    Status  Not Met      PT SHORT TERM GOAL #2   Title  Patient to be compliant with HEP, to be updated PRN    Time  2    Period  Weeks    Status  Achieved        PT Long Term Goals - 05/25/19 1455      PT LONG TERM GOAL #1   Title  Independent with an HEP.    Baseline  8/24- flowsheets    Time  6    Period  Weeks    Status  On-going      PT LONG TERM GOAL #2   Title  Increase right ankle dorsiflexion to 6- 8 degrees to normalize the patient's gait pattern.    Time  6    Period  Weeks    Status  On-going      PT LONG TERM GOAL #3   Title  Increase right ankle strength to 5/5 to increase stability for functional tasks.    Time  6    Period  Weeks    Status  On-going      PT LONG TERM GOAL #4   Title  Walk a community distance with pain not > 3/10.    Baseline   8/24- bike deferred, can't swim due to gym hours    Time  4    Period  Weeks    Status  Not Met            Plan - 05/26/19 1227    Clinical Impression Statement  Patient pleased with her progress though she had a litle more swelling around her right lateral malleolus.    Personal Factors and Comorbidities  Comorbidity 1;Comorbidity 2;Comorbidity 3+    Comorbidities  hx of L foot reconstruction 2019, hypothyroidism, DM, left TKA.    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    PT Home Exercise Plan  Eval: ankle circles, ankle alphabet, gastroc stretches with bolster under knees for recurvatum; 8/10: arch doming; 8/18: seated heel raises and toe raises    Consulted and Agree with Plan of Care  Patient       Patient will benefit from skilled therapeutic intervention in order to improve the following deficits and impairments:  Abnormal gait, Decreased activity tolerance, Decreased strength, Decreased range of motion, Difficulty walking, Pain  Visit Diagnosis: Pain in right ankle and joints of right foot  Stiffness of right ankle, not elsewhere classified     Problem List Patient Active Problem List   Diagnosis Date Noted  . Class 2 severe obesity due to excess calories with serious comorbidity and body mass index (BMI) of  38.0 to 38.9 in adult [facility]) 04/12/2019  . Type 2 diabetes mellitus without  complication ([place]) 96/89/5702  . Palpitations 04/12/2019  . Mild intermittent asthma without complication 20/26/6916  . Seasonal allergies 04/12/2019  . Hypothyroidism 04/12/2019  . OSA treated with BiPAP 04/12/2019  . Gastroesophageal reflux disease 04/12/2019  . Essential hypertension 04/12/2019  . Intractable chronic migraine without aura 02/16/2019  . MS (multiple sclerosis) ([place]) 02/16/2019   PHYSICAL THERAPY DISCHARGE SUMMARY  Visits from Start of Care: 6.  Current functional level related to goals / functional outcomes: See above.   Remaining deficits: See below.   Education / Equipment: HEP. Plan: Patient agrees to discharge.  Patient goals were not met. Patient is being discharged due to not returning since the last visit.  ?????      Alesia Oshields, [place] MPT 05/26/2019, 12:31 PM  [facility] [address] [place], [place], [address] Phone: [phone]   Fax:  [phone]  Name: Illene Sweeting. Vasudevan MRN: [id number] Date of Birth: 08-31-1971

## 2019-05-26 NOTE — ED Triage Notes (Signed)
Patient report having a light yellow vaginal discharge with a strong odor and left side pelvic pain, she described the pain like someone is stabbing her from the front to the back. She is taking Tylenol and Ibuprofen.

## 2019-05-26 NOTE — Discharge Instructions (Signed)
Treating you for bacterial vaginosis We will send your swab for testing and call you with any positive results OB/GYN contact put on discharge instructions. You can try using the Flonase for the ear discomfort Follow up as needed for continued or worsening symptoms

## 2019-05-27 NOTE — ED Provider Notes (Signed)
[facility]    CSN: [id number] Arrival date & time: 05/26/19  1305      History   Chief Complaint Chief Complaint  Patient presents with  . Vaginal Discharge  . Pelvic Pain    HPI Amy Lester is a 48 y.o. female.   Patient is a 48 year old female past medical history of asthma, migraine, diabetes, GERD, hyperlipidemia, hypertension, hypothyroid, MS. she presents today with white/yellow purulent vaginal discharge with strong odor and left lower pelvic discomfort.  The discharge is constant.  The abdominal pain is waxing and waning.  Feels a stabbing.  She has been taking Tylenol and ibuprofen with some relief.  Denies any itching or irritation the vaginal area.  Denies any dysuria, hematuria or urinary frequency.  Past medical history of hysterectomy.  Currently sexually with husband and not concerned for STDs.  No fever, nausea or vomiting. No flank pain.  ROS per HPI      Past Medical History:  Diagnosis Date  . Asthma   . Blood transfusion without reported diagnosis   . Chronic migraine   . Common migraine with intractable migraine 02/16/2019  . Diabetes mellitus without complication (HCC)   . Endometriosis   . Fatty liver   . GERD (gastroesophageal reflux disease)   . Hyperlipidemia   . Hypertension   . Hypothyroidism   . Incontinence   . Irregular heartbeat   . Multiple sclerosis (HCC)   . Palpitations   . Sleep apnea     Patient Active Problem List   Diagnosis Date Noted  . Class 2 severe obesity due to excess calories with serious comorbidity and body mass index (BMI) of 38.0 to 38.9 in adult (HCC) 04/12/2019  . Type 2 diabetes mellitus without complication (HCC) 04/12/2019  . Palpitations 04/12/2019  . Mild intermittent asthma without complication 04/12/2019  . Seasonal allergies 04/12/2019  . Hypothyroidism 04/12/2019  . OSA treated with BiPAP 04/12/2019  . Gastroesophageal reflux disease 04/12/2019  . Essential hypertension 04/12/2019   . Intractable chronic migraine without aura 02/16/2019  . MS (multiple sclerosis) (HCC) 02/16/2019    Past Surgical History:  Procedure Laterality Date  . ABDOMINAL HYSTERECTOMY    . CESAREAN SECTION     x1  . CHOLECYSTECTOMY    . ENDOMETRIAL ABLATION    . FOOT SURGERY Left 06/2018   foot reconstruction  . REPLACEMENT TOTAL KNEE Left     OB History   No obstetric history on file.      Home Medications    Prior to Admission medications   Medication Sig Start Date End Date Taking? Authorizing Provider  albuterol (VENTOLIN HFA) 108 (90 Base) MCG/ACT inhaler Inhale 3 puffs into the lungs every 6 (six) hours as needed for wheezing or shortness of breath. 03/28/19   Jacquelin HawkingMcElroy, Shannon, PA-C  atorvastatin (LIPITOR) 10 MG tablet Take 1 tablet (10 mg total) by mouth daily. 02/27/19   Jacquelin HawkingMcElroy, Shannon, PA-C  cetirizine (ZYRTEC) 10 MG tablet Take 1 tablet (10 mg total) by mouth daily. 03/28/19   Jacquelin HawkingMcElroy, Shannon, PA-C  clotrimazole-betamethasone (LOTRISONE) cream Apply 1 application topically as needed. 04/12/19   Deeann SaintBanks, Shannon R, MD  estradiol (EVAMIST) 1.53 MG/SPRAY transdermal spray Place 3 sprays onto the skin daily.    [provider]  glipiZIDE (GLUCOTROL) 5 MG tablet Take 1 tablet (5 mg total) by mouth daily before breakfast. 02/27/19   Jacquelin HawkingMcElroy, Shannon, PA-C  HYDROCODONE-ACETAMINOPHEN PO Take 1 tablet by mouth as needed.    [provider]  levothyroxine (SYNTHROID) 200 MCG tablet Take 1 tablet (200 mcg total) by mouth daily before breakfast. 02/27/19   Jacquelin HawkingMcElroy, Shannon, PA-C  levothyroxine (SYNTHROID) 25 MCG tablet Take 1 tablet (25 mcg total) by mouth daily before breakfast. 02/27/19   Jacquelin HawkingMcElroy, Shannon, PA-C  liothyronine (CYTOMEL) 5 MCG tablet Take 5 mcg by mouth daily.    [provider]  lisinopril (ZESTRIL) 10 MG tablet Take 1 tablet (10 mg total) by mouth daily. 02/27/19   Jacquelin HawkingMcElroy, Shannon, PA-C  metoprolol succinate (TOPROL-XL) 100 MG 24 hr tablet Take 1  tablet (100 mg total) by mouth daily. Take with or immediately following a meal. 05/09/19 08/07/19  Strader, Lennart PallBrittany M, PA-C  metroNIDAZOLE (FLAGYL) 500 MG tablet Take 1 tablet (500 mg total) by mouth 2 (two) times daily. 05/26/19   Dahlia ByesBast, Christien Frankl A, NP  metroNIDAZOLE (METROGEL) 1 % gel Apply 1 application topically daily.    [provider]  montelukast (SINGULAIR) 10 MG tablet Take 1 tablet (10 mg total) by mouth at bedtime. 05/04/19   Coralyn HellingSood, Vineet, MD  Multiple Vitamins-Minerals (MULTIVITAMIN WITH MINERALS) tablet Take 1 tablet by mouth daily.    [provider]  nystatin cream (MYCOSTATIN) Apply 1 application topically daily as needed for dry skin.    [provider]  Olopatadine HCl (PAZEO) 0.7 % SOLN Apply to eye every morning.    [provider]  omeprazole (PRILOSEC) 40 MG capsule Take 1 capsule (40 mg total) by mouth daily. 03/28/19   Jacquelin HawkingMcElroy, Shannon, PA-C  ondansetron (ZOFRAN) 8 MG tablet Take 8 mg by mouth as needed for nausea or vomiting.    [provider]  Red Yeast Rice Extract (RED YEAST RICE PO) Take 1 tablet by mouth daily.     [provider]  Riboflavin (VITAMIN B-2 PO) Take by mouth.    [provider]  rizatriptan (MAXALT) 10 MG tablet Take 1 tablet (10 mg total) by mouth 3 (three) times daily as needed for migraine. May repeat in 2 hours if needed 03/31/19   York SpanielWillis, Charles K, MD  topiramate (TOPAMAX) 25 MG tablet TAKE 1 Tablet BY MOUTH ONCE NIGHTLY FOR 1 WEEK, THEN TAKE 2 Tablets  BY MOUTH ONCE NIGHTLY FOR 1 WEEK, THEN TAKE 3 Tablets  BY MOUTH ONCE NIGHTLY 05/25/19   York SpanielWillis, Charles K, MD  TRETINOIN EX Apply topically daily.    [provider]  triamcinolone cream (KENALOG) 0.1 % Apply 1 application topically 2 (two) times daily as needed. 03/28/19   Jacquelin HawkingMcElroy, Shannon, PA-C  triamterene-hydrochlorothiazide (MAXZIDE-25) 37.5-25 MG tablet Take 1 tablet by mouth daily.    [provider]    Family History  Family History  Problem Relation Age of Onset  . Heart attack Mother   . Stroke Mother   . Cancer Mother        breast cancer  . Hyperthyroidism Mother   . Hyperlipidemia Mother   . Cancer Father        lymph nodes, liver cancer  . Other Daughter        Fine Gold Type II Syndrome and cognetive issues  . Memory loss Daughter   . Other Son        Fine Gold Syndrome and BPES    Social History Social History   Tobacco Use  . Smoking status: Never Smoker  . Smokeless tobacco: Never Used  Substance Use Topics  . Alcohol use: Yes    Frequency: Never    Comment: once a year  .  Drug use: Never     Allergies   Percocet [oxycodone-acetaminophen]   Review of Systems Review of Systems   Physical Exam Triage Vital Signs ED Triage Vitals  Enc Vitals Group     BP 05/26/19 1321 123/82     Pulse --      Resp 05/26/19 1321 16     Temp 05/26/19 1321 98.6 F (37 C)     Temp Source 05/26/19 1321 Oral     SpO2 05/26/19 1321 96 %     Weight --      Height --      Head Circumference --      Peak Flow --      Pain Score 05/26/19 1318 8     Pain Loc --      Pain Edu? --      Excl. in [place]? --    No data found.  Updated Vital Signs BP 123/82 (BP Location: Right Arm)   Temp 98.6 F (37 C) (Oral)   Resp 16   SpO2 96%   Visual Acuity Right Eye Distance:   Left Eye Distance:   Bilateral Distance:    Right Eye Near:   Left Eye Near:    Bilateral Near:     Physical Exam Vitals signs and nursing note reviewed.  Constitutional:      General: She is not in acute distress.    Appearance: Normal appearance. She is not ill-appearing, toxic-appearing or diaphoretic.  HENT:     Head: Normocephalic.     Nose: Nose normal.     Mouth/Throat:     Pharynx: Oropharynx is clear.  Eyes:     Conjunctiva/sclera: Conjunctivae normal.  Neck:     Musculoskeletal: Normal range of motion.  Pulmonary:     Effort: Pulmonary effort is normal.  Abdominal:     Palpations: Abdomen is  soft.     Tenderness: There is no abdominal tenderness.     Comments: No specific pelvic tenderness with palpation.   Genitourinary:    Comments: Deferred  Musculoskeletal: Normal range of motion.  Skin:    General: Skin is warm and dry.     Findings: No rash.  Neurological:     Mental Status: She is alert.  Psychiatric:        Mood and Affect: Mood normal.      UC Treatments / Results  Labs (all labs ordered are listed, but only abnormal results are displayed) Labs Reviewed  CERVICOVAGINAL ANCILLARY ONLY    EKG   Radiology No results found.  Procedures Procedures (including critical care time)  Medications Ordered in UC Medications - No data to display  Initial Impression / Assessment and Plan / UC Course  I have reviewed the triage vital signs and the nursing notes.  Pertinent labs & imaging results that were available during my care of the patient were reviewed by me and considered in my medical decision making (see chart for details).     Treating for BV based on symptoms No concern for PID Sending swab for testing.   Final Clinical Impressions(s) / UC Diagnoses   Final diagnoses:  Vaginal discharge     Discharge Instructions     Treating you for bacterial vaginosis We will send your swab for testing and call you with any positive results OB/GYN contact put on discharge instructions. You can try using the Flonase for the ear discomfort Follow up as needed for continued or worsening symptoms  ED Prescriptions    Medication Sig Dispense Auth. Provider   metroNIDAZOLE (FLAGYL) 500 MG tablet Take 1 tablet (500 mg total) by mouth 2 (two) times daily. 14 tablet Azzan Butler A, NP     PDMP not reviewed this encounter.   Janace Aris, NP 05/27/19 1006

## 2019-05-28 ENCOUNTER — Other Ambulatory Visit: Payer: Self-pay | Admitting: General Practice

## 2019-05-29 LAB — CERVICOVAGINAL ANCILLARY ONLY
Bacterial Vaginitis (gardnerella): NEGATIVE
Candida Glabrata: NEGATIVE
Candida Vaginitis: NEGATIVE
Molecular Disclaimer: NEGATIVE
Molecular Disclaimer: NEGATIVE
Molecular Disclaimer: NEGATIVE
Molecular Disclaimer: NORMAL
Trichomonas: NEGATIVE

## 2019-05-30 LAB — CERVICOVAGINAL ANCILLARY ONLY
Chlamydia: NEGATIVE
Neisseria Gonorrhea: NEGATIVE

## 2019-06-01 ENCOUNTER — Encounter: Payer: Medicaid Other | Admitting: Physical Therapy

## 2019-06-13 ENCOUNTER — Ambulatory Visit: Payer: Medicaid Other | Admitting: Physician Assistant

## 2019-06-14 ENCOUNTER — Ambulatory Visit: Payer: Medicaid Other | Admitting: Family Medicine

## 2019-06-16 ENCOUNTER — Ambulatory Visit: Payer: Medicaid Other | Admitting: Orthopedic Surgery

## 2019-06-20 NOTE — Progress Notes (Signed)
PATIENT: Amy Lester DOB: June 21, 1971  REASON FOR VISIT: follow up HISTORY FROM: patient  HISTORY OF PRESENT ILLNESS: Today 06/21/19  Amy Lester is a 48 year old female with history of multiple sclerosis.  She indicates she was diagnosed with MS 9 years ago at [place].  She was initially evaluated at this office by Dr. Anne Hahn in June 2020.  No medical records from her work-up for management at [facility] were provided. She reports chronic issues with numbness involving the left arm and leg.  She has pain throughout her body.  She has sleep apnea, using CPAP.  She also has significant migraine headaches.  She has tried Aimovig, Topamax, gabapentin, metoprolol, and Zonegran, Botox for headaches. She was not approved for Botox, and the expense was too high.  She was started on Seroquel.  Her JCV antibody index was negative 0.22. She had MRI of the brain in July 2020 that showed nonspecific white matter changes, but she has a extensive history of severe migraine headache which could also explain the white matter changes.  MRI of the cervical spine shows moderate spinal stenosis at C6-7 level, without injury to the spinal cord.  Based on MRI findings, the diagnosis of MS is in question. She remains on Topamax for headaches, and was unable to tolerate Toradol.  She is pending a lumbar puncture to look for oligoclonal banding to help confirm the diagnosis of MS. She indicates on average she is having 25 days a month of headache.  She has not found anything to be beneficial.  She is taking Maxalt, but it does not completely relieve the headache.  She was unable to tolerate Toradol due to stomach upset.  She says over the last few months she had 2 falls while outside.  Her lumbar puncture was scheduled, but she canceled it.  She denies any changes to her bowels or bladder.  She reports over time she feels her vision is not as sharp.  She moved to [place] about 8 months ago from [place] with her husband. She lives in  [place].  She has 3 grown children.  She is not currently employed.  She is able to operate a motor vehicle, but she does not do so often.  She presents today for follow-up unaccompanied.  HISTORY 02/16/2019 Dr. Anne Hahn: Amy Lester is a 48 year old left-handed white female with a history of multiple sclerosis.  She claims an MS diagnosis was made 9 years ago at [facility], she was in the MS clinic there, apparently involved with MS trials of some sort.  The patient claims that she has had MRI evaluations of the brain and cervical spine, she has had lumbar puncture done.  She initially presented with gait instability, pain in the arms and legs and difficulty with dropping things.  The patient does not know of any of the medications that she has been treated with.  There are no medical records available to me regarding the MS work-up.  The patient has not had any visual loss or double vision, she reports some chronic issues with numbness involving the left arm and leg, the left arm and leg feels slightly weaker.  She has pain throughout the body including the arms and legs and thighs.  She has sleep apnea on CPAP.  She has significant obesity issues.  She reports some problems with urinary incontinence, there is ongoing problems with gait instability, she has not fallen recently.  The patient has also had significant problems with migraine headaches of the  last 4 to 5 years and is averaging 16-20 headache days a month.  The patient has been on Aimovig, Topamax, gabapentin, metoprolol, and Zonegran in the past for her headaches.  She claims that she has tried Botox in the past but only had an injection or 2 and did not continue.  The patient is taking large amount of triptan medications, she is alternating Frova, Imitrex, and Maxalt, taking these medications greater than 3 days a week.  The patient does not know of any particular activators for headache, she is not sleeping well at night.  She does have photophobia without  phonophobia with her headache and she does have some nausea and vomiting.  The headaches are in the frontal regions bilaterally and a spread to the occipital area.  She claims that her daughter also has migraine headache.  The patient does have glucose intolerance, her most recent hemoglobin A1c was 6.5.   REVIEW OF SYSTEMS: Out of a complete 14 system review of symptoms, the patient complains only of the following symptoms, and all other reviewed systems are negative.  Headache, Numbness, weakness, tremors  ALLERGIES: Allergies  Allergen Reactions   Percocet [Oxycodone-Acetaminophen] Nausea And Vomiting    HOME MEDICATIONS: Outpatient Medications Prior to Visit  Medication Sig Dispense Refill   albuterol (VENTOLIN HFA) 108 (90 Base) MCG/ACT inhaler Inhale 3 puffs into the lungs every 6 (six) hours as needed for wheezing or shortness of breath. 3 g 1   atorvastatin (LIPITOR) 10 MG tablet Take 1 tablet (10 mg total) by mouth daily. 90 tablet 1   cetirizine (ZYRTEC) 10 MG tablet Take 1 tablet (10 mg total) by mouth daily. 90 tablet 0   clotrimazole-betamethasone (LOTRISONE) cream Apply 1 application topically as needed. 45 g 2   estradiol (EVAMIST) 1.53 MG/SPRAY transdermal spray Place 3 sprays onto the skin daily.     glipiZIDE (GLUCOTROL) 5 MG tablet Take 1 tablet (5 mg total) by mouth daily before breakfast. 90 tablet 1   HYDROCODONE-ACETAMINOPHEN PO Take 1 tablet by mouth as needed.     levothyroxine (SYNTHROID) 200 MCG tablet Take 1 tablet (200 mcg total) by mouth daily before breakfast. 90 tablet 0   levothyroxine (SYNTHROID) 25 MCG tablet Take 1 tablet (25 mcg total) by mouth daily before breakfast. 90 tablet 0   liothyronine (CYTOMEL) 5 MCG tablet Take 5 mcg by mouth daily.     lisinopril (ZESTRIL) 10 MG tablet Take 1 tablet (10 mg total) by mouth daily. 90 tablet 0   metoprolol succinate (TOPROL-XL) 100 MG 24 hr tablet Take 1 tablet (100 mg total) by mouth daily. Take  with or immediately following a meal. 90 tablet 3   metroNIDAZOLE (FLAGYL) 500 MG tablet Take 1 tablet (500 mg total) by mouth 2 (two) times daily. 14 tablet 0   metroNIDAZOLE (METROGEL) 1 % gel Apply 1 application topically daily.     montelukast (SINGULAIR) 10 MG tablet Take 1 tablet (10 mg total) by mouth at bedtime. 30 tablet 5   Multiple Vitamins-Minerals (MULTIVITAMIN WITH MINERALS) tablet Take 1 tablet by mouth daily.     nystatin cream (MYCOSTATIN) Apply 1 application topically daily as needed for dry skin.     Olopatadine HCl (PAZEO) 0.7 % SOLN Apply to eye every morning.     omeprazole (PRILOSEC) 40 MG capsule Take 1 capsule (40 mg total) by mouth daily. 90 capsule 0   ondansetron (ZOFRAN) 8 MG tablet Take 8 mg by mouth as needed for nausea or vomiting.  Red Yeast Rice Extract (RED YEAST RICE PO) Take 1 tablet by mouth daily.      Riboflavin (VITAMIN B-2 PO) Take by mouth.     rizatriptan (MAXALT) 10 MG tablet Take 1 tablet (10 mg total) by mouth 3 (three) times daily as needed for migraine. May repeat in 2 hours if needed 27 tablet 1   topiramate (TOPAMAX) 25 MG tablet TAKE 1 Tablet BY MOUTH ONCE NIGHTLY FOR 1 WEEK, THEN TAKE 2 Tablets  BY MOUTH ONCE NIGHTLY FOR 1 WEEK, THEN TAKE 3 Tablets  BY MOUTH ONCE NIGHTLY 90 tablet 1   TRETINOIN EX Apply topically daily.     triamcinolone cream (KENALOG) 0.1 % Apply 1 application topically 2 (two) times daily as needed. 45 g 0   triamterene-hydrochlorothiazide (MAXZIDE-25) 37.5-25 MG tablet Take 1 tablet by mouth daily.     No facility-administered medications prior to visit.     PAST MEDICAL HISTORY: Past Medical History:  Diagnosis Date   Asthma    Blood transfusion without reported diagnosis    Chronic migraine    Common migraine with intractable migraine 02/16/2019   Diabetes mellitus without complication (HCC)    Endometriosis    Fatty liver    GERD (gastroesophageal reflux disease)    Hyperlipidemia      Hypertension    Hypothyroidism    Incontinence    Irregular heartbeat    Multiple sclerosis (HCC)    Palpitations    Sleep apnea     PAST SURGICAL HISTORY: Past Surgical History:  Procedure Laterality Date   ABDOMINAL HYSTERECTOMY     CESAREAN SECTION     x1   CHOLECYSTECTOMY     ENDOMETRIAL ABLATION     FOOT SURGERY Left 06/2018   foot reconstruction   REPLACEMENT TOTAL KNEE Left     FAMILY HISTORY: Family History  Problem Relation Age of Onset   Heart attack Mother    Stroke Mother    Cancer Mother        breast cancer   Hyperthyroidism Mother    Hyperlipidemia Mother    Cancer Father        lymph nodes, liver cancer   Other Daughter        Fine Gold Type II Syndrome and cognetive issues   Memory loss Daughter    Other Son        Fine Gold Syndrome and BPES    SOCIAL HISTORY: Social History   Socioeconomic History   Marital status: Married    Spouse name: Not on file   Number of children: Not on file   Years of education: 14   Highest education level: Not on file  Occupational History   Not on file  Social Needs   Financial resource strain: Not on file   Food insecurity    Worry: Not on file    Inability: Not on file   Transportation needs    Medical: Not on file    Non-medical: Not on file  Tobacco Use   Smoking status: Never Smoker   Smokeless tobacco: Never Used  Substance and Sexual Activity   Alcohol use: Yes    Frequency: Never    Comment: once a year   Drug use: Never   Sexual activity: Not on file  Lifestyle   Physical activity    Days per week: Not on file    Minutes per session: Not on file   Stress: Not on file  Relationships   Social connections  Talks on phone: Not on file    Gets together: Not on file    Attends religious service: Not on file    Active member of club or organization: Not on file    Attends meetings of clubs or organizations: Not on file    Relationship status:  Not on file   Intimate partner violence    Fear of current or ex partner: Not on file    Emotionally abused: Not on file    Physically abused: Not on file    Forced sexual activity: Not on file  Other Topics Concern   Not on file  Social History Narrative   Left handed    Caffeine  1 soda per day    Lives at home with husband and adult daughter    PHYSICAL EXAM  Vitals:   06/21/19 1121  BP: 139/89  Pulse: 73  Temp: 98.2 F (36.8 C)  TempSrc: Oral  Weight: 225 lb 3.2 oz (102.2 kg)  Height: 5\' 4"  (1.626 m)   Body mass index is 38.66 kg/m.  Generalized: Well developed, in no acute distress   Neurological examination  Mentation: Alert oriented to time, place, history taking. Follows all commands speech and language fluent Cranial nerve II-XII: Pupils were equal round reactive to light. Extraocular movements were full, visual field were full on confrontational test. Facial sensation and strength were normal. Head turning and shoulder shrug  were normal and symmetric. Motor: The motor testing reveals 5 over 5 strength of all 4 extremities. Good symmetric motor tone is noted throughout.  Sensory: Sensory testing is intact to soft touch on all 4 extremities. No evidence of extinction is noted.  Coordination: Cerebellar testing reveals good finger-nose-finger and heel-to-shin bilaterally.  Gait and station: Gait is mildly antalgic on the left. Tandem gait is normal. Romberg is negative. No drift is seen.  Reflexes: Deep tendon reflexes are symmetric and normal bilaterally.   DIAGNOSTIC DATA (LABS, IMAGING, TESTING) - I reviewed patient records, labs, notes, testing and imaging myself where available.  Lab Results  Component Value Date   WBC 7.1 03/24/2019   HGB 13.1 03/24/2019   HCT 40.7 03/24/2019   MCV 91.3 03/24/2019   PLT 315 03/24/2019      Component Value Date/Time   NA 137 03/24/2019 1123   K 3.9 03/24/2019 1123   CL 105 03/24/2019 1123   CO2 22 03/24/2019 1123    GLUCOSE 141 (H) 03/24/2019 1123   BUN 15 03/24/2019 1123   CREATININE 0.78 03/24/2019 1123   CALCIUM 8.9 03/24/2019 1123   PROT 8.0 03/24/2019 1123   ALBUMIN 4.0 03/24/2019 1123   AST 30 03/24/2019 1123   ALT 33 03/24/2019 1123   ALKPHOS 107 03/24/2019 1123   BILITOT 0.4 03/24/2019 1123   GFRNONAA >60 03/24/2019 1123   GFRAA >60 03/24/2019 1123   Lab Results  Component Value Date   CHOL 246 (H) 02/24/2019   HDL 45 02/24/2019   LDLCALC 163 (H) 02/24/2019   TRIG 188 (H) 02/24/2019   CHOLHDL 5.5 02/24/2019   Lab Results  Component Value Date   HGBA1C 6.4 (A) 04/12/2019   No results found for: VITAMINB12 Lab Results  Component Value Date   TSH 1.734 03/24/2019    ASSESSMENT AND PLAN 48 y.o. year old female  has a past medical history of Asthma, Blood transfusion without reported diagnosis, Chronic migraine, Common migraine with intractable migraine (02/16/2019), Diabetes mellitus without complication (HCC), Endometriosis, Fatty liver, GERD (gastroesophageal reflux disease), Hyperlipidemia,  Hypertension, Hypothyroidism, Incontinence, Irregular heartbeat, Multiple sclerosis (HCC), Palpitations, and Sleep apnea. here with:  1.  Multiple sclerosis 2.  Intractable migraine headaches  The diagnosis of MS is in question.  MRI of the brain in July 2020 showed nonspecific white matter changes, could be explained by longstanding history of severe migraine headaches.  Lumbar puncture was ordered to confirm diagnosis, but the patient canceled the procedure.  We will re-fax the order and she is agreeable to have LP completed.  I will have her complete the application for [employer] safety net foundation.  Once completed, I will fax to the drug company to try and have Aimovig approved for migraine prevention. I would like to have her take 70 mg every 2 weeks. She has been on a multitude of medications in the past for headachces including gabapentin, Topamax, Seroquel, Aimovig, metoprolol, Botox, and  Zonegran. She complains of 25 days a month of migraine headache. She may continue taking Maxalt as needed. She is also going to try and get her records sent from [facility] where she was diagnosed with MS. She was able to speak with our patient assistance representative today, in regards to [facility].  She will return in 4 months or sooner if needed.  I did advise her symptoms worsen or she develops any new symptoms she should let us know.  I spent 25 minutes with the patient. 50% of this time was spent discussing her plan of care.  Margie Ege, AGNP-C, DNP 06/21/2019, 11:22 AM [facility] [address], [address] [place], [place] [address] [phone]

## 2019-06-21 ENCOUNTER — Other Ambulatory Visit: Payer: Self-pay | Admitting: Neurology

## 2019-06-21 ENCOUNTER — Encounter: Payer: Self-pay | Admitting: Neurology

## 2019-06-21 ENCOUNTER — Other Ambulatory Visit: Payer: Self-pay

## 2019-06-21 ENCOUNTER — Ambulatory Visit (INDEPENDENT_AMBULATORY_CARE_PROVIDER_SITE_OTHER): Payer: Self-pay | Admitting: Neurology

## 2019-06-21 VITALS — BP 139/89 | HR 73 | Temp 98.2°F | Ht 64.0 in | Wt 225.2 lb

## 2019-06-21 DIAGNOSIS — R9089 Other abnormal findings on diagnostic imaging of central nervous system: Secondary | ICD-10-CM

## 2019-06-21 DIAGNOSIS — G35 Multiple sclerosis: Secondary | ICD-10-CM

## 2019-06-21 DIAGNOSIS — R202 Paresthesia of skin: Secondary | ICD-10-CM

## 2019-06-21 DIAGNOSIS — G43719 Chronic migraine without aura, intractable, without status migrainosus: Secondary | ICD-10-CM

## 2019-06-21 MED ORDER — TOPIRAMATE 25 MG PO TABS: ORAL_TABLET | ORAL | 1 refills | Status: DC

## 2019-06-21 MED ORDER — RIZATRIPTAN BENZOATE 10 MG PO TABS: 10.0000 mg | ORAL_TABLET | Freq: Three times a day (TID) | ORAL | 1 refills | Status: DC | PRN

## 2019-06-21 NOTE — Progress Notes (Signed)
I have read the note, and I agree with the clinical assessment and plan.  Brandon Scarbrough K Olivier Frayre   

## 2019-06-21 NOTE — Patient Instructions (Signed)
1. Fill out [employer] to try and get coverage for Aimovig  2. Continue Topamax, Maxalt 3. Please get Lumbar Puncture 4. Have your records sent over from the [place]

## 2019-06-22 ENCOUNTER — Telehealth: Payer: Self-pay

## 2019-06-22 NOTE — Telephone Encounter (Signed)
Please try to contact patient again to arrange for ROV to discuss sleep study results.

## 2019-06-22 NOTE — Telephone Encounter (Signed)
[employer] paperwork has been filled out and signed by Butler Denmark, NP. Paperwork has been faxed to [employer]. Confirmation fax has been received.

## 2019-06-22 NOTE — Telephone Encounter (Signed)
Spoke with patient.  She states she is in [place] and would rather I call her tomorrow morning to schedule appt with Dr. Halford Chessman.

## 2019-06-23 ENCOUNTER — Ambulatory Visit (INDEPENDENT_AMBULATORY_CARE_PROVIDER_SITE_OTHER): Payer: Self-pay | Admitting: Pulmonary Disease

## 2019-06-23 ENCOUNTER — Encounter: Payer: Self-pay | Admitting: Pulmonary Disease

## 2019-06-23 ENCOUNTER — Other Ambulatory Visit: Payer: Self-pay

## 2019-06-23 VITALS — BP 132/78 | HR 78 | Temp 98.0°F | Ht 64.0 in | Wt 224.0 lb

## 2019-06-23 DIAGNOSIS — Z789 Other specified health status: Secondary | ICD-10-CM

## 2019-06-23 DIAGNOSIS — E669 Obesity, unspecified: Secondary | ICD-10-CM

## 2019-06-23 DIAGNOSIS — G4730 Sleep apnea, unspecified: Secondary | ICD-10-CM

## 2019-06-23 DIAGNOSIS — J4530 Mild persistent asthma, uncomplicated: Secondary | ICD-10-CM

## 2019-06-23 DIAGNOSIS — G4733 Obstructive sleep apnea (adult) (pediatric): Secondary | ICD-10-CM

## 2019-06-23 NOTE — Patient Instructions (Signed)
Will arrange for referral to ENT surgeon and to dentist to discuss alternative therapies to CPAP for obstructive sleep apnea.

## 2019-06-23 NOTE — Progress Notes (Signed)
[facility]  Chief Complaint  Patient presents with  . Sleep Apnea    Discuss sleep study results.    Constitutional:  BP 132/78 (BP Location: Right Arm, Patient Position: Sitting, Cuff Size: Normal)   Pulse 78   Temp 98 F (36.7 C)   Ht 5\' 4"  (1.626 m)   Wt 224 lb (101.6 kg)   SpO2 100% Comment: on room air  BMI 38.45 kg/m   Past Medical History:  Multiple sclerosis, Hypothyroidism, HTN, HLD, GERD, Fatty liver, Endometriosis, DM, Migraine HA, Asthma  Brief Summary:  Amy Lester is a 48 y.o. female with obstructive sleep apnea.  She had home sleep study.  Showed severe sleep apnea.  She still has CPAP machine.  Needs new mask.  No DME since she lacks insurance.  She has [facility] discount plan.  Never heard about ENT referral.  Wants to see dentist about oral appliance.  Breathing okay.  Not having cough, wheeze or chest congestion.  Physical Exam:   Appearance - well kempt   ENMT - no sinus tenderness, no nasal discharge, no oral exudate  Neck - no masses, trachea midline, no thyromegaly, no elevation in JVP  Respiratory - normal appearance of chest wall, normal respiratory effort w/o accessory muscle use, no dullness on percussion, no wheezing or rales  CV - s1s2 regular rate and rhythm, no murmurs, no peripheral edema, radial pulses symmetric  GI - soft, non tender  Lymph - no adenopathy noted in neck and axillary areas  MSK - normal gait  Ext - no cyanosis, clubbing, or joint inflammation noted  Skin - no rashes, lesions, or ulcers  Neuro - normal strength, oriented x 3  Psych - normal mood and affect   Discussion:  She has history of obstructive sleep apnea.  She seems well maintained on CPAP, but she does not want to continue CPAP.  She wants to explore alternative options to CPAP.  Explained that CPAP is best option given severity of her sleep apnea.  Also explained that her insurance limitations will likely be a  significant barrier to her being able to get referral to ENT or dentistry.  She would still like to explore these options.  Assessment/Plan:   Obstructive sleep apnea. - continue CPAP for now - she will look into options for financial assistance if she needs to continue CPAP - will arrange for referral to ENT and dentistry so she can review the options as alternative to CPAP  Allergic asthma. - prn singulair, zyrtec, albuterol  Obesity. - reviewed options to assist with weight loss  Migraine headache, multiple sclerosis. - followed by Dr. 54 with [facility]   Patient Instructions  Will arrange for referral to ENT surgeon and to dentist to discuss alternative therapies to CPAP for obstructive sleep apnea.    [facility], MD [facility] Pager: [phone] 06/23/2019, 4:06 PM  Flow Sheet    Sleep tests:  CPAP 02/03/19 to 05/03/19 >> used on 90 of 90 nights with average 10 hrs 31 min.  Average AHI 0.4 with median CPAP 16 and 95 th percentile CPAP 17 cm H2O HST 05/18/19 >> AHI 46.5, SpO2 low 89%  Medications:   Allergies as of 06/23/2019      Reactions   Percocet [oxycodone-acetaminophen] Nausea And Vomiting      Medication List       Accurate as of June 23, 2019  4:06 PM. If you have any questions, ask your  nurse or doctor.        albuterol 108 (90 Base) MCG/ACT inhaler Commonly known as: VENTOLIN HFA Inhale 3 puffs into the lungs every 6 (six) hours as needed for wheezing or shortness of breath.   atorvastatin 10 MG tablet Commonly known as: LIPITOR Take 1 tablet (10 mg total) by mouth daily.   cetirizine 10 MG tablet Commonly known as: ZYRTEC Take 1 tablet (10 mg total) by mouth daily.   clotrimazole-betamethasone cream Commonly known as: Lotrisone Apply 1 application topically as needed.   estradiol 1.53 MG/SPRAY transdermal spray Commonly known as: EVAMIST Place 3 sprays onto the skin daily.   glipiZIDE 5 MG tablet  Commonly known as: GLUCOTROL Take 1 tablet (5 mg total) by mouth daily before breakfast.   HYDROCODONE-ACETAMINOPHEN PO Take 1 tablet by mouth as needed.   levothyroxine 25 MCG tablet Commonly known as: SYNTHROID Take 1 tablet (25 mcg total) by mouth daily before breakfast.   levothyroxine 200 MCG tablet Commonly known as: Synthroid Take 1 tablet (200 mcg total) by mouth daily before breakfast.   liothyronine 5 MCG tablet Commonly known as: CYTOMEL Take 5 mcg by mouth daily.   lisinopril 10 MG tablet Commonly known as: ZESTRIL Take 1 tablet (10 mg total) by mouth daily.   metoprolol succinate 100 MG 24 hr tablet Commonly known as: TOPROL-XL Take 1 tablet (100 mg total) by mouth daily. Take with or immediately following a meal.   metroNIDAZOLE 1 % gel Commonly known as: METROGEL Apply 1 application topically daily.   metroNIDAZOLE 500 MG tablet Commonly known as: FLAGYL Take 1 tablet (500 mg total) by mouth 2 (two) times daily.   montelukast 10 MG tablet Commonly known as: SINGULAIR Take 1 tablet (10 mg total) by mouth at bedtime.   multivitamin with minerals tablet Take 1 tablet by mouth daily.   nystatin cream Commonly known as: MYCOSTATIN Apply 1 application topically daily as needed for dry skin.   omeprazole 40 MG capsule Commonly known as: PRILOSEC Take 1 capsule (40 mg total) by mouth daily.   ondansetron 8 MG tablet Commonly known as: ZOFRAN Take 8 mg by mouth as needed for nausea or vomiting.   Pazeo 0.7 % Soln Generic drug: Olopatadine HCl Apply to eye every morning.   RED YEAST RICE PO Take 1 tablet by mouth daily.   rizatriptan 10 MG tablet Commonly known as: Maxalt Take 1 tablet (10 mg total) by mouth 3 (three) times daily as needed for migraine. May repeat in 2 hours if needed   topiramate 25 MG tablet Commonly known as: TOPAMAX Take 2 at bedtime   TRETINOIN EX Apply topically daily.   triamcinolone cream 0.1 % Commonly known as:  KENALOG Apply 1 application topically 2 (two) times daily as needed.   triamterene-hydrochlorothiazide 37.5-25 MG tablet Commonly known as: MAXZIDE-25 Take 1 tablet by mouth daily.   VITAMIN B-2 PO Take by mouth.       Past Surgical History:  She  has a past surgical history that includes Abdominal hysterectomy; Cholecystectomy; Foot surgery (Left, 06/2018); Cesarean section; Endometrial ablation; and Replacement total knee (Left).  Family History:  Her family history includes Cancer in her father and mother; Heart attack in her mother; Hyperlipidemia in her mother; Hyperthyroidism in her mother; Memory loss in her daughter; Other in her daughter and son; Stroke in her mother.  Social History:  She  reports that she has never smoked. She has never used smokeless tobacco. She reports current alcohol  use. She reports that she does not use drugs.

## 2019-06-23 NOTE — Telephone Encounter (Signed)
Pt coming 10/16 at @ 3:15 for appt.

## 2019-06-30 ENCOUNTER — Ambulatory Visit: Payer: Medicaid Other | Admitting: Orthopedic Surgery

## 2019-07-14 ENCOUNTER — Ambulatory Visit: Payer: Medicaid Other | Admitting: Orthopedic Surgery

## 2019-07-18 ENCOUNTER — Telehealth: Payer: Self-pay | Admitting: Neurology

## 2019-07-18 DIAGNOSIS — R9089 Other abnormal findings on diagnostic imaging of central nervous system: Secondary | ICD-10-CM

## 2019-07-18 NOTE — Telephone Encounter (Signed)
The order for lumbar puncture has been placed, we will get standard protein, glucose, cells, and oligoclonal banding, do not need anything else.

## 2019-07-18 NOTE — Addendum Note (Signed)
Addended by: Kathrynn Ducking on: 07/18/2019 11:36 AM   Modules accepted: Orders

## 2019-07-18 NOTE — Telephone Encounter (Signed)
Dr. Jannifer Franklin please look at [facility] 1970/10/22  you ordered a LP are you sure you not wanting more labs ? Please advise

## 2019-07-19 ENCOUNTER — Telehealth: Payer: Self-pay

## 2019-07-19 NOTE — Telephone Encounter (Signed)
Called pt and informed her that the doctor here cannot prescribe anything for her until she is seen first as a new pt. Pt then verbalized understanding of this and questioned if she could be seen earlier than July. If you have an opening before July, are you okay with moving her appt to an earlier date?

## 2019-07-19 NOTE — Telephone Encounter (Signed)
We can move her appointment to an earlier date.  Also if she can be seen by any other doctor in the group for her hypothyroidism would be fine

## 2019-07-19 NOTE — Telephone Encounter (Signed)
New patient and has no medication just moved here wants to know what she can do or will we help her with getting medication until her visit of next year July    Please advise

## 2019-07-20 NOTE — Telephone Encounter (Signed)
Pt was scheduled for sooner appt

## 2019-07-21 ENCOUNTER — Inpatient Hospital Stay
Admission: RE | Admit: 2019-07-21 | Discharge: 2019-07-21 | Disposition: A | Discharge status: Home / Self Care | Payer: Medicaid Other | Source: Ambulatory Visit | Attending: Neurology | Admitting: Neurology

## 2019-07-21 NOTE — Discharge Instructions (Signed)

## 2019-07-24 ENCOUNTER — Other Ambulatory Visit: Payer: Self-pay

## 2019-07-24 ENCOUNTER — Ambulatory Visit (INDEPENDENT_AMBULATORY_CARE_PROVIDER_SITE_OTHER): Payer: Self-pay | Admitting: Endocrinology

## 2019-07-24 ENCOUNTER — Telehealth: Payer: Self-pay | Admitting: Family Medicine

## 2019-07-24 ENCOUNTER — Encounter: Payer: Self-pay | Admitting: Endocrinology

## 2019-07-24 VITALS — BP 136/80 | HR 78 | Ht 64.0 in | Wt 227.8 lb

## 2019-07-24 DIAGNOSIS — E1169 Type 2 diabetes mellitus with other specified complication: Secondary | ICD-10-CM

## 2019-07-24 DIAGNOSIS — E063 Autoimmune thyroiditis: Secondary | ICD-10-CM

## 2019-07-24 DIAGNOSIS — E782 Mixed hyperlipidemia: Secondary | ICD-10-CM

## 2019-07-24 DIAGNOSIS — E669 Obesity, unspecified: Secondary | ICD-10-CM

## 2019-07-24 LAB — COMPREHENSIVE METABOLIC PANEL
ALT: 48 U/L — ABNORMAL HIGH (ref 0–35)
AST: 34 U/L (ref 0–37)
Albumin: 4.2 g/dL (ref 3.5–5.2)
Alkaline Phosphatase: 118 U/L — ABNORMAL HIGH (ref 39–117)
BUN: 14 mg/dL (ref 6–23)
CO2: 23 mEq/L (ref 19–32)
Calcium: 9.6 mg/dL (ref 8.4–10.5)
Chloride: 104 mEq/L (ref 96–112)
Creatinine, Ser: 0.69 mg/dL (ref 0.40–1.20)
GFR: 90.03 mL/min (ref >60.00)
Glucose, Bld: 130 mg/dL — ABNORMAL HIGH (ref 70–99)
Potassium: 3.9 mEq/L (ref 3.5–5.1)
Sodium: 138 mEq/L (ref 135–145)
Total Bilirubin: 0.4 mg/dL (ref 0.2–1.2)
Total Protein: 8 g/dL (ref 6.0–8.3)

## 2019-07-24 LAB — MICROALBUMIN / CREATININE URINE RATIO
Creatinine,U: 109.9 mg/dL
Microalb Creat Ratio: 0.8 mg/g (ref 0.0–30.0)
Microalb, Ur: 0.9 mg/dL (ref 0.0–1.9)

## 2019-07-24 LAB — T3, FREE: T3, Free: 4.1 pg/mL (ref 2.3–4.2)

## 2019-07-24 LAB — T4, FREE: Free T4: 1.04 ng/dL (ref 0.60–1.60)

## 2019-07-24 LAB — HEMOGLOBIN A1C: Hgb A1c MFr Bld: 7.8 % — ABNORMAL HIGH (ref 4.6–6.5)

## 2019-07-24 LAB — TSH: TSH: 0.81 u[IU]/mL (ref 0.35–4.50)

## 2019-07-24 NOTE — Telephone Encounter (Signed)
Pt came in the office with her spouse and was trying to reschedule her appointment that she had to cancel.  [place] and would like to see if you would be able to see her in the office on 12/2 or 12/16 at 1:00 or 1:30.  It would be one of your virtual spots.  Can she be rescheduled for a in office on one of those days?

## 2019-07-24 NOTE — Progress Notes (Signed)
Patient ID: Amy Lester, female   DOB: 02-17-1971, 48 y.o.   MRN: [id number]           Reason for Appointment: Consultation for Type 2 Diabetes  Referring PCP: Abbe AmsterdamShannon Banks   History of Present Illness:          Date of diagnosis of type 2 diabetes mellitus:   2018      Background history:   She apparently had been told to have prediabetes prior to diagnosis of diabetes.  However she did not have gestational diabetes with her 3 pregnancies She remembers her A1c being 9% at the time of diagnosis but records are not available She was tried on Metformin but this caused abdominal discomfort and she did not take this and was not given any other treatment She thinks her blood sugars improved with better diet  Recent history:    Most recent A1c is 6.4 done on 04/12/2019        Non-insulin hypoglycemic drugs the patient is taking are: None  Current management, blood sugar patterns and problems identified:  She has not checked her sugars recently because of discomfort of her fingertips  She was told to start glipizide in 02/2019 because of intolerance to Metformin but she did not see any benefit with her blood sugars on this and she stopped it after a month or 2.  At that time her glucose was 149 and A1c 6.5  She started improving her diet with cutting back on foods like pasta and reducing regular soft drinks  With this her blood sugars at home which were previously about 180-190 had been coming down to close to 120   No recent A1c available        Side effects from medications have been: Abdominal distress from Metformin              Exercise:  Limited by left ankle pain  Glucose monitoring:  done 0-1 times a day         Glucometer:  Generic    Blood Glucose readings by recall recently    PREMEAL Breakfast Lunch Dinner Bedtime  Overall   Glucose range: 120   130   Median:         Dietician visit, most recent: Never  Weight history:  Wt Readings from Last 3  Encounters:  07/24/19 227 lb 12.8 oz (103.3 kg)  06/23/19 224 lb (101.6 kg)  06/21/19 225 lb 3.2 oz (102.2 kg)    Glycemic control:   Lab Results  Component Value Date   HGBA1C 6.4 (A) 04/12/2019   HGBA1C 6.5 (H) 02/24/2019   HGBA1C 6.5 (H) 11/25/2018   Lab Results  Component Value Date   LDLCALC 163 (H) 02/24/2019   CREATININE 0.78 03/24/2019   No results found for: MICRALBCREAT  No results found for: FRUCTOSAMINE  HYPOTHYROIDISM: See review of systems    No visits with results within 1 Week(s) from this visit.  Latest known visit with results is:  Orders Only on 05/28/2019  Component Date Value Ref Range Status  . Chlamydia 05/28/2019 Negative   Final   Normal Reference Range - Negative  . Neisseria Gonorrhea 05/28/2019 Negative   Final   Normal Reference Range - Negative    Allergies as of 07/24/2019      Reactions   Percocet [oxycodone-acetaminophen] Nausea And Vomiting      Medication List       Accurate as of July 24, 2019 12:36 PM. If you  have any questions, ask your nurse or doctor.        STOP taking these medications   glipiZIDE 5 MG tablet Commonly known as: GLUCOTROL Stopped by: Reather Littler, MD     TAKE these medications   albuterol 108 (90 Base) MCG/ACT inhaler Commonly known as: VENTOLIN HFA Inhale 3 puffs into the lungs every 6 (six) hours as needed for wheezing or shortness of breath.   atorvastatin 10 MG tablet Commonly known as: LIPITOR Take 1 tablet (10 mg total) by mouth daily.   cetirizine 10 MG tablet Commonly known as: ZYRTEC Take 1 tablet (10 mg total) by mouth daily.   clotrimazole-betamethasone cream Commonly known as: Lotrisone Apply 1 application topically as needed.   estradiol 1.53 MG/SPRAY transdermal spray Commonly known as: EVAMIST Place 3 sprays onto the skin daily.   HYDROCODONE-ACETAMINOPHEN PO Take 1 tablet by mouth as needed.   levothyroxine 25 MCG tablet Commonly known as: SYNTHROID Take 1  tablet (25 mcg total) by mouth daily before breakfast.   levothyroxine 200 MCG tablet Commonly known as: Synthroid Take 1 tablet (200 mcg total) by mouth daily before breakfast.   liothyronine 5 MCG tablet Commonly known as: CYTOMEL Take 5 mcg by mouth daily.   lisinopril 10 MG tablet Commonly known as: ZESTRIL Take 1 tablet (10 mg total) by mouth daily.   metoprolol succinate 100 MG 24 hr tablet Commonly known as: TOPROL-XL Take 1 tablet (100 mg total) by mouth daily. Take with or immediately following a meal.   metroNIDAZOLE 1 % gel Commonly known as: METROGEL Apply 1 application topically daily.   metroNIDAZOLE 500 MG tablet Commonly known as: FLAGYL Take 1 tablet (500 mg total) by mouth 2 (two) times daily.   montelukast 10 MG tablet Commonly known as: SINGULAIR Take 1 tablet (10 mg total) by mouth at bedtime.   multivitamin with minerals tablet Take 1 tablet by mouth daily.   nystatin cream Commonly known as: MYCOSTATIN Apply 1 application topically daily as needed for dry skin.   omeprazole 40 MG capsule Commonly known as: PRILOSEC Take 1 capsule (40 mg total) by mouth daily.   ondansetron 8 MG tablet Commonly known as: ZOFRAN Take 8 mg by mouth as needed for nausea or vomiting.   Pazeo 0.7 % Soln Generic drug: Olopatadine HCl Apply to eye every morning.   RED YEAST RICE PO Take 1 tablet by mouth daily.   rizatriptan 10 MG tablet Commonly known as: Maxalt Take 1 tablet (10 mg total) by mouth 3 (three) times daily as needed for migraine. May repeat in 2 hours if needed   topiramate 25 MG tablet Commonly known as: TOPAMAX Take 2 at bedtime What changed:   how much to take  how to take this  when to take this  additional instructions   TRETINOIN EX Apply topically daily.   triamcinolone cream 0.1 % Commonly known as: KENALOG Apply 1 application topically 2 (two) times daily as needed.   triamterene-hydrochlorothiazide 37.5-25 MG tablet  Commonly known as: MAXZIDE-25 Take 1 tablet by mouth daily.   VITAMIN B-2 PO Take by mouth.       Allergies:  Allergies  Allergen Reactions  . Percocet [Oxycodone-Acetaminophen] Nausea And Vomiting    Past Medical History:  Diagnosis Date  . Asthma   . Blood transfusion without reported diagnosis   . Chronic migraine   . Common migraine with intractable migraine 02/16/2019  . Diabetes mellitus without complication (HCC)   . Endometriosis   .  Fatty liver   . GERD (gastroesophageal reflux disease)   . Hyperlipidemia   . Hypertension   . Hypothyroidism   . Incontinence   . Irregular heartbeat   . Multiple sclerosis (HCC)   . Palpitations   . Sleep apnea     Past Surgical History:  Procedure Laterality Date  . ABDOMINAL HYSTERECTOMY    . CESAREAN SECTION     x1  . CHOLECYSTECTOMY    . ENDOMETRIAL ABLATION    . FOOT SURGERY Left 06/2018   foot reconstruction  . REPLACEMENT TOTAL KNEE Left     Family History  Problem Relation Age of Onset  . Heart attack Mother   . Stroke Mother   . Cancer Mother        breast cancer  . Hyperlipidemia Mother   . Hypothyroidism Mother   . Cancer Father        lymph nodes, liver cancer  . Other Daughter        Fine Gold Type II Syndrome and cognetive issues  . Memory loss Daughter   . Other Son        Fine Gold Syndrome and BPES  . Diabetes Son 22       IDDM  . Hypothyroidism Sister   . Hyperthyroidism Neg Hx     Social History:  reports that she has never smoked. She has never used smokeless tobacco. She reports current alcohol use. She reports that she does not use drugs.   Review of Systems  Constitutional: Negative for weight loss.  HENT: Positive for headaches.   Respiratory:       Has intermittent asthma.  Also on treatment for sleep apnea with CPAP  Cardiovascular: Negative for leg swelling.  Gastrointestinal: Negative for constipation.       On treatment for GERD  Endocrine: Negative for fatigue.   Genitourinary: Negative for nocturia.  Skin:       She thinks she gets some rash on her face from her CPAP mask  Neurological: Positive for tingling. Negative for numbness.       Some tingling present in her toes.  No pain    HYPOTHYROIDISM: She initially had symptoms of fatigue, hair loss and difficulties with weight when she was a teenager She also has a strong family history of hypothyroidism She likely has been on thyroid supplements for about 25 years or more.  Fairly consistently  Although she has been on Synthroid mostly about 2 years ago an outside physician told her to take liothyronine also that she did not know why and she did not feel any better with starting this combination  She has been on the same doses of levothyroxine 225 mcg and Cytomel 5 mcg for over 2 years now Her TSH was high in June from her not taking her medication regularly  Recently has not had any unusual fatigue or hair loss  Lab Results  Component Value Date   TSH 1.734 03/24/2019   TSH 9.694 (H) 02/24/2019   TSH 3.435 11/25/2018   FREET4 0.73 02/24/2019   FREET4 0.62 (L) 11/25/2018    Lipid history: She was started on Lipitor 10 mg by her PCP but she thinks it caused some upper body muscle aches She was also told previously to start on red rice yeast then she continued this also    Lab Results  Component Value Date   CHOL 246 (H) 02/24/2019   HDL 45 02/24/2019   LDLCALC 163 (H) 02/24/2019   TRIG  188 (H) 02/24/2019   CHOLHDL 5.5 02/24/2019           Hypertension: Has been present since age 53 Treated with lisinopril, metoprolol and Maxzide   BP Readings from Last 3 Encounters:  07/24/19 136/80  06/23/19 132/78  06/21/19 139/89  .  Lab Results  Component Value Date   K 3.9 03/24/2019     Most recent foot exam: 11/20  Currently known complications of diabetes:?  Neuropathy  LABS:  No visits with results within 1 Week(s) from this visit.  Latest known visit with results is:   Orders Only on 05/28/2019  Component Date Value Ref Range Status  . Chlamydia 05/28/2019 Negative   Final   Normal Reference Range - Negative  . Neisseria Gonorrhea 05/28/2019 Negative   Final   Normal Reference Range - Negative    Physical Examination:  BP 136/80 (BP Location: Left Arm, Patient Position: Sitting, Cuff Size: Large)   Pulse 78   Ht 5\' 4"  (1.626 m)   Wt 227 lb 12.8 oz (103.3 kg)   SpO2 98%   BMI 39.10 kg/m   GENERAL:         Patient has generalized obesity.    HEENT:         Eye exam shows normal external appearance.  Fundus exam deferred No retinopathy.  Oral exam not indicated   NECK:   There is no lymphadenopathy  Thyroid is not enlarged and no nodules felt.   Carotids are normal to palpation and no bruit heard  LUNGS:         Chest is symmetrical. Lungs are clear to auscultation.   HEART:         Heart sounds:  S1 and S2 are normal. No murmur or click heard., no S3 or S4.    ABDOMEN:   There is no distention present. Liver and spleen are not palpable.  No other mass or tenderness present.    NEUROLOGICAL:   Diabetic Foot Exam - Simple   Simple Foot Form Diabetic Foot exam was performed with the following findings: Yes   Visual Inspection No deformities, no ulcerations, no other skin breakdown bilaterally: Yes Sensation Testing Intact to touch and monofilament testing bilaterally: Yes See comments: Yes Pulse Check Posterior Tibialis and Dorsalis pulse intact bilaterally: Yes Comments Has decreased monofilament sensation distally on the toes with inconsistent response            Vibration sense is moderately reduced in distal first toes, slightly worse on the left.  MUSCULOSKELETAL:  There is no swelling or deformity of the peripheral joints.     EXTREMITIES:     There is no ankle edema.   SKIN:       No rash or lesions of concern.        ASSESSMENT:  Diabetes type 2  See history of present illness for detailed discussion of current  diabetes management, blood sugar patterns and problems identified  Last A1c indicates fair control with A1c 6.4  Current treatment regimen is none She has been intolerant to Metformin Also may have difficulty affording brand-name medications since she does not have any prescription drug insurance Reportedly her blood sugars are fairly well controlled at home but this could not be validated today  Complications of diabetes: Possible neuropathy, has some objective signs    PLAN:    1. Glucose monitoring: . Patient advised to check readings either fasting or 2 hours after meals  2.  Diabetes education: .  Patient will need nutritional counseling but currently cannot afford it . Given handout on high fat foods to avoid or reduce  3.  Lifestyle changes: . Dietary changes: She needs to cut down on high fat foods and large portions of starchy meals . Exercise regimen: Encouraged her to be walking regularly for exercise  4.  Medication regimen . This will be decided based on her glucose and A1c . Would benefit from an SGLT2 drug with multiple benefits including weight loss and possibly avoiding need for diuretic antihypertensive although . For her hypothyroidism labs will be done today and her dose is decided based on the results . She will likely not need to take red yeast rice if she is taking atorvastatin  5.  Preventive care needed:  . Annual eye exams . Follow-up lipid panel with PCP to adjust her medication   6.  Follow-up: To be determined    There are no Patient Instructions on file for this visit.   Consultation note has been sent to the referring physician  Elayne Snare 07/24/2019, 12:36 PM   Note: This office note was prepared with Dragon voice recognition system technology. Any transcriptional errors that result from this process are unintentional.

## 2019-07-25 ENCOUNTER — Other Ambulatory Visit: Payer: Self-pay

## 2019-07-25 ENCOUNTER — Telehealth: Payer: Self-pay

## 2019-07-25 MED ORDER — LEVOTHYROXINE SODIUM 125 MCG PO TABS: ORAL_TABLET | ORAL | 1 refills | Status: DC

## 2019-07-25 NOTE — Telephone Encounter (Signed)
Called pt to review lab results and to advise patient about Dr. Ronnie Derby recommendations below. Patient refused to take [place] because pt's husband took this years ago and the doctor at the time took said husband off of it because there were reportedly lawsuits against the company making the product. Pt was assured that Dr. Dwyane Dee would not prescribe something that was knowingly hazardous to the health of a patient, but she insists that she would like Dr. Dwyane Dee to suggest an alternative.  Pt did verbalize acceptance and understanding that she should stop taking Cytomel and that her new Synthroid dose would be 187mcg and that she would take 2 tablets by mouth daily in the morning. A new prescription was sent to the [facility] pharmacy that patient has listed on file.

## 2019-07-25 NOTE — Telephone Encounter (Signed)
She can try Jardiance 10 mg instead but if her patient assistance program can only provide Invokana she will have to take it because it is proven safe

## 2019-07-25 NOTE — Telephone Encounter (Signed)
-----   Message from Elayne Snare, MD sent at 07/24/2019  8:59 PM EST ----- A1c is higher at 7.8.  Will need to send prescription to her medication management program for Invokana 100 mg daily which allows the kidneys to eliminate glucose.  When this is available can reduce triamterene HCT to half tablet.  Need to mail information pamphlet on Invokana. Thyroid level indicates high normal T3 level from Cytomel. Need to stop Cytomel and increase Synthroid to 2 tablets of 125 mcg daily, needs new prescription

## 2019-07-25 NOTE — Telephone Encounter (Signed)
Called pt and left detailed voicemail requesting a call back to tell us if she would like to try jardiance.

## 2019-07-26 ENCOUNTER — Other Ambulatory Visit: Payer: Self-pay

## 2019-07-26 ENCOUNTER — Encounter: Payer: Self-pay | Admitting: Orthopedic Surgery

## 2019-07-26 ENCOUNTER — Ambulatory Visit (INDEPENDENT_AMBULATORY_CARE_PROVIDER_SITE_OTHER): Payer: Self-pay | Admitting: Orthopedic Surgery

## 2019-07-26 VITALS — BP 139/86 | HR 77 | Ht 64.0 in | Wt 226.0 lb

## 2019-07-26 DIAGNOSIS — M76821 Posterior tibial tendinitis, right leg: Secondary | ICD-10-CM

## 2019-07-26 DIAGNOSIS — M25571 Pain in right ankle and joints of right foot: Secondary | ICD-10-CM

## 2019-07-26 NOTE — Patient Instructions (Signed)
Orthotics  Exercises

## 2019-07-26 NOTE — Progress Notes (Signed)
Chief Complaint  Patient presents with  . Foot Pain    right better after therapy     This 48 years old history of multiple sclerosis status post left foot and ankle reconstruction years ago at [facility] presented to [place] in July complaining of her ankle feeling like it did before with a feeling that something was moving inside the ankle, she complained of pain in the lateral subtalar region with difficulty walking and stiffness  She went to therapy was unhappy we sent her to a different therapy site to work on posterior tibial tendon dysfunction and pain in the foot and ankle area  Physical findings were tenderness in the subtalar region decreased subtalar and ankle motion with a plantigrade foot with tightness of the Achilles  X-ray showed a calcaneal spur on the plantar side and mild midfoot arthritis but no structural problems at that time  She's here for follow up   She reports improved symptoms with some mild pain.  She would like to run but I told her to become a walker  Her Achilles tendon tightness has improved she has mild pain in the midfoot and subtalar region her posterior tibial tendon seems to be improved in terms of pain and tenderness no swelling  She does have home exercises I recommended Salonpas with lidocaine Spenco orthotics and see [place] if things do not get better  Encounter Diagnoses  Name Primary?  . Posterior tibial tendon dysfunction (PTTD) of right lower extremity Yes  . Pain in right ankle and joints of right foot     Fu prn

## 2019-07-28 NOTE — Telephone Encounter (Signed)
Called lmom for pt to call the office to get scheduled. 

## 2019-07-28 NOTE — Telephone Encounter (Signed)
Ok to do 12/16 at 1 pm.

## 2019-07-30 ENCOUNTER — Emergency Department (HOSPITAL_COMMUNITY)
Admission: EM | Admit: 2019-07-30 | Discharge: 2019-07-30 | Disposition: A | Discharge status: Home / Self Care | Payer: Self-pay | Attending: Emergency Medicine | Admitting: Emergency Medicine

## 2019-07-30 ENCOUNTER — Emergency Department (HOSPITAL_COMMUNITY): Payer: Self-pay

## 2019-07-30 ENCOUNTER — Other Ambulatory Visit: Payer: Self-pay

## 2019-07-30 ENCOUNTER — Encounter (HOSPITAL_COMMUNITY): Payer: Self-pay | Admitting: *Deleted

## 2019-07-30 DIAGNOSIS — R1032 Left lower quadrant pain: Secondary | ICD-10-CM | POA: Insufficient documentation

## 2019-07-30 DIAGNOSIS — Z79899 Other long term (current) drug therapy: Secondary | ICD-10-CM | POA: Insufficient documentation

## 2019-07-30 DIAGNOSIS — I10 Essential (primary) hypertension: Secondary | ICD-10-CM | POA: Insufficient documentation

## 2019-07-30 DIAGNOSIS — E039 Hypothyroidism, unspecified: Secondary | ICD-10-CM | POA: Insufficient documentation

## 2019-07-30 DIAGNOSIS — Z96652 Presence of left artificial knee joint: Secondary | ICD-10-CM | POA: Insufficient documentation

## 2019-07-30 DIAGNOSIS — E119 Type 2 diabetes mellitus without complications: Secondary | ICD-10-CM | POA: Insufficient documentation

## 2019-07-30 HISTORY — DX: Prediabetes: R73.03

## 2019-07-30 LAB — COMPREHENSIVE METABOLIC PANEL
ALT: 53 U/L — ABNORMAL HIGH (ref 0–44)
AST: 44 U/L — ABNORMAL HIGH (ref 15–41)
Albumin: 4 g/dL (ref 3.5–5.0)
Alkaline Phosphatase: 117 U/L (ref 38–126)
Anion gap: 9 (ref 5–15)
BUN: 16 mg/dL (ref 6–20)
CO2: 23 mmol/L (ref 22–32)
Calcium: 9.4 mg/dL (ref 8.9–10.3)
Chloride: 105 mmol/L (ref 98–111)
Creatinine, Ser: 0.75 mg/dL (ref 0.44–1.00)
GFR calc Af Amer: >60 mL/min (ref >60)
GFR calc non Af Amer: >60 mL/min (ref >60)
Glucose, Bld: 143 mg/dL — ABNORMAL HIGH (ref 70–99)
Potassium: 4.1 mmol/L (ref 3.5–5.1)
Sodium: 137 mmol/L (ref 135–145)
Total Bilirubin: 0.6 mg/dL (ref 0.3–1.2)
Total Protein: 8.5 g/dL — ABNORMAL HIGH (ref 6.5–8.1)

## 2019-07-30 LAB — URINALYSIS, ROUTINE W REFLEX MICROSCOPIC
Bilirubin Urine: NEGATIVE
Glucose, UA: NEGATIVE mg/dL
Hgb urine dipstick: NEGATIVE
Ketones, ur: NEGATIVE mg/dL
Leukocytes,Ua: NEGATIVE
Nitrite: NEGATIVE
Protein, ur: NEGATIVE mg/dL
Specific Gravity, Urine: 1.017 (ref 1.005–1.030)
pH: 7 (ref 5.0–8.0)

## 2019-07-30 LAB — CBC WITH DIFFERENTIAL/PLATELET
Abs Immature Granulocytes: 0.02 10*3/uL (ref 0.00–0.07)
Basophils Absolute: 0 10*3/uL (ref 0.0–0.1)
Basophils Relative: 1 %
Eosinophils Absolute: 0.2 10*3/uL (ref 0.0–0.5)
Eosinophils Relative: 3 %
HCT: 42.1 % (ref 36.0–46.0)
Hemoglobin: 13.6 g/dL (ref 12.0–15.0)
Immature Granulocytes: 0 %
Lymphocytes Relative: 23 %
Lymphs Abs: 1.8 10*3/uL (ref 0.7–4.0)
MCH: 29.6 pg (ref 26.0–34.0)
MCHC: 32.3 g/dL (ref 30.0–36.0)
MCV: 91.5 fL (ref 80.0–100.0)
Monocytes Absolute: 0.4 10*3/uL (ref 0.1–1.0)
Monocytes Relative: 6 %
Neutro Abs: 5.1 10*3/uL (ref 1.7–7.7)
Neutrophils Relative %: 67 %
Platelets: 331 10*3/uL (ref 150–400)
RBC: 4.6 MIL/uL (ref 3.87–5.11)
RDW: 12.9 % (ref 11.5–15.5)
WBC: 7.5 10*3/uL (ref 4.0–10.5)
nRBC: 0 % (ref 0.0–0.2)

## 2019-07-30 LAB — LIPASE, BLOOD: Lipase: 26 U/L (ref 11–51)

## 2019-07-30 IMAGING — CT CT ABD-PELV W/ CM
2 of 5 series · 16 of 46 positions shown, 18 images · IV contrast (omnipaque)
Comparison: None.

CLINICAL DATA: Left lower quadrant abdominal pain intermittently
for 4 days. Prior cholecystectomy and hysterectomy.

EXAM:
CT ABDOMEN AND PELVIS WITH CONTRAST
TECHNIQUE: Multidetector CT imaging of the abdomen and pelvis was performed
using the standard protocol following bolus administration of
intravenous contrast.
CONTRAST:  100mL OMNIPAQUE IOHEXOL 300 MG/ML  SOLN

[Series 2: axial st · axial · 0.75mm/px · z∈[-495,-90]mm · 13 of 95 slices shown, 15 images]
[im 7/95  soft-tissue]
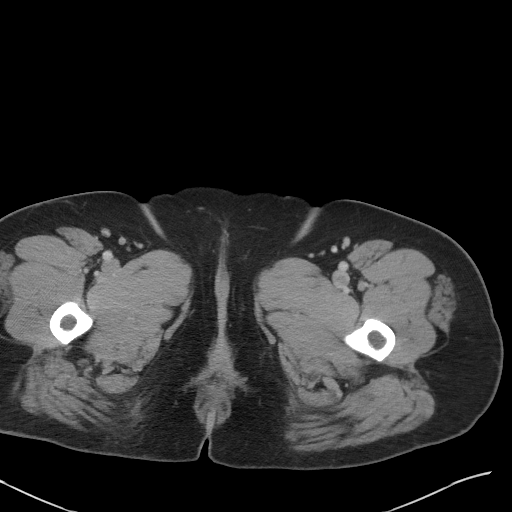
[im 7/95  bone]
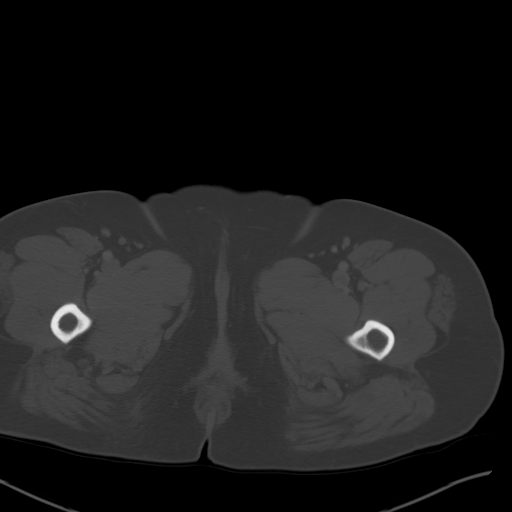
[im 14/95  soft-tissue]
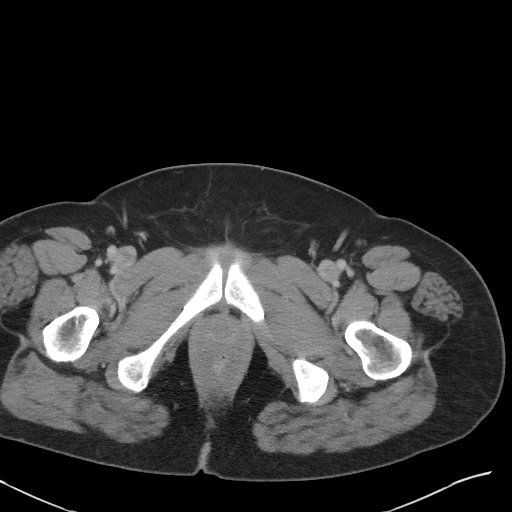
[im 21/95  soft-tissue]
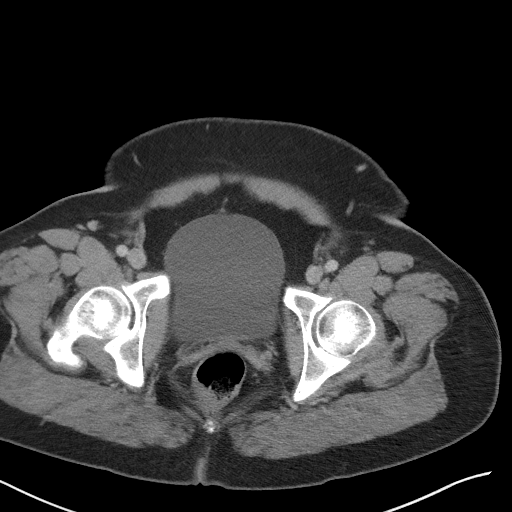
[im 27/95  soft-tissue]
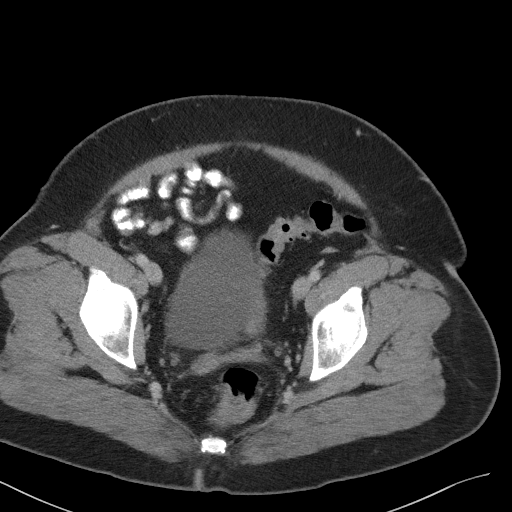
[im 34/95  soft-tissue]
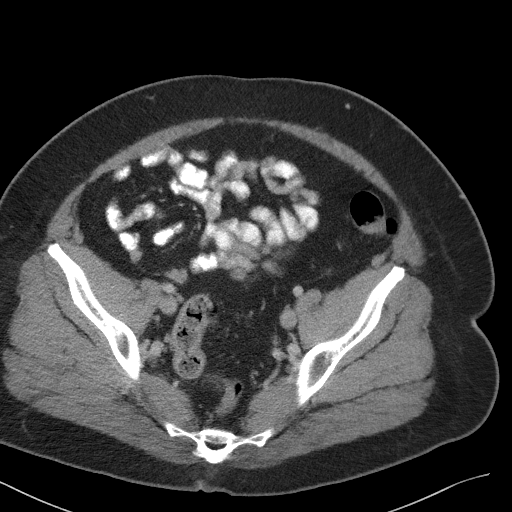
[im 41/95  soft-tissue]
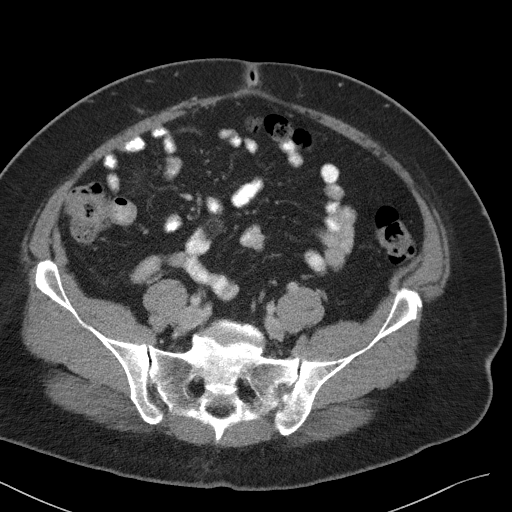
[im 48/95  soft-tissue]
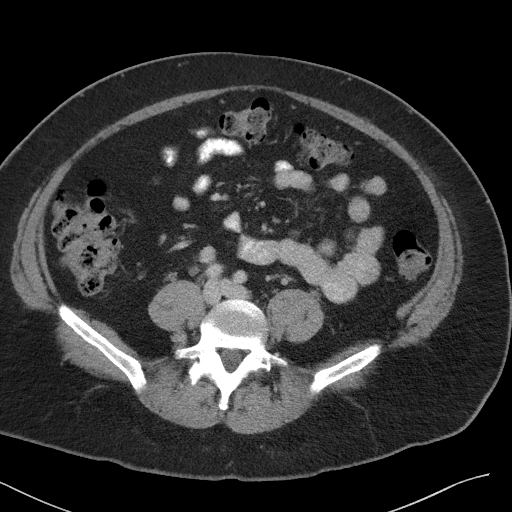
[im 54/95  soft-tissue]
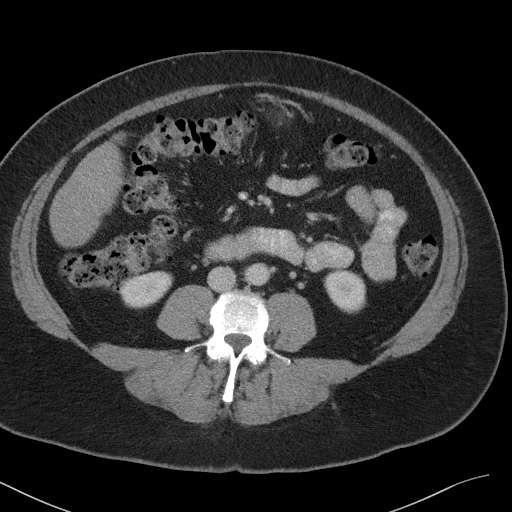
[im 61/95  soft-tissue]
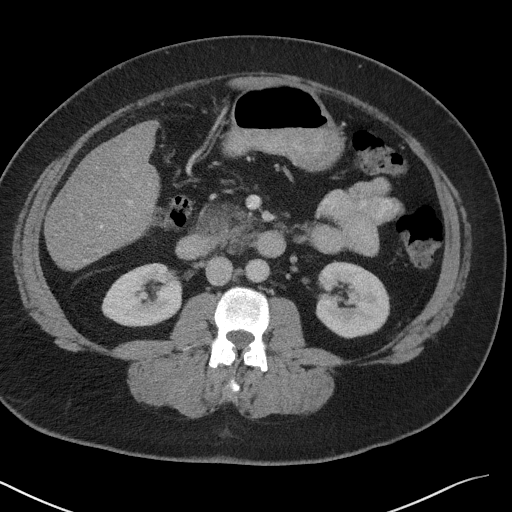
[im 61/95  bone]
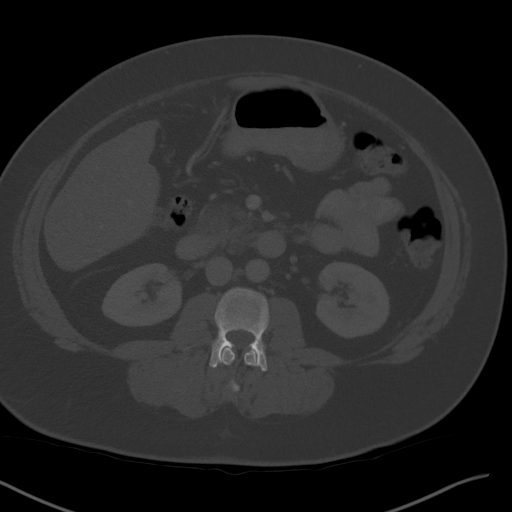
[im 68/95  soft-tissue]
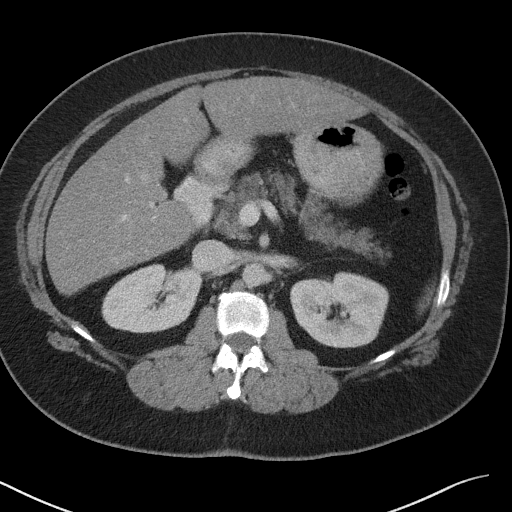
[im 74/95  soft-tissue]
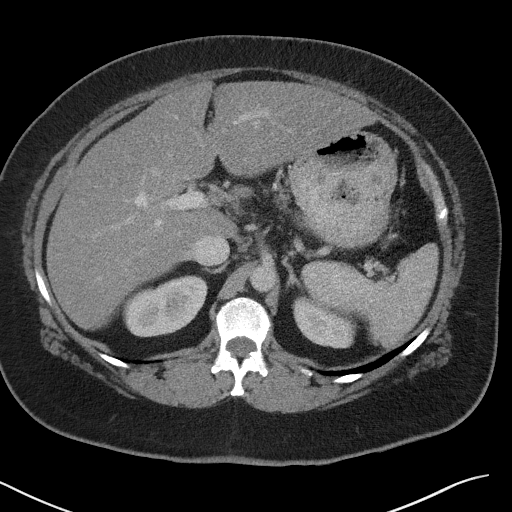
[im 81/95  soft-tissue]
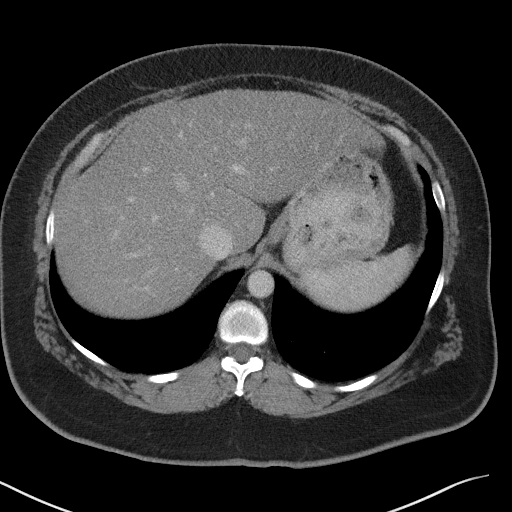
[im 88/95  soft-tissue]
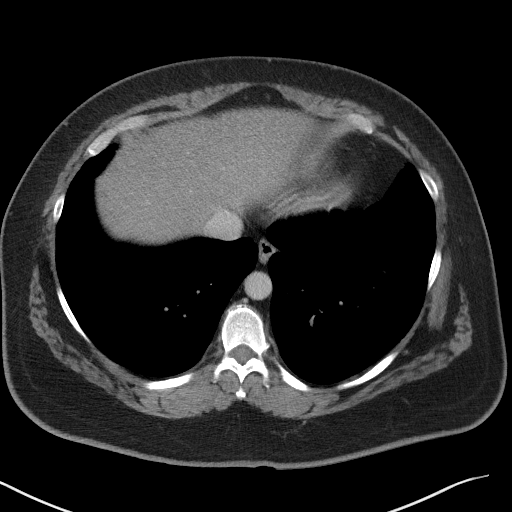

[Series 6: coronal st · coronal · 0.83mm/px · 3 of 119 slices shown]
[im 40/119  soft-tissue]
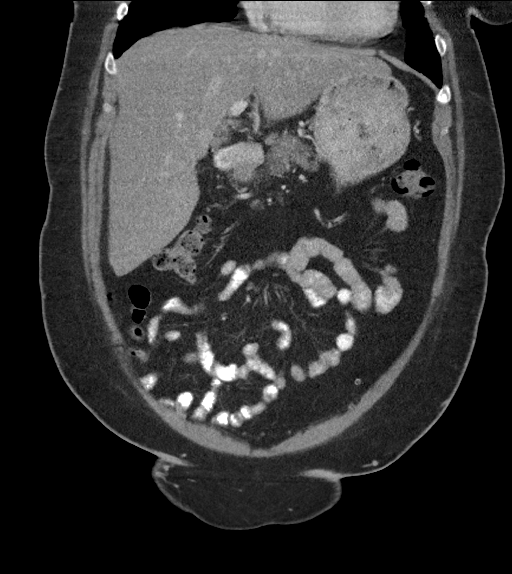
[im 53/119  soft-tissue]
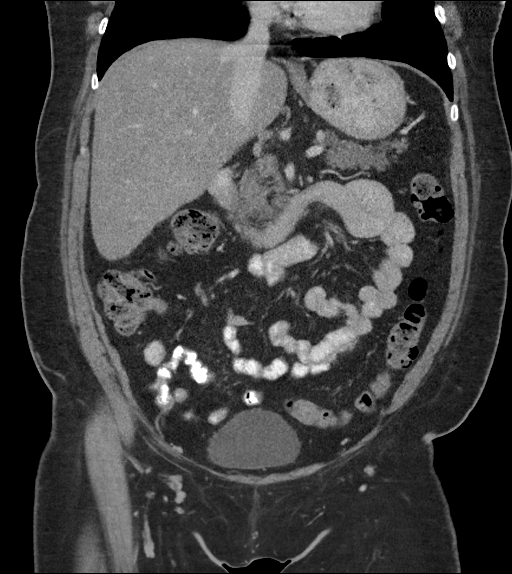
[im 66/119  soft-tissue]
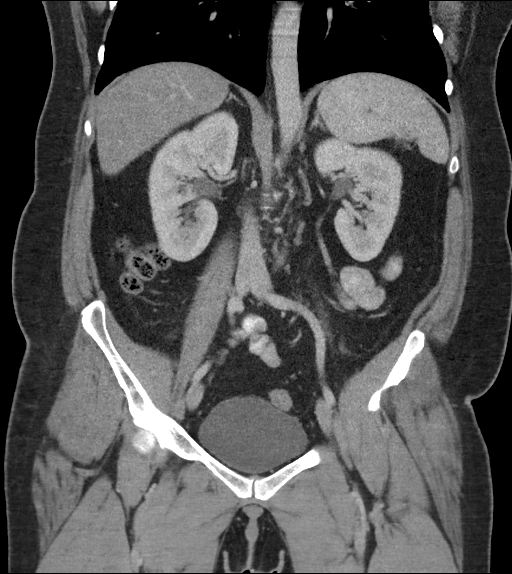

[16 of 46 positions shown; findings below may reference images not displayed]

FINDINGS: Lower chest: Medial right middle lobe 4 mm solid pulmonary nodule
(series 5/image 7).

Hepatobiliary: Probable diffuse hepatic steatosis. No definite liver
surface irregularity. No liver masses. Cholecystectomy. No biliary
ductal dilatation.

Pancreas: Normal, with no mass or duct dilation.

Spleen: Normal size. No mass.

Adrenals/Urinary Tract: Normal adrenals. Normal kidneys with no
hydronephrosis and no renal mass. Normal bladder.

Stomach/Bowel: Normal non-distended stomach. Normal caliber small
bowel with no small bowel wall thickening. Normal diminutive
appendix. Normal large bowel with no diverticulosis, large bowel
wall thickening or pericolonic fat stranding.

Vascular/Lymphatic: Normal caliber abdominal aorta. Patent portal,
splenic, hepatic and renal veins. No pathologically enlarged lymph
nodes in the abdomen or pelvis.

Reproductive: Status post hysterectomy, with no abnormal findings at
the vaginal cuff. No adnexal mass.

Other: No pneumoperitoneum, ascites or focal fluid collection.

Musculoskeletal: No aggressive appearing focal osseous lesions.
IMPRESSION: 1. No acute abnormality. No evidence of bowel obstruction or acute
bowel inflammation.
2. Probable diffuse hepatic steatosis.
3. Solid 4 mm right middle lobe pulmonary nodule. No follow-up
needed if patient is low-risk. Non-contrast chest CT can be
considered in 12 months if patient is high-risk. This recommendation
follows the consensus statement: Guidelines for Management of
Incidental Pulmonary Nodules Detected on CT Images:From the
[HOSPITAL] [LD]; published online before print
(10.1148/radiol.[PHONE_NUMBER]).

## 2019-07-30 MED ORDER — IOHEXOL 300 MG/ML  SOLN
100.0000 mL | Freq: Once | INTRAMUSCULAR | Status: AC | PRN
  Administered 2019-07-30: 15:00:00 100 mL via INTRAVENOUS

## 2019-07-30 MED ORDER — SODIUM CHLORIDE 0.9 % IV BOLUS
1000.0000 mL | Freq: Once | INTRAVENOUS | Status: AC
  Administered 2019-07-30: 1000 mL via INTRAVENOUS

## 2019-07-30 MED ORDER — ONDANSETRON HCL 4 MG/2ML IJ SOLN
4.0000 mg | Freq: Once | INTRAMUSCULAR | Status: AC
  Administered 2019-07-30: 4 mg via INTRAVENOUS
  Filled 2019-07-30: qty 2

## 2019-07-30 MED ORDER — HYDROCODONE-ACETAMINOPHEN 5-325 MG PO TABS: 1.0000 | ORAL_TABLET | ORAL | 0 refills | Status: DC | PRN

## 2019-07-30 MED ORDER — DICYCLOMINE HCL 20 MG PO TABS: 20.0000 mg | ORAL_TABLET | Freq: Two times a day (BID) | ORAL | 0 refills | Status: DC

## 2019-07-30 MED ORDER — DICYCLOMINE HCL 10 MG PO CAPS
20.0000 mg | ORAL_CAPSULE | Freq: Once | ORAL | Status: AC
  Administered 2019-07-30: 20 mg via ORAL
  Filled 2019-07-30: qty 2

## 2019-07-30 NOTE — Discharge Instructions (Addendum)
Your labs and CT imaging are negative today with no worrisome findings found for the source of your pain.  Specifically, we find no blockages, mass, no diverticulitis or other abdominal infections.  You may try the bentyl which can help with intestinal spasm (colic) which can be quite painful.  You may take the hydrocodone prescribed for pain relief.  This will make you drowsy - do not drive within 4 hours of taking this medication.  Also use caution with this medicine as it can cause constipation if taken too frequently.  As discussed, you have a small incidental nodule in your right lung field.  Since you have no risk factors for lung problems (you are not a smoker) you do not need any further evaluation of this finding.

## 2019-07-30 NOTE — ED Triage Notes (Signed)
Pt c/o intermittent LLQ pain x 4 days. Pt reports very intense pain at times. Denies n/v/d and fevers.

## 2019-07-30 NOTE — ED Provider Notes (Signed)
[facility] EMERGENCY DEPARTMENT Provider Note   CSN: [id number] Arrival date & time: 07/30/19  1051     [facility]   [facility] Complaint  Patient presents with   Abdominal Pain    HPI Amy Lester is a 48 y.o. female with a history of asthma, GERD, HTN, fatty liver, MS and who is pre diabetic but endorses was just prescribed Invokana which she has not started, presenting with a 4 day history of constant LLQ abdominal pain.  She describes low grade aching pain with episodic stabs of severe nonradiating pain lasting 30 sec-1 minute.  She endorses 10+ episodes of sharp pain today. Denies n/v, dysuria, hematuria or flank pain,vaginal discharge, also has been afebrile and has maintained a normal appetite.  Not worsened with movement or certain positions.  She has found no alleviators.       HPI  Past Medical History:  Diagnosis Date   Asthma    Blood transfusion without reported diagnosis    Chronic migraine    Common migraine with intractable migraine 02/16/2019   Endometriosis    Fatty liver    GERD (gastroesophageal reflux disease)    Hyperlipidemia    Hypertension    Hypothyroidism    Incontinence    Irregular heartbeat    Multiple sclerosis (HCC)    Palpitations    Pre-diabetes    Sleep apnea     Patient Active Problem List   Diagnosis Date Noted   Class 2 severe obesity due to excess calories with serious comorbidity and body mass index (BMI) of 38.0 to 38.9 in adult (HCC) 04/12/2019   Type 2 diabetes mellitus without complication (HCC) 04/12/2019   Palpitations 04/12/2019   Mild intermittent asthma without complication 04/12/2019   Seasonal allergies 04/12/2019   Hypothyroidism 04/12/2019   OSA treated with BiPAP 04/12/2019   Gastroesophageal reflux disease 04/12/2019   Essential hypertension 04/12/2019   Intractable chronic migraine without aura 02/16/2019   MS (multiple sclerosis) (HCC) 02/16/2019    Past Surgical  History:  Procedure Laterality Date   ABDOMINAL HYSTERECTOMY     CESAREAN SECTION     x1   CHOLECYSTECTOMY     ENDOMETRIAL ABLATION     FOOT SURGERY Left 06/2018   foot reconstruction   REPLACEMENT TOTAL KNEE Left      OB History   No obstetric history on file.      Home Medications    Prior to Admission medications   Medication Sig Start Date End Date Taking? Authorizing Provider  albuterol (VENTOLIN HFA) 108 (90 Base) MCG/ACT inhaler Inhale 3 puffs into the lungs every 6 (six) hours as needed for wheezing or shortness of breath. 03/28/19  Yes Jacquelin HawkingMcElroy, Shannon, PA-C  atorvastatin (LIPITOR) 10 MG tablet Take 1 tablet (10 mg total) by mouth daily. 02/27/19  Yes Jacquelin HawkingMcElroy, Shannon, PA-C  cetirizine (ZYRTEC) 10 MG tablet Take 1 tablet (10 mg total) by mouth daily. 03/28/19  Yes Jacquelin HawkingMcElroy, Shannon, PA-C  lisinopril (ZESTRIL) 10 MG tablet Take 1 tablet (10 mg total) by mouth daily. 02/27/19  Yes Jacquelin HawkingMcElroy, Shannon, PA-C  metoprolol succinate (TOPROL-XL) 100 MG 24 hr tablet Take 1 tablet (100 mg total) by mouth daily. Take with or immediately following a meal. 05/09/19 08/07/19 Yes Strader, GrenadaBrittany M, PA-C  montelukast (SINGULAIR) 10 MG tablet Take 1 tablet (10 mg total) by mouth at bedtime. 05/04/19  Yes Coralyn HellingSood, Vineet, MD  clotrimazole-betamethasone (LOTRISONE) cream Apply 1 application topically as needed. 04/12/19   Deeann SaintBanks, Shannon R, MD  dicyclomine (BENTYL) 20 MG tablet Take 1 tablet (20 mg total) by mouth 2 (two) times daily. 07/30/19   Dagan Heinz, Almyra Free, PA-C  Erenumab-aooe (AIMOVIG, 140 MG DOSE, [place]) Inject into the skin.    [provider]  estradiol (EVAMIST) 1.53 MG/SPRAY transdermal spray Place 3 sprays onto the skin daily.    [provider]  HYDROcodone-acetaminophen (NORCO/VICODIN) 5-325 MG tablet Take 1 tablet by mouth every 4 (four) hours as needed. 07/30/19   Evalee Jefferson, PA-C  levothyroxine (SYNTHROID) 125 MCG tablet Take two tablets (250mg  total) by mouth once daily in  the morning. 07/25/19   Elayne Snare, MD  metroNIDAZOLE (FLAGYL) 500 MG tablet Take 1 tablet (500 mg total) by mouth 2 (two) times daily. 05/26/19   Loura Halt A, NP  metroNIDAZOLE (METROGEL) 1 % gel Apply 1 application topically daily.    [provider]  Multiple Vitamins-Minerals (MULTIVITAMIN WITH MINERALS) tablet Take 1 tablet by mouth daily.    [provider]  nystatin cream (MYCOSTATIN) Apply 1 application topically daily as needed for dry skin.    [provider]  Olopatadine HCl (PAZEO) 0.7 % SOLN Apply to eye every morning.    [provider]  omeprazole (PRILOSEC) 40 MG capsule Take 1 capsule (40 mg total) by mouth daily. 03/28/19   Soyla Dryer, PA-C  ondansetron (ZOFRAN) 8 MG tablet Take 8 mg by mouth as needed for nausea or vomiting.    [provider]  Red Yeast Rice Extract (RED YEAST RICE PO) Take 1 tablet by mouth daily.     [provider]  Riboflavin (VITAMIN B-2 PO) Take by mouth.    [provider]  rizatriptan (MAXALT) 10 MG tablet Take 1 tablet (10 mg total) by mouth 3 (three) times daily as needed for migraine. May repeat in 2 hours if needed 06/21/19   Suzzanne Cloud, NP  topiramate (TOPAMAX) 25 MG tablet Take 2 at bedtime Patient taking differently: Take 25 mg by mouth 2 (two) times daily. Take 1 tablet by mouth twice daily. 06/21/19   Suzzanne Cloud, NP  TRETINOIN EX Apply topically daily.    [provider]  triamcinolone cream (KENALOG) 0.1 % Apply 1 application topically 2 (two) times daily as needed. 03/28/19   Soyla Dryer, PA-C  triamterene-hydrochlorothiazide (MAXZIDE-25) 37.5-25 MG tablet Take 1 tablet by mouth daily.    [provider]    Family History Family History  Problem Relation Age of Onset   Heart attack Mother    Stroke Mother    Cancer Mother        breast cancer   Hyperlipidemia Mother    Hypothyroidism Mother    Cancer Father        lymph nodes,  liver cancer   Other Daughter        Fine Gold Type II Syndrome and cognetive issues   Memory loss Daughter    Other Son        Fine Gold Syndrome and BPES   Diabetes Son 24       IDDM   Hypothyroidism Sister    Hyperthyroidism Neg Hx     Social History Social History   Tobacco Use   Smoking status: Never Smoker   Smokeless tobacco: Never Used  Substance Use Topics   Alcohol use: Not Currently    Frequency: Never    Comment: once a year   Drug use: Never     Allergies   Percocet [oxycodone-acetaminophen]   Review  of Systems Review of Systems  Constitutional: Negative for chills and fever.  HENT: Negative for congestion and sore throat.   Eyes: Negative.   Respiratory: Negative for chest tightness and shortness of breath.   Cardiovascular: Negative for chest pain.  Gastrointestinal: Positive for abdominal pain. Negative for constipation, diarrhea, nausea and vomiting.  Genitourinary: Negative.  Negative for dysuria, flank pain, frequency and hematuria.  Musculoskeletal: Negative for arthralgias, joint swelling and neck pain.  Skin: Negative.  Negative for rash and wound.  Neurological: Negative for dizziness, weakness, light-headedness, numbness and headaches.  Psychiatric/Behavioral: Negative.      Physical Exam Updated Vital Signs BP 132/81 (BP Location: Left Arm)    Pulse 74    Temp 98 F (36.7 C) (Oral)    Resp 18    Ht 5\' 4"  (1.626 m)    Wt 99.8 kg    SpO2 99%    BMI 37.76 kg/m   Physical Exam Vitals signs and nursing note reviewed.  Constitutional:      Appearance: She is well-developed.  HENT:     Head: Normocephalic and atraumatic.  Eyes:     Conjunctiva/sclera: Conjunctivae normal.  Neck:     Musculoskeletal: Normal range of motion.  Cardiovascular:     Rate and Rhythm: Normal rate and regular rhythm.     Heart sounds: Normal heart sounds.  Pulmonary:     Effort: Pulmonary effort is normal.     Breath sounds: Normal breath sounds.  No wheezing.  Abdominal:     General: Abdomen is protuberant. Bowel sounds are normal.     Palpations: Abdomen is soft.     Tenderness: There is abdominal tenderness in the left lower quadrant. There is guarding. There is no right CVA tenderness, left CVA tenderness or rebound.     Hernia: No hernia is present.     Comments: No obvious palpable abdominal hernia.  Musculoskeletal: Normal range of motion.  Skin:    General: Skin is warm and dry.  Neurological:     Mental Status: She is alert.      ED Treatments / Results  Labs (all labs ordered are listed, but only abnormal results are displayed) Labs Reviewed  COMPREHENSIVE METABOLIC PANEL - Abnormal; Notable for the following components:      Result Value   Glucose, Bld 143 (*)    Total Protein 8.5 (*)    AST 44 (*)    ALT 53 (*)    All other components within normal limits  LIPASE, BLOOD  CBC WITH DIFFERENTIAL/PLATELET  URINALYSIS, ROUTINE W REFLEX MICROSCOPIC    EKG None  Radiology Ct Abdomen Pelvis W Contrast  Result Date: 07/30/2019 CLINICAL DATA:  Left lower quadrant abdominal pain intermittently for 4 days. Prior cholecystectomy and hysterectomy. EXAM: CT ABDOMEN AND PELVIS WITH CONTRAST TECHNIQUE: Multidetector CT imaging of the abdomen and pelvis was performed using the standard protocol following bolus administration of intravenous contrast. CONTRAST:  OMNIPAQUE IOHEXOL 300 MG/ML  SOLN COMPARISON:  None. FINDINGS: Lower chest: Medial right middle lobe 4 mm solid pulmonary nodule (series 5/image 7). Hepatobiliary: Probable diffuse hepatic steatosis. No definite liver surface irregularity. No liver masses. Cholecystectomy. No biliary ductal dilatation. Pancreas: Normal, with no mass or duct dilation. Spleen: Normal size. No mass. Adrenals/Urinary Tract: Normal adrenals. Normal kidneys with no hydronephrosis and no renal mass. Normal bladder. Stomach/Bowel: Normal non-distended stomach. Normal caliber small bowel  with no small bowel wall thickening. Normal diminutive appendix. Normal large bowel with no diverticulosis, large  bowel wall thickening or pericolonic fat stranding. Vascular/Lymphatic: Normal caliber abdominal aorta. Patent portal, splenic, hepatic and renal veins. No pathologically enlarged lymph nodes in the abdomen or pelvis. Reproductive: Status post hysterectomy, with no abnormal findings at the vaginal cuff. No adnexal mass. Other: No pneumoperitoneum, ascites or focal fluid collection. Musculoskeletal: No aggressive appearing focal osseous lesions. IMPRESSION: 1. No acute abnormality. No evidence of bowel obstruction or acute bowel inflammation. 2. Probable diffuse hepatic steatosis. 3. Solid 4 mm right middle lobe pulmonary nodule. No follow-up needed if patient is low-risk. Non-contrast chest CT can be considered in 12 months if patient is high-risk. This recommendation follows the consensus statement: Guidelines for Management of Incidental Pulmonary Nodules Detected on CT Images:From the Fleischner Society 2017; published online before print (10.1148/radiol.[id number]). Electronically Signed   By: Delbert Phenix M.D.   On: 07/30/2019 15:35    Procedures Procedures (including critical care time)  Medications Ordered in ED Medications  sodium chloride 0.9 % bolus 1,000 mL (0 mLs Intravenous Stopped 07/30/19 1350)  iohexol (OMNIPAQUE) 300 MG/ML solution 100 mL (100 mLs Intravenous Contrast Given 07/30/19 1505)  ondansetron (ZOFRAN) injection 4 mg (4 mg Intravenous Given 07/30/19 1512)  dicyclomine (BENTYL) capsule 20 mg (20 mg Oral Given 07/30/19 1617)     Initial Impression / Assessment and Plan / ED Course  I have reviewed the triage vital signs and the nursing notes.  Pertinent labs & imaging results that were available during my care of the patient were reviewed by me and considered in my medical decision making (see chart for details).        Pt with left lower abdominal pain with  no obvious findings based on CT imaging.  No diverticulitis or obstruction.  Labs reviewed and discussed with pt.  Mild elevation in LFT's which has been mildly elevated in the past, negative Hep panel previously, possibly elevated due to fatty liver.  No RUQ pain.  Possible function/colitis.  Pt prescribed bentyl, first dose given here.  Plan f/u with pcp and also given referral to GI as she reports has strong fam hx of colon ca and had received colonoscopies q 2.5 to 3 years prior to moving to [place]., last colonoscopy 3 years ago.  Return precautions outlined.   Discussed lung nodule.  Pt has no smoking hx.  Low risk,no f/u recommended.  Final Clinical Impressions(s) / ED Diagnoses   Final diagnoses:  Left lower quadrant abdominal pain    ED Discharge Orders         Ordered    dicyclomine (BENTYL) 20 MG tablet  2 times daily     07/30/19 1614    HYDROcodone-acetaminophen (NORCO/VICODIN) 5-325 MG tablet  Every 4 hours PRN     07/30/19 1614           Burgess Amor, Cordelia Poche 07/30/19 1640    Linwood Dibbles, MD 08/02/19 [phone]

## 2019-08-11 NOTE — Telephone Encounter (Signed)
Pt was called and scheduled !

## 2019-08-21 ENCOUNTER — Other Ambulatory Visit: Payer: Self-pay

## 2019-08-21 ENCOUNTER — Encounter: Payer: Self-pay | Admitting: Cardiology

## 2019-08-21 ENCOUNTER — Ambulatory Visit (INDEPENDENT_AMBULATORY_CARE_PROVIDER_SITE_OTHER): Payer: Self-pay | Admitting: Cardiology

## 2019-08-21 VITALS — BP 121/81 | HR 79 | Ht 64.0 in | Wt 229.0 lb

## 2019-08-21 DIAGNOSIS — R600 Localized edema: Secondary | ICD-10-CM

## 2019-08-21 DIAGNOSIS — E782 Mixed hyperlipidemia: Secondary | ICD-10-CM

## 2019-08-21 DIAGNOSIS — R002 Palpitations: Secondary | ICD-10-CM

## 2019-08-21 DIAGNOSIS — I10 Essential (primary) hypertension: Secondary | ICD-10-CM

## 2019-08-21 MED ORDER — METOPROLOL TARTRATE 50 MG PO TABS: 50.0000 mg | ORAL_TABLET | ORAL | 1 refills | Status: DC

## 2019-08-21 MED ORDER — ATORVASTATIN CALCIUM 20 MG PO TABS: 20.0000 mg | ORAL_TABLET | Freq: Every day | ORAL | 1 refills | Status: DC

## 2019-08-21 MED ORDER — TRIAMTERENE-HCTZ 37.5-25 MG PO TABS: 1.0000 | ORAL_TABLET | Freq: Every day | ORAL | 1 refills | Status: DC

## 2019-08-21 MED ORDER — LISINOPRIL 5 MG PO TABS: 5.0000 mg | ORAL_TABLET | Freq: Every day | ORAL | 1 refills | Status: DC

## 2019-08-21 MED ORDER — METOPROLOL TARTRATE 100 MG PO TABS: 100.0000 mg | ORAL_TABLET | Freq: Every evening | ORAL | 1 refills | Status: DC

## 2019-08-21 NOTE — Patient Instructions (Addendum)
Your physician wants you to follow-up in: [place] will receive a reminder letter in the mail two months in advance. If you don't receive a letter, please call our office to schedule the follow-up appointment.  Your physician has recommended you make the following change in your medication:   INCREASE ATORVASTATIN 20 MG DAILY  CHANGE LOPRESSOR 50 MG IN THE MORNING AND 100 MG IN THE EVENING   DECREASE LISINOPRIL 5 MG DAILY   WE HAVE GIVEN YOU RX FOR COMPRESSION STOCKINGS   Thank you for choosing [facility]!!

## 2019-08-21 NOTE — Progress Notes (Signed)
Clinical Summary Amy Lester is a 48 y.o.female seen today for follow up of the following medical problems.   1. Palpitations - seen in ER with episode of palpitations. Occurred while laying in bed. Recent increase in symptoms. Heavy hard beats. Symptoms lasted about 10 minutes. Felt weak all over. In general episodes occur few times a week.  - rare cafffeine, no EtOH.  - followed previously in [place]. Reports wearing previous holter monitors - EKG SR in ER. TSH 1.7. hstrop neg, K 3.9  - last monitor 2-3 years ago she believes. She thinks overall benign.   03/2019 event monitor no arrhythmias  - still some palpitations at times.  - changed to toprol last visit but made her have fatigue, she went back to lopressor, only taking 25mg  in AM and 50mg  in PM   2. Hyperlipidemia - she is compliant with statin 02/2019 TC 246 HDL 45 TG 188 LDL 163   3. LE edema - primarily left sided, she has had prior left ankle surgery  05/2019 echo LVEF >33%, indet diastolic function - some swelling at times.   4. HTN - compliant with meds Past Medical History:  Diagnosis Date  . Asthma   . Blood transfusion without reported diagnosis   . Chronic migraine   . Common migraine with intractable migraine 02/16/2019  . Endometriosis   . Fatty liver   . GERD (gastroesophageal reflux disease)   . Hyperlipidemia   . Hypertension   . Hypothyroidism   . Incontinence   . Irregular heartbeat   . Multiple sclerosis ([place])   . Palpitations   . Pre-diabetes   . Sleep apnea      Allergies  Allergen Reactions  . Percocet [Oxycodone-Acetaminophen] Nausea And Vomiting     Current Outpatient Medications  Medication Sig Dispense Refill  . albuterol (VENTOLIN HFA) 108 (90 Base) MCG/ACT inhaler Inhale 3 puffs into the lungs every 6 (six) hours as needed for wheezing or shortness of breath. 3 g 1  . atorvastatin (LIPITOR) 10 MG tablet Take 1 tablet (10 mg total) by mouth daily. 90 tablet 1  .  cetirizine (ZYRTEC) 10 MG tablet Take 1 tablet (10 mg total) by mouth daily. 90 tablet 0  . clotrimazole-betamethasone (LOTRISONE) cream Apply 1 application topically as needed. 45 g 2  . dicyclomine (BENTYL) 20 MG tablet Take 1 tablet (20 mg total) by mouth 2 (two) times daily. 20 tablet 0  . Erenumab-aooe (AIMOVIG, 140 MG DOSE, [place]) Inject into the skin.    Marland Kitchen estradiol (EVAMIST) 1.53 MG/SPRAY transdermal spray Place 3 sprays onto the skin daily.    Marland Kitchen HYDROcodone-acetaminophen (NORCO/VICODIN) 5-325 MG tablet Take 1 tablet by mouth every 4 (four) hours as needed. 5 tablet 0  . levothyroxine (SYNTHROID) 125 MCG tablet Take two tablets (250mg  total) by mouth once daily in the morning. 180 tablet 1  . lisinopril (ZESTRIL) 10 MG tablet Take 1 tablet (10 mg total) by mouth daily. 90 tablet 0  . metoprolol succinate (TOPROL-XL) 100 MG 24 hr tablet Take 1 tablet (100 mg total) by mouth daily. Take with or immediately following a meal. 90 tablet 3  . metroNIDAZOLE (FLAGYL) 500 MG tablet Take 1 tablet (500 mg total) by mouth 2 (two) times daily. 14 tablet 0  . metroNIDAZOLE (METROGEL) 1 % gel Apply 1 application topically daily.    . montelukast (SINGULAIR) 10 MG tablet Take 1 tablet (10 mg total) by mouth at bedtime. 30 tablet 5  . Multiple  Vitamins-Minerals (MULTIVITAMIN WITH MINERALS) tablet Take 1 tablet by mouth daily.    Marland Kitchen nystatin cream (MYCOSTATIN) Apply 1 application topically daily as needed for dry skin.    Marland Kitchen Olopatadine HCl (PAZEO) 0.7 % SOLN Apply to eye every morning.    Marland Kitchen omeprazole (PRILOSEC) 40 MG capsule Take 1 capsule (40 mg total) by mouth daily. 90 capsule 0  . ondansetron (ZOFRAN) 8 MG tablet Take 8 mg by mouth as needed for nausea or vomiting.    . Red Yeast Rice Extract (RED YEAST RICE PO) Take 1 tablet by mouth daily.     . Riboflavin (VITAMIN B-2 PO) Take by mouth.    . rizatriptan (MAXALT) 10 MG tablet Take 1 tablet (10 mg total) by mouth 3 (three) times daily as needed for  migraine. May repeat in 2 hours if needed 27 tablet 1  . topiramate (TOPAMAX) 25 MG tablet Take 2 at bedtime (Patient taking differently: Take 25 mg by mouth 2 (two) times daily. Take 1 tablet by mouth twice daily.) 180 tablet 1  . TRETINOIN EX Apply topically daily.    Marland Kitchen triamcinolone cream (KENALOG) 0.1 % Apply 1 application topically 2 (two) times daily as needed. 45 g 0  . triamterene-hydrochlorothiazide (MAXZIDE-25) 37.5-25 MG tablet Take 1 tablet by mouth daily.     No current facility-administered medications for this visit.     Past Surgical History:  Procedure Laterality Date  . ABDOMINAL HYSTERECTOMY    . CESAREAN SECTION     x1  . CHOLECYSTECTOMY    . ENDOMETRIAL ABLATION    . FOOT SURGERY Left 06/2018   foot reconstruction  . REPLACEMENT TOTAL KNEE Left      Allergies  Allergen Reactions  . Percocet [Oxycodone-Acetaminophen] Nausea And Vomiting      Family History  Problem Relation Age of Onset  . Heart attack Mother   . Stroke Mother   . Cancer Mother        breast cancer  . Hyperlipidemia Mother   . Hypothyroidism Mother   . Cancer Father        lymph nodes, liver cancer  . Other Daughter        Fine Gold Type II Syndrome and cognetive issues  . Memory loss Daughter   . Other Son        Fine Gold Syndrome and BPES  . Diabetes Son 22       IDDM  . Hypothyroidism Sister   . Hyperthyroidism Neg Hx      Social History Ms. Nardiello reports that she has never smoked. She has never used smokeless tobacco. Ms. Boateng reports previous alcohol use.   Review of Systems CONSTITUTIONAL: No weight loss, fever, chills, weakness or fatigue.  HEENT: Eyes: No visual loss, blurred vision, double vision or yellow sclerae.No hearing loss, sneezing, congestion, runny nose or sore throat.  SKIN: No rash or itching.  CARDIOVASCULAR: per hpi RESPIRATORY: No shortness of breath, cough or sputum.  GASTROINTESTINAL: No anorexia, nausea, vomiting or diarrhea. No abdominal  pain or blood.  GENITOURINARY: No burning on urination, no polyuria NEUROLOGICAL: No headache, dizziness, syncope, paralysis, ataxia, numbness or tingling in the extremities. No change in bowel or bladder control.  MUSCULOSKELETAL: No muscle, back pain, joint pain or stiffness.  LYMPHATICS: No enlarged nodes. No history of splenectomy.  PSYCHIATRIC: No history of depression or anxiety.  ENDOCRINOLOGIC: No reports of sweating, cold or heat intolerance. No polyuria or polydipsia.  Marland Kitchen   Physical Examination Today's Vitals  08/21/19 1426  BP: 121/81  Pulse: 79  SpO2: 98%  Weight: 229 lb (103.9 kg)  Height: 5\' 4"  (1.626 m)   Body mass index is 39.31 kg/m.  Gen: resting comfortably, no acute distress HEENT: no scleral icterus, pupils equal round and reactive, no palptable cervical adenopathy,  CV: RRR, no m/r/g no jvd Resp: Clear to auscultation bilaterally GI: abdomen is soft, non-tender, non-distended, normal bowel sounds, no hepatosplenomegaly MSK: extremities are warm, no edema.  Skin: warm, no rash Neuro:  no focal deficits Psych: appropriate affect   Diagnostic Studies  05/2019 echo IMPRESSIONS    1. The left ventricle has hyperdynamic systolic function, with an ejection fraction of >65%. The cavity size was normal. Left ventricular diastolic parameters were normal. No evidence of left ventricular regional wall motion abnormalities.  2. The right ventricle has normal systolic function. The cavity was normal. There is no increase in right ventricular wall thickness.  3. The aortic valve is tricuspid. Mild aortic annular calcification noted.  4. The mitral valve is grossly normal.  5. The tricuspid valve is grossly normal.  6. The aorta is normal unless otherwise noted.   03/2019 event monitor  14 day event monitor  Min HR 55, Max HR 161, Avg HR 80  Reported symptoms correlate with sinus rhythm  No significant arrhythmias    Assessment and Plan   1.  Palpitations - benign event monitor.  - remains on lopressor for symptoms, increase to 50mg  in AM and 100mg  in pm   2. HTN - at goal. Some orthostatic symptoms at times, she endorsed adequate hydration - since increase lopressor, will lower her lisinopril to 5mg  daily.   3. Hyperlipidemia - not at goal, increase atorva to 20mg  daily.   4. LE edema - benign echo, likely venous insufficiency - given Rx for compression stockings, continue diuretic  F/u 6 months   04/2019, M.D.

## 2019-08-22 ENCOUNTER — Other Ambulatory Visit: Payer: Self-pay

## 2019-08-23 ENCOUNTER — Encounter: Payer: Self-pay | Admitting: Family Medicine

## 2019-08-23 ENCOUNTER — Ambulatory Visit (INDEPENDENT_AMBULATORY_CARE_PROVIDER_SITE_OTHER): Payer: Medicaid Other | Admitting: Family Medicine

## 2019-08-23 VITALS — BP 130/72 | HR 88 | Temp 97.7°F | Wt 230.0 lb

## 2019-08-23 DIAGNOSIS — H9211 Otorrhea, right ear: Secondary | ICD-10-CM

## 2019-08-23 DIAGNOSIS — I10 Essential (primary) hypertension: Secondary | ICD-10-CM

## 2019-08-23 DIAGNOSIS — E782 Mixed hyperlipidemia: Secondary | ICD-10-CM

## 2019-08-23 DIAGNOSIS — E039 Hypothyroidism, unspecified: Secondary | ICD-10-CM

## 2019-08-23 NOTE — Progress Notes (Signed)
Subjective:    Patient ID: Amy Lester, female    DOB: 12-23-1970, 48 y.o.   MRN: [id number]  No chief complaint on file.   HPI Patient was seen today for f/u.  Pt states her meds were recently adjusted.  Pt no longer taking cytomel for thyroid.  Taking synthroid 250 mcg.  Pt states her insurance will cover the dose if written as a rx for 200 mcg and 50 mcg.  BP at home 130/80, at times the diastolic "creeps up near the 90s".  Pt denies changes in diet.  Eating 2 eggs daily, not eating any prepackaged meals.  May have oatmeal a few times per wk with her eggs.  Pt has not been exercising 2/ 2 foot pain.  Pt also notes brownish drainage from R ear noted on pillow.  Pt denies ear pain or edema.    Past Medical History:  Diagnosis Date  . Asthma   . Blood transfusion without reported diagnosis   . Chronic migraine   . Common migraine with intractable migraine 02/16/2019  . Endometriosis   . Fatty liver   . GERD (gastroesophageal reflux disease)   . Hyperlipidemia   . Hypertension   . Hypothyroidism   . Incontinence   . Irregular heartbeat   . Multiple sclerosis ([facility])   . Palpitations   . Pre-diabetes   . Sleep apnea     Allergies  Allergen Reactions  . Percocet [Oxycodone-Acetaminophen] Nausea And Vomiting    ROS General: Denies fever, chills, night sweats, changes in weight, changes in appetite HEENT: Denies headaches, ear pain, changes in vision, rhinorrhea, sore throat  +R ear drainage CV: Denies CP, palpitations, SOB, orthopnea Pulm: Denies SOB, cough, wheezing GI: Denies abdominal pain, nausea, vomiting, diarrhea, constipation GU: Denies dysuria, hematuria, frequency, vaginal discharge Msk: Denies muscle cramps, joint pains  +L foot pain Neuro: Denies weakness, numbness, tingling Skin: Denies rashes, bruising Psych: Denies depression, anxiety, hallucinations     Objective:    Blood pressure 140/80, pulse 88, temperature 97.7 F (36.5 C), temperature source  Temporal, weight 230 lb (104.3 kg), SpO2 98 %.   Gen. Pleasant, well-nourished, obese, in no distress, normal affect   HEENT: [place]/AT, face symmetric, no scleral icterus, PERRLA, EOMI, nares patent without drainage, TMs normal b/l, minimal cerumen in canals b/l.  No lesions or drainage noted. Lungs: no accessory muscle use, CTAB, no wheezes or rales Cardiovascular: RRR, no m/r/g, no peripheral edema Musculoskeletal: No deformities, no cyanosis or clubbing, normal tone Neuro:  A&Ox3, CN II-XII intact, normal gait Skin:  Warm, no lesions/ rash   Wt Readings from Last 3 Encounters:  08/23/19 230 lb (104.3 kg)  08/21/19 229 lb (103.9 kg)  07/30/19 220 lb (99.8 kg)    Lab Results  Component Value Date   WBC 7.5 07/30/2019   HGB 13.6 07/30/2019   HCT 42.1 07/30/2019   PLT 331 07/30/2019   GLUCOSE 143 (H) 07/30/2019   CHOL 246 (H) 02/24/2019   TRIG 188 (H) 02/24/2019   HDL 45 02/24/2019   LDLCALC 163 (H) 02/24/2019   ALT 53 (H) 07/30/2019   AST 44 (H) 07/30/2019   NA 137 07/30/2019   K 4.1 07/30/2019   CL 105 07/30/2019   CREATININE 0.75 07/30/2019   BUN 16 07/30/2019   CO2 23 07/30/2019   TSH 0.81 07/24/2019   HGBA1C 7.8 (H) 07/24/2019   MICROALBUR 0.9 07/24/2019    Assessment/Plan:  Essential hypertension -stable -recheck 13072 this visit -discussed lifestyle modifications -  encouraged to consider chair exercises 2/2 h/o foot pain.  Pt declined. -continue current meds: lisinopril 5 mg, lopressor 50 mg in am and 100 mg in pm, triamterene-HCTZ 37.5-12.5 mg  Mixed hyperlipidemia -Total cholesterol 246 triglycerides 186 on 02/24/19 -discussed cutting down on egg consumption.   -discussed other ways to reduce cholesterol -continue atorvastatin 20 mg  Hypothyroidism, unspecified type -cytomel d/c'd -continue synthroid 250 mcg -continue f/u with Endocrinology, Dr. Dwyane Dee  Drainage from R ear -likely cerumen -R ear and canal normal on exam -will continue to  monitor.  F/u 3-4 mo  Grier Mitts, MD  This note is not being shared with the patient for the following reason: To prevent harm (release of this note would result in harm to the life or physical safety of the patient or another).

## 2019-08-23 NOTE — Patient Instructions (Addendum)
High Cholesterol  High cholesterol is a condition in which the blood has high levels of a white, waxy, fat-like substance (cholesterol). The human body needs small amounts of cholesterol. The liver makes all the cholesterol that the body needs. Extra (excess) cholesterol comes from the food that we eat. Cholesterol is carried from the liver by the blood through the blood vessels. If you have high cholesterol, deposits (plaques) may build up on the walls of your blood vessels (arteries). Plaques make the arteries narrower and stiffer. Cholesterol plaques increase your risk for heart attack and stroke. Work with your health care provider to keep your cholesterol levels in a healthy range. What increases the risk? This condition is more likely to develop in people who:  Eat foods that are high in animal fat (saturated fat) or cholesterol.  Are overweight.  Are not getting enough exercise.  Have a family history of high cholesterol. What are the signs or symptoms? There are no symptoms of this condition. How is this diagnosed? This condition may be diagnosed from the results of a blood test.  If you are older than age 20, your health care provider may check your cholesterol every 4-6 years.  You may be checked more often if you already have high cholesterol or other risk factors for heart disease. The blood test for cholesterol measures:  "Bad" cholesterol (LDL cholesterol). This is the main type of cholesterol that causes heart disease. The desired level for LDL is less than 100.  "Good" cholesterol (HDL cholesterol). This type helps to protect against heart disease by cleaning the arteries and carrying the LDL away. The desired level for HDL is 60 or higher.  Triglycerides. These are fats that the body can store or burn for energy. The desired number for triglycerides is lower than 150.  Total cholesterol. This is a measure of the total amount of cholesterol in your blood, including LDL  cholesterol, HDL cholesterol, and triglycerides. A healthy number is less than 200. How is this treated? This condition is treated with diet changes, lifestyle changes, and medicines. Diet changes  This may include eating more whole grains, fruits, vegetables, nuts, and fish.  This may also include cutting back on red meat and foods that have a lot of added sugar. Lifestyle changes  Changes may include getting at least 40 minutes of aerobic exercise 3 times a week. Aerobic exercises include walking, biking, and swimming. Aerobic exercise along with a healthy diet can help you maintain a healthy weight.  Changes may also include quitting smoking. Medicines  Medicines are usually given if diet and lifestyle changes have failed to reduce your cholesterol to healthy levels.  Your health care provider may prescribe a statin medicine. Statin medicines have been shown to reduce cholesterol, which can reduce the risk of heart disease. Follow these instructions at home: Eating and drinking If told by your health care provider:  Eat chicken (without skin), fish, veal, shellfish, ground turkey breast, and round or loin cuts of red meat.  Do not eat fried foods or fatty meats, such as hot dogs and salami.  Eat plenty of fruits, such as apples.  Eat plenty of vegetables, such as broccoli, potatoes, and carrots.  Eat beans, peas, and lentils.  Eat grains such as barley, rice, couscous, and bulgur wheat.  Eat pasta without cream sauces.  Use skim or nonfat milk, and eat low-fat or nonfat yogurt and cheeses.  Do not eat or drink whole milk, cream, ice cream, egg yolks,   or hard cheeses.  Do not eat stick margarine or tub margarines that contain trans fats (also called partially hydrogenated oils).  Do not eat saturated tropical oils, such as coconut oil and palm oil.  Do not eat cakes, cookies, crackers, or other baked goods that contain trans fats.  General instructions  Exercise as  directed by your health care provider. Increase your activity level with activities such as gardening, walking, and taking the stairs.  Take over-the-counter and prescription medicines only as told by your health care provider.  Do not use any products that contain nicotine or tobacco, such as cigarettes and e-cigarettes. If you need help quitting, ask your health care provider.  Keep all follow-up visits as told by your health care provider. This is important. Contact a health care provider if:  You are struggling to maintain a healthy diet or weight.  You need help to start on an exercise program.  You need help to stop smoking. Get help right away if:  You have chest pain.  You have trouble breathing. This information is not intended to replace advice given to you by your health care provider. Make sure you discuss any questions you have with your health care provider. Document Released: 08/24/2005 Document Revised: 08/27/2017 Document Reviewed: 02/22/2016 Elsevier Patient Education  [place].  Cholesterol Content in Foods Cholesterol is a waxy, fat-like substance that helps to carry fat in the blood. The body needs cholesterol in small amounts, but too much cholesterol can cause damage to the arteries and heart. Most people should eat less than 200 milligrams (mg) of cholesterol a day. Foods with cholesterol  Cholesterol is found in animal-based foods, such as meat, seafood, and dairy. Generally, low-fat dairy and lean meats have less cholesterol than full-fat dairy and fatty meats. The milligrams of cholesterol per serving (mg per serving) of common cholesterol-containing foods are listed below. Meat and other proteins  Egg -- one large whole egg has 186 mg.  Veal shank -- 4 oz has 141 mg.  Lean ground [place] (93% lean) -- 4 oz has 118 mg.  Fat-trimmed lamb loin -- 4 oz has 106 mg.  Lean ground beef (90% lean) -- 4 oz has 100 mg.  Lobster -- 3.5 oz has 90  mg.  Pork loin chops -- 4 oz has 86 mg.  Canned salmon -- 3.5 oz has 83 mg.  Fat-trimmed beef top loin -- 4 oz has 78 mg.  Frankfurter -- 1 frank (3.5 oz) has 77 mg.  Crab -- 3.5 oz has 71 mg.  Roasted chicken without skin, white meat -- 4 oz has 66 mg.  Light bologna -- 2 oz has 45 mg.  Deli-cut [place] -- 2 oz has 31 mg.  Canned tuna -- 3.5 oz has 31 mg.  Berniece Salines -- 1 oz has 29 mg.  Oysters and mussels (raw) -- 3.5 oz has 25 mg.  Mackerel -- 1 oz has 22 mg.  Trout -- 1 oz has 20 mg.  Pork sausage -- 1 link (1 oz) has 17 mg.  Salmon -- 1 oz has 16 mg.  Tilapia -- 1 oz has 14 mg. Dairy  Soft-serve ice cream --  cup (4 oz) has 103 mg.  Whole-milk yogurt -- 1 cup (8 oz) has 29 mg.  Cheddar cheese -- 1 oz has 28 mg.  American cheese -- 1 oz has 28 mg.  Whole milk -- 1 cup (8 oz) has 23 mg.  2% milk -- 1 cup (8  oz) has 18 mg.  Cream cheese -- 1 tablespoon (Tbsp) has 15 mg.  Cottage cheese --  cup (4 oz) has 14 mg.  Low-fat (1%) milk -- 1 cup (8 oz) has 10 mg.  Sour cream -- 1 Tbsp has 8.5 mg.  Low-fat yogurt -- 1 cup (8 oz) has 8 mg.  Nonfat Greek yogurt -- 1 cup (8 oz) has 7 mg.  Half-and-half cream -- 1 Tbsp has 5 mg. Fats and oils  Cod liver oil -- 1 tablespoon (Tbsp) has 82 mg.  Butter -- 1 Tbsp has 15 mg.  Lard -- 1 Tbsp has 14 mg.  Bacon grease -- 1 Tbsp has 14 mg.  Mayonnaise -- 1 Tbsp has 5-10 mg.  Margarine -- 1 Tbsp has 3-10 mg. Exact amounts of cholesterol in these foods may vary depending on specific ingredients and brands. Foods without cholesterol Most plant-based foods do not have cholesterol unless you combine them with a food that has cholesterol. Foods without cholesterol include:  Grains and cereals.  Vegetables.  Fruits.  Vegetable oils, such as olive, canola, and sunflower oil.  Legumes, such as peas, beans, and lentils.  Nuts and seeds.  Egg whites. Summary  The body needs cholesterol in small amounts, but  too much cholesterol can cause damage to the arteries and heart.  Most people should eat less than 200 milligrams (mg) of cholesterol a day. This information is not intended to replace advice given to you by your health care provider. Make sure you discuss any questions you have with your health care provider. Document Released: 04/20/2017 Document Revised: 08/06/2017 Document Reviewed: 04/20/2017 Elsevier Patient Education  2020 [employer].  Managing Your Hypertension Hypertension is commonly called high blood pressure. This is when the force of your blood pressing against the walls of your arteries is too strong. Arteries are blood vessels that carry blood from your heart throughout your body. Hypertension forces the heart to work harder to pump blood, and may cause the arteries to become narrow or stiff. Having untreated or uncontrolled hypertension can cause heart attack, stroke, kidney disease, and other problems. What are blood pressure readings? A blood pressure reading consists of a higher number over a lower number. Ideally, your blood pressure should be below 120/80. The first ("top") number is called the systolic pressure. It is a measure of the pressure in your arteries as your heart beats. The second ("bottom") number is called the diastolic pressure. It is a measure of the pressure in your arteries as the heart relaxes. What does my blood pressure reading mean? Blood pressure is classified into four stages. Based on your blood pressure reading, your health care provider may use the following stages to determine what type of treatment you need, if any. Systolic pressure and diastolic pressure are measured in a unit called mm Hg. Normal  Systolic pressure: below 120.  Diastolic pressure: below 80. Elevated  Systolic pressure: 120-129.  Diastolic pressure: below 80. Hypertension stage 1  Systolic pressure: 130-139.  Diastolic pressure: 80-89. Hypertension stage 2  Systolic  pressure: 140 or above.  Diastolic pressure: 90 or above. What health risks are associated with hypertension? Managing your hypertension is an important responsibility. Uncontrolled hypertension can lead to:  A heart attack.  A stroke.  A weakened blood vessel (aneurysm).  Heart failure.  Kidney damage.  Eye damage.  Metabolic syndrome.  Memory and concentration problems. What changes can I make to manage my hypertension? Hypertension can be managed by making lifestyle  changes and possibly by taking medicines. Your health care provider will help you make a plan to bring your blood pressure within a normal range. Eating and drinking   Eat a diet that is high in fiber and potassium, and low in salt (sodium), added sugar, and fat. An example eating plan is called the DASH (Dietary Approaches to Stop Hypertension) diet. To eat this way: ? Eat plenty of fresh fruits and vegetables. Try to fill half of your plate at each meal with fruits and vegetables. ? Eat whole grains, such as whole wheat pasta, brown rice, or whole grain bread. Fill about one quarter of your plate with whole grains. ? Eat low-fat diary products. ? Avoid fatty cuts of meat, processed or cured meats, and poultry with skin. Fill about one quarter of your plate with lean proteins such as fish, chicken without skin, beans, eggs, and tofu. ? Avoid premade and processed foods. These tend to be higher in sodium, added sugar, and fat.  Reduce your daily sodium intake. Most people with hypertension should eat less than 1,500 mg of sodium a day.  Limit alcohol intake to no more than 1 drink a day for nonpregnant women and 2 drinks a day for men. One drink equals 12 oz of beer, 5 oz of wine, or 1 oz of hard liquor. Lifestyle  Work with your health care provider to maintain a healthy body weight, or to lose weight. Ask what an ideal weight is for you.  Get at least 30 minutes of exercise that causes your heart to beat  faster (aerobic exercise) most days of the week. Activities may include walking, swimming, or biking.  Include exercise to strengthen your muscles (resistance exercise), such as weight lifting, as part of your weekly exercise routine. Try to do these types of exercises for 30 minutes at least 3 days a week.  Do not use any products that contain nicotine or tobacco, such as cigarettes and e-cigarettes. If you need help quitting, ask your health care provider.  Control any long-term (chronic) conditions you have, such as high cholesterol or diabetes. Monitoring  Monitor your blood pressure at home as told by your health care provider. Your personal target blood pressure may vary depending on your medical conditions, your age, and other factors.  Have your blood pressure checked regularly, as often as told by your health care provider. Working with your health care provider  Review all the medicines you take with your health care provider because there may be side effects or interactions.  Talk with your health care provider about your diet, exercise habits, and other lifestyle factors that may be contributing to hypertension.  Visit your health care provider regularly. Your health care provider can help you create and adjust your plan for managing hypertension. Will I need medicine to control my blood pressure? Your health care provider may prescribe medicine if lifestyle changes are not enough to get your blood pressure under control, and if:  Your systolic blood pressure is 130 or higher.  Your diastolic blood pressure is 80 or higher. Take medicines only as told by your health care provider. Follow the directions carefully. Blood pressure medicines must be taken as prescribed. The medicine does not work as well when you skip doses. Skipping doses also puts you at risk for problems. Contact a health care provider if:  You think you are having a reaction to medicines you have taken.  You  have repeated (recurrent) headaches.  You feel dizzy.  You have swelling in your ankles.  You have trouble with your vision. Get help right away if:  You develop a severe headache or confusion.  You have unusual weakness or numbness, or you feel faint.  You have severe pain in your chest or abdomen.  You vomit repeatedly.  You have trouble breathing. Summary  Hypertension is when the force of blood pumping through your arteries is too strong. If this condition is not controlled, it may put you at risk for serious complications.  Your personal target blood pressure may vary depending on your medical conditions, your age, and other factors. For most people, a normal blood pressure is less than 120/80.  Hypertension is managed by lifestyle changes, medicines, or both. Lifestyle changes include weight loss, eating a healthy, low-sodium diet, exercising more, and limiting alcohol. This information is not intended to replace advice given to you by your health care provider. Make sure you discuss any questions you have with your health care provider. Document Released: 05/18/2012 Document Revised: 12/16/2018 Document Reviewed: 07/22/2016 Elsevier Patient Education  2020 [place], Adult The ears produce a substance called earwax that helps keep bacteria out of the ear and protects the skin in the ear canal. Occasionally, earwax can build up in the ear and cause discomfort or hearing loss. What increases the risk? This condition is more likely to develop in people who:  Are female.  Are elderly.  Naturally produce more earwax.  Clean their ears often with cotton swabs.  Use earplugs often.  Use in-ear headphones often.  Wear hearing aids.  Have narrow ear canals.  Have earwax that is overly thick or sticky.  Have eczema.  Are dehydrated.  Have excess hair in the ear canal. What are the signs or symptoms? Symptoms of this condition  include:  Reduced or muffled hearing.  A feeling of fullness in the ear or feeling that the ear is plugged.  Fluid coming from the ear.  Ear pain.  Ear itch.  Ringing in the ear.  Coughing.  An obvious piece of earwax that can be seen inside the ear canal. How is this diagnosed? This condition may be diagnosed based on:  Your symptoms.  Your medical history.  An ear exam. During the exam, your health care provider will look into your ear with an instrument called an otoscope. You may have tests, including a hearing test. How is this treated? This condition may be treated by:  Using ear drops to soften the earwax.  Having the earwax removed by a health care provider. The health care provider may: ? Flush the ear with water. ? Use an instrument that has a loop on the end (curette). ? Use a suction device.  Surgery to remove the wax buildup. This may be done in severe cases. Follow these instructions at home:   Take over-the-counter and prescription medicines only as told by your health care provider.  Do not put any objects, including cotton swabs, into your ear. You can clean the opening of your ear canal with a washcloth or facial tissue.  Follow instructions from your health care provider about cleaning your ears. Do not over-clean your ears.  Drink enough fluid to keep your urine clear or pale yellow. This will help to thin the earwax.  Keep all follow-up visits as told by your health care provider. If earwax builds up in your ears often or if you use hearing aids, consider seeing your health care provider for  routine, preventive ear cleanings. Ask your health care provider how often you should schedule your cleanings.  If you have hearing aids, clean them according to instructions from the manufacturer and your health care provider. Contact a health care provider if:  You have ear pain.  You develop a fever.  You have blood, pus, or other fluid coming from  your ear.  You have hearing loss.  You have ringing in your ears that does not go away.  Your symptoms do not improve with treatment.  You feel like the room is spinning (vertigo). Summary  Earwax can build up in the ear and cause discomfort or hearing loss.  The most common symptoms of this condition include reduced or muffled hearing and a feeling of fullness in the ear or feeling that the ear is plugged.  This condition may be diagnosed based on your symptoms, your medical history, and an ear exam.  This condition may be treated by using ear drops to soften the earwax or by having the earwax removed by a health care provider.  Do not put any objects, including cotton swabs, into your ear. You can clean the opening of your ear canal with a washcloth or facial tissue. This information is not intended to replace advice given to you by your health care provider. Make sure you discuss any questions you have with your health care provider. Document Released: 10/01/2004 Document Revised: 08/06/2017 Document Reviewed: 11/04/2016 Elsevier Patient Education  2020 [employer]
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
## 2019-09-14 ENCOUNTER — Other Ambulatory Visit: Payer: Self-pay | Admitting: Physician Assistant

## 2019-10-16 ENCOUNTER — Other Ambulatory Visit: Payer: Self-pay | Admitting: Neurology

## 2019-10-16 ENCOUNTER — Other Ambulatory Visit: Payer: Self-pay

## 2019-10-16 ENCOUNTER — Telehealth: Payer: Self-pay

## 2019-10-16 ENCOUNTER — Ambulatory Visit
Admission: EM | Admit: 2019-10-16 | Discharge: 2019-10-16 | Disposition: A | Discharge status: Home / Self Care | Payer: Medicaid Other | Attending: Emergency Medicine | Admitting: Emergency Medicine

## 2019-10-16 DIAGNOSIS — R300 Dysuria: Secondary | ICD-10-CM

## 2019-10-16 DIAGNOSIS — H00011 Hordeolum externum right upper eyelid: Secondary | ICD-10-CM | POA: Insufficient documentation

## 2019-10-16 LAB — POCT URINALYSIS DIP (MANUAL ENTRY)
Bilirubin, UA: NEGATIVE
Glucose, UA: >=1000 mg/dL — AB
Ketones, POC UA: NEGATIVE mg/dL
Leukocytes, UA: NEGATIVE
Nitrite, UA: NEGATIVE
Protein Ur, POC: NEGATIVE mg/dL
Spec Grav, UA: 1.025 (ref 1.010–1.025)
Urobilinogen, UA: 0.2 E.U./dL
pH, UA: 5.5 (ref 5.0–8.0)

## 2019-10-16 LAB — POCT FASTING CBG KUC MANUAL ENTRY: POCT Glucose (KUC): 313 mg/dL — AB (ref 70–99)

## 2019-10-16 MED ORDER — ERYTHROMYCIN 5 MG/GM OP OINT: TOPICAL_OINTMENT | OPHTHALMIC | 0 refills | Status: DC

## 2019-10-16 NOTE — ED Triage Notes (Signed)
Pt reports r eye pain and drainage x 3 days.  Reports feels like something is under her upper eyelid scratching her eye.  Reports pain with urination since yesterday.

## 2019-10-16 NOTE — Discharge Instructions (Addendum)
Continue warm compresses at home.  Soak a wash cloth in warm (not scalding) water and place it over the eyes. As the wash cloth cools, it should be rewarmed and replaced for a total of 5 to 10 minutes of soaking time. Warm compresses should be applied two to four times a day as long as the patient has symptoms Perform lid washing: Either warm water or very dilute baby shampoo can be placed on a clean wash cloth, gauze pad, or cotton swab. Then be advised to gently clean along the lashes and lid margin to remove the accumulated material with care to avoid contacting the ocular surface. If shampoo is used, thorough rinsing is recommended. Vigorous washing should be avoided, as it may cause more irritation.  Prescribed erythromycin ointment.  Apply up to 6 times daily for 5-7 days, or until symptomatic improvement Referral to opthalmology for further evaluation and management Return or go to ER if you have any new or worsening symptoms   POCT urine analysis was negative for active infection Urine sample will be sent for culture Cervical ancilliary test was completed Someone will call only with abnormal result

## 2019-10-16 NOTE — ED Provider Notes (Addendum)
[facility]    CSN: [id number] Arrival date & time: 10/16/19  1139      History   Chief Complaint Chief Complaint  Patient presents with  . Eye Pain  . Dysuria    HPI Amy Lester is a 49 y.o. female.   Who Complaint of eye pain, crusting  and irritation that began 3 days ago.  Denies a precipitating event, trauma, or personal contact with bacterial conjunctivitis.  Has tried OTC eye drops without relief.  Report nothing make her symptoms worse.  Denies eye pain, discharge, itching, vision changes, double vision, FB sensation.    She is also complaining of dysuria, abnormal discharge and abnormal smell.  Denies any precipitating event.  Has not used any new medication.  Currently not on any antibiotic.  Patient wants to be tested.  The history is provided by the patient. No language interpreter was used.  Eye Pain  Dysuria Associated symptoms: vaginal discharge     Past Medical History:  Diagnosis Date  . Asthma   . Blood transfusion without reported diagnosis   . Chronic migraine   . Common migraine with intractable migraine 02/16/2019  . Endometriosis   . Fatty liver   . GERD (gastroesophageal reflux disease)   . Hyperlipidemia   . Hypertension   . Hypothyroidism   . Incontinence   . Irregular heartbeat   . Multiple sclerosis (HCC)   . Palpitations   . Pre-diabetes   . Sleep apnea     Patient Active Problem List   Diagnosis Date Noted  . Class 2 severe obesity due to excess calories with serious comorbidity and body mass index (BMI) of 38.0 to 38.9 in adult (HCC) 04/12/2019  . Type 2 diabetes mellitus without complication (HCC) 04/12/2019  . Palpitations 04/12/2019  . Mild intermittent asthma without complication 04/12/2019  . Seasonal allergies 04/12/2019  . Hypothyroidism 04/12/2019  . OSA treated with BiPAP 04/12/2019  . Gastroesophageal reflux disease 04/12/2019  . Essential hypertension 04/12/2019  . Intractable chronic migraine without  aura 02/16/2019  . MS (multiple sclerosis) (HCC) 02/16/2019    Past Surgical History:  Procedure Laterality Date  . ABDOMINAL HYSTERECTOMY    . CESAREAN SECTION     x1  . CHOLECYSTECTOMY    . ENDOMETRIAL ABLATION    . FOOT SURGERY Left 06/2018   foot reconstruction  . REPLACEMENT TOTAL KNEE Left     OB History   No obstetric history on file.      Home Medications    Prior to Admission medications   Medication Sig Start Date End Date Taking? Authorizing Provider  albuterol (VENTOLIN HFA) 108 (90 Base) MCG/ACT inhaler Inhale 3 puffs into the lungs every 6 (six) hours as needed for wheezing or shortness of breath. 03/28/19  Yes Jacquelin Hawking, PA-C  atorvastatin (LIPITOR) 20 MG tablet Take 1 tablet (20 mg total) by mouth daily. 08/21/19 11/19/19 Yes Branch, Dorothe Pea, MD  cetirizine (ZYRTEC) 10 MG tablet Take 1 tablet (10 mg total) by mouth daily. 03/28/19  Yes Jacquelin Hawking, PA-C  clotrimazole-betamethasone (LOTRISONE) cream Apply 1 application topically as needed. 04/12/19  Yes Deeann Saint, MD  dicyclomine (BENTYL) 20 MG tablet Take 1 tablet (20 mg total) by mouth 2 (two) times daily. 07/30/19  Yes Idol, Raynelle Fanning, PA-C  Erenumab-aooe (AIMOVIG, 140 MG DOSE, [place]) Inject into the skin every 30 (thirty) days.    Yes [provider]  esomeprazole (NEXIUM) 40 MG capsule Take 40 mg by mouth every  morning.   Yes [provider]  estradiol (EVAMIST) 1.53 MG/SPRAY transdermal spray Place 3 sprays onto the skin daily.   Yes [provider]  ezetimibe (ZETIA) 10 MG tablet Take 20 mg by mouth daily.   Yes [provider]  levothyroxine (SYNTHROID) 125 MCG tablet Take two tablets (250mg  total) by mouth once daily in the morning. 07/25/19  Yes 07/27/19, MD  lisinopril (ZESTRIL) 5 MG tablet Take 1 tablet (5 mg total) by mouth daily. 08/21/19 11/19/19 Yes Branch, 11/21/19, MD  metFORMIN (GLUCOPHAGE) 500 MG tablet Take by mouth 2 (two) times daily with a  meal. Pt unsure of dose   Yes [provider]  metoprolol tartrate (LOPRESSOR) 100 MG tablet Take 1 tablet (100 mg total) by mouth every evening. 08/21/19 11/19/19 Yes Branch, 11/21/19, MD  metroNIDAZOLE (METROGEL) 1 % gel Apply 1 application topically daily.   Yes [provider]  montelukast (SINGULAIR) 10 MG tablet Take 10 mg by mouth every morning.   Yes [provider]  Multiple Vitamins-Minerals (MULTIVITAMIN WITH MINERALS) tablet Take 1 tablet by mouth daily.   Yes [provider]  nystatin cream (MYCOSTATIN) Apply 1 application topically daily as needed for dry skin.   Yes [provider]  Olopatadine HCl (PAZEO) 0.7 % SOLN Apply to eye every morning.   Yes [provider]  ondansetron (ZOFRAN) 8 MG tablet Take 8 mg by mouth as needed for nausea or vomiting.   Yes [provider]  pantoprazole (PROTONIX) 20 MG tablet Take 20 mg by mouth daily.   Yes [provider]  Red Yeast Rice Extract (RED YEAST RICE PO) Take 1 tablet by mouth daily.    Yes [provider]  Riboflavin (VITAMIN B-2 PO) Take by mouth daily.    Yes [provider]  rizatriptan (MAXALT) 10 MG tablet Take 1 tablet (10 mg total) by mouth 3 (three) times daily as needed for migraine. May repeat in 2 hours if needed 06/21/19  Yes 06/23/19, NP  TRETINOIN EX Apply topically daily.   Yes [provider]  triamcinolone cream (KENALOG) 0.1 % Apply 1 application topically 2 (two) times daily as needed. 03/28/19  Yes 03/30/19, PA-C  triamterene-hydrochlorothiazide (MAXZIDE-25) 37.5-25 MG tablet Take 1 tablet by mouth daily. 08/21/19  Yes Branch12/16/20, MD  erythromycin ophthalmic ointment Place a 1/2 inch ribbon of ointment into the lower eyelid. 10/16/19   Lillie Portner, 12/14/19, FNP  HYDROcodone-acetaminophen (NORCO/VICODIN) 5-325 MG tablet Take 1 tablet by mouth every 4 (four) hours as needed. 07/30/19   08/01/19, PA-C   metoprolol tartrate (LOPRESSOR) 50 MG tablet Take 1 tablet (50 mg total) by mouth every morning. 08/21/19   08/23/19, MD  topiramate (TOPAMAX) 25 MG tablet TAKE 1 Tablet BY MOUTH ONCE NIGHTLY FOR 1 WEEK, THEN TAKE 2 Tablets  BY MOUTH ONCE NIGHTLY FOR 1 WEEK, THEN TAKE 3 Tablets  BY MOUTH ONCE NIGHTLY 10/16/19   12/14/19, MD    Family History Family History  Problem Relation Age of Onset  . Heart attack Mother   . Stroke Mother   . Cancer Mother        breast cancer  . Hyperlipidemia Mother   . Hypothyroidism Mother   . Cancer Father        lymph nodes, liver cancer  . Other Daughter        Fine Gold Type II Syndrome and cognetive issues  .  Memory loss Daughter   . Other Son        Fine Gold Syndrome and BPES  . Diabetes Son 22       IDDM  . Hypothyroidism Sister   . Cancer Sister   . Hyperthyroidism Neg Hx     Social History Social History   Tobacco Use  . Smoking status: Never Smoker  . Smokeless tobacco: Never Used  Substance Use Topics  . Alcohol use: Not Currently    Comment: once a year  . Drug use: Never     Allergies   Percocet [oxycodone-acetaminophen]   Review of Systems Review of Systems  Constitutional: Negative.   Eyes: Positive for pain.  Respiratory: Negative.   Cardiovascular: Negative.   Gastrointestinal: Negative.   Genitourinary: Positive for dysuria and vaginal discharge.  Musculoskeletal: Negative.   All other systems reviewed and are negative.    Physical Exam Triage Vital Signs ED Triage Vitals  Enc Vitals Group     BP 10/16/19 1149 132/79     Pulse Rate 10/16/19 1149 80     Resp 10/16/19 1149 18     Temp 10/16/19 1149 98.2 F (36.8 C)     Temp Source 10/16/19 1149 Oral     SpO2 10/16/19 1149 96 %     Weight 10/16/19 1150 222 lb (100.7 kg)     Height 10/16/19 1150 5\' 3"  (1.6 m)     Head Circumference --      Peak Flow --      Pain Score 10/16/19 1150 7     Pain Loc --      Pain Edu? --      Excl. in GC?  --    No data found.  Updated Vital Signs BP 132/79 (BP Location: Right Arm)   Pulse 80   Temp 98.2 F (36.8 C) (Oral)   Resp 18   Ht 5\' 3"  (1.6 m)   Wt 222 lb (100.7 kg)   SpO2 96%   BMI 39.33 kg/m   Visual Acuity Right Eye Distance:   Left Eye Distance:   Bilateral Distance:    Right Eye Near:   Left Eye Near:    Bilateral Near:     Physical Exam Constitutional:      General: She is not in acute distress.    Appearance: Normal appearance. She is normal weight. She is not ill-appearing or toxic-appearing.  Eyes:     General: Lids are normal. Vision grossly intact.        Right eye: Discharge and hordeolum present. No foreign body.        Left eye: No foreign body or discharge.     Conjunctiva/sclera:     Right eye: Right conjunctiva is not injected.     Left eye: Left conjunctiva is not injected.     Pupils: Pupils are equal, round, and reactive to light.  Cardiovascular:     Rate and Rhythm: Normal rate and regular rhythm.     Pulses: Normal pulses.     Heart sounds: Normal heart sounds. No murmur. No gallop.   Pulmonary:     Effort: Pulmonary effort is normal. No respiratory distress.     Breath sounds: Normal breath sounds. No wheezing.  Chest:     Chest wall: No tenderness.  Abdominal:     General: Abdomen is flat. There is no distension.     Palpations: There is no mass.     Tenderness: There is no abdominal tenderness.  Hernia: No hernia is present.  Neurological:     Mental Status: She is alert.      UC Treatments / Results  Labs (all labs ordered are listed, but only abnormal results are displayed) Labs Reviewed  POCT URINALYSIS DIP (MANUAL ENTRY) - Abnormal; Notable for the following components:      Result Value   Glucose, UA >=1,000 (*)    Blood, UA small (*)    All other components within normal limits  POCT FASTING CBG [facility] MANUAL ENTRY - Abnormal; Notable for the following components:   POCT Glucose ([facility]) 313 (*)    All other  components within normal limits  URINE CULTURE  CERVICOVAGINAL ANCILLARY ONLY    EKG   Radiology No results found.  Procedures Procedures (including critical care time)  Medications Ordered in UC Medications - No data to display  Initial Impression / Assessment and Plan / UC Course  I have reviewed the triage vital signs and the nursing notes.  Pertinent labs & imaging results that were available during my care of the patient were reviewed by me and considered in my medical decision making (see chart for details).    Stye Fluorescent eye stain test  was completed and was negative for corneal abrasion. Care Instruction was given Erythromycin ophthalmic was prescribed Advised patient to go to ED for worsening of symptoms  Dysuria POCT urine analysis test was ordered and results showed small RBC.  Urine sent for culture Cervical ancillary test was ordered  Final Clinical Impressions(s) / UC Diagnoses   Final diagnoses:  Hordeolum externum of right upper eyelid  Dysuria     Discharge Instructions     Continue warm compresses at home.  Soak a wash cloth in warm (not scalding) water and place it over the eyes. As the wash cloth cools, it should be rewarmed and replaced for a total of 5 to 10 minutes of soaking time. Warm compresses should be applied two to four times a day as long as the patient has symptoms Perform lid washing: Either warm water or very dilute baby shampoo can be placed on a clean wash cloth, gauze pad, or cotton swab. Then be advised to gently clean along the lashes and lid margin to remove the accumulated material with care to avoid contacting the ocular surface. If shampoo is used, thorough rinsing is recommended. Vigorous washing should be avoided, as it may cause more irritation.  Prescribed erythromycin ointment.  Apply up to 6 times daily for 5-7 days, or until symptomatic improvement Referral to opthalmology for further evaluation and management  Return or go to ER if you have any new or worsening symptoms   POCT urine analysis was negative for active infection Urine sample will be sent for culture Cervical ancilliary test was completed Someone will call only with abnormal result    ED Prescriptions    Medication Sig Dispense Auth. Provider   erythromycin ophthalmic ointment Place a 1/2 inch ribbon of ointment into the lower eyelid. 3.5 g Emerson Monte, FNP     PDMP not reviewed this encounter.   Emerson Monte, FNP 10/16/19 1301    Emerson Monte, FNP 10/16/19 1345

## 2019-10-18 ENCOUNTER — Telehealth (HOSPITAL_COMMUNITY): Payer: Self-pay | Admitting: Emergency Medicine

## 2019-10-18 LAB — CERVICOVAGINAL ANCILLARY ONLY
Bacterial vaginitis: NEGATIVE
Candida vaginitis: POSITIVE — AB
Chlamydia: NEGATIVE
Neisseria Gonorrhea: NEGATIVE
Trichomonas: NEGATIVE

## 2019-10-18 LAB — URINE CULTURE

## 2019-10-18 MED ORDER — FLUCONAZOLE 150 MG PO TABS: 150.0000 mg | ORAL_TABLET | Freq: Once | ORAL | 0 refills | Status: AC

## 2019-10-18 NOTE — Telephone Encounter (Signed)
Test for candida (yeast) was positive.  Prescription for fluconazole 150mg po now, repeat dose in 3d if needed, #2 no refills, sent to the pharmacy of record.  Recheck or followup with PCP for further evaluation if symptoms are not improving.    Patient contacted by phone and made aware of    results. Pt verbalized understanding and had all questions answered.    

## 2019-11-02 ENCOUNTER — Encounter: Payer: Self-pay | Admitting: Neurology

## 2019-11-02 ENCOUNTER — Ambulatory Visit (INDEPENDENT_AMBULATORY_CARE_PROVIDER_SITE_OTHER): Payer: Self-pay | Admitting: Neurology

## 2019-11-02 ENCOUNTER — Other Ambulatory Visit: Payer: Self-pay

## 2019-11-02 VITALS — BP 145/94 | HR 85 | Temp 97.7°F | Ht 64.0 in | Wt 230.4 lb

## 2019-11-02 DIAGNOSIS — G35 Multiple sclerosis: Secondary | ICD-10-CM

## 2019-11-02 DIAGNOSIS — G43719 Chronic migraine without aura, intractable, without status migrainosus: Secondary | ICD-10-CM

## 2019-11-02 MED ORDER — DICLOFENAC POTASSIUM 50 MG PO TABS: 50.0000 mg | ORAL_TABLET | Freq: Two times a day (BID) | ORAL | 1 refills | Status: DC | PRN

## 2019-11-02 NOTE — Progress Notes (Signed)
PATIENT: Amy Lester DOB: 06/15/1971  REASON FOR VISIT: follow up HISTORY FROM: patient  HISTORY OF PRESENT ILLNESS: Today 11/02/19  Amy Lester is a 49 year old female with history of multiple sclerosis.  She indicates she was diagnosed with MS about 9 years ago at [facility].  Based on MRI findings, diagnosis of MS is in question, nonspecific white matter changes could be explained by longstanding history of severe migraine headaches.  She has canceled her appointment for lumbar puncture twice to try and confirm diagnosis of MS.  She was able to receive [place] patient assistance.  She has gotten Aimovig through the [facility].  She has had 3 injections, her headache frequency decreased, they are less intense.  She has headache 4 to 5 days a week, but was severe.  She reports Maxalt is beneficial.  With her severe headaches, she may see glitter in her vision.  When she has a headache, she struggles to get her thoughts together and focus.  She has stopped Topamax.  She has issues with dropping things in her hands.  Occasionally, her left hand may tremble.  She denies bowel or bladder incontinence, or changes to vision.  She presents today for evaluation unaccompanied.  HISTORY 06/21/2019 SS: Amy Lester is a 49 year old female with history of multiple sclerosis.  She indicates she was diagnosed with MS 9 years ago at [facility].  She was initially evaluated at this office by Dr. Jannifer Franklin in June 2020.  No medical records from her work-up for management at [facility] were provided. She reports chronic issues with numbness involving the left arm and leg.  She has pain throughout her body.  She has sleep apnea, using CPAP.  She also has significant migraine headaches.  She has tried Aimovig, Topamax, gabapentin, metoprolol, and Zonegran, Botox for headaches. She was not approved for Botox, and the expense was too high.  She was started on Seroquel.  Her JCV antibody index was negative 0.22. She had MRI  of the brain in July 2020 that showed nonspecific white matter changes, but she has a extensive history of severe migraine headache which could also explain the white matter changes.  MRI of the cervical spine shows moderate spinal stenosis at C6-7 level, without injury to the spinal cord.  Based on MRI findings, the diagnosis of MS is in question. She remains on Topamax for headaches, and was unable to tolerate Toradol.  She is pending a lumbar puncture to look for oligoclonal banding to help confirm the diagnosis of MS. She indicates on average she is having 25 days a month of headache.  She has not found anything to be beneficial.  She is taking Maxalt, but it does not completely relieve the headache.  She was unable to tolerate Toradol due to stomach upset.  She says over the last few months she had 2 falls while outside.  Her lumbar puncture was scheduled, but she canceled it.  She denies any changes to her bowels or bladder.  She reports over time she feels her vision is not as sharp.  She moved to [place] about 8 months ago from [place] with her husband. She lives in [place].  She has 3 grown children.  She is not currently employed.  She is able to operate a motor vehicle, but she does not do so often.  She presents today for follow-up unaccompanied.   REVIEW OF SYSTEMS: Out of a complete 14 system review of symptoms, the patient complains only  of the following symptoms, and all other reviewed systems are negative.  Headache, numbness,  ALLERGIES: Allergies  Allergen Reactions  . Percocet [Oxycodone-Acetaminophen] Nausea And Vomiting    HOME MEDICATIONS: Outpatient Medications Prior to Visit  Medication Sig Dispense Refill  . albuterol (VENTOLIN HFA) 108 (90 Base) MCG/ACT inhaler Inhale 3 puffs into the lungs every 6 (six) hours as needed for wheezing or shortness of breath. 3 g 1  . atorvastatin (LIPITOR) 20 MG tablet Take 1 tablet (20 mg total) by mouth daily. 90 tablet 1  . cetirizine  (ZYRTEC) 10 MG tablet Take 1 tablet (10 mg total) by mouth daily. 90 tablet 0  . clotrimazole-betamethasone (LOTRISONE) cream Apply 1 application topically as needed. 45 g 2  . dicyclomine (BENTYL) 20 MG tablet Take 1 tablet (20 mg total) by mouth 2 (two) times daily. 20 tablet 0  . Erenumab-aooe (AIMOVIG, 140 MG DOSE, [place]) Inject into the skin every 30 (thirty) days.     Marland Kitchen erythromycin ophthalmic ointment Place a 1/2 inch ribbon of ointment into the lower eyelid. 3.5 g 0  . esomeprazole (NEXIUM) 40 MG capsule Take 40 mg by mouth every morning.    Marland Kitchen estradiol (EVAMIST) 1.53 MG/SPRAY transdermal spray Place 3 sprays onto the skin daily.    Marland Kitchen ezetimibe (ZETIA) 10 MG tablet Take 20 mg by mouth daily.    Marland Kitchen HYDROcodone-acetaminophen (NORCO/VICODIN) 5-325 MG tablet Take 1 tablet by mouth every 4 (four) hours as needed. 5 tablet 0  . levothyroxine (SYNTHROID) 125 MCG tablet Take two tablets (250mg  total) by mouth once daily in the morning. 180 tablet 1  . lisinopril (ZESTRIL) 5 MG tablet Take 1 tablet (5 mg total) by mouth daily. 90 tablet 1  . metFORMIN (GLUCOPHAGE) 500 MG tablet Take by mouth 2 (two) times daily with a meal. Pt unsure of dose    . metoprolol tartrate (LOPRESSOR) 100 MG tablet Take 1 tablet (100 mg total) by mouth every evening. 90 tablet 1  . metroNIDAZOLE (METROGEL) 1 % gel Apply 1 application topically daily.    . montelukast (SINGULAIR) 10 MG tablet Take 10 mg by mouth every morning.    . Multiple Vitamins-Minerals (MULTIVITAMIN WITH MINERALS) tablet Take 1 tablet by mouth daily.    nystatin cream (MYCOSTATIN) Apply 1 application topically daily as needed for dry skin.    Marland Kitchen Olopatadine HCl (PAZEO) 0.7 % SOLN Apply to eye every morning.    . ondansetron (ZOFRAN) 8 MG tablet Take 8 mg by mouth as needed for nausea or vomiting.    . pantoprazole (PROTONIX) 20 MG tablet Take 20 mg by mouth daily.    . Red Yeast Rice Extract (RED YEAST RICE PO) Take 1 tablet by mouth daily.     .  Riboflavin (VITAMIN B-2 PO) Take by mouth daily.     . rizatriptan (MAXALT) 10 MG tablet Take 1 tablet (10 mg total) by mouth 3 (three) times daily as needed for migraine. May repeat in 2 hours if needed 27 tablet 1  . TRETINOIN EX Apply topically daily.    Marland Kitchen triamcinolone cream (KENALOG) 0.1 % Apply 1 application topically 2 (two) times daily as needed. 45 g 0  . triamterene-hydrochlorothiazide (MAXZIDE-25) 37.5-25 MG tablet Take 1 tablet by mouth daily. 90 tablet 1  . metoprolol tartrate (LOPRESSOR) 50 MG tablet Take 1 tablet (50 mg total) by mouth every morning. 90 tablet 1  . topiramate (TOPAMAX) 25 MG tablet TAKE 1 Tablet BY MOUTH ONCE NIGHTLY FOR  1 WEEK, THEN TAKE 2 Tablets  BY MOUTH ONCE NIGHTLY FOR 1 WEEK, THEN TAKE 3 Tablets  BY MOUTH ONCE NIGHTLY 90 tablet 1   No facility-administered medications prior to visit.    PAST MEDICAL HISTORY: Past Medical History:  Diagnosis Date  . Asthma   . Blood transfusion without reported diagnosis   . Chronic migraine   . Common migraine with intractable migraine 02/16/2019  . Endometriosis   . Fatty liver   . GERD (gastroesophageal reflux disease)   . Hyperlipidemia   . Hypertension   . Hypothyroidism   . Incontinence   . Irregular heartbeat   . Multiple sclerosis (HCC)   . Palpitations   . Pre-diabetes   . Sleep apnea     PAST SURGICAL HISTORY: Past Surgical History:  Procedure Laterality Date  . ABDOMINAL HYSTERECTOMY    . CESAREAN SECTION     x1  . CHOLECYSTECTOMY    . ENDOMETRIAL ABLATION    . FOOT SURGERY Left 06/2018   foot reconstruction  . REPLACEMENT TOTAL KNEE Left     FAMILY HISTORY: Family History  Problem Relation Age of Onset  . Heart attack Mother   . Stroke Mother   . Cancer Mother        breast cancer  . Hyperlipidemia Mother   . Hypothyroidism Mother   . Cancer Father        lymph nodes, liver cancer  . Other Daughter        Fine Gold Type II Syndrome and cognetive issues  . Memory loss Daughter    . Other Son        Fine Gold Syndrome and BPES  . Diabetes Son 22       IDDM  . Hypothyroidism Sister   . Cancer Sister   . Hyperthyroidism Neg Hx     SOCIAL HISTORY: Social History   Socioeconomic History  . Marital status: Married    Spouse name: Not on file  . Number of children: Not on file  . Years of education: 72  . Highest education level: Not on file  Occupational History  . Not on file  Tobacco Use  . Smoking status: Never Smoker  . Smokeless tobacco: Never Used  Substance and Sexual Activity  . Alcohol use: Not Currently    Comment: once a year  . Drug use: Never  . Sexual activity: Not on file  Other Topics Concern  . Not on file  Social History Narrative   Left handed    Caffeine  1 soda per day    Lives at home with husband and adult daughter   Social Determinants of Health   Financial Resource Strain:   . Difficulty of Paying Living Expenses: Not on file  Food Insecurity:   . Worried About Programme researcher, broadcasting/film/video in the Last Year: Not on file  . Ran Out of Food in the Last Year: Not on file  Transportation Needs:   . Lack of Transportation (Medical): Not on file  . Lack of Transportation (Non-Medical): Not on file  Physical Activity:   . Days of Exercise per Week: Not on file  . Minutes of Exercise per Session: Not on file  Stress:   . Feeling of Stress : Not on file  Social Connections:   . Frequency of Communication with Friends and Family: Not on file  . Frequency of Social Gatherings with Friends and Family: Not on file  . Attends Religious Services: Not on file  .  Active Member of Clubs or Organizations: Not on file  . Attends Banker Meetings: Not on file  . Marital Status: Not on file  Intimate Partner Violence:   . Fear of Current or Ex-Partner: Not on file  . Emotionally Abused: Not on file  . Physically Abused: Not on file  . Sexually Abused: Not on file   PHYSICAL EXAM  Vitals:   11/02/19 1314  BP: (!) 145/94   Pulse: 85  Temp: 97.7 F (36.5 C)  Weight: 230 lb 6.4 oz (104.5 kg)  Height:  (1.626 m)   Body mass index is 39.55 kg/m.  Generalized: Well developed, in no acute distress   Neurological examination  Mentation: Alert oriented to time, place, history taking. Follows all commands speech and language fluent.  Cranial nerve II-XII: Pupils were equal round reactive to light. Extraocular movements were full, visual field were full on confrontational test. Facial sensation and strength were normal. Head turning and shoulder shrug were normal and symmetric. Motor: The motor testing reveals 5 over 5 strength of all 4 extremities. Good symmetric motor tone is noted throughout.  Sensory: Sensory testing is intact to soft touch on all 4 extremities. No evidence of extinction is noted.  Coordination: Cerebellar testing reveals good finger-nose-finger and heel-to-shin bilaterally.  Gait and station: Able to rise from seated position without pushoff, slight limp on the left, tandem gait is unsteady.  Romberg is negative. Reflexes: Deep tendon reflexes are symmetric and normal bilaterally.   DIAGNOSTIC DATA (LABS, IMAGING, TESTING) - I reviewed patient records, labs, notes, testing and imaging myself where available.  Lab Results  Component Value Date   WBC 7.5 07/30/2019   HGB 13.6 07/30/2019   HCT 42.1 07/30/2019   MCV 91.5 07/30/2019   PLT 331 07/30/2019      Component Value Date/Time   NA 137 07/30/2019 1150   K 4.1 07/30/2019 1150   CL 105 07/30/2019 1150   CO2 23 07/30/2019 1150   GLUCOSE 143 (H) 07/30/2019 1150   BUN 16 07/30/2019 1150   CREATININE 0.75 07/30/2019 1150   CALCIUM 9.4 07/30/2019 1150   PROT 8.5 (H) 07/30/2019 1150   ALBUMIN 4.0 07/30/2019 1150   AST 44 (H) 07/30/2019 1150   ALT 53 (H) 07/30/2019 1150   ALKPHOS 117 07/30/2019 1150   BILITOT 0.6 07/30/2019 1150   GFRNONAA >60 07/30/2019 1150   GFRAA >60 07/30/2019 1150   Lab Results  Component Value Date    CHOL 246 (H) 02/24/2019   HDL 45 02/24/2019   LDLCALC 163 (H) 02/24/2019   TRIG 188 (H) 02/24/2019   CHOLHDL 5.5 02/24/2019   Lab Results  Component Value Date   HGBA1C 7.8 (H) 07/24/2019   No results found for: VITAMINB12 Lab Results  Component Value Date   TSH 0.81 07/24/2019    ASSESSMENT AND PLAN 49 y.o. year old female  has a past medical history of Asthma, Blood transfusion without reported diagnosis, Chronic migraine, Common migraine with intractable migraine (02/16/2019), Endometriosis, Fatty liver, GERD (gastroesophageal reflux disease), Hyperlipidemia, Hypertension, Hypothyroidism, Incontinence, Irregular heartbeat, Multiple sclerosis (HCC), Palpitations, Pre-diabetes, and Sleep apnea. here with:  1.  Multiple sclerosis -She is going to again try to get her records from her previous treatment at [facility] -She has canceled lumbar puncture x 2, is not interested -MRI of the brain has questioned diagnosis of MS, showing nonspecific white matter changes, could be related to longstanding history of severe migraine headaches -MRI of the cervical spine  shows moderate spinal stenosis at C6-7 level without injury to the spinal cord, no evidence of demyelination -She is not on any disease modifying therapy at this time -She will follow-up in 6 months or sooner if needed  2.  Intractable migraine headache -Headaches have improved with Aimovig 140 mg monthly injection -Now having 4-5 headaches a week, are less severe -She was able to get Aimovig through the [employer] safety net foundation -We were able to get her set up with [place] financial assistance, she may be able to get the Aimovig through this going forward -She will continue Maxalt as needed, I will give her diclofenac potassium to alternate for acute headache -She has previously done Botox, does not remember if it was beneficial, we will likely not be able to offer this due to the expense   I spent 25 minutes with the  patient. 50% of this time was spent discussing her plan of care.   Margie Ege, AGNP-C, DNP 11/02/2019, 1:55 PM [facility] [address] [place], [place] [address] [phone]

## 2019-11-02 NOTE — Patient Instructions (Signed)
Let's continue Aimovig injection, Maxalt as needed, try diclofenac potassium to take for acute headache to alter with Maxalt.   Try to get records from MS diagnosis   See you back in 6 months

## 2019-11-27 ENCOUNTER — Other Ambulatory Visit: Payer: Self-pay | Admitting: Physician Assistant

## 2019-11-30 ENCOUNTER — Telehealth: Payer: Self-pay | Admitting: Endocrinology

## 2019-11-30 ENCOUNTER — Ambulatory Visit: Payer: Medicaid Other | Admitting: Family Medicine

## 2019-11-30 NOTE — Telephone Encounter (Signed)
MEDICATION: synthroid  PHARMACY:  [facility] - [place], [place] - [address], [place] [address] Phone:  [phone]  Fax:  [phone]      IS THIS A 90 DAY SUPPLY : yes  IS PATIENT OUT OF MEDICATION: no  IF NOT; HOW MUCH IS LEFT: "just a few pills left"  LAST APPOINTMENT DATE: @11 /16/2020  NEXT APPOINTMENT DATE:@Visit  date not found  DO WE HAVE YOUR PERMISSION TO LEAVE A DETAILED MESSAGE: yes  OTHER COMMENTS:    **Let patient know to contact pharmacy at the end of the day to make sure medication is ready. **  ** Please notify patient to allow 48-72 hours to process**  **Encourage patient to contact the pharmacy for refills or they can request refills through [facility]**

## 2019-11-30 NOTE — Telephone Encounter (Signed)
She is supposed to be following up after her thyroid medication was changed.  Need to know if she is coming back here or not.  If she is planning to come back we can give her 30-day supply only

## 2019-11-30 NOTE — Telephone Encounter (Signed)
Last office note indicates that f/u is TBD. When does pt need to come back. And is it okay to refill medication?

## 2019-11-30 NOTE — Telephone Encounter (Signed)
Called pt and left detailed voicemail with MD message. Will wait on return phone call from pt.

## 2020-02-06 LAB — HM DIABETES EYE EXAM

## 2020-02-09 ENCOUNTER — Encounter: Payer: Self-pay | Admitting: Family Medicine

## 2020-02-12 ENCOUNTER — Emergency Department (HOSPITAL_COMMUNITY)
Admission: EM | Admit: 2020-02-12 | Discharge: 2020-02-13 | Disposition: A | Discharge status: Home / Self Care | Payer: No Typology Code available for payment source | Attending: Emergency Medicine | Admitting: Emergency Medicine

## 2020-02-12 ENCOUNTER — Encounter (HOSPITAL_COMMUNITY): Payer: Self-pay | Admitting: Emergency Medicine

## 2020-02-12 ENCOUNTER — Emergency Department (HOSPITAL_COMMUNITY): Payer: No Typology Code available for payment source

## 2020-02-12 ENCOUNTER — Other Ambulatory Visit: Payer: Self-pay

## 2020-02-12 ENCOUNTER — Telehealth: Payer: Self-pay | Admitting: Neurology

## 2020-02-12 DIAGNOSIS — M25551 Pain in right hip: Secondary | ICD-10-CM | POA: Diagnosis not present

## 2020-02-12 DIAGNOSIS — M545 Low back pain: Secondary | ICD-10-CM | POA: Diagnosis not present

## 2020-02-12 DIAGNOSIS — S161XXA Strain of muscle, fascia and tendon at neck level, initial encounter: Secondary | ICD-10-CM | POA: Insufficient documentation

## 2020-02-12 DIAGNOSIS — M79661 Pain in right lower leg: Secondary | ICD-10-CM | POA: Diagnosis not present

## 2020-02-12 DIAGNOSIS — G35 Multiple sclerosis: Secondary | ICD-10-CM | POA: Diagnosis not present

## 2020-02-12 DIAGNOSIS — I10 Essential (primary) hypertension: Secondary | ICD-10-CM | POA: Insufficient documentation

## 2020-02-12 DIAGNOSIS — Y939 Activity, unspecified: Secondary | ICD-10-CM | POA: Insufficient documentation

## 2020-02-12 DIAGNOSIS — Y929 Unspecified place or not applicable: Secondary | ICD-10-CM | POA: Insufficient documentation

## 2020-02-12 DIAGNOSIS — M546 Pain in thoracic spine: Secondary | ICD-10-CM | POA: Diagnosis not present

## 2020-02-12 DIAGNOSIS — E669 Obesity, unspecified: Secondary | ICD-10-CM | POA: Diagnosis not present

## 2020-02-12 DIAGNOSIS — Z79899 Other long term (current) drug therapy: Secondary | ICD-10-CM | POA: Diagnosis not present

## 2020-02-12 DIAGNOSIS — Z6836 Body mass index (BMI) 36.0-36.9, adult: Secondary | ICD-10-CM | POA: Insufficient documentation

## 2020-02-12 DIAGNOSIS — M25512 Pain in left shoulder: Secondary | ICD-10-CM | POA: Insufficient documentation

## 2020-02-12 DIAGNOSIS — R519 Headache, unspecified: Secondary | ICD-10-CM | POA: Insufficient documentation

## 2020-02-12 DIAGNOSIS — Y999 Unspecified external cause status: Secondary | ICD-10-CM | POA: Diagnosis not present

## 2020-02-12 LAB — CBC
HCT: 42.3 % (ref 36.0–46.0)
Hemoglobin: 13.8 g/dL (ref 12.0–15.0)
MCH: 30 pg (ref 26.0–34.0)
MCHC: 32.6 g/dL (ref 30.0–36.0)
MCV: 92 fL (ref 80.0–100.0)
Platelets: 286 10*3/uL (ref 150–400)
RBC: 4.6 MIL/uL (ref 3.87–5.11)
RDW: 12 % (ref 11.5–15.5)
WBC: 7.2 10*3/uL (ref 4.0–10.5)
nRBC: 0 % (ref 0.0–0.2)

## 2020-02-12 LAB — BASIC METABOLIC PANEL
Anion gap: 11 (ref 5–15)
BUN: 12 mg/dL (ref 6–20)
CO2: 20 mmol/L — ABNORMAL LOW (ref 22–32)
Calcium: 9.3 mg/dL (ref 8.9–10.3)
Chloride: 107 mmol/L (ref 98–111)
Creatinine, Ser: 0.97 mg/dL (ref 0.44–1.00)
GFR calc Af Amer: >60 mL/min (ref >60)
GFR calc non Af Amer: >60 mL/min (ref >60)
Glucose, Bld: 221 mg/dL — ABNORMAL HIGH (ref 70–99)
Potassium: 3.8 mmol/L (ref 3.5–5.1)
Sodium: 138 mmol/L (ref 135–145)
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
## 2020-02-12 MED ORDER — FENTANYL CITRATE (PF) 100 MCG/2ML IJ SOLN
50.0000 ug | INTRAMUSCULAR | Status: DC | PRN
  Administered 2020-02-12: 50 ug via NASAL
  Filled 2020-02-12: qty 2

## 2020-02-12 MED ORDER — ONDANSETRON 4 MG PO TBDP
4.0000 mg | ORAL_TABLET | Freq: Once | ORAL | Status: AC
  Administered 2020-02-12: 4 mg via ORAL
  Filled 2020-02-12: qty 1

## 2020-02-12 MED ORDER — SODIUM CHLORIDE 0.9 % IV BOLUS
1000.0000 mL | Freq: Once | INTRAVENOUS | Status: AC
  Administered 2020-02-13: 1000 mL via INTRAVENOUS

## 2020-02-12 MED ORDER — ONDANSETRON HCL 4 MG/2ML IJ SOLN
4.0000 mg | Freq: Once | INTRAMUSCULAR | Status: AC
  Administered 2020-02-13: 4 mg via INTRAVENOUS
  Filled 2020-02-12: qty 2

## 2020-02-12 NOTE — Telephone Encounter (Signed)
I called Amy Lester, pcp, pt seen for UTI and migraine (sharp pains in head).  She is taking aimovig, maxalt, diclofenac. I called pt and she noted sharpt pain head (come and go), vision changes, has see opthamology. L weakness.  ? Exacerbation.  Made appt tomorrow at 1415.  (be here 1400 had another appt earlier).  I okd.
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
## 2020-02-13 ENCOUNTER — Telehealth: Payer: Self-pay

## 2020-02-13 ENCOUNTER — Emergency Department (HOSPITAL_COMMUNITY): Payer: No Typology Code available for payment source

## 2020-02-13 ENCOUNTER — Ambulatory Visit: Payer: Self-pay | Admitting: Neurology

## 2020-02-13 LAB — COMPREHENSIVE METABOLIC PANEL
ALT: 97 U/L — ABNORMAL HIGH (ref 0–44)
AST: 70 U/L — ABNORMAL HIGH (ref 15–41)
Albumin: 3.8 g/dL (ref 3.5–5.0)
Alkaline Phosphatase: 111 U/L (ref 38–126)
Anion gap: 12 (ref 5–15)
BUN: 13 mg/dL (ref 6–20)
CO2: 21 mmol/L — ABNORMAL LOW (ref 22–32)
Calcium: 9.2 mg/dL (ref 8.9–10.3)
Chloride: 107 mmol/L (ref 98–111)
Creatinine, Ser: 0.76 mg/dL (ref 0.44–1.00)
GFR calc Af Amer: >60 mL/min (ref >60)
GFR calc non Af Amer: >60 mL/min (ref >60)
Glucose, Bld: 158 mg/dL — ABNORMAL HIGH (ref 70–99)
Potassium: 3.9 mmol/L (ref 3.5–5.1)
Sodium: 140 mmol/L (ref 135–145)
Total Bilirubin: 0.2 mg/dL — ABNORMAL LOW (ref 0.3–1.2)
Total Protein: 8 g/dL (ref 6.5–8.1)

## 2020-02-13 LAB — CBC WITH DIFFERENTIAL/PLATELET
Abs Immature Granulocytes: 0.03 10*3/uL (ref 0.00–0.07)
Basophils Absolute: 0 10*3/uL (ref 0.0–0.1)
Basophils Relative: 1 %
Eosinophils Absolute: 0.2 10*3/uL (ref 0.0–0.5)
Eosinophils Relative: 3 %
HCT: 41.5 % (ref 36.0–46.0)
Hemoglobin: 13.6 g/dL (ref 12.0–15.0)
Immature Granulocytes: 0 %
Lymphocytes Relative: 33 %
Lymphs Abs: 2.6 10*3/uL (ref 0.7–4.0)
MCH: 30.1 pg (ref 26.0–34.0)
MCHC: 32.8 g/dL (ref 30.0–36.0)
MCV: 91.8 fL (ref 80.0–100.0)
Monocytes Absolute: 0.6 10*3/uL (ref 0.1–1.0)
Monocytes Relative: 8 %
Neutro Abs: 4.4 10*3/uL (ref 1.7–7.7)
Neutrophils Relative %: 55 %
Platelets: 304 10*3/uL (ref 150–400)
RBC: 4.52 MIL/uL (ref 3.87–5.11)
RDW: 12.1 % (ref 11.5–15.5)
WBC: 8 10*3/uL (ref 4.0–10.5)
nRBC: 0 % (ref 0.0–0.2)

## 2020-02-13 LAB — CBG MONITORING, ED: Glucose-Capillary: 149 mg/dL — ABNORMAL HIGH (ref 70–99)
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
## 2020-02-13 MED ORDER — KETOROLAC TROMETHAMINE 30 MG/ML IJ SOLN
15.0000 mg | Freq: Once | INTRAMUSCULAR | Status: AC
  Administered 2020-02-13: 15 mg via INTRAVENOUS
  Filled 2020-02-13: qty 1

## 2020-02-13 MED ORDER — METHOCARBAMOL 750 MG PO TABS
750.0000 mg | ORAL_TABLET | Freq: Two times a day (BID) | ORAL | 0 refills | Status: DC | PRN
Start: 2020-02-13 — End: 2020-04-19

## 2020-02-13 MED ORDER — SODIUM CHLORIDE 0.9 % IV BOLUS (SEPSIS)
1000.0000 mL | Freq: Once | INTRAVENOUS | Status: AC
  Administered 2020-02-13: 1000 mL via INTRAVENOUS

## 2020-02-13 MED ORDER — LIDOCAINE 5 % EX PTCH: 1.0000 | MEDICATED_PATCH | CUTANEOUS | 0 refills | Status: DC

## 2020-02-13 MED ORDER — MORPHINE SULFATE (PF) 4 MG/ML IV SOLN
4.0000 mg | Freq: Once | INTRAVENOUS | Status: AC
  Administered 2020-02-13: 4 mg via INTRAVENOUS
  Filled 2020-02-13: qty 1

## 2020-02-13 MED ORDER — HYDROCODONE-ACETAMINOPHEN 5-325 MG PO TABS: 1.0000 | ORAL_TABLET | Freq: Four times a day (QID) | ORAL | 0 refills | Status: DC | PRN

## 2020-02-13 MED ORDER — ONDANSETRON 4 MG PO TBDP
4.0000 mg | ORAL_TABLET | Freq: Three times a day (TID) | ORAL | 0 refills | Status: DC | PRN
Start: 2020-02-13 — End: 2020-10-29

## 2020-02-13 NOTE — Discharge Instructions (Addendum)
Expect your soreness to increase over the next 2-3 days. Take it easy, but do not lay around too much as this may make any stiffness worse.  Antiinflammatory medications: Take 600 mg of ibuprofen every 6 hours or 440 mg (over the counter dose) to 500 mg (prescription dose) of naproxen every 12 hours for the next 3 days. After this time, these medications may be used as needed for pain. Take these medications with food to avoid upset stomach. Choose only one of these medications, do not take them together. Acetaminophen (generic for Tylenol): Should you continue to have additional pain while taking the ibuprofen or naproxen, you may add in acetaminophen as needed. Your daily total maximum amount of acetaminophen from all sources should be limited to 4000mg /day for persons without liver problems, or 2000mg /day for those with liver problems. Methocarbamol: Methocarbamol (generic for Robaxin) is a muscle relaxer and can help relieve stiff muscles or muscle spasms.  Do not drive or perform other dangerous activities while taking this medication as it can cause drowsiness as well as changes in reaction time and judgement. Lidocaine patches: These are available via either prescription or over-the-counter. The over-the-counter option may be more economical one and are likely just as effective. There are multiple over-the-counter brands, such as Salonpas. Ice: May apply ice to the area over the next 24 hours for 15 minutes at a time to reduce pain, inflammation, and swelling, if present. Exercises: Be sure to perform the attached exercises starting with three times a week and working up to performing them daily. This is an essential part of preventing long term problems.  Follow up: Follow up with a primary care provider for any future management of these complaints. Be sure to follow up within 7-10 days. Return: Return to the ED should symptoms worsen.  For prescription assistance, may try using prescription  discount sites or apps, such as goodrx.com   Head Injury You have been seen today for a head injury, which may or may not have resulted in a concussion. It does not appear to be serious at this time, however, it is important to note that your presentation today is not necessarily an indication of the severity of future symptoms.  Expected symptoms: Expected symptoms of concussion and/or head injury can include nausea, headache, mild dizziness (should still be able to get up and walk around without difficulty), difficulty concentrating, increased sleep, difficulty sleeping, increased intensity of emotions. Close observation: The close observation period is usually 6 hours from the injury. This includes staying awake and having a trustworthy adult monitor you to assure your condition does not worsen. You should be in regular contact with this person and ideally, they should be able to monitor you in person.  Secondary observation: The secondary observation period is usually 24 hours from the injury. You are allowed to sleep during this time. A trustworthy adult should intermittently monitor you to assure your condition does not worsen.   Overall head injury/concussion care: Rest: Be sure to get plenty of rest. You will need more rest and sleep while you recover. Hydration: Be sure to stay well hydrated by having a goal of drinking about 0.5 liters of water an hour. Pain:  Antiinflammatory medications: Take 600 mg of ibuprofen every 6 hours or 440 mg (over the counter dose) to 500 mg (prescription dose) of naproxen every 12 hours or for the next 3 days. After this time, these medications may be used as needed for pain. Take these medications with  food to avoid upset stomach. Choose only one of these medications, do not take them together. Tylenol: Should you continue to have additional pain while taking the ibuprofen or naproxen, you may add in tylenol as needed. Your daily total maximum amount of tylenol  from all sources should be limited to 4000mg/day for persons without liver problems, or 2000mg/day for those with liver problems. Return to sports and activities: In general, you may return to normal activities once symptoms have subsided, however, you would ideally be cleared by a primary care provider or other qualified medical professional prior to return to these activities.  Follow up: Follow up with the concussion clinic or your primary care provider for further management of this issue. Return: Return to the ED should you begin to have confusion, abnormal behavior, aggression, violence, or personality changes, repeated vomiting, vision loss, numbness or weakness on one side of the body, difficulty standing due to dizziness, significantly worsening pain, or any other major concerns. 
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
## 2020-02-13 NOTE — Progress Notes (Deleted)
PATIENT: Amy Lester DOB: May 28, 1971  REASON FOR VISIT: follow up HISTORY FROM: patient  HISTORY OF PRESENT ILLNESS: Today 02/13/20  Amy Lester 49 year old female with history of multiple sclerosis.  She indicates she was diagnosed with MS about 9 years ago at [facility], based on MRI findings, diagnosis of MS is in question, nonspecific white matter changes could be explained by longstanding history of severe migraine headaches.  She has canceled her appointment for lumbar puncture several times to confirm diagnosis of MS.  She takes Aimovig for migraines.  She was able to get home health financial assistance.  HISTORY 11/02/2019 SS: Amy Lester is a 49 year old female with history of multiple sclerosis.  She indicates she was diagnosed with MS about 9 years ago at [place] [facility].  Based on MRI findings, diagnosis of MS is in question, nonspecific white matter changes could be explained by longstanding history of severe migraine headaches.  She has canceled her appointment for lumbar puncture twice to try and confirm diagnosis of MS.  She was able to receive [facility] patient assistance.  She has gotten Aimovig through the [employer].  She has had 3 injections, her headache frequency decreased, they are less intense.  She has headache 4 to 5 days a week, but was severe.  She reports Maxalt is beneficial.  With her severe headaches, she may see glitter in her vision.  When she has a headache, she struggles to get her thoughts together and focus.  She has stopped Topamax.  She has issues with dropping things in her hands.  Occasionally, her left hand may tremble.  She denies bowel or bladder incontinence, or changes to vision.  She presents today for evaluation unaccompanied.   REVIEW OF SYSTEMS: Out of a complete 14 system review of symptoms, the patient complains only of the following symptoms, and all other reviewed systems are negative.  ALLERGIES: Allergies  Allergen Reactions  . Percocet  [Oxycodone-Acetaminophen] Nausea And Vomiting    HOME MEDICATIONS: Outpatient Medications Prior to Visit  Medication Sig Dispense Refill  . albuterol (VENTOLIN HFA) 108 (90 Base) MCG/ACT inhaler Inhale 3 puffs into the lungs every 6 (six) hours as needed for wheezing or shortness of breath. 3 g 1  . atorvastatin (LIPITOR) 20 MG tablet Take 1 tablet (20 mg total) by mouth daily. 90 tablet 1  . cetirizine (ZYRTEC) 10 MG tablet Take 1 tablet (10 mg total) by mouth daily. 90 tablet 0  . clotrimazole-betamethasone (LOTRISONE) cream Apply 1 application topically as needed. (Patient taking differently: Apply 1 application topically as needed (Outbreak). ) 45 g 2  . diclofenac (CATAFLAM) 50 MG tablet Take 1 tablet (50 mg total) by mouth 2 (two) times daily as needed. 20 tablet 1  . dicyclomine (BENTYL) 20 MG tablet Take 1 tablet (20 mg total) by mouth 2 (two) times daily. (Patient taking differently: Take 20 mg by mouth 2 (two) times daily as needed. ) 20 tablet 0  . Erenumab-aooe (AIMOVIG, 140 MG DOSE, ) Inject into the skin every 30 (thirty) days.     Marland Kitchen erythromycin ophthalmic ointment Place a 1/2 inch ribbon of ointment into the lower eyelid. (Patient not taking: Reported on 02/13/2020) 3.5 g 0  . esomeprazole (NEXIUM) 40 MG capsule Take 40 mg by mouth every morning.    Marland Kitchen estradiol (EVAMIST) 1.53 MG/SPRAY transdermal spray Place 3 sprays onto the skin daily.    Marland Kitchen ezetimibe (ZETIA) 10 MG tablet Take 10 mg by mouth daily.     Marland Kitchen  HYDROcodone-acetaminophen (NORCO/VICODIN) 5-325 MG tablet Take 1-2 tablets by mouth every 6 (six) hours as needed for severe pain. 10 tablet 0  . LEVEMIR FLEXTOUCH 100 UNIT/ML FlexPen Inject 15 Units into the skin in the morning and at bedtime.    Marland Kitchen levothyroxine (SYNTHROID) 125 MCG tablet Take two tablets (250mg  total) by mouth once daily in the morning. 180 tablet 1  . lidocaine (LIDODERM) 5 % Place 1 patch onto the skin daily. Remove & Discard patch within 12 hours or as  directed by MD 30 patch 0  . lisinopril (ZESTRIL) 5 MG tablet Take 1 tablet (5 mg total) by mouth daily. 90 tablet 1  . metFORMIN (GLUCOPHAGE) 500 MG tablet Take 500 mg by mouth 2 (two) times daily with a meal.     . methocarbamol (ROBAXIN) 750 MG tablet Take 1 tablet (750 mg total) by mouth 2 (two) times daily as needed for muscle spasms (or muscle tightness). 20 tablet 0  . metoprolol tartrate (LOPRESSOR) 100 MG tablet Take 1 tablet (100 mg total) by mouth every evening. (Patient not taking: Reported on 02/13/2020) 90 tablet 1  . metoprolol tartrate (LOPRESSOR) 25 MG tablet Take 25 mg by mouth every evening.    . metoprolol tartrate (LOPRESSOR) 50 MG tablet Take 50 mg by mouth in the morning.    . metroNIDAZOLE (METROGEL) 1 % gel Apply 1 application topically at bedtime.     . montelukast (SINGULAIR) 10 MG tablet Take 10 mg by mouth every morning.    . Multiple Vitamins-Minerals (MULTIVITAMIN WITH MINERALS) tablet Take 1 tablet by mouth daily.    04/14/2020 nystatin cream (MYCOSTATIN) Apply 1 application topically daily as needed for dry skin.    Marland Kitchen Olopatadine HCl (PATADAY OP) Apply 2 drops to eye daily.    . ondansetron (ZOFRAN ODT) 4 MG disintegrating tablet Take 1 tablet (4 mg total) by mouth every 8 (eight) hours as needed for nausea or vomiting. 20 tablet 0  . ondansetron (ZOFRAN) 8 MG tablet Take 8 mg by mouth as needed for nausea or vomiting.    . pantoprazole (PROTONIX) 20 MG tablet Take 20 mg by mouth daily.    . Red Yeast Rice Extract (RED YEAST RICE PO) Take 1 tablet by mouth daily.     . Riboflavin (VITAMIN B-2 PO) Take 1 tablet by mouth daily.     . rizatriptan (MAXALT) 10 MG tablet Take 1 tablet (10 mg total) by mouth 3 (three) times daily as needed for migraine. May repeat in 2 hours if needed 27 tablet 1  . TRETINOIN EX Apply topically daily.    Marland Kitchen triamcinolone cream (KENALOG) 0.1 % Apply 1 application topically 2 (two) times daily as needed. (Patient taking differently: Apply 1 application  topically 2 (two) times daily as needed (rash). ) 45 g 0  . triamterene-hydrochlorothiazide (MAXZIDE-25) 37.5-25 MG tablet Take 1 tablet by mouth daily. 90 tablet 1   Facility-Administered Medications Prior to Visit  Medication Dose Route Frequency Provider Last Rate Last Admin  . fentaNYL (SUBLIMAZE) injection 50 mcg  50 mcg Nasal Q20 Min PRN 06-13-1992, MD   50 mcg at 02/12/20 2126    PAST MEDICAL HISTORY: Past Medical History:  Diagnosis Date  . Asthma   . Blood transfusion without reported diagnosis   . Chronic migraine   . Common migraine with intractable migraine 02/16/2019  . Endometriosis   . Fatty liver   . GERD (gastroesophageal reflux disease)   . Hyperlipidemia   . Hypertension   .  Hypothyroidism   . Incontinence   . Irregular heartbeat   . Multiple sclerosis ([place])   . Palpitations   . Pre-diabetes   . Sleep apnea     PAST SURGICAL HISTORY: Past Surgical History:  Procedure Laterality Date  . ABDOMINAL HYSTERECTOMY    . CESAREAN SECTION     x1  . CHOLECYSTECTOMY    . ENDOMETRIAL ABLATION    . FOOT SURGERY Left 06/2018   foot reconstruction  . REPLACEMENT TOTAL KNEE Left     FAMILY HISTORY: Family History  Problem Relation Age of Onset  . Heart attack Mother   . Stroke Mother   . Cancer Mother        breast cancer  . Hyperlipidemia Mother   . Hypothyroidism Mother   . Cancer Father        lymph nodes, liver cancer  . Other Daughter        Fine Gold Type II Syndrome and cognetive issues  . Memory loss Daughter   . Other Son        Fine Gold Syndrome and BPES  . Diabetes Son 22       IDDM  . Hypothyroidism Sister   . Cancer Sister   . Hyperthyroidism Neg Hx     SOCIAL HISTORY: Social History   Socioeconomic History  . Marital status: Married    Spouse name: Not on file  . Number of children: Not on file  . Years of education: 49  . Highest education level: Not on file  Occupational History  . Not on file  Tobacco Use  .  Smoking status: Never Smoker  . Smokeless tobacco: Never Used  Substance and Sexual Activity  . Alcohol use: Not Currently    Comment: once a year  . Drug use: Never  . Sexual activity: Not on file  Other Topics Concern  . Not on file  Social History Narrative   Left handed    Caffeine  1 soda per day    Lives at home with husband and adult daughter   Social Determinants of Health   Financial Resource Strain:   . Difficulty of Paying Living Expenses:   Food Insecurity:   . Worried About Charity fundraiser in the Last Year:   . Arboriculturist in the Last Year:   Transportation Needs:   . Film/video editor (Medical):   Marland Kitchen Lack of Transportation (Non-Medical):   Physical Activity:   . Days of Exercise per Week:   . Minutes of Exercise per Session:   Stress:   . Feeling of Stress :   Social Connections:   . Frequency of Communication with Friends and Family:   . Frequency of Social Gatherings with Friends and Family:   . Attends Religious Services:   . Active Member of Clubs or Organizations:   . Attends Archivist Meetings:   Marland Kitchen Marital Status:   Intimate Partner Violence:   . Fear of Current or Ex-Partner:   . Emotionally Abused:   Marland Kitchen Physically Abused:   . Sexually Abused:       PHYSICAL EXAM  There were no vitals filed for this visit. There is no height or weight on file to calculate BMI.  Generalized: Well developed, in no acute distress   Neurological examination  Mentation: Alert oriented to time, place, history taking. Follows all commands speech and language fluent Cranial nerve II-XII: Pupils were equal round reactive to light. Extraocular movements were full,  visual field were full on confrontational test. Facial sensation and strength were normal. Uvula tongue midline. Head turning and shoulder shrug  were normal and symmetric. Motor: The motor testing reveals 5 over 5 strength of all 4 extremities. Good symmetric motor tone is noted  throughout.  Sensory: Sensory testing is intact to soft touch on all 4 extremities. No evidence of extinction is noted.  Coordination: Cerebellar testing reveals good finger-nose-finger and heel-to-shin bilaterally.  Gait and station: Gait is normal. Tandem gait is normal. Romberg is negative. No drift is seen.  Reflexes: Deep tendon reflexes are symmetric and normal bilaterally.   DIAGNOSTIC DATA (LABS, IMAGING, TESTING) - I reviewed patient records, labs, notes, testing and imaging myself where available.  Lab Results  Component Value Date   WBC 8.0 02/13/2020   HGB 13.6 02/13/2020   HCT 41.5 02/13/2020   MCV 91.8 02/13/2020   PLT 304 02/13/2020      Component Value Date/Time   NA 140 02/13/2020 0011   K 3.9 02/13/2020 0011   CL 107 02/13/2020 0011   CO2 21 (L) 02/13/2020 0011   GLUCOSE 158 (H) 02/13/2020 0011   BUN 13 02/13/2020 0011   CREATININE 0.76 02/13/2020 0011   CALCIUM 9.2 02/13/2020 0011   PROT 8.0 02/13/2020 0011   ALBUMIN 3.8 02/13/2020 0011   AST 70 (H) 02/13/2020 0011   ALT 97 (H) 02/13/2020 0011   ALKPHOS 111 02/13/2020 0011   BILITOT 0.2 (L) 02/13/2020 0011   GFRNONAA >60 02/13/2020 0011   GFRAA >60 02/13/2020 0011   Lab Results  Component Value Date   CHOL 246 (H) 02/24/2019   HDL 45 02/24/2019   LDLCALC 163 (H) 02/24/2019   TRIG 188 (H) 02/24/2019   CHOLHDL 5.5 02/24/2019   Lab Results  Component Value Date   HGBA1C 7.8 (H) 07/24/2019   No results found for: VITAMINB12 Lab Results  Component Value Date   TSH 0.81 07/24/2019      ASSESSMENT AND PLAN 49 y.o. year old female  has a past medical history of Asthma, Blood transfusion without reported diagnosis, Chronic migraine, Common migraine with intractable migraine (02/16/2019), Endometriosis, Fatty liver, GERD (gastroesophageal reflux disease), Hyperlipidemia, Hypertension, Hypothyroidism, Incontinence, Irregular heartbeat, Multiple sclerosis ([facility]), Palpitations, Pre-diabetes, and Sleep  apnea. here with ***   I spent 15 minutes with the patient. 50% of this time was spent   Margie Ege, [place], DNP 02/13/2020, 5:53 AM [facility] [address], [address] [place], [place] [address] [phone]

## 2020-02-13 NOTE — ED Provider Notes (Signed)
Patient seen/examined in the Emergency Department in conjunction with Midlevel Provider Joy Patient reports pain in right hip, right shin and left shoulder.  Imaging negative.  Exam : awake/alert, she can fully range all extremities without difficulty.  She reports dizziness Plan: all imaging negative.  Pt is in no distress.  She reports dizziness likely related to concussion.  Will give IV fluids and d/c home.  May need crutches, but no signs of occult/traumatic injury    Zadie Rhine, MD 02/13/20 [phone]

## 2020-02-13 NOTE — ED Notes (Signed)
Patient stated she did not have a ride home and had no family or friends that lived in close distance to pick her up. Patient was made aware that she could call a taxi however she did not have any cash. Patient was made aware that she could not stay in the room because she was medically clear and she could wait in the lobby. Patient wants to talk to a Child psychotherapist. Patient was made aware that the social worker would not be here until around 830am and would contact her in the waiting room.

## 2020-02-13 NOTE — Telephone Encounter (Signed)
Holding appt 02-20-20 at 1345.

## 2020-02-13 NOTE — ED Notes (Signed)
Went to ambulate pt. Pt was historical that she didn't try to put her right foot to the floor to see if she was able to walk on it. Pt stated that she gets dizzy when she stand, she feels like she is going to fall.

## 2020-02-14 NOTE — Telephone Encounter (Signed)
I called pt and tle her that I had openings this afternoon or 02-20-20 at 1345.  She was not able to come due to no transporation for today and had another appt with MD in [place].  Will have to call her with another appt when cancellation with SS/NP. Pt was agreeable,

## 2020-02-19 ENCOUNTER — Ambulatory Visit: Payer: Medicaid Other | Admitting: Endocrinology

## 2020-02-19 ENCOUNTER — Ambulatory Visit (INDEPENDENT_AMBULATORY_CARE_PROVIDER_SITE_OTHER): Payer: Self-pay | Admitting: Endocrinology

## 2020-02-19 ENCOUNTER — Encounter: Payer: Self-pay | Admitting: Endocrinology

## 2020-02-19 ENCOUNTER — Other Ambulatory Visit: Payer: Self-pay

## 2020-02-19 VITALS — BP 140/100 | HR 68 | Ht 64.0 in | Wt 209.2 lb

## 2020-02-19 DIAGNOSIS — E063 Autoimmune thyroiditis: Secondary | ICD-10-CM

## 2020-02-19 DIAGNOSIS — E1165 Type 2 diabetes mellitus with hyperglycemia: Secondary | ICD-10-CM

## 2020-02-19 DIAGNOSIS — E119 Type 2 diabetes mellitus without complications: Secondary | ICD-10-CM

## 2020-02-19 LAB — POCT GLYCOSYLATED HEMOGLOBIN (HGB A1C): Hemoglobin A1C: 9.5 % — AB (ref 4.0–5.6)

## 2020-02-19 NOTE — Patient Instructions (Addendum)
Check blood sugars on waking up 7 days a week  Also check blood sugars about 2 hours after meals and do this after different meals by rotation  Recommended blood sugar levels on waking up are 90-130 and about 2 hours after meal is 130-160  Please bring your blood sugar monitor to each visit, thank you  Start OZEMPIC injections by dialing 0.25 mg on the pen as shown once weekly on the same day of the week.   You may inject in the sides of the stomach, outer thigh or arm as indicated in the brochure given. If you have any difficulties using the pen see the video at [email or web address]  You will feel fullness of the stomach with starting the medication and should try to keep the portions at meals small.  You may experience nausea in the first few days which usually gets better over time    After 4 weeks increase the dose to 0.5 mg weekly  If you have any questions or persistent side effects please call the office   You may also talk to a nurse educator with [employer] at [phone] Useful website: [email or web address]      

## 2020-02-19 NOTE — Progress Notes (Signed)
Patient ID: Amy Lester, female   DOB: [birth year], 49 y.o.   MRN: [id number]           Reason for Appointment: High blood sugars  Referring PCP: Abbe Amsterdam   History of Present Illness:          Date of diagnosis of type 2 diabetes mellitus:   2018      Background history:   She apparently had been told to have prediabetes prior to diagnosis of diabetes.  However she did not have gestational diabetes with her 3 pregnancies She remembers her A1c being 9% at the time of diagnosis but records are not available She was tried on Metformin but this caused abdominal discomfort and she did not take this and was not given any other treatment She thinks her blood sugars improved with better diet  Recent history:    Most recent A1c is reportedly 11.4 done in April 2021 It is now 9.5        Non-insulin hypoglycemic drugs the patient is taking are: Metformin 500 mg twice daily  INSULIN regimen: Levemir 15 units twice daily   Current management, blood sugar patterns and problems identified:  She has not come back for follow-up since 11/20 when she was first seen with an A1c of 7.8  She was also recommended Invokana but she did not start it because of fear of side effects  Although she thought she was feeling fairly good when she went to her clinic about 6 weeks ago her sugars were significantly high and reportedly over 300  She was given a sample of Levemir insulin and is taking 15 units twice daily  Also started checking her sugars mostly twice a day with a generic monitor  She does not keep a record of her sugars  However she thinks her blood sugars are only slightly better with readings about 220-250 in the morning and up to 280 at bedtime, previously in the low 300 range  Although she does not complain of feeling weak or tired she was having increased thirst and urination which is better now  She appears to have lost weight, about 20 pounds        Side effects from  medications have been: Abdominal distress from Metformin              Exercise:  Limited by left ankle pain  Glucose monitoring:  done 0-1 times a day         Glucometer:  Generic    Blood Glucose readings by recall recently as above:   Dietician visit, most recent: Never  Weight history:  Wt Readings from Last 3 Encounters:  02/19/20 209 lb 3.2 oz (94.9 kg)  02/12/20 210 lb (95.3 kg)  11/02/19 230 lb 6.4 oz (104.5 kg)    Glycemic control:   Lab Results  Component Value Date   HGBA1C 9.5 (A) 02/19/2020   HGBA1C 7.8 (H) 07/24/2019   HGBA1C 6.4 (A) 04/12/2019   Lab Results  Component Value Date   MICROALBUR 0.9 07/24/2019   LDLCALC 163 (H) 02/24/2019   CREATININE 0.76 02/13/2020   Lab Results  Component Value Date   MICRALBCREAT 0.8 07/24/2019    No results found for: FRUCTOSAMINE  HYPOTHYROIDISM: See review of systems    Office Visit on 02/19/2020  Component Date Value Ref Range Status  . Hemoglobin A1C 02/19/2020 9.5* 4.0 - 5.6 % Final  Admission on 02/12/2020, Discharged on 02/13/2020  Component Date Value Ref Range Status  .  WBC 02/12/2020 7.2  4.0 - 10.5 K/uL Final  . RBC 02/12/2020 4.60  3.87 - 5.11 MIL/uL Final  . Hemoglobin 02/12/2020 13.8  12.0 - 15.0 g/dL Final  . HCT 20/94/7096 42.3  36 - 46 % Final  . MCV 02/12/2020 92.0  80.0 - 100.0 fL Final  . MCH 02/12/2020 30.0  26.0 - 34.0 pg Final  . MCHC 02/12/2020 32.6  30.0 - 36.0 g/dL Final  . RDW 28/36/6294 12.0  11.5 - 15.5 % Final  . Platelets 02/12/2020 286  150 - 400 K/uL Final  . nRBC 02/12/2020 0.0  0.0 - 0.2 % Final   Performed at [facility], [address] [place], [place] [address]  . Sodium 02/12/2020 138  135 - 145 mmol/L Final  . Potassium 02/12/2020 3.8  3.5 - 5.1 mmol/L Final  . Chloride 02/12/2020 107  98 - 111 mmol/L Final  . CO2 02/12/2020 20* 22 - 32 mmol/L Final  . Glucose, Bld 02/12/2020 221* 70 - 99 mg/dL Final   Glucose reference range applies only to samples taken  after fasting for at least 8 hours.  . BUN 02/12/2020 12  6 - 20 mg/dL Final  . Creatinine, Ser 02/12/2020 0.97  0.44 - 1.00 mg/dL Final  . Calcium 50/35/4656 9.3  8.9 - 10.3 mg/dL Final  . GFR calc non Af Amer 02/12/2020 >60  >60 mL/min Final  . GFR calc Af Amer 02/12/2020 >60  >60 mL/min Final  . Anion gap 02/12/2020 11  5 - 15 Final   Performed at [facility], [address] [place], [place] [address]  . Glucose-Capillary 02/13/2020 149* 70 - 99 mg/dL Final   Glucose reference range applies only to samples taken after fasting for at least 8 hours.  . Sodium 02/13/2020 140  135 - 145 mmol/L Final  . Potassium 02/13/2020 3.9  3.5 - 5.1 mmol/L Final  . Chloride 02/13/2020 107  98 - 111 mmol/L Final  . CO2 02/13/2020 21* 22 - 32 mmol/L Final  . Glucose, Bld 02/13/2020 158* 70 - 99 mg/dL Final   Glucose reference range applies only to samples taken after fasting for at least 8 hours.  . BUN 02/13/2020 13  6 - 20 mg/dL Final  . Creatinine, Ser 02/13/2020 0.76  0.44 - 1.00 mg/dL Final  . Calcium 17/00/1749 9.2  8.9 - 10.3 mg/dL Final  . Total Protein 02/13/2020 8.0  6.5 - 8.1 g/dL Final  . Albumin 44/96/7591 3.8  3.5 - 5.0 g/dL Final  . AST 63/84/6659 70* 15 - 41 U/L Final  . ALT 02/13/2020 97* 0 - 44 U/L Final  . Alkaline Phosphatase 02/13/2020 111  38 - 126 U/L Final  . Total Bilirubin 02/13/2020 0.2* 0.3 - 1.2 mg/dL Final  . GFR calc non Af Amer 02/13/2020 >60  >60 mL/min Final  . GFR calc Af Amer 02/13/2020 >60  >60 mL/min Final  . Anion gap 02/13/2020 12  5 - 15 Final   Performed at [facility], [address] [place], [place] [address]  . WBC 02/13/2020 8.0  4.0 - 10.5 K/uL Final  . RBC 02/13/2020 4.52  3.87 - 5.11 MIL/uL Final  . Hemoglobin 02/13/2020 13.6  12.0 - 15.0 g/dL Final  . HCT 17/79/3903 41.5  36 - 46 % Final  . MCV 02/13/2020 91.8  80.0 - 100.0 fL Final  . MCH 02/13/2020 30.1  26.0 - 34.0 pg Final  . MCHC 02/13/2020 32.8  30.0 - 36.0 g/dL Final  . RDW  04/54/0981 12.1  11.5 - 15.5 % Final  . Platelets 02/13/2020 304  150 - 400 K/uL Final  . nRBC 02/13/2020 0.0  0.0 - 0.2 % Final  . Neutrophils Relative % 02/13/2020 55  % Final  . Neutro Abs 02/13/2020 4.4  1.7 - 7.7 K/uL Final  . Lymphocytes Relative 02/13/2020 33  % Final  . Lymphs Abs 02/13/2020 2.6  0.7 - 4.0 K/uL Final  . Monocytes Relative 02/13/2020 8  % Final  . Monocytes Absolute 02/13/2020 0.6  0 - 1 K/uL Final  . Eosinophils Relative 02/13/2020 3  % Final  . Eosinophils Absolute 02/13/2020 0.2  0 - 0 K/uL Final  . Basophils Relative 02/13/2020 1  % Final  . Basophils Absolute 02/13/2020 0.0  0 - 0 K/uL Final  . Immature Granulocytes 02/13/2020 0  % Final  . Abs Immature Granulocytes 02/13/2020 0.03  0.00 - 0.07 K/uL Final   Performed at [facility], [address] [place], [place] [address]    Allergies as of 02/19/2020      Reactions   Percocet [oxycodone-acetaminophen] Nausea And Vomiting      Medication List       Accurate as of February 19, 2020  4:58 PM. If you have any questions, ask your nurse or doctor.        AIMOVIG (140 MG DOSE) [place] Inject into the skin every 30 (thirty) days.   albuterol 108 (90 Base) MCG/ACT inhaler Commonly known as: VENTOLIN HFA Inhale 3 puffs into the lungs every 6 (six) hours as needed for wheezing or shortness of breath.   atorvastatin 20 MG tablet Commonly known as: LIPITOR Take 1 tablet (20 mg total) by mouth daily.   cetirizine 10 MG tablet Commonly known as: ZYRTEC Take 1 tablet (10 mg total) by mouth daily.   clotrimazole-betamethasone cream Commonly known as: Lotrisone Apply 1 application topically as needed. What changed: reasons to take this   diclofenac 50 MG tablet Commonly known as: CATAFLAM Take 1 tablet (50 mg total) by mouth 2 (two) times daily as needed.   dicyclomine 20 MG tablet Commonly known as: BENTYL Take 1 tablet (20 mg total) by mouth 2 (two) times daily. What changed:   when to take  this  reasons to take this   erythromycin ophthalmic ointment Place a 1/2 inch ribbon of ointment into the lower eyelid.   esomeprazole 40 MG capsule Commonly known as: NEXIUM Take 40 mg by mouth every morning.   estradiol 1.53 MG/SPRAY transdermal spray Commonly known as: EVAMIST Place 3 sprays onto the skin daily.   ezetimibe 10 MG tablet Commonly known as: ZETIA Take 10 mg by mouth daily.   HYDROcodone-acetaminophen 5-325 MG tablet Commonly known as: NORCO/VICODIN Take 1-2 tablets by mouth every 6 (six) hours as needed for severe pain.   Levemir FlexTouch 100 UNIT/ML FlexPen Generic drug: insulin detemir Inject 15 Units into the skin in the morning and at bedtime.   levothyroxine 125 MCG tablet Commonly known as: SYNTHROID Take two tablets (  total) by mouth once daily in the morning.   lidocaine 5 % Commonly known as: Lidoderm Place 1 patch onto the skin daily. Remove & Discard patch within 12 hours or as directed by MD   lisinopril 5 MG tablet Commonly known as: ZESTRIL Take 1 tablet (5 mg total) by mouth daily.   metFORMIN 500 MG tablet Commonly known as: GLUCOPHAGE Take 500 mg by  mouth 2 (two) times daily with a meal.   methocarbamol 750 MG tablet Commonly known as: ROBAXIN Take 1 tablet (750 mg total) by mouth 2 (two) times daily as needed for muscle spasms (or muscle tightness).   metoprolol tartrate 50 MG tablet Commonly known as: LOPRESSOR Take 50 mg by mouth in the morning.   metoprolol tartrate 25 MG tablet Commonly known as: LOPRESSOR Take 25 mg by mouth every evening.   metoprolol tartrate 100 MG tablet Commonly known as: LOPRESSOR Take 1 tablet (100 mg total) by mouth every evening.   metroNIDAZOLE 1 % gel Commonly known as: METROGEL Apply 1 application topically at bedtime.   montelukast 10 MG tablet Commonly known as: SINGULAIR Take 10 mg by mouth every morning.   multivitamin with minerals tablet Take 1 tablet by mouth  daily.   nystatin cream Commonly known as: MYCOSTATIN Apply 1 application topically daily as needed for dry skin.   ondansetron 4 MG disintegrating tablet Commonly known as: Zofran ODT Take 1 tablet (4 mg total) by mouth every 8 (eight) hours as needed for nausea or vomiting.   ondansetron 8 MG tablet Commonly known as: ZOFRAN Take 8 mg by mouth as needed for nausea or vomiting.   pantoprazole 20 MG tablet Commonly known as: PROTONIX Take 20 mg by mouth daily.   PATADAY OP Apply 2 drops to eye daily.   RED YEAST RICE PO Take 1 tablet by mouth daily.   rizatriptan 10 MG tablet Commonly known as: Maxalt Take 1 tablet (10 mg total) by mouth 3 (three) times daily as needed for migraine. May repeat in 2 hours if needed   TRETINOIN EX Apply topically daily.   triamcinolone cream 0.1 % Commonly known as: KENALOG Apply 1 application topically 2 (two) times daily as needed. What changed: reasons to take this   triamterene-hydrochlorothiazide 37.5-25 MG tablet Commonly known as: MAXZIDE-25 Take 1 tablet by mouth daily.   VITAMIN B-2 PO Take 1 tablet by mouth daily.       Allergies:  Allergies  Allergen Reactions  . Percocet [Oxycodone-Acetaminophen] Nausea And Vomiting    Past Medical History:  Diagnosis Date  . Asthma   . Blood transfusion without reported diagnosis   . Chronic migraine   . Common migraine with intractable migraine 02/16/2019  . Endometriosis   . Fatty liver   . GERD (gastroesophageal reflux disease)   . Hyperlipidemia   . Hypertension   . Hypothyroidism   . Incontinence   . Irregular heartbeat   . Multiple sclerosis (HCC)   . Palpitations   . Pre-diabetes   . Sleep apnea     Past Surgical History:  Procedure Laterality Date  . ABDOMINAL HYSTERECTOMY    . CESAREAN SECTION     x1  . CHOLECYSTECTOMY    . ENDOMETRIAL ABLATION    . FOOT SURGERY Left 06/2018   foot reconstruction  . REPLACEMENT TOTAL KNEE Left     Family History   Problem Relation Age of Onset  . Heart attack Mother   . Stroke Mother   . Cancer Mother        breast cancer  . Hyperlipidemia Mother   . Hypothyroidism Mother   . Cancer Father        lymph nodes, liver cancer  . Other Daughter        Fine Gold Type II Syndrome and cognetive issues  . Memory loss Daughter   . Other Son  Fine Gold Syndrome and BPES  . Diabetes Son 22       IDDM  . Hypothyroidism Sister   . Cancer Sister   . Hyperthyroidism Neg Hx     Social History:  reports that she has never smoked. She has never used smokeless tobacco. She reports previous alcohol use. She reports that she does not use drugs.   Review of Systems HYPOTHYROIDISM: She initially had symptoms of fatigue, hair loss and difficulties with weight when she was a teenager She also has a strong family history of hypothyroidism She likely has been on thyroid supplements for about 25 years or more.  Fairly consistently  Although she has been on Synthroid mostly about 2 years ago an outside physician told her to take liothyronine also that she did not know why and she did not feel any better with starting this combination She previously was on levothyroxine 225 mcg and Cytomel 5 mcg for over 2 years  This was changed to levothyroxine 250 mcg daily on her last visit in 11/20  No new fatigue, has not had any recent labs   Lab Results  Component Value Date   TSH 0.81 07/24/2019   TSH 1.734 03/24/2019   TSH 9.694 (H) 02/24/2019   FREET4 1.04 07/24/2019   FREET4 0.73 02/24/2019   FREET4 0.62 (L) 11/25/2018    Lipid history: She was started on Lipitor 10 mg by her PCP but she thinks it caused some upper body muscle aches She was also told previously to start on red rice yeast, she continued this also    Lab Results  Component Value Date   CHOL 246 (H) 02/24/2019   HDL 45 02/24/2019   LDLCALC 163 (H) 02/24/2019   TRIG 188 (H) 02/24/2019   CHOLHDL 5.5 02/24/2019            Hypertension: Has been present since age 26 Treated with lisinopril, metoprolol and Maxzide and followed by local physician and cardiologist Blood pressure is higher today from anxiety  BP Readings from Last 3 Encounters:  02/19/20 (!) 140/100  02/13/20 122/76  11/02/19 (!) 145/94  .  Lab Results  Component Value Date   K 3.9 02/13/2020     Most recent foot exam: 11/20  Currently known complications of diabetes:?  Neuropathy  LABS:  Office Visit on 02/19/2020  Component Date Value Ref Range Status  . Hemoglobin A1C 02/19/2020 9.5* 4.0 - 5.6 % Final  Admission on 02/12/2020, Discharged on 02/13/2020  Component Date Value Ref Range Status  . WBC 02/12/2020 7.2  4.0 - 10.5 K/uL Final  . RBC 02/12/2020 4.60  3.87 - 5.11 MIL/uL Final  . Hemoglobin 02/12/2020 13.8  12.0 - 15.0 g/dL Final  . HCT 02/12/2020 42.3  36 - 46 % Final  . MCV 02/12/2020 92.0  80.0 - 100.0 fL Final  . MCH 02/12/2020 30.0  26.0 - 34.0 pg Final  . MCHC 02/12/2020 32.6  30.0 - 36.0 g/dL Final  . RDW 02/12/2020 12.0  11.5 - 15.5 % Final  . Platelets 02/12/2020 286  150 - 400 K/uL Final  . nRBC 02/12/2020 0.0  0.0 - 0.2 % Final   Performed at [facility], [place] [address] [place], [place] [address]  . Sodium 02/12/2020 138  135 - 145 mmol/L Final  . Potassium 02/12/2020 3.8  3.5 - 5.1 mmol/L Final  . Chloride 02/12/2020 107  98 - 111 mmol/L Final  . CO2 02/12/2020 20* 22 - 32 mmol/L  Final  . Glucose, Bld 02/12/2020 221* 70 - 99 mg/dL Final   Glucose reference range applies only to samples taken after fasting for at least 8 hours.  . BUN 02/12/2020 12  6 - 20 mg/dL Final  . Creatinine, Ser 02/12/2020 0.97  0.44 - 1.00 mg/dL Final  . Calcium 77/93/9030 9.3  8.9 - 10.3 mg/dL Final  . GFR calc non Af Amer 02/12/2020 >60  >60 mL/min Final  . GFR calc Af Amer 02/12/2020 >60  >60 mL/min Final  . Anion gap 02/12/2020 11  5 - 15 Final   Performed at [facility], [address] [place], [place]  [address]  . Glucose-Capillary 02/13/2020 149* 70 - 99 mg/dL Final   Glucose reference range applies only to samples taken after fasting for at least 8 hours.  . Sodium 02/13/2020 140  135 - 145 mmol/L Final  . Potassium 02/13/2020 3.9  3.5 - 5.1 mmol/L Final  . Chloride 02/13/2020 107  98 - 111 mmol/L Final  . CO2 02/13/2020 21* 22 - 32 mmol/L Final  . Glucose, Bld 02/13/2020 158* 70 - 99 mg/dL Final   Glucose reference range applies only to samples taken after fasting for at least 8 hours.  . BUN 02/13/2020 13  6 - 20 mg/dL Final  . Creatinine, Ser 02/13/2020 0.76  0.44 - 1.00 mg/dL Final  . Calcium 00/76/2263 9.2  8.9 - 10.3 mg/dL Final  . Total Protein 02/13/2020 8.0  6.5 - 8.1 g/dL Final  . Albumin 33/54/5625 3.8  3.5 - 5.0 g/dL Final  . AST 63/89/3734 70* 15 - 41 U/L Final  . ALT 02/13/2020 97* 0 - 44 U/L Final  . Alkaline Phosphatase 02/13/2020 111  38 - 126 U/L Final  . Total Bilirubin 02/13/2020 0.2* 0.3 - 1.2 mg/dL Final  . GFR calc non Af Amer 02/13/2020 >60  >60 mL/min Final  . GFR calc Af Amer 02/13/2020 >60  >60 mL/min Final  . Anion gap 02/13/2020 12  5 - 15 Final   Performed at [facility], [address], [place], [place] [address]  . WBC 02/13/2020 8.0  4.0 - 10.5 K/uL Final  . RBC 02/13/2020 4.52  3.87 - 5.11 MIL/uL Final  . Hemoglobin 02/13/2020 13.6  12.0 - 15.0 g/dL Final  . HCT 11/57/2620 41.5  36 - 46 % Final  . MCV 02/13/2020 91.8  80.0 - 100.0 fL Final  . MCH 02/13/2020 30.1  26.0 - 34.0 pg Final  . MCHC 02/13/2020 32.8  30.0 - 36.0 g/dL Final  . RDW 35/59/7416 12.1  11.5 - 15.5 % Final  . Platelets 02/13/2020 304  150 - 400 K/uL Final  . nRBC 02/13/2020 0.0  0.0 - 0.2 % Final  . Neutrophils Relative % 02/13/2020 55  % Final  . Neutro Abs 02/13/2020 4.4  1.7 - 7.7 K/uL Final  . Lymphocytes Relative 02/13/2020 33  % Final  . Lymphs Abs 02/13/2020 2.6  0.7 - 4.0 K/uL Final  . Monocytes Relative 02/13/2020 8  % Final  . Monocytes Absolute 02/13/2020 0.6   0 - 1 K/uL Final  . Eosinophils Relative 02/13/2020 3  % Final  . Eosinophils Absolute 02/13/2020 0.2  0 - 0 K/uL Final  . Basophils Relative 02/13/2020 1  % Final  . Basophils Absolute 02/13/2020 0.0  0 - 0 K/uL Final  . Immature Granulocytes 02/13/2020 0  % Final  . Abs Immature Granulocytes 02/13/2020 0.03  0.00 -  0.07 K/uL Final   Performed at [facility], [address], [place], [place] [address]    Physical Examination:  BP (!) 140/100 (BP Location: Left Arm, Patient Position: Sitting, Cuff Size: Normal)   Pulse 68   Ht  (1.626 m)   Wt 209 lb 3.2 oz (94.9 kg)   SpO2 97%   BMI 35.91 kg/m   No ankle edema, she has areas of ecchymosis on her right lower leg near the ankle    ASSESSMENT:  Diabetes type 2 with obesity  See history of present illness for detailed discussion of current diabetes management, blood sugar patterns and problems identified  A1c is 9.5 but apparently recently 11.4  Current treatment regimen is basal insulin and low-dose Metformin without much improvement in her blood sugars She appears to have developed significant insulin deficiency with symptomatic hyperglycemia recently Blood sugars are not to be higher postprandially  She is a good candidate for a GLP-1 drug which would be more effective than SGLT2 drugs and would help her with weight loss, postprandial hyperglycemia and may be effective in reducing her insulin requirement   HYPOTHYROIDISM: She had been switched to 250 mcg levothyroxine and needs follow-up labs    PLAN:    Explained to the patient what GLP-1 drugs are, the sites of actions and the body, reduction of hunger sensation and improved insulin secretion.  Discussed the benefit of weight loss with these medications. Explained possible side effects of OZEMPIC, most commonly nausea that usually improves over time.  Also explained safety information associated with the medication Demonstrated the medication injection device  and injection technique to the patient.  To start the injections with the 0.25 mg dosage weekly for the first 4 injections and then go up to 0.5 mg weekly if no significant nausea  Patient brochure on Ozempic with sample pen given  She was also given patient samples of LEVEMIR which she can continue now However she does need to titrate the dose to get her morning sugars down below 130  Explained that she can use a flowsheet that was discussed in detail to help her adjust her Levemir dose by 2 units twice a day every 3 days until morning sugars are at target   She was also given a formatted log sheet for her blood sugars and discussed blood sugar targets both after meals and in the morning  She will also be given patient assistance forms for her basal insulin and Ozempic  Follow-up with diabetes educator in about 2 weeks and back here in 4 weeks    Patient Instructions  Check blood sugars on waking up 7 days a week  Also check blood sugars about 2 hours after meals and do this after different meals by rotation  Recommended blood sugar levels on waking up are 90-130 and about 2 hours after meal is 130-160  Please bring your blood sugar monitor to each visit, thank you  Start OZEMPIC injections by dialing 0.25 mg on the pen as shown once weekly on the same day of the week.   You may inject in the sides of the stomach, outer thigh or arm as indicated in the brochure given. If you have any difficulties using the pen see the video at [email or web address]  You will feel fullness of the stomach with starting the medication and should try to keep the portions at meals small.  You may experience nausea in the first few days which usually gets better over time  After 4 weeks increase the dose to 0.5 mg weekly  If you have any questions or persistent side effects please call the office   You may also talk to a nurse educator with [employer] at [phone] Useful website:  [email or web address]            Reather Littler 02/19/2020, 4:58 PM   Note: This office note was prepared with Dragon voice recognition system technology. Any transcriptional errors that result from this process are unintentional.

## 2020-02-20 ENCOUNTER — Encounter: Payer: Self-pay | Admitting: Gastroenterology

## 2020-02-20 ENCOUNTER — Ambulatory Visit: Payer: Self-pay | Admitting: Cardiology

## 2020-02-20 LAB — TSH: TSH: 0.11 u[IU]/mL — ABNORMAL LOW (ref 0.35–4.50)

## 2020-02-21 ENCOUNTER — Other Ambulatory Visit: Payer: Self-pay

## 2020-02-21 DIAGNOSIS — E063 Autoimmune thyroiditis: Secondary | ICD-10-CM

## 2020-02-21 MED ORDER — LEVOTHYROXINE SODIUM 125 MCG PO TABS: ORAL_TABLET | ORAL | 1 refills | Status: DC

## 2020-02-22 ENCOUNTER — Ambulatory Visit
Admission: EM | Admit: 2020-02-22 | Discharge: 2020-02-22 | Disposition: A | Discharge status: Home / Self Care | Payer: Self-pay

## 2020-02-22 ENCOUNTER — Encounter (HOSPITAL_COMMUNITY): Payer: Self-pay | Admitting: Emergency Medicine

## 2020-02-22 ENCOUNTER — Emergency Department (HOSPITAL_COMMUNITY): Payer: No Typology Code available for payment source

## 2020-02-22 ENCOUNTER — Emergency Department (HOSPITAL_COMMUNITY)
Admission: EM | Admit: 2020-02-22 | Discharge: 2020-02-22 | Disposition: A | Discharge status: Home / Self Care | Payer: No Typology Code available for payment source | Attending: Emergency Medicine | Admitting: Emergency Medicine

## 2020-02-22 ENCOUNTER — Other Ambulatory Visit: Payer: Self-pay

## 2020-02-22 DIAGNOSIS — R2241 Localized swelling, mass and lump, right lower limb: Secondary | ICD-10-CM | POA: Insufficient documentation

## 2020-02-22 DIAGNOSIS — J45909 Unspecified asthma, uncomplicated: Secondary | ICD-10-CM | POA: Diagnosis not present

## 2020-02-22 DIAGNOSIS — E119 Type 2 diabetes mellitus without complications: Secondary | ICD-10-CM | POA: Insufficient documentation

## 2020-02-22 DIAGNOSIS — E039 Hypothyroidism, unspecified: Secondary | ICD-10-CM | POA: Insufficient documentation

## 2020-02-22 DIAGNOSIS — Z79899 Other long term (current) drug therapy: Secondary | ICD-10-CM | POA: Diagnosis not present

## 2020-02-22 DIAGNOSIS — S8011XA Contusion of right lower leg, initial encounter: Secondary | ICD-10-CM

## 2020-02-22 DIAGNOSIS — Z7984 Long term (current) use of oral hypoglycemic drugs: Secondary | ICD-10-CM | POA: Diagnosis not present

## 2020-02-22 DIAGNOSIS — L03115 Cellulitis of right lower limb: Secondary | ICD-10-CM

## 2020-02-22 DIAGNOSIS — Z96652 Presence of left artificial knee joint: Secondary | ICD-10-CM | POA: Diagnosis not present

## 2020-02-22 DIAGNOSIS — I10 Essential (primary) hypertension: Secondary | ICD-10-CM | POA: Insufficient documentation

## 2020-02-22 DIAGNOSIS — M25571 Pain in right ankle and joints of right foot: Secondary | ICD-10-CM | POA: Diagnosis present
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
## 2020-02-22 MED ORDER — CEPHALEXIN 500 MG PO CAPS: 500.0000 mg | ORAL_CAPSULE | Freq: Four times a day (QID) | ORAL | 0 refills | Status: AC

## 2020-02-22 NOTE — Discharge Instructions (Addendum)
Your work-up today was overall reassuring.  Your ultrasound was negative for DVT, though they did note a likely hematoma which is a collection of blood under the skin.  This is not life-threatening and should likely clear on its own.  Please take antibiotics as prescribed until finished.  It is important that you finish the course of antibiotics in order to prevent further infection.  Please take over-the-counter Tylenol/ibuprofen for pain.  Please keep a close eye for worsening signs of infection.  If your symptoms do not improve, please follow-up with your primary care doctor.  If your symptoms worsen, please return to the ER.

## 2020-02-22 NOTE — ED Triage Notes (Signed)
Pt presents with right calf pain. Right calf is swollen with redness and heat. Provider made aware. Pt will need to go to ed to rule out DVT . Pt in agreement , traveling by private vehicle
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
## 2020-02-26 ENCOUNTER — Telehealth: Payer: Self-pay | Admitting: Neurology

## 2020-02-26 NOTE — Telephone Encounter (Signed)
This patient has had a recent MRI of the brain done on 21 February 2020 through [facility] health, ordered by her primary care physician.  The reason for the MRI was for headache.  The patient has a questionable history of multiple sclerosis, she has not undergone lumbar puncture to confirm or exclude diagnosis.   MRI brain 02/21/20:   Impression:  Cerebral atrophy and mild nonspecific white matter disease.  No acute infarct, hemorrhage, edema, or mass.  The patient is being treated for migraine, she currently is on Aimovig.

## 2020-02-27 ENCOUNTER — Ambulatory Visit: Payer: Self-pay | Admitting: Neurology

## 2020-02-28 ENCOUNTER — Telehealth: Payer: Self-pay

## 2020-02-28 NOTE — Telephone Encounter (Signed)
FAXED DOCUMENTS  Company: [employer] patient assistance application (Faxed on 02/23/20) Document: PAP for Ozempic Other records requested: none at this time.  All above requested information has been faxed successfully to [employer] listed above. Documents and fax confirmation have been placed in the faxed file for future reference.   The patient has the patient portion of the PAP application. She is aware that she is to fill this out completely and include a copy social security aware letter or tax filings and mail or fax directly to [employer].

## 2020-03-12 ENCOUNTER — Ambulatory Visit: Payer: Medicaid Other | Admitting: Endocrinology

## 2020-03-12 ENCOUNTER — Ambulatory Visit: Payer: Self-pay | Admitting: Cardiology

## 2020-03-12 NOTE — Progress Notes (Deleted)
Clinical Summary Amy Lester is a 49 y.o.female  seen today for follow up of the following medical problems.   1. Palpitations - seen in ER with episode of palpitations. Occurred while laying in bed. Recent increase in symptoms. Heavy hard beats. Symptoms lasted about 10 minutes. Felt weak all over. In general episodes occur few times a week.  - rare cafffeine, no EtOH. - followed previously in [place]. Reports wearing previous holter monitors - EKG SRin ER. TSH 1.7. hstrop neg, K 3.9  - last monitor 2-3 years ago she believes. She thinks overall benign.   03/2019 event monitor no arrhythmias  - still some palpitations at times.  - changed to toprol last visit but made her have fatigue, she went back to lopressor, only taking 25mg  in AM and 50mg  in PM   2. Hyperlipidemia - she is compliant with statin 02/2019 TC 246 HDL 45 TG 188 LDL 163   3. LE edema - primarily left sided, she has had prior left ankle surgery  05/2019 echo LVEF >65%, indet diastolic function - some swelling at times.   4. HTN - compliant with meds Past Medical History:  Diagnosis Date  . Asthma   . Blood transfusion without reported diagnosis   . Chronic migraine   . Common migraine with intractable migraine 02/16/2019  . Endometriosis   . Fatty liver   . GERD (gastroesophageal reflux disease)   . Hyperlipidemia   . Hypertension   . Hypothyroidism   . Incontinence   . Irregular heartbeat   . Multiple sclerosis (HCC)   . Palpitations   . Pre-diabetes   . Sleep apnea      Allergies  Allergen Reactions  . Percocet [Oxycodone-Acetaminophen] Nausea And Vomiting     Current Outpatient Medications  Medication Sig Dispense Refill  . albuterol (VENTOLIN HFA) 108 (90 Base) MCG/ACT inhaler Inhale 3 puffs into the lungs every 6 (six) hours as needed for wheezing or shortness of breath. 3 g 1  . atorvastatin (LIPITOR) 20 MG tablet Take 1 tablet (20 mg total) by mouth daily. 90 tablet  1  . cetirizine (ZYRTEC) 10 MG tablet Take 1 tablet (10 mg total) by mouth daily. 90 tablet 0  . clotrimazole-betamethasone (LOTRISONE) cream Apply 1 application topically as needed. (Patient taking differently: Apply 1 application topically as needed (Outbreak). ) 45 g 2  . diclofenac (CATAFLAM) 50 MG tablet Take 1 tablet (50 mg total) by mouth 2 (two) times daily as needed. 20 tablet 1  . dicyclomine (BENTYL) 20 MG tablet Take 1 tablet (20 mg total) by mouth 2 (two) times daily. (Patient taking differently: Take 20 mg by mouth 2 (two) times daily as needed. ) 20 tablet 0  . Erenumab-aooe (AIMOVIG, 140 MG DOSE, [place]) Inject into the skin every 30 (thirty) days.     06/2019 erythromycin ophthalmic ointment Place a 1/2 inch ribbon of ointment into the lower eyelid. 3.5 g 0  . esomeprazole (NEXIUM) 40 MG capsule Take 40 mg by mouth every morning.    04/18/2019 estradiol (EVAMIST) 1.53 MG/SPRAY transdermal spray Place 3 sprays onto the skin daily.    Marland Kitchen ezetimibe (ZETIA) 10 MG tablet Take 10 mg by mouth daily.     Marland Kitchen HYDROcodone-acetaminophen (NORCO/VICODIN) 5-325 MG tablet Take 1-2 tablets by mouth every 6 (six) hours as needed for severe pain. (Patient not taking: Reported on 02/19/2020) 10 tablet 0  . LEVEMIR FLEXTOUCH 100 UNIT/ML FlexPen Inject 15 Units into the skin in the  morning and at bedtime.    Marland Kitchen levothyroxine (SYNTHROID) 125 MCG tablet Take two tablets (250mg  total) by mouth days 1-6 and skip day 7. 180 tablet 1  . lidocaine (LIDODERM) 5 % Place 1 patch onto the skin daily. Remove & Discard patch within 12 hours or as directed by MD (Patient not taking: Reported on 02/19/2020) 30 patch 0  . lisinopril (ZESTRIL) 5 MG tablet Take 1 tablet (5 mg total) by mouth daily. 90 tablet 1  . metFORMIN (GLUCOPHAGE) 500 MG tablet Take 500 mg by mouth 2 (two) times daily with a meal.     . methocarbamol (ROBAXIN) 750 MG tablet Take 1 tablet (750 mg total) by mouth 2 (two) times daily as needed for muscle spasms (or muscle  tightness). 20 tablet 0  . metoprolol tartrate (LOPRESSOR) 100 MG tablet Take 1 tablet (100 mg total) by mouth every evening. (Patient not taking: Reported on 02/13/2020) 90 tablet 1  . metoprolol tartrate (LOPRESSOR) 25 MG tablet Take 25 mg by mouth every evening.    . metoprolol tartrate (LOPRESSOR) 50 MG tablet Take 50 mg by mouth in the morning.    . metroNIDAZOLE (METROGEL) 1 % gel Apply 1 application topically at bedtime.     . montelukast (SINGULAIR) 10 MG tablet Take 10 mg by mouth every morning.    . Multiple Vitamins-Minerals (MULTIVITAMIN WITH MINERALS) tablet Take 1 tablet by mouth daily.    04/14/2020 nystatin cream (MYCOSTATIN) Apply 1 application topically daily as needed for dry skin.    Marland Kitchen Olopatadine HCl (PATADAY OP) Apply 2 drops to eye daily.    . ondansetron (ZOFRAN ODT) 4 MG disintegrating tablet Take 1 tablet (4 mg total) by mouth every 8 (eight) hours as needed for nausea or vomiting. 20 tablet 0  . ondansetron (ZOFRAN) 8 MG tablet Take 8 mg by mouth as needed for nausea or vomiting.    . pantoprazole (PROTONIX) 20 MG tablet Take 20 mg by mouth daily.    . Red Yeast Rice Extract (RED YEAST RICE PO) Take 1 tablet by mouth daily.     . Riboflavin (VITAMIN B-2 PO) Take 1 tablet by mouth daily.     . rizatriptan (MAXALT) 10 MG tablet Take 1 tablet (10 mg total) by mouth 3 (three) times daily as needed for migraine. May repeat in 2 hours if needed 27 tablet 1  . TRETINOIN EX Apply topically daily.    Marland Kitchen triamcinolone cream (KENALOG) 0.1 % Apply 1 application topically 2 (two) times daily as needed. (Patient taking differently: Apply 1 application topically 2 (two) times daily as needed (rash). ) 45 g 0  . triamterene-hydrochlorothiazide (MAXZIDE-25) 37.5-25 MG tablet Take 1 tablet by mouth daily. 90 tablet 1   No current facility-administered medications for this visit.     Past Surgical History:  Procedure Laterality Date  . ABDOMINAL HYSTERECTOMY    . CESAREAN SECTION     x1  .  CHOLECYSTECTOMY    . ENDOMETRIAL ABLATION    . FOOT SURGERY Left 06/2018   foot reconstruction  . REPLACEMENT TOTAL KNEE Left      Allergies  Allergen Reactions  . Percocet [Oxycodone-Acetaminophen] Nausea And Vomiting      Family History  Problem Relation Age of Onset  . Heart attack Mother   . Stroke Mother   . Cancer Mother        breast cancer  . Hyperlipidemia Mother   . Hypothyroidism Mother   . Cancer Father  lymph nodes, liver cancer  . Other Daughter        Fine Gold Type II Syndrome and cognetive issues  . Memory loss Daughter   . Other Son        Fine Gold Syndrome and BPES  . Diabetes Son 22       IDDM  . Hypothyroidism Sister   . Cancer Sister   . Hyperthyroidism Neg Hx      Social History Ms. Shoults reports that she has never smoked. She has never used smokeless tobacco. Ms. Renaldo reports previous alcohol use.   Review of Systems CONSTITUTIONAL: No weight loss, fever, chills, weakness or fatigue.  HEENT: Eyes: No visual loss, blurred vision, double vision or yellow sclerae.No hearing loss, sneezing, congestion, runny nose or sore throat.  SKIN: No rash or itching.  CARDIOVASCULAR:  RESPIRATORY: No shortness of breath, cough or sputum.  GASTROINTESTINAL: No anorexia, nausea, vomiting or diarrhea. No abdominal pain or blood.  GENITOURINARY: No burning on urination, no polyuria NEUROLOGICAL: No headache, dizziness, syncope, paralysis, ataxia, numbness or tingling in the extremities. No change in bowel or bladder control.  MUSCULOSKELETAL: No muscle, back pain, joint pain or stiffness.  LYMPHATICS: No enlarged nodes. No history of splenectomy.  PSYCHIATRIC: No history of depression or anxiety.  ENDOCRINOLOGIC: No reports of sweating, cold or heat intolerance. No polyuria or polydipsia.  Marland Kitchen   Physical Examination There were no vitals filed for this visit. There were no vitals filed for this visit.  Gen: resting comfortably, no acute  distress HEENT: no scleral icterus, pupils equal round and reactive, no palptable cervical adenopathy,  CV Resp: Clear to auscultation bilaterally GI: abdomen is soft, non-tender, non-distended, normal bowel sounds, no hepatosplenomegaly MSK: extremities are warm, no edema.  Skin: warm, no rash Neuro:  no focal deficits Psych: appropriate affect   Diagnostic Studies 05/2019 echo IMPRESSIONS   1. The left ventricle has hyperdynamic systolic function, with an ejection fraction of >65%. The cavity size was normal. Left ventricular diastolic parameters were normal. No evidence of left ventricular regional wall motion abnormalities. 2. The right ventricle has normal systolic function. The cavity was normal. There is no increase in right ventricular wall thickness. 3. The aortic valve is tricuspid. Mild aortic annular calcification noted. 4. The mitral valve is grossly normal. 5. The tricuspid valve is grossly normal. 6. The aorta is normal unless otherwise noted.   03/2019 event monitor  14 day event monitor  Min HR 55, Max HR 161, Avg HR 80  Reported symptoms correlate with sinus rhythm  No significant arrhythmias    Assessment and Plan   1. Palpitations - benign event monitor.  - remains on lopressor for symptoms, increase to 50mg  in AM and 100mg  in pm   2. HTN - at goal. Some orthostatic symptoms at times, she endorsed adequate hydration - since increase lopressor, will lower her lisinopril to 5mg  daily.   3. Hyperlipidemia - not at goal, increase atorva to 20mg  daily.   4. LE edema - benign echo, likely venous insufficiency - given Rx for compression stockings, continue diuretic  F/u 6 months      , M.D., F.A.C.C.

## 2020-03-18 ENCOUNTER — Other Ambulatory Visit: Payer: Self-pay

## 2020-03-18 ENCOUNTER — Encounter: Payer: Self-pay | Admitting: Endocrinology

## 2020-03-18 ENCOUNTER — Ambulatory Visit (INDEPENDENT_AMBULATORY_CARE_PROVIDER_SITE_OTHER): Payer: Self-pay | Admitting: Endocrinology

## 2020-03-18 VITALS — BP 132/82 | HR 96 | Ht 64.0 in | Wt 208.2 lb

## 2020-03-18 DIAGNOSIS — E063 Autoimmune thyroiditis: Secondary | ICD-10-CM

## 2020-03-18 DIAGNOSIS — Z794 Long term (current) use of insulin: Secondary | ICD-10-CM

## 2020-03-18 DIAGNOSIS — E1165 Type 2 diabetes mellitus with hyperglycemia: Secondary | ICD-10-CM

## 2020-03-18 NOTE — Progress Notes (Signed)
Patient ID: Amy Lester, female   DOB: 11/09/1970, 49 y.o.   MRN: [id number]           Reason for Appointment: Endocrinology follow-up  Referring PCP: Abbe Amsterdam   History of Present Illness:          Date of diagnosis of type 2 diabetes mellitus:   2018      Background history:   She apparently had been told to have prediabetes prior to diagnosis of diabetes.  However she did not have gestational diabetes with her 3 pregnancies She remembers her A1c being 9% at the time of diagnosis but records are not available She was tried on Metformin but this caused abdominal discomfort and she did not take this and was not given any other treatment She thinks her blood sugars improved with better diet  Recent history:    Most recent A1c is 9.5      Non-insulin hypoglycemic drugs the patient is taking are: Metformin 500 mg twice daily, Ozempic 0.25 mg weekly  INSULIN regimen: Levemir 30 units twice daily   Current management, blood sugar patterns and problems identified:  She was seen in follow-up again in June 2021 with persistent hyperglycemia with taking Metformin and relatively low dose Levemir  She was told to progressively increase her Levemir dose based on fasting blood sugars and now she is taking twice as much as before  Also started on Ozempic 0.25 mg weekly and she has had 2 injections  She did have nausea with the first injection but is doing fine now  She also has some more satiety with this  Has lost 2 pounds  She feels overall better  Did not bring her meter or any blood sugar records today for review  Also not clear how high her blood sugars are after breakfast and lunch but usually not appearing to have high postprandial readings after dinner  She is not doing much walking for exercise        Side effects from medications have been: Abdominal distress from Metformin               Glucose monitoring:  done 0-1 times a day         Glucometer:   Generic    Blood Glucose readings by recall recently as below:   PRE-MEAL Fasting Lunch Dinner Bedtime Overall  Glucose range: 89-128    89-220  Mean/median:        POST-MEAL PC Breakfast PC Lunch PC Dinner  Glucose range:   180s  Mean/median:        Dietician visit, most recent: Never  Weight history:  Wt Readings from Last 3 Encounters:  03/18/20 208 lb 3.2 oz (94.4 kg)  02/22/20 210 lb (95.3 kg)  02/19/20 209 lb 3.2 oz (94.9 kg)    Glycemic control:   Lab Results  Component Value Date   HGBA1C 9.5 (A) 02/19/2020   HGBA1C 7.8 (H) 07/24/2019   HGBA1C 6.4 (A) 04/12/2019   Lab Results  Component Value Date   MICROALBUR 0.9 07/24/2019   LDLCALC 163 (H) 02/24/2019   CREATININE 0.76 02/13/2020   Lab Results  Component Value Date   MICRALBCREAT 0.8 07/24/2019    No results found for: FRUCTOSAMINE  HYPOTHYROIDISM: See review of systems    No visits with results within 1 Week(s) from this visit.  Latest known visit with results is:  Office Visit on 02/19/2020  Component Date Value Ref Range Status  . Hemoglobin A1C  02/19/2020 9.5* 4.0 - 5.6 % Final  . TSH 02/19/2020 0.11* 0.35 - 4.50 uIU/mL Final    Allergies as of 03/18/2020      Reactions   Percocet [oxycodone-acetaminophen] Nausea And Vomiting      Medication List       Accurate as of March 18, 2020  9:25 PM. If you have any questions, ask your nurse or doctor.        AIMOVIG (140 MG DOSE) [place] Inject into the skin every 30 (thirty) days.   albuterol 108 (90 Base) MCG/ACT inhaler Commonly known as: VENTOLIN HFA Inhale 3 puffs into the lungs every 6 (six) hours as needed for wheezing or shortness of breath.   atorvastatin 20 MG tablet Commonly known as: LIPITOR Take 1 tablet (20 mg total) by mouth daily.   cetirizine 10 MG tablet Commonly known as: ZYRTEC Take 1 tablet (10 mg total) by mouth daily.   clotrimazole-betamethasone cream Commonly known as: Lotrisone Apply 1 application  topically as needed. What changed: reasons to take this   diclofenac 50 MG tablet Commonly known as: CATAFLAM Take 1 tablet (50 mg total) by mouth 2 (two) times daily as needed.   dicyclomine 20 MG tablet Commonly known as: BENTYL Take 1 tablet (20 mg total) by mouth 2 (two) times daily. What changed:   when to take this  reasons to take this   erythromycin ophthalmic ointment Place a 1/2 inch ribbon of ointment into the lower eyelid.   esomeprazole 40 MG capsule Commonly known as: NEXIUM Take 40 mg by mouth every morning.   estradiol 1.53 MG/SPRAY transdermal spray Commonly known as: EVAMIST Place 3 sprays onto the skin daily.   ezetimibe 10 MG tablet Commonly known as: ZETIA Take 10 mg by mouth daily.   HYDROcodone-acetaminophen 5-325 MG tablet Commonly known as: NORCO/VICODIN Take 1-2 tablets by mouth every 6 (six) hours as needed for severe pain.   Levemir FlexTouch 100 UNIT/ML FlexPen Generic drug: insulin detemir Inject 15 Units into the skin in the morning and at bedtime.   levothyroxine 125 MCG tablet Commonly known as: SYNTHROID Take two tablets (  total) by mouth days 1-6 and skip day 7.   lidocaine 5 % Commonly known as: Lidoderm Place 1 patch onto the skin daily. Remove & Discard patch within 12 hours or as directed by MD   lisinopril 5 MG tablet Commonly known as: ZESTRIL Take 1 tablet (5 mg total) by mouth daily.   metFORMIN 500 MG tablet Commonly known as: GLUCOPHAGE Take 500 mg by mouth 2 (two) times daily with a meal.   methocarbamol 750 MG tablet Commonly known as: ROBAXIN Take 1 tablet (750 mg total) by mouth 2 (two) times daily as needed for muscle spasms (or muscle tightness).   metoprolol tartrate 50 MG tablet Commonly known as: LOPRESSOR Take 50 mg by mouth in the morning.   metoprolol tartrate 25 MG tablet Commonly known as: LOPRESSOR Take 25 mg by mouth every evening.   metoprolol tartrate 100 MG tablet Commonly known  as: LOPRESSOR Take 1 tablet (100 mg total) by mouth every evening.   metroNIDAZOLE 1 % gel Commonly known as: METROGEL Apply 1 application topically at bedtime.   montelukast 10 MG tablet Commonly known as: SINGULAIR Take 10 mg by mouth every morning.   multivitamin with minerals tablet Take 1 tablet by mouth daily.   nystatin cream Commonly known as: MYCOSTATIN Apply 1 application topically daily as needed for dry skin.   ondansetron 4  MG disintegrating tablet Commonly known as: Zofran ODT Take 1 tablet (4 mg total) by mouth every 8 (eight) hours as needed for nausea or vomiting.   ondansetron 8 MG tablet Commonly known as: ZOFRAN Take 8 mg by mouth as needed for nausea or vomiting.   pantoprazole 20 MG tablet Commonly known as: PROTONIX Take 20 mg by mouth daily.   PATADAY OP Apply 2 drops to eye daily.   RED YEAST RICE PO Take 1 tablet by mouth daily.   rizatriptan 10 MG tablet Commonly known as: Maxalt Take 1 tablet (10 mg total) by mouth 3 (three) times daily as needed for migraine. May repeat in 2 hours if needed   TRETINOIN EX Apply topically daily.   triamcinolone cream 0.1 % Commonly known as: KENALOG Apply 1 application topically 2 (two) times daily as needed. What changed: reasons to take this   triamterene-hydrochlorothiazide 37.5-25 MG tablet Commonly known as: MAXZIDE-25 Take 1 tablet by mouth daily.   VITAMIN B-2 PO Take 1 tablet by mouth daily.       Allergies:  Allergies  Allergen Reactions  . Percocet [Oxycodone-Acetaminophen] Nausea And Vomiting    Past Medical History:  Diagnosis Date  . Asthma   . Blood transfusion without reported diagnosis   . Chronic migraine   . Common migraine with intractable migraine 02/16/2019  . Endometriosis   . Fatty liver   . GERD (gastroesophageal reflux disease)   . Hyperlipidemia   . Hypertension   . Hypothyroidism   . Incontinence   . Irregular heartbeat   . Multiple sclerosis (HCC)   .  Palpitations   . Pre-diabetes   . Sleep apnea     Past Surgical History:  Procedure Laterality Date  . ABDOMINAL HYSTERECTOMY    . CESAREAN SECTION     x1  . CHOLECYSTECTOMY    . ENDOMETRIAL ABLATION    . FOOT SURGERY Left 06/2018   foot reconstruction  . REPLACEMENT TOTAL KNEE Left     Family History  Problem Relation Age of Onset  . Heart attack Mother   . Stroke Mother   . Cancer Mother        breast cancer  . Hyperlipidemia Mother   . Hypothyroidism Mother   . Cancer Father        lymph nodes, liver cancer  . Other Daughter        Fine Gold Type II Syndrome and cognetive issues  . Memory loss Daughter   . Other Son        Fine Gold Syndrome and BPES  . Diabetes Son 22       IDDM  . Hypothyroidism Sister   . Cancer Sister   . Hyperthyroidism Neg Hx     Social History:  reports that she has never smoked. She has never used smokeless tobacco. She reports previous alcohol use. She reports that she does not use drugs.   Review of Systems  HYPOTHYROIDISM: She initially had symptoms of fatigue, hair loss and difficulties with weight when she was a teenager She also has a strong family history of hypothyroidism She likely has been on thyroid supplements for about 25 years or more.  Fairly consistently  Although she has been on Synthroid mostly about 2 years ago an outside physician told her to take liothyronine also that she did not know why and she did not feel any better with starting this combination She previously was on levothyroxine 225 mcg and Cytomel 5 mcg for  over 2 years  This was changed to levothyroxine 250 mcg daily on her visit in 11/20 Now taking this 6/7 days a week since on her last visit TSH was low However she thinks she is a little more tired recently and may take a nap in the afternoon Not clear if this is related to stress   Lab Results  Component Value Date   TSH 0.11 (L) 02/19/2020   TSH 0.81 07/24/2019   TSH 1.734 03/24/2019   FREET4  1.04 07/24/2019   FREET4 0.73 02/24/2019   FREET4 0.62 (L) 11/25/2018    Lipid history: She was started on Lipitor 10 mg by her PCP but she thinks it caused some upper body muscle aches She was also told previously to start on red rice yeast which she is taking    Lab Results  Component Value Date   CHOL 246 (H) 02/24/2019   HDL 45 02/24/2019   LDLCALC 163 (H) 02/24/2019   TRIG 188 (H) 02/24/2019   CHOLHDL 5.5 02/24/2019           Hypertension: Has been present since age 66 Treated with lisinopril, metoprolol and Maxzide and followed by local physician and cardiologist Blood pressure readings recently:  BP Readings from Last 3 Encounters:  03/18/20 132/82  02/22/20 137/89  02/22/20 139/82  .  Lab Results  Component Value Date   K 3.9 02/13/2020     Most recent foot exam: 11/20  Currently known complications of diabetes:?  Neuropathy  LABS:  No visits with results within 1 Week(s) from this visit.  Latest known visit with results is:  Office Visit on 02/19/2020  Component Date Value Ref Range Status  . Hemoglobin A1C 02/19/2020 9.5* 4.0 - 5.6 % Final  . TSH 02/19/2020 0.11* 0.35 - 4.50 uIU/mL Final    Physical Examination:  BP 132/82 (BP Location: Right Arm, Patient Position: Sitting, Cuff Size: Large)   Pulse 96   Ht 5\' 4"  (1.626 m)   Wt 208 lb 3.2 oz (94.4 kg)   SpO2 98%   BMI 35.74 kg/m       ASSESSMENT:  Diabetes type 2 with obesity  See history of present illness for detailed discussion of current diabetes management, blood sugar patterns and problems identified  A1c is 9.5  Current treatment regimen is twice a day Levemir insulin and now Ozempic along with her low-dose Metformin Blood sugars are reportedly much better at home although this cannot be confirmed Her weight has leveled off despite increasing insulin to twice the previous amount and better blood sugars   HYPOTHYROIDISM: She had been switched to 250 mcg levothyroxine 6 days a  week and needs follow-up labs    PLAN:    Continue Ozempic 0.25 mg for another 2 shots and then 0.5 mg weekly Bring blood sugars on a logbook type sheet that was given to her since she cannot afford brand-name test strips For now no change Levemir Once her morning sugars are getting below 100 she can start reducing Levemir by 2 units twice daily   Paperwork done for patient assistance program for She is going to switch to [place] when available and reduce the total dose compared to Levemir by 10 units when she changes This can be done once a day  Patient Instructions  Check blood sugars on waking up 5-6 days a week  Also check blood sugars about 2 hours after meals and do this after different meals by rotation  Recommended  blood sugar levels on waking up are 90-130 and about 2 hours after meal is 130-160  Please bring your blood sugar monitor to each visit, thank you         Reather Littler 03/18/2020, 9:25 PM   Note: This office note was prepared with Dragon voice recognition system technology. Any transcriptional errors that result from this process are unintentional.

## 2020-03-18 NOTE — Patient Instructions (Signed)
Check blood sugars on waking up 5-6 days a week ° °Also check blood sugars about 2 hours after meals and do this after different meals by rotation ° °Recommended blood sugar levels on waking up are 90-130 and about 2 hours after meal is 130-160 ° °Please bring your blood sugar monitor to each visit, thank you ° °

## 2020-03-19 ENCOUNTER — Other Ambulatory Visit: Payer: Self-pay | Admitting: Physician Assistant

## 2020-03-19 LAB — TSH: TSH: 0.4 u[IU]/mL (ref 0.35–4.50)

## 2020-03-19 NOTE — Progress Notes (Signed)
Please call to let patient know that the thyroid  results are normal and no further action needed

## 2020-03-20 ENCOUNTER — Other Ambulatory Visit: Payer: Self-pay | Admitting: *Deleted

## 2020-03-20 DIAGNOSIS — E063 Autoimmune thyroiditis: Secondary | ICD-10-CM

## 2020-03-20 MED ORDER — SYNTHROID 125 MCG PO TABS: ORAL_TABLET | ORAL | 1 refills | Status: DC

## 2020-03-21 ENCOUNTER — Encounter: Payer: Self-pay | Admitting: Dietician

## 2020-03-21 ENCOUNTER — Other Ambulatory Visit: Payer: Self-pay

## 2020-03-21 ENCOUNTER — Encounter: Payer: Self-pay | Attending: Endocrinology | Admitting: Dietician

## 2020-03-21 DIAGNOSIS — E119 Type 2 diabetes mellitus without complications: Secondary | ICD-10-CM | POA: Insufficient documentation

## 2020-03-21 NOTE — Progress Notes (Signed)
Patient was seen on 03/21/2020  for the first of a series of three diabetes self-management courses at the [facility].  Patient Education Plan per assessed needs and concerns is to attend three course education program for Diabetes Self Management Education.  The following learning objectives were met by the patient during this class:  Describe diabetes, types of diabetes and pathophysiology  State some common risk factors for diabetes  Defines the role of glucose and insulin  Describe the relationship between diabetes and cardiovascular and other risks  State the members of the Healthcare Team  States the rationale for glucose monitoring and when to test  State their individual Target Range  State the importance of logging glucose readings and how to interpret the readings  Identifies A1C target  Explain the correlation between A1c and eAG values  State symptoms and treatment of high blood glucose and low blood glucose  Explain proper technique for glucose testing and identify proper sharps disposal  Handouts given during class include:  How to Thrive:  A Guide for Your Journey with Diabetes by the ADA  Meal Plan Card and carbohydrate content list  Dietary intake form  Low Sodium Flavoring Tips  Types of Fats  Dining Out  Label reading  Snack list  Planning a balanced meal  The diabetes portion plate  Diabetes Resources  A1c to eAG Conversion Chart  Blood Glucose Log  Diabetes Recommended Care Schedule  Support Group  Diabetes Success Plan  Core Class Satisfaction Survey   Follow-Up Plan:  Attend core 2   

## 2020-03-28 ENCOUNTER — Encounter: Payer: Self-pay | Admitting: Dietician

## 2020-03-28 ENCOUNTER — Other Ambulatory Visit: Payer: Self-pay

## 2020-03-28 DIAGNOSIS — E119 Type 2 diabetes mellitus without complications: Secondary | ICD-10-CM

## 2020-03-31 ENCOUNTER — Encounter: Payer: Self-pay | Admitting: Dietician

## 2020-03-31 NOTE — Progress Notes (Signed)
Patient was seen on 03/28/2020 for the second of a series of three diabetes self-management courses at the [facility]. The following learning objectives were met by the patient during this class:   Describe the role of different macronutrients on glucose  Explain how carbohydrates affect blood glucose  State what foods contain the most carbohydrates  Demonstrate carbohydrate counting  Demonstrate how to read Nutrition Facts food label  Describe effects of various fats on heart health  Describe the importance of good nutrition for health and healthy eating strategies  Describe techniques for managing your shopping, cooking and meal planning  List strategies to follow meal plan when dining out  Describe the effects of alcohol on glucose and how to use it safely  Goals:  Follow Diabetes Meal Plan as instructed  Aim to spread carbs evenly throughout the day  Aim for 3 meals per day and snacks as needed Include lean protein foods to meals/snacks  Monitor glucose levels as instructed by your doctor   Follow-Up Plan:  Attend Core 3  Work towards following your personal food plan.

## 2020-04-08 ENCOUNTER — Ambulatory Visit: Payer: Self-pay | Admitting: Gastroenterology

## 2020-04-18 NOTE — Progress Notes (Signed)
Referring Provider: Dorothy Puffer, FNP Primary Care Physician:  Billie Ruddy, MD Primary Gastroenterologist:  Dr. Gala Romney  Chief Complaint  Patient presents with  . abnormal lft  . Abdominal Pain    right lower and left upper    HPI:   Amy Lester is a 49 y.o. female presenting today at the request of Dorothy Puffer, FNP for abnormal LFTs and fatty liver.   Reviewed labs in our system.  Slight elevation of ALT and alk phos in November 2020.  Most recent labs in June 2021 with AST 70 (H), ALT 97 (H), alk phos 111, total bilirubin 0.2.  Platelets 304.  Last imaging in our system was CT A/P with contrast 07/30/2019 revealing probable diffuse hepatic steatosis, no definite liver surface irregularity, no liver masses.  No biliary ductal dilation.  Risk factors for fatty liver include diabetes, HTN, HLD, obesity with BMI >30.   Reviewed information received with referral: Labs 01/23/2020: AST 95 the same (H), ALT 143 (H), total bilirubin 0.4, alk phos 155 (H).  See lab section below for additional results. Ultrasound abdomen complete 11/16/2019: Hepatomegaly and hepatic steatosis, CBD within normal limits, no intrahepatic ductal dilation, pancreas and spleen normal.  Today:   Abdominal Pain: Intermittent LUQ or RLQ abdominal pain. LUQ pain is more frequent. Present for a year or more.  Can be a couple times a day or once every couple of days to a week. Last for 10 minutes and resolves. Feels like a "tensing pull, cramping."  No identified triggers.  Can be sitting watching TV and it happens. Not triggered by meals. Not associated with BMs. BMs every 2 days. Stool is "normal". This is her baseline. No blood in the stool. No black stool unless she eats blueberries.   Taking Bentyl when she has stomach pain. Doesn't notice that it has made a difference in her abdominal pain.   Taking omeprazole 80 mg in the morning. Has been taking this for quite a while. Used to have reflux  into her throat but symptoms have been under good control for a while. No dysphagia.    No regular nasuea. No vomiting.   Taking diclofenac couple times a week. Rare ibuprofen.   Elevated LFTs:   No swelling in LE or abdomen, confusion, jaundice, bruising or bleeding.  Alcohol: Once a a year at [employer].  IV/intranasal drug use: None.  Tattoos: None.  Tylenol: 500 mg 2-3 times a week.  OTC Supplements: Red yeast rice, B1 and B2 vitamins, and vitamin C   Prior Colonoscopy: Colonsocopy 4 years ago in [facility] in [place]. No polyps. Diverticulosis. Father with colon cancer in his early 24s.    Past Medical History:  Diagnosis Date  . Asthma   . Blood transfusion without reported diagnosis   . Chronic migraine   . Common migraine with intractable migraine 02/16/2019  . Diabetes mellitus without complication ([place])   . Endometriosis   . Fatty liver   . GERD (gastroesophageal reflux disease)   . Hyperlipidemia   . Hypertension   . Hypothyroidism   . Incontinence   . Irregular heartbeat   . Multiple sclerosis ([place])   . Palpitations   . Pre-diabetes   . Sleep apnea     Past Surgical History:  Procedure Laterality Date  . ABDOMINAL HYSTERECTOMY    . CESAREAN SECTION     x1  . CHOLECYSTECTOMY    . ENDOMETRIAL ABLATION    . FOOT SURGERY Left 06/2018  foot reconstruction  . REPLACEMENT TOTAL KNEE Left     Current Outpatient Medications  Medication Sig Dispense Refill  . albuterol (VENTOLIN HFA) 108 (90 Base) MCG/ACT inhaler Inhale 3 puffs into the lungs every 6 (six) hours as needed for wheezing or shortness of breath. 3 g 1  . cetirizine (ZYRTEC) 10 MG tablet Take 1 tablet (10 mg total) by mouth daily. 90 tablet 0  . clotrimazole-betamethasone (LOTRISONE) cream Apply 1 application topically as needed. (Patient taking differently: Apply 1 application topically as needed (Outbreak). ) 45 g 2  . Cyanocobalamin (VITAMIN B12 PO) Take by mouth every 30 (thirty)  days.    . diclofenac (CATAFLAM) 50 MG tablet Take 1 tablet (50 mg total) by mouth 2 (two) times daily as needed. 20 tablet 1  . dicyclomine (BENTYL) 20 MG tablet Take 1 tablet (20 mg total) by mouth 2 (two) times daily. (Patient taking differently: Take 20 mg by mouth 2 (two) times daily as needed. ) 20 tablet 0  . Erenumab-aooe (AIMOVIG, 140 MG DOSE, [place]) Inject into the skin every 30 (thirty) days.     Marland Kitchen estradiol (EVAMIST) 1.53 MG/SPRAY transdermal spray Place 3 sprays onto the skin daily.    Marland Kitchen ezetimibe (ZETIA) 10 MG tablet Take 10 mg by mouth daily.     Marland Kitchen glipiZIDE (GLUCOTROL) 10 MG tablet Take 10 mg by mouth daily.     Marland Kitchen LEVEMIR FLEXTOUCH 100 UNIT/ML FlexPen Inject 15 Units into the skin in the morning and at bedtime.    Marland Kitchen lisinopril (ZESTRIL) 5 MG tablet Take 1 tablet (5 mg total) by mouth daily. 90 tablet 1  . metFORMIN (GLUCOPHAGE) 1000 MG tablet Take 1,000 mg by mouth 2 (two) times daily.    . metoprolol tartrate (LOPRESSOR) 25 MG tablet Take 25 mg by mouth in the morning.     . metoprolol tartrate (LOPRESSOR) 50 MG tablet Take 50 mg by mouth every evening.     . metroNIDAZOLE (METROGEL) 1 % gel Apply 1 application topically at bedtime.     . montelukast (SINGULAIR) 10 MG tablet Take 10 mg by mouth every morning.    . Multiple Vitamins-Minerals (MULTIVITAMIN WITH MINERALS) tablet Take 1 tablet by mouth daily.    Marland Kitchen nystatin cream (MYCOSTATIN) Apply 1 application topically daily as needed for dry skin.    Marland Kitchen Olopatadine HCl (PAZEO OP) Apply to eye daily.    Marland Kitchen omeprazole (PRILOSEC) 40 MG capsule Take 40 mg by mouth daily. Taking 80 mg in there morning    . ondansetron (ZOFRAN ODT) 4 MG disintegrating tablet Take 1 tablet (4 mg total) by mouth every 8 (eight) hours as needed for nausea or vomiting. 20 tablet 0  . ondansetron (ZOFRAN) 8 MG tablet Take 8 mg by mouth as needed for nausea or vomiting.    . Red Yeast Rice Extract (RED YEAST RICE PO) Take 1 tablet by mouth daily.     . Riboflavin  (VITAMIN B-2 PO) Take 1 tablet by mouth daily.     . rizatriptan (MAXALT) 10 MG tablet Take 1 tablet (10 mg total) by mouth 3 (three) times daily as needed for migraine. May repeat in 2 hours if needed 27 tablet 1  . Semaglutide,0.25 or 0.5MG/DOS, (OZEMPIC, 0.25 OR 0.5 MG/DOSE,) 2 MG/1.5ML SOPN Inject 0.5 mg into the skin once a week.    Marland Kitchen SYNTHROID 125 MCG tablet Take two tablets (220m total) by mouth days 1-6 and skip day 7. 180 tablet 1  . TRETINOIN  EX Apply topically daily.    Marland Kitchen triamcinolone cream (KENALOG) 0.1 % Apply 1 application topically 2 (two) times daily as needed. (Patient taking differently: Apply 1 application topically 2 (two) times daily as needed (rash). ) 45 g 0  . triamterene-hydrochlorothiazide (MAXZIDE-25) 37.5-25 MG tablet Take 1 tablet by mouth daily. 90 tablet 1   No current facility-administered medications for this visit.    Allergies as of 04/19/2020 - Review Complete 04/19/2020  Allergen Reaction Noted  . Percocet [oxycodone-acetaminophen] Nausea And Vomiting 11/24/2018    Family History  Problem Relation Age of Onset  . Heart attack Mother   . Stroke Mother   . Cancer Mother        breast cancer  . Hyperlipidemia Mother   . Hypothyroidism Mother   . Cancer Father        lymph nodes, liver cancer  . Colon cancer Father        early 44s  . Other Daughter        Fine Gold Type II Syndrome and cognetive issues  . Memory loss Daughter   . Other Son        Fine Gold Syndrome and BPES  . Diabetes Son 22       IDDM  . Hypothyroidism Sister   . Cancer Sister   . Hyperthyroidism Neg Hx     Social History   Socioeconomic History  . Marital status: Married    Spouse name: Not on file  . Number of children: Not on file  . Years of education: 9  . Highest education level: Not on file  Occupational History  . Not on file  Tobacco Use  . Smoking status: Never Smoker  . Smokeless tobacco: Never Used  Vaping Use  . Vaping Use: Never used    Substance and Sexual Activity  . Alcohol use: Yes    Comment: once a year  . Drug use: Never  . Sexual activity: Not on file  Other Topics Concern  . Not on file  Social History Narrative   Left handed    Caffeine  1 soda per day    Lives at home with husband and adult daughter   Social Determinants of Health   Financial Resource Strain:   . Difficulty of Paying Living Expenses:   Food Insecurity:   . Worried About Charity fundraiser in the Last Year:   . Arboriculturist in the Last Year:   Transportation Needs:   . Film/video editor (Medical):   Marland Kitchen Lack of Transportation (Non-Medical):   Physical Activity:   . Days of Exercise per Week:   . Minutes of Exercise per Session:   Stress:   . Feeling of Stress :   Social Connections:   . Frequency of Communication with Friends and Family:   . Frequency of Social Gatherings with Friends and Family:   . Attends Religious Services:   . Active Member of Clubs or Organizations:   . Attends Archivist Meetings:   Marland Kitchen Marital Status:   Intimate Partner Violence:   . Fear of Current or Ex-Partner:   . Emotionally Abused:   Marland Kitchen Physically Abused:   . Sexually Abused:     Review of Systems: Gen: Denies any fever, chills, cold or flulike symptoms, lightheadedness, dizziness, presyncope, syncope. CV: Denies chest pain or heart palpitations. Resp: Denies shortness of breath or cough. GI: See HPI GU : Reports 3 days of increased urinary frequency and mild  dysuria. Psych: Denies depression, anxiety Heme: See HPI  Physical Exam: BP (!) 144/87   Pulse 76   Temp (!) 97.1 F (36.2 C) (Temporal)   Ht '5\' 4"'  (1.626 m)   Wt 210 lb 9.6 oz (95.5 kg)   BMI 36.15 kg/m  General:   Alert and oriented. Pleasant and cooperative. Well-nourished and well-developed.  Head:  Normocephalic and atraumatic. Eyes:  Without icterus, sclera clear and conjunctiva pink.  Ears:  Normal auditory acuity. Lungs:  Clear to auscultation  bilaterally. No wheezes, rales, or rhonchi. No distress.  Heart:  S1, S2 present without murmurs appreciated.  Abdomen:  +BS, soft, and non-distended.  Mild TTP in LUQ.  Minimal TTP in RLQ.  No HSM noted. No guarding or rebound. No masses appreciated.  Rectal:  Deferred  Msk:  Symmetrical without gross deformities. Normal posture. Extremities:  Without edema. Neurologic:  Alert and  oriented x4;  grossly normal neurologically. Skin:  Intact without significant lesions or rashes. Psych: Normal mood and affect.  Labs:  01/23/2020: CMP: Glucose 360, BUN 14, creatinine 0.91, sodium 134, potassium 4.3, chloride 98, calcium 9.6, albumin 4.3, total bilirubin 0.4, alk phos 155, AST 95, ALT 143 Hemoglobin A1c 11.9 TSH 0.678, free T4 1.54  12/29/2019: Total cholesterol 207, triglycerides 203, HDL 38, LDL 133  11/15/2019: Hepatitis A antibody IgM negative Hepatitis B surface antigen negative Hepatitis B core antibody IgM negative  11/27/2019: GGT 57  09/21/2019: CBC: WBC 8.4, hemoglobin 13.5, hematocrit 40.3, MCV 89, MCH 29.9, MCHC 33.5, platelets 326

## 2020-04-19 ENCOUNTER — Encounter: Payer: Self-pay | Admitting: Gastroenterology

## 2020-04-19 ENCOUNTER — Other Ambulatory Visit: Payer: Self-pay

## 2020-04-19 ENCOUNTER — Ambulatory Visit (INDEPENDENT_AMBULATORY_CARE_PROVIDER_SITE_OTHER): Payer: Self-pay | Admitting: Gastroenterology

## 2020-04-19 VITALS — BP 144/87 | HR 76 | Temp 97.1°F | Ht 64.0 in | Wt 210.6 lb

## 2020-04-19 DIAGNOSIS — K219 Gastro-esophageal reflux disease without esophagitis: Secondary | ICD-10-CM

## 2020-04-19 DIAGNOSIS — R109 Unspecified abdominal pain: Secondary | ICD-10-CM

## 2020-04-19 DIAGNOSIS — R7989 Other specified abnormal findings of blood chemistry: Secondary | ICD-10-CM | POA: Insufficient documentation

## 2020-04-19 DIAGNOSIS — R300 Dysuria: Secondary | ICD-10-CM

## 2020-04-19 NOTE — Patient Instructions (Addendum)
Please have labs completed.  We will call you with results.  Please decrease omeprazole to 40 mg once daily. Continue to monitor your acid reflux symptoms and let me know if symptoms worsen.  Limit diclofenac as much as possible and avoid all other NSAIDs including ibuprofen, Aleve, Advil, Goody powders, naproxen, and anything that says "NSAID" on the package.  Stop Bentyl as you do not feel this is helping your abdominal pain.  Add Benefiber or Metamucil daily to help with bowel regularity.  I am going to request your colonoscopy records to determine when your next colonoscopy is due.  Instructions for fatty liver: Recommend 1-2# weight loss per week until ideal body weight through exercise & diet. Low fat/cholesterol diet.   Avoid sweets, sodas, fruit juices, sweetened beverages like tea, etc.  try to increase exercise as tolerated. Continue to avoid alcohol. Avoid over-the-counter supplements and herbal teas.  Continue to monitor your abdominal pain and let me know if you have any worsening symptoms.  We can consider repeat imaging.  May also need to consider an upper endoscopy but will wait on this until receiving colonoscopy records as procedures can be done at the same time if colonoscopy as needed.  [name], PA-C [facility]   Fatty Liver Disease  Fatty liver disease occurs when too much fat has built up in your liver cells. Fatty liver disease is also called hepatic steatosis or steatohepatitis. The liver removes harmful substances from your bloodstream and produces fluids that your body needs. It also helps your body use and store energy from the food you eat. In many cases, fatty liver disease does not cause symptoms or problems. It is often diagnosed when tests are being done for other reasons. However, over time, fatty liver can cause inflammation that may lead to more serious liver problems, such as scarring of the liver (cirrhosis) and liver  failure. Fatty liver is associated with insulin resistance, increased body fat, high blood pressure (hypertension), and high cholesterol. These are features of metabolic syndrome and increase your risk for stroke, diabetes, and heart disease. What are the causes? This condition may be caused by:  Drinking too much alcohol.  Poor nutrition.  Obesity.  Cushing's syndrome.  Diabetes.  High cholesterol.  Certain drugs.  Poisons.  Some viral infections.  Pregnancy. What increases the risk? You are more likely to develop this condition if you:  Abuse alcohol.  Are overweight.  Have diabetes.  Have hepatitis.  Have a high triglyceride level.  Are pregnant. What are the signs or symptoms? Fatty liver disease often does not cause symptoms. If symptoms do develop, they can include:  Fatigue.  Weakness.  Weight loss.  Confusion.  Abdominal pain.  Nausea and vomiting.  Yellowing of your skin and the white parts of your eyes (jaundice).  Itchy skin. How is this diagnosed? This condition may be diagnosed by:  A physical exam and medical history.  Blood tests.  Imaging tests, such as an ultrasound, CT scan, or MRI.  A liver biopsy. A small sample of liver tissue is removed using a needle. The sample is then looked at under a microscope. How is this treated? Fatty liver disease is often caused by other health conditions. Treatment for fatty liver may involve medicines and lifestyle changes to manage conditions such as:  Alcoholism.  High cholesterol.  Diabetes.  Being overweight or obese. Follow these instructions at home:   Do not drink alcohol. If you have trouble quitting, ask your health  care provider how to safely quit with the help of medicine or a supervised program. This is important to keep your condition from getting worse.  Eat a healthy diet as told by your health care provider. Ask your health care provider about working with a diet and  nutrition specialist (dietitian) to develop an eating plan.  Exercise regularly. This can help you lose weight and control your cholesterol and diabetes. Talk to your health care provider about an exercise plan and which activities are best for you.  Take over-the-counter and prescription medicines only as told by your health care provider.  Keep all follow-up visits as told by your health care provider. This is important. Contact a health care provider if: You have trouble controlling your:  Blood sugar. This is especially important if you have diabetes.  Cholesterol.  Drinking of alcohol. Get help right away if:  You have abdominal pain.  You have jaundice.  You have nausea and vomiting.  You vomit blood or material that looks like coffee grounds.  You have stools that are black, tar-like, or bloody. Summary  Fatty liver disease develops when too much fat builds up in the cells of your liver.  Fatty liver disease often causes no symptoms or problems. However, over time, fatty liver can cause inflammation that may lead to more serious liver problems, such as scarring of the liver (cirrhosis).  You are more likely to develop this condition if you abuse alcohol, are pregnant, are overweight, have diabetes, have hepatitis, or have high triglyceride levels.  Contact your health care provider if you have trouble controlling your weight, blood sugar, cholesterol, or drinking of alcohol. This information is not intended to replace advice given to you by your health care provider. Make sure you discuss any questions you have with your health care provider. Document Revised: 08/06/2017 Document Reviewed: 06/02/2017 Elsevier Patient Education  2020 Elsevier Inc.  Nonalcoholic Fatty Liver Disease Diet, Adult Nonalcoholic fatty liver disease is a condition that causes fat to build up in and around the liver. The disease makes it harder for the liver to work the way that it should.  Following a healthy diet can help to keep nonalcoholic fatty liver disease under control. It can also help to prevent or improve conditions that are associated with the disease, such as heart disease, diabetes, high blood pressure, and abnormal cholesterol levels. Along with regular exercise, this diet:  Promotes weight loss.  Helps to control blood sugar levels.  Helps to improve the way that the body uses insulin. What are tips for following this plan? Reading food labels Always check food labels for:  The amount of saturated fat in a food. You should limit your intake of saturated fat. Saturated fat is found in foods that come from animals, including meat and dairy products such as butter, cheese, and whole milk.  The amount of fiber in a food. You should choose high-fiber foods such as fruits, vegetables, and whole grains. Try to get 25-30 grams (g) of fiber a day.  Cooking  When cooking, use heart-healthy oils that are high in monounsaturated fats. These include olive oil, canola oil, and avocado oil.  Limit frying or deep-frying foods. Cook foods using healthy methods such as baking, boiling, steaming, and grilling instead. Meal planning  You may want to keep track of how many calories you take in. Eating the right amount of calories will help you achieve a healthy weight. Meeting with a registered dietitian can help you get  started.  Limit how often you eat takeout and fast food. These foods are usually very high in fat, salt, and sugar.  Use the glycemic index (GI) to plan your meals. The index tells you how quickly a food will raise your blood sugar. Choose low-GI foods (GI less than 55). These foods take a longer time to raise blood sugar. A registered dietitian can help you identify foods lower on the GI scale. Lifestyle  You may want to follow a Mediterranean diet. This diet includes a lot of vegetables, lean meats or fish, whole grains, fruits, and healthy oils and  fats. What foods can I eat?  Fruits Bananas. Apples. Oranges. Grapes. Papaya. Mango. Pomegranate. Kiwi. Grapefruit. Cherries. Vegetables Lettuce. Spinach. Peas. Beets. Cauliflower. Cabbage. Broccoli. Carrots. Tomatoes. Squash. Eggplant. Herbs. Peppers. Onions. Cucumbers. Brussels sprouts. Yams and sweet potatoes. Beans. Lentils. Grains Whole wheat or whole-grain foods, including breads, crackers, cereals, and pasta. Stone-ground whole wheat. Unsweetened oatmeal. Bulgur. Barley. Quinoa. Brown or wild rice. Corn or whole wheat flour tortillas. Meats and other proteins Lean meats. Poultry. Tofu. Seafood and shellfish. Dairy Low-fat or fat-free dairy products, such as yogurt, cottage cheese, or cheese. Beverages Water. Sugar-free drinks. Tea. Coffee. Low-fat or skim milk. Milk alternatives, such as soy or almond milk. Real fruit juice. Fats and oils Avocado. Canola or olive oil. Nuts and nut butters. Seeds. Seasonings and condiments Mustard. Relish. Low-fat, low-sugar ketchup and barbecue sauce. Low-fat or fat-free mayonnaise. Sweets and desserts Sugar-free sweets. The items listed above may not be a complete list of foods and beverages you can eat. Contact a dietitian for more information. What foods should I limit or avoid? Meats and other proteins Limit red meat to 1-2 times a week. Dairy [employer]. Fats and oils Palm oil and coconut oil. Fried foods. Other foods Processed foods. Foods that contain a lot of salt or sodium. Sweets and desserts Sweets that contain sugar. Beverages Sweetened drinks, such as sweet tea, milkshakes, iced sweet drinks, and sodas. Alcohol. The items listed above may not be a complete list of foods and beverages you should avoid. Contact a dietitian for more information. Where to find more information The [employer] of Diabetes and Digestive and Kidney Diseases: [email or web address] Summary  Nonalcoholic fatty liver disease is a condition that  causes fat to build up in and around the liver.  Following a healthy diet can help to keep nonalcoholic fatty liver disease under control. Your diet should be rich in fruits, vegetables, whole grains, and lean proteins.  Limit your intake of saturated fat. Saturated fat is found in foods that come from animals, including meat and dairy products such as butter, cheese, and whole milk.  This diet promotes weight loss, helps to control blood sugar levels, and helps to improve the way that the body uses insulin. This information is not intended to replace advice given to you by your health care provider. Make sure you discuss any questions you have with your health care provider. Document Revised: 12/16/2018 Document Reviewed: 09/15/2018 Elsevier Patient Education  2020 [employer].

## 2020-04-21 NOTE — Assessment & Plan Note (Signed)
History of GERD.  Symptoms are well controlled.  Patient is currently taking omeprazole 80 mg in the morning although PCP has prescribed Protonix 40 mg daily.  No alarm symptoms.  Plan: Decreased omeprazole to 40 mg daily 30 minutes before breakfast. Advised she monitor for breakthrough reflux symptoms and let me know if this occurs.

## 2020-04-21 NOTE — Assessment & Plan Note (Signed)
Patient reports 3 days of increased urinary frequency and mild dysuria.  Will obtain urinalysis with urine culture for further evaluation.

## 2020-04-21 NOTE — Assessment & Plan Note (Addendum)
49 year old female reporting 1+ year history of intermittent LUQ and intermittent RLQ abdominal pain.  LUQ abdominal pain is more frequent.  No identified trigger.  No associated nausea or vomiting.  Describes it as a "tensing pull, cramping" that lasts for about 10 minutes and self resolves.  BMs every 2 days without alarm features.  History of GERD that is well controlled on omeprazole 80 mg once every morning (although prescription is for 40 mg daily).  Bentyl prescribed by PCP but she does not note that this provides any relief.  She does note dysuria and increased urinary frequency for the last few days. Reports prior colonoscopy in [place] without polyps.  Dad had colon cancer in his 5s.  CT November 2020 without acute findings. Abdominal exam with mild TTP in LUQ, minimal TTP in RLQ.  Etiology of the LUQ abdominal pain is not clear.  Would be less suspicious for gastritis or PUD in the setting of omeprazole daily but cannot rule this out.  Not consistent with pancreatitis.  She does have history of MS and I wonder if this is a manifestation of MS.  UTI would not be contributing to her chronic abdominal pain but will need to evaluate for this considering her report of dysuria.  Plan: CBC, CMP, Lipase, UA, urine culture Decrease omeprazole to 40 mg daily 30 minutes before breakfast. Stop Bentyl as this does not provide any help with abdominal pain. Add Benefiber or Metamucil daily to help with bowel regularity. Limit diclofenac as much as possible and avoid all other NSAIDs. Request colonoscopy records. Advise she continue to monitor her abdominal pain and let me know if she has any worsening. Consider EGD.  We will wait for colonoscopy records.  If she is due for colonoscopy, we may complete colonoscopy and EGD at the same time.

## 2020-04-21 NOTE — Assessment & Plan Note (Signed)
History of elevated LFTs.  Slight elevation of ALT and alk phos noted in November 2020.  Most recent labs in June 2021 with AST 70 (H), ALT 97 (H), alk phos 111, total bilirubin 0.2.  Platelets 304.  Ultrasound March 2021 ordered by PCP revealed hepatomegaly and hepatic steatosis, CBD within normal limits, no intrahepatic ductal dilation, pancreas and spleen normal.  No significant alcohol use.  No drug use.  No tattoos.  Intermittent Tylenol use.  OTC supplements include red yeast rice, B1 and B2 vitamins, vitamin C.  No signs or symptoms of advanced liver disease.   Most suspicious that elevated LFTs are secondary to fatty liver.  Risk factors include diabetes with A1c 11.9 in May 2021, HLD, and obesity with BMI >30.   Plan: Update labs including CBC, CMP, hepatitis A antibody, hepatitis B serologies, hepatitis C antibody, iron panel with ferritin. Counseled on fatty liver:  Recommend 1-2# weight loss per week until ideal body weight through exercise & diet.  Low fat/cholesterol diet.    Avoid sweets, sodas, fruit juices, sweetened beverages like tea, etc.  Gradually increase exercise from 15 min daily up to 1 hr per day 5 days/week.  Continue to avoid alcohol. Avoid OTC supplements and herbal teas.

## 2020-04-24 ENCOUNTER — Encounter: Payer: Self-pay | Admitting: Cardiology

## 2020-04-24 ENCOUNTER — Ambulatory Visit (INDEPENDENT_AMBULATORY_CARE_PROVIDER_SITE_OTHER): Payer: Self-pay | Admitting: Cardiology

## 2020-04-24 VITALS — BP 130/80 | HR 90 | Ht 64.0 in | Wt 211.4 lb

## 2020-04-24 DIAGNOSIS — R002 Palpitations: Secondary | ICD-10-CM

## 2020-04-24 DIAGNOSIS — E782 Mixed hyperlipidemia: Secondary | ICD-10-CM

## 2020-04-24 DIAGNOSIS — I10 Essential (primary) hypertension: Secondary | ICD-10-CM

## 2020-04-24 MED ORDER — METOPROLOL TARTRATE 50 MG PO TABS: ORAL_TABLET | ORAL | 1 refills | Status: DC

## 2020-04-24 NOTE — Progress Notes (Signed)
Clinical Summary Amy Lester is a 49 y.o.female seen today for follow up of the following medical problems.   1. Palpitations - seen in ER with episode of palpitations. Occurred while laying in bed. Recent increase in symptoms. Heavy hard beats. Symptoms lasted about 10 minutes. Felt weak all over. In general episodes occur few times a week.  - rare cafffeine, no EtOH. - followed previously in [place]. Reports wearing previous holter monitors - EKG SRin ER. TSH 1.7. hstrop neg, K 3.9  - last monitor 2-3 years ago she believes. She thinks overall benign.   03/2019 event monitor no arrhythmias  - changed to toprol last visit but made her have fatigue, she went back to lopressor, only taking 25mg  in AM and 50mg  in PM  - still symptoms at times. Lasts just a few minutes - no specific triggers   2. Hyperlipidemia - she is compliant with statin 02/2019 TC 246 HDL 45 TG 188 LDL 163  - elevated LFTs, seeing GI  - off statin. Had some recent elevated LFTs. Dull discomfort left arm and leg, nonspecific symptoms  3. LE edema - primarily left sided, she has had prior left ankle surgery  05/2019 echo LVEF >65%, indet diastolic function - some swelling at times.  - prior leg injuries bilatearlly  4. HTN - she is compliant with meds   Past Medical History:  Diagnosis Date  . Asthma   . Blood transfusion without reported diagnosis   . Chronic migraine   . Common migraine with intractable migraine 02/16/2019  . Diabetes mellitus without complication (HCC)   . Endometriosis   . Fatty liver   . GERD (gastroesophageal reflux disease)   . Hyperlipidemia   . Hypertension   . Hypothyroidism   . Incontinence   . Irregular heartbeat   . Multiple sclerosis (HCC)   . Palpitations   . Pre-diabetes   . Sleep apnea      Allergies  Allergen Reactions  . Percocet [Oxycodone-Acetaminophen] Nausea And Vomiting     Current Outpatient Medications  Medication Sig Dispense  Refill  . albuterol (VENTOLIN HFA) 108 (90 Base) MCG/ACT inhaler Inhale 3 puffs into the lungs every 6 (six) hours as needed for wheezing or shortness of breath. 3 g 1  . cetirizine (ZYRTEC) 10 MG tablet Take 1 tablet (10 mg total) by mouth daily. 90 tablet 0  . clotrimazole-betamethasone (LOTRISONE) cream Apply 1 application topically as needed. (Patient taking differently: Apply 1 application topically as needed (Outbreak). ) 45 g 2  . Cyanocobalamin (VITAMIN B12 PO) Take by mouth every 30 (thirty) days.    . diclofenac (CATAFLAM) 50 MG tablet Take 1 tablet (50 mg total) by mouth 2 (two) times daily as needed. 20 tablet 1  . dicyclomine (BENTYL) 20 MG tablet Take 1 tablet (20 mg total) by mouth 2 (two) times daily. (Patient taking differently: Take 20 mg by mouth 2 (two) times daily as needed. ) 20 tablet 0  . Erenumab-aooe (AIMOVIG, 140 MG DOSE, [place]) Inject into the skin every 30 (thirty) days.     06/2019 estradiol (EVAMIST) 1.53 MG/SPRAY transdermal spray Place 3 sprays onto the skin daily.    04/18/2019 ezetimibe (ZETIA) 10 MG tablet Take 10 mg by mouth daily.     Marland Kitchen glipiZIDE (GLUCOTROL) 10 MG tablet Take 10 mg by mouth daily.     Marland Kitchen LEVEMIR FLEXTOUCH 100 UNIT/ML FlexPen Inject 15 Units into the skin in the morning and at bedtime.    Marland Kitchen  lisinopril (ZESTRIL) 5 MG tablet Take 1 tablet (5 mg total) by mouth daily. 90 tablet 1  . metFORMIN (GLUCOPHAGE) 1000 MG tablet Take 1,000 mg by mouth 2 (two) times daily.    . metoprolol tartrate (LOPRESSOR) 25 MG tablet Take 25 mg by mouth in the morning.     . metoprolol tartrate (LOPRESSOR) 50 MG tablet Take 50 mg by mouth every evening.     . metroNIDAZOLE (METROGEL) 1 % gel Apply 1 application topically at bedtime.     . montelukast (SINGULAIR) 10 MG tablet Take 10 mg by mouth every morning.    . Multiple Vitamins-Minerals (MULTIVITAMIN WITH MINERALS) tablet Take 1 tablet by mouth daily.    Marland Kitchen nystatin cream (MYCOSTATIN) Apply 1 application topically daily as needed for  dry skin.    Marland Kitchen Olopatadine HCl (PAZEO OP) Apply to eye daily.    Marland Kitchen omeprazole (PRILOSEC) 40 MG capsule Take 40 mg by mouth daily. Taking 80 mg in there morning    . ondansetron (ZOFRAN ODT) 4 MG disintegrating tablet Take 1 tablet (4 mg total) by mouth every 8 (eight) hours as needed for nausea or vomiting. 20 tablet 0  . ondansetron (ZOFRAN) 8 MG tablet Take 8 mg by mouth as needed for nausea or vomiting.    . Red Yeast Rice Extract (RED YEAST RICE PO) Take 1 tablet by mouth daily.     . Riboflavin (VITAMIN B-2 PO) Take 1 tablet by mouth daily.     . rizatriptan (MAXALT) 10 MG tablet Take 1 tablet (10 mg total) by mouth 3 (three) times daily as needed for migraine. May repeat in 2 hours if needed 27 tablet 1  . Semaglutide,0.25 or 0.5MG /DOS, (OZEMPIC, 0.25 OR 0.5 MG/DOSE,) 2 MG/1.5ML SOPN Inject 0.5 mg into the skin once a week.    Marland Kitchen SYNTHROID 125 MCG tablet Take two tablets (250mg  total) by mouth days 1-6 and skip day 7. 180 tablet 1  . TRETINOIN EX Apply topically daily.    triamcinolone cream (KENALOG) 0.1 % Apply 1 application topically 2 (two) times daily as needed. (Patient taking differently: Apply 1 application topically 2 (two) times daily as needed (rash). ) 45 g 0  . triamterene-hydrochlorothiazide (MAXZIDE-25) 37.5-25 MG tablet Take 1 tablet by mouth daily. 90 tablet 1   No current facility-administered medications for this visit.     Past Surgical History:  Procedure Laterality Date  . ABDOMINAL HYSTERECTOMY    . CESAREAN SECTION     x1  . CHOLECYSTECTOMY    . ENDOMETRIAL ABLATION    . FOOT SURGERY Left 06/2018   foot reconstruction  . REPLACEMENT TOTAL KNEE Left      Allergies  Allergen Reactions  . Percocet [Oxycodone-Acetaminophen] Nausea And Vomiting      Family History  Problem Relation Age of Onset  . Heart attack Mother   . Stroke Mother   . Cancer Mother        breast cancer  . Hyperlipidemia Mother   . Hypothyroidism Mother   . Cancer Father         lymph nodes, liver cancer  . Colon cancer Father        early 8s  . Other Daughter        Fine Gold Type II Syndrome and cognetive issues  . Memory loss Daughter   . Other Son        Fine Gold Syndrome and BPES  . Diabetes Son 24  IDDM  . Hypothyroidism Sister   . Cancer Sister   . Hyperthyroidism Neg Hx      Social History Ms. Ems reports that she has never smoked. She has never used smokeless tobacco. Ms. Mondesir reports current alcohol use.   Review of Systems CONSTITUTIONAL: No weight loss, fever, chills, weakness or fatigue.  HEENT: Eyes: No visual loss, blurred vision, double vision or yellow sclerae.No hearing loss, sneezing, congestion, runny nose or sore throat.  SKIN: No rash or itching.  CARDIOVASCULAR: per hpi RESPIRATORY: No shortness of breath, cough or sputum.  GASTROINTESTINAL: No anorexia, nausea, vomiting or diarrhea. No abdominal pain or blood.  GENITOURINARY: No burning on urination, no polyuria NEUROLOGICAL: No headache, dizziness, syncope, paralysis, ataxia, numbness or tingling in the extremities. No change in bowel or bladder control.  MUSCULOSKELETAL: No muscle, back pain, joint pain or stiffness.  LYMPHATICS: No enlarged nodes. No history of splenectomy.  PSYCHIATRIC: No history of depression or anxiety.  ENDOCRINOLOGIC: No reports of sweating, cold or heat intolerance. No polyuria or polydipsia.  Marland Kitchen   Physical Examination Today's Vitals   04/24/20 1305  BP: 130/80  Pulse: 90  SpO2: 98%  Weight: 211 lb 6.4 oz (95.9 kg)  Height: 5\' 4"  (1.626 m)   Body mass index is 36.29 kg/m.  Gen: resting comfortably, no acute distress HEENT: no scleral icterus, pupils equal round and reactive, no palptable cervical adenopathy,  CV: RRR, no m/r/g, no jvd Resp: Clear to auscultation bilaterally GI: abdomen is soft, non-tender, non-distended, normal bowel sounds, no hepatosplenomegaly MSK: extremities are warm, no edema.  Skin: warm, no  rash Neuro:  no focal deficits Psych: appropriate affect   Diagnostic Studies 05/2019 echo IMPRESSIONS   1. The left ventricle has hyperdynamic systolic function, with an ejection fraction of >65%. The cavity size was normal. Left ventricular diastolic parameters were normal. No evidence of left ventricular regional wall motion abnormalities. 2. The right ventricle has normal systolic function. The cavity was normal. There is no increase in right ventricular wall thickness. 3. The aortic valve is tricuspid. Mild aortic annular calcification noted. 4. The mitral valve is grossly normal. 5. The tricuspid valve is grossly normal. 6. The aorta is normal unless otherwise noted.   03/2019 event monitor  14 day event monitor  Min HR 55, Max HR 161, Avg HR 80  Reported symptoms correlate with sinus rhythm  No significant arrhythmias    Assessment and Plan   1. Palpitations - benign event monitor.  - ongoing symptoms, will increase lopressor to 50mg  in am and 75mg  in pm -EKG today shows NSR    2. HTN - at goal, continue current meds   3. Hyperlipidemia -she stopped taking due to nonspecific arm and leg pains. Of note ongoing workup for elevated LFTs by GI - hold statin for now, would look to retry in the future.         04/2019, M.D.

## 2020-04-24 NOTE — Patient Instructions (Signed)
Your physician recommends that you schedule a follow-up appointment in: 4 MONTHS WITH DR [facility]  Your physician has recommended you make the following change in your medication:   CHANGE LOPRESSOR 50 MG IN THE MORNING AND 75 MG IN THE EVENING   Thank you for choosing [facility]!!

## 2020-04-28 ENCOUNTER — Telehealth: Payer: Self-pay | Admitting: Gastroenterology

## 2020-04-28 NOTE — Telephone Encounter (Signed)
Received EGD report from [facility], [place] dated 11/30/2014.  Indication: Foreign body sensation/chicken  Findings: Hypopharynx appeared normal, no evidence of food or foreign body in esophagus, irregular Z-line at 36 cm s/p biopsied, erythematous mucosa in the antrum and body of stomach s/p biopsy, normal examined duodenum.  Pathology: Mid esophagus biopsy benign, GE junction biopsy with mild chronic inflammation, gastric antrum and body biopsy with mild chronic inflammation, negative H. Pylori.  Facesheet on records states EGD is all patient had at [facility].   Helmut Muster:  Please let patient know I have received and reviewed EGD records from 2016. We also requested colonoscopy records; however, [facility] states she did not have colonoscopy there.   Please see if patient may have had colonoscopy at a different hospital. Please also remind patient to have labs completed.

## 2020-04-30 NOTE — Telephone Encounter (Signed)
Lipase is included in her blood work. This will check her pancreas.

## 2020-04-30 NOTE — Telephone Encounter (Signed)
Spoke with pt. Pt is aware that Lipase level will check her pancrease.

## 2020-04-30 NOTE — Telephone Encounter (Signed)
Spoke with pt. Pt is aware that her EGD report was reviewed. Pt states she didn't have the TCS there and is going to call back tomorrow with the name of the office where her TCS was completed. Pt mentioned that she is going to have labs completed but wants to know if any of the blood work will check her pancrease? Pt said she would like her pancrease levels checked.

## 2020-05-02 ENCOUNTER — Encounter: Payer: Self-pay | Attending: Endocrinology

## 2020-05-02 DIAGNOSIS — E119 Type 2 diabetes mellitus without complications: Secondary | ICD-10-CM | POA: Insufficient documentation

## 2020-05-09 ENCOUNTER — Other Ambulatory Visit: Payer: Self-pay

## 2020-05-09 ENCOUNTER — Telehealth: Payer: Self-pay | Admitting: *Deleted

## 2020-05-09 ENCOUNTER — Encounter: Payer: Self-pay | Attending: Endocrinology | Admitting: Dietician

## 2020-05-09 ENCOUNTER — Encounter: Payer: Self-pay | Admitting: Dietician

## 2020-05-09 ENCOUNTER — Encounter: Payer: Self-pay | Admitting: Neurology

## 2020-05-09 ENCOUNTER — Ambulatory Visit (INDEPENDENT_AMBULATORY_CARE_PROVIDER_SITE_OTHER): Payer: Self-pay | Admitting: Neurology

## 2020-05-09 VITALS — BP 126/84 | HR 85 | Ht 64.0 in | Wt 210.0 lb

## 2020-05-09 DIAGNOSIS — Z6838 Body mass index (BMI) 38.0-38.9, adult: Secondary | ICD-10-CM

## 2020-05-09 DIAGNOSIS — F0781 Postconcussional syndrome: Secondary | ICD-10-CM

## 2020-05-09 DIAGNOSIS — G43719 Chronic migraine without aura, intractable, without status migrainosus: Secondary | ICD-10-CM

## 2020-05-09 DIAGNOSIS — S161XXS Strain of muscle, fascia and tendon at neck level, sequela: Secondary | ICD-10-CM

## 2020-05-09 DIAGNOSIS — E119 Type 2 diabetes mellitus without complications: Secondary | ICD-10-CM | POA: Insufficient documentation

## 2020-05-09 HISTORY — DX: Postconcussional syndrome: F07.81

## 2020-05-09 MED ORDER — AIMOVIG (140 MG DOSE) 70 MG/ML ~~LOC~~ SOAJ
140.0000 mg | SUBCUTANEOUS | 5 refills | Status: DC
Start: 2020-05-09 — End: 2022-11-10

## 2020-05-09 MED ORDER — RIZATRIPTAN BENZOATE 10 MG PO TABS
10.0000 mg | ORAL_TABLET | Freq: Three times a day (TID) | ORAL | 5 refills | Status: DC | PRN
Start: 2020-05-09 — End: 2020-09-11

## 2020-05-09 MED ORDER — VENLAFAXINE HCL ER 37.5 MG PO CP24: ORAL_CAPSULE | ORAL | 3 refills | Status: DC

## 2020-05-09 NOTE — Telephone Encounter (Signed)
Aimovig printed Rx faxed to [facility]. Received a receipt of confirmation.
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
## 2020-05-09 NOTE — Patient Instructions (Addendum)
When you eat eggs consider reducing from 4 to 2 whites and 2 whole eggs and add a piece of toast.  Continue to add the vegetables. Consider reducing the portion size of the oatmeal.  Eat slowly.  See if this helps with portion control and feelings of fullness. Be sure to have balanced meals with protein, carbohydrate and vegetables.   When hungry between meals, ask, "Am I hungry or eating for another reason?"  If hungry, consider non-starchy vegetables as these will not effect your blood sugar.  Consider Armchair exercises (you tube or TV "Sit and Be Fit" for example)  Continue to check your blood sugar Continue to take your medication as prescribed. Follow up with Dr. Lucianne Muss on 05/14/20 about your insulin.   PMA - Positive Mental Attitude  Take care of yourself, find time to purposefully relax, do something that you enjoy!  I look forward to seeing you again!

## 2020-05-09 NOTE — Patient Instructions (Signed)
We will add effexor for the headache.

## 2020-05-09 NOTE — Progress Notes (Signed)
Diabetes Self-Management Education  Visit Type: Follow-up  Appt. Start Time: 0915 Appt. End Time: 1030  05/09/2020  [name], identified by name and date of birth, is a 49 y.o. female with a diagnosis of Diabetes: Type 2.   ASSESSMENT Patient is here today alone.  She is a patient of Dr. Lucianne Muss.   She states that she is reducing the Levemir to 20 units as she is waiting on financial assistance from [employer]. She attended the first 2 diabetes core classes but missed the third. She states that blood glucose is still high even though she is taking the insulin.   She states that she is hungry all of the time. Currently she reports increased stress.  Increased stress eating. Used to love to run but increased weakness and broke her foot this year which required total reconstruction.  She is unable to exercise much because of this.  Encouraged Armchair exercises.  History includes:  Type 2 Diabetes, MS, chronic headaches, fatty liver, endometriosis, HTN, HLD, OSA on C-pap. Medications include Glipizide, Metformin, Ozempic, and Levemir.  She has decreased the Levemir from 30 units bid with fasting readings of 117-130 to 20 units bid as she is rationing this as financial assistance from [employer] has not come through yet.  Fasting blood sugars are 170-190 on this lower dose.  She is to change to U-100 20 units once daily when financial assistance comes through.   A1C 9.5% 02/19/2020 increased from 7.8% 07/2019.  Messaged Dr. Lucianne Muss and received approval for a Sample. Tresiba U-100 pen provided to patient with instruction to take 20 units once daily per MD note. Lot [id number] Expiration date:  12/25/2020 Signed out by myself  Weight hx: 210 lbs 05/09/2020 204 lbs July 2021 230's highest adult weight  (2016) 140 lowest adult weight (30's)  Patient lives with her husband and adult daughter.    They moved from [place] recently.  Weight 210 lb (95.3 kg). Body mass index is 36.05  kg/m.   Diabetes Self-Management Education - 05/09/20 1009      Visit Information   Visit Type Follow-up      Initial Visit   Diabetes Type Type 2      Pre-Education Assessment   Patient understands the diabetes disease and treatment process. Needs Review    Patient understands incorporating nutritional management into lifestyle. Needs Review    Patient undertands incorporating physical activity into lifestyle. Needs Review    Patient understands using medications safely. Needs Review    Patient understands monitoring blood glucose, interpreting and using results Needs Review    Patient understands prevention, detection, and treatment of acute complications. Needs Review    Patient understands prevention, detection, and treatment of chronic complications. Needs Review    Patient understands how to develop strategies to address psychosocial issues. Needs Review    Patient understands how to develop strategies to promote health/change behavior. Needs Review      Complications   How often do you check your blood sugar? 1-2 times/day    Fasting Blood glucose range (mg/dL) 790-240    Postprandial Blood glucose range (mg/dL) >973   532-992   Number of hypoglycemic episodes per month 0    Number of hyperglycemic episodes per week 12    Can you tell when your blood sugar is high? Yes    What do you do if your blood sugar is high? drink water      Dietary Intake   Breakfast 4 cups  old fashioned oatmeal, chia seeds, flax seeds, cinnamon, 2 tsp cranberries OR 4 eggs, peppers, onions, bacon bits OR occasional waffle with regular syrup and butter   8   Snack (morning) similar to the afternoon    Lunch sandwich or Pizza or can of soup   11-1   Snack (afternoon) mini candy bar or granola bar or banana or trail mix or orange or fruit leather or moon pie or chex mix or potato chips    Dinner sloppy joe or cheeseburger with bacon on bun, peas, potato chips OR baked chicken or meat loaf, noodles or  rice or bread sticks and vegetables OR spaghetti or lasagne, vegetables, salad   5-8   Snack (evening) ice cream, popsickles    Beverage(s) water, occasional juice, 1 can soda once per week, sparkeling water      Exercise   Exercise Type ADL's    How many days per week to you exercise? 0    How many minutes per day do you exercise? 0    Total minutes per week of exercise 0      Patient Education   Previous Diabetes Education Yes (please comment)   core classes 1 adn 2   Disease state  Explored patient's options for treatment of their diabetes    Nutrition management  Role of diet in the treatment of diabetes and the relationship between the three main macronutrients and blood glucose level;Meal options for control of blood glucose level and chronic complications.    Physical activity and exercise  Role of exercise on diabetes management, blood pressure control and cardiac health.;Helped patient identify appropriate exercises in relation to his/her diabetes, diabetes complications and other health issue.    Medications Taught/reviewed insulin injection, site rotation, insulin storage and needle disposal.;Reviewed patients medication for diabetes, action, purpose, timing of dose and side effects.    Psychosocial adjustment Worked with patient to identify barriers to care and solutions;Role of stress on diabetes;Identified and addressed patients feelings and concerns about diabetes    Personal strategies to promote health Lifestyle issues that need to be addressed for better diabetes care      Individualized Goals (developed by patient)   Nutrition General guidelines for healthy choices and portions discussed    Physical Activity Exercise 3-5 times per week;15 minutes per day    Medications take my medication as prescribed    Monitoring  test my blood glucose as discussed    Reducing Risk examine blood glucose patterns;increase portions of healthy fats    Health Coping ask for help with  (comment);discuss diabetes with (comment)   MD, RD,  CDCES     Patient Self-Evaluation of Goals - Patient rates self as meeting previously set goals (% of time)   Nutrition 25 - 50%    Physical Activity 25 - 50%    Medications 50 - 75 %    Monitoring >75%    Problem Solving 50 - 75 %    Reducing Risk 50 - 75 %    Health Coping 50 - 75 %      Outcomes   Expected Outcomes Demonstrated interest in learning. Expect positive outcomes    Future DMSE 4-6 wks    Program Status Not Completed           Individualized Plan for Diabetes Self-Management Training:   Learning Objective:  Patient will have a greater understanding of diabetes self-management. Patient education plan is to attend individual and/or group sessions per assessed needs and  concerns.   Plan:   Patient Instructions  When you eat eggs consider reducing from 4 to 2 whites and 2 whole eggs and add a piece of toast.  Continue to add the vegetables. Consider reducing the portion size of the oatmeal.  Eat slowly.  See if this helps with portion control and feelings of fullness. Be sure to have balanced meals with protein, carbohydrate and vegetables.   When hungry between meals, ask, "Am I hungry or eating for another reason?"  If hungry, consider non-starchy vegetables as these will not effect your blood sugar.  Consider Armchair exercises (you tube or TV "Sit and Be Fit" for example)  Continue to check your blood sugar Continue to take your medication as prescribed. Follow up with Dr. Lucianne Muss on 05/14/20 about your insulin.   PMA - Positive Mental Attitude  Take care of yourself, find time to purposefully relax, do something that you enjoy!  I look forward to seeing you again!    Expected Outcomes:  Demonstrated interest in learning. Expect positive outcomes  Education material provided: Full packet given at Core Class 1  If problems or questions, patient to contact team via:  Phone  Future DSME appointment: 4-6  wks

## 2020-05-10 ENCOUNTER — Other Ambulatory Visit: Payer: Self-pay

## 2020-05-10 MED ORDER — TRESIBA FLEXTOUCH 100 UNIT/ML ~~LOC~~ SOPN: PEN_INJECTOR | SUBCUTANEOUS | 0 refills | Status: DC

## 2020-05-14 ENCOUNTER — Ambulatory Visit (INDEPENDENT_AMBULATORY_CARE_PROVIDER_SITE_OTHER): Payer: Self-pay | Admitting: Endocrinology

## 2020-05-14 ENCOUNTER — Other Ambulatory Visit: Payer: Self-pay

## 2020-05-14 ENCOUNTER — Telehealth: Payer: Self-pay | Admitting: *Deleted

## 2020-05-14 ENCOUNTER — Encounter: Payer: Self-pay | Admitting: Endocrinology

## 2020-05-14 ENCOUNTER — Telehealth: Payer: Self-pay | Admitting: Endocrinology

## 2020-05-14 VITALS — BP 130/80 | HR 88 | Ht 64.0 in | Wt 210.8 lb

## 2020-05-14 DIAGNOSIS — E1165 Type 2 diabetes mellitus with hyperglycemia: Secondary | ICD-10-CM

## 2020-05-14 DIAGNOSIS — Z794 Long term (current) use of insulin: Secondary | ICD-10-CM

## 2020-05-14 NOTE — Progress Notes (Signed)
Patient ID: Amy Lester, female   DOB: 08/18/1971, 49 y.o.   MRN: [id number]           Reason for Appointment: Endocrinology follow-up  Referring PCP: Abbe Amsterdam   History of Present Illness:          Date of diagnosis of type 2 diabetes mellitus:   2018      Background history:   She apparently had been told to have prediabetes prior to diagnosis of diabetes.  However she did not have gestational diabetes with her 3 pregnancies She remembers her A1c being 9% at the time of diagnosis but records are not available She was tried on Metformin but this caused abdominal discomfort and she did not take this and was not given any other treatment She thinks her blood sugars improved with better diet  Recent history:    Most recent A1c is 9.5      Non-insulin hypoglycemic drugs the patient is taking are: Metformin 500 mg twice daily, Ozempic 0.25 mg weekly  INSULIN regimen: Tresiba 20 units twice daily   Current management, blood sugar patterns and problems identified:  She was started with [place] instead of Levemir with a sample but she has not had samples consistently and she is still waiting for the patient assistance supplies  However she thinks her blood sugars were fairly good fasting compared to higher doses of Levemir  She is now on 0.5 mg Ozempic without any side effects, still using samples  Although she still has good satiety level with this she is not losing weight  She was told to keep a record of her blood sugar levels on the log sheet but she did not bring this with her  She is not able to do much walking for exercise        Side effects from medications have been: Abdominal distress from Metformin               Glucose monitoring:  done 0-1 times a day         Glucometer:  Generic    Blood Glucose readings by recall recently as below:    PRE-MEAL Fasting Lunch Dinner Bedtime Overall  Glucose range: 90-120    89-220  Mean/median:        POST-MEAL  PC Breakfast PC Lunch PC Dinner  Glucose range:   180s  Mean/median:        Dietician visit, most recent: 05/09/2020  Weight history:  Wt Readings from Last 3 Encounters:  05/14/20 210 lb 12.8 oz (95.6 kg)  05/09/20 210 lb (95.3 kg)  05/09/20 210 lb (95.3 kg)    Glycemic control:   Lab Results  Component Value Date   HGBA1C 9.5 (A) 02/19/2020   HGBA1C 7.8 (H) 07/24/2019   HGBA1C 6.4 (A) 04/12/2019   Lab Results  Component Value Date   MICROALBUR 0.9 07/24/2019   LDLCALC 163 (H) 02/24/2019   CREATININE 0.76 02/13/2020   Lab Results  Component Value Date   MICRALBCREAT 0.8 07/24/2019    No results found for: FRUCTOSAMINE  HYPOTHYROIDISM: See review of systems    No visits with results within 1 Week(s) from this visit.  Latest known visit with results is:  Office Visit on 03/18/2020  Component Date Value Ref Range Status  . TSH 03/18/2020 0.40  0.35 - 4.50 uIU/mL Final    Allergies as of 05/14/2020      Reactions   Percocet [oxycodone-acetaminophen] Nausea And Vomiting  Medication List       Accurate as of May 14, 2020  2:03 PM. If you have any questions, ask your nurse or doctor.        Aimovig (140 MG Dose) 70 MG/ML Soaj Generic drug: Erenumab-aooe Inject 140 mg into the skin every 30 (thirty) days.   albuterol 108 (90 Base) MCG/ACT inhaler Commonly known as: VENTOLIN HFA Inhale 3 puffs into the lungs every 6 (six) hours as needed for wheezing or shortness of breath.   cetirizine 10 MG tablet Commonly known as: ZYRTEC Take 1 tablet (10 mg total) by mouth daily.   clotrimazole-betamethasone cream Commonly known as: Lotrisone Apply 1 application topically as needed. What changed: reasons to take this   diclofenac 50 MG tablet Commonly known as: CATAFLAM Take 1 tablet (50 mg total) by mouth 2 (two) times daily as needed.   dicyclomine 20 MG tablet Commonly known as: BENTYL Take 1 tablet (20 mg total) by mouth 2 (two) times  daily. What changed:   when to take this  reasons to take this   estradiol 1.53 MG/SPRAY transdermal spray Commonly known as: EVAMIST Place 3 sprays onto the skin daily.   ezetimibe 10 MG tablet Commonly known as: ZETIA Take 10 mg by mouth daily.   glipiZIDE 10 MG tablet Commonly known as: GLUCOTROL Take 10 mg by mouth daily.   lisinopril 5 MG tablet Commonly known as: ZESTRIL Take 1 tablet (5 mg total) by mouth daily.   metFORMIN 1000 MG tablet Commonly known as: GLUCOPHAGE Take 1,000 mg by mouth 2 (two) times daily.   metoprolol tartrate 50 MG tablet Commonly known as: LOPRESSOR Take 50 mg in the morning and 75 mg in the evening What changed: additional instructions   metroNIDAZOLE 1 % gel Commonly known as: METROGEL Apply 1 application topically at bedtime.   montelukast 10 MG tablet Commonly known as: SINGULAIR Take 10 mg by mouth every morning.   multivitamin with minerals tablet Take 1 tablet by mouth daily.   nystatin cream Commonly known as: MYCOSTATIN Apply 1 application topically daily as needed for dry skin.   omeprazole 40 MG capsule Commonly known as: PRILOSEC Take 40 mg by mouth daily. Taking 80 mg in there morning   ondansetron 4 MG disintegrating tablet Commonly known as: Zofran ODT Take 1 tablet (4 mg total) by mouth every 8 (eight) hours as needed for nausea or vomiting.   ondansetron 8 MG tablet Commonly known as: ZOFRAN Take 8 mg by mouth as needed for nausea or vomiting.   Ozempic (0.25 or 0.5 MG/DOSE) 2 MG/1.5ML Sopn Generic drug: Semaglutide(0.25 or 0.5MG /DOS) Inject 0.5 mg into the skin once a week.   PAZEO OP Apply to eye daily.   RED YEAST RICE PO Take 1 tablet by mouth daily.   rizatriptan 10 MG tablet Commonly known as: Maxalt Take 1 tablet (10 mg total) by mouth 3 (three) times daily as needed for migraine.   Synthroid 125 MCG tablet Generic drug: levothyroxine Take two tablets (250mg  total) by mouth days 1-6 and  skip day 7.   Evaristo Bury FlexTouch 100 UNIT/ML FlexTouch Pen Generic drug: insulin degludec Inject 20 units under the skin twice daily.   TRETINOIN EX Apply topically daily.   triamcinolone cream 0.1 % Commonly known as: KENALOG Apply 1 application topically 2 (two) times daily as needed. What changed: reasons to take this   triamterene-hydrochlorothiazide 37.5-25 MG tablet Commonly known as: MAXZIDE-25 Take 1 tablet by mouth daily.   venlafaxine  XR 37.5 MG 24 hr capsule Commonly known as: Effexor XR One tablet daily for one week, then take 2 a day   VITAMIN B-2 PO Take 1 tablet by mouth daily.   VITAMIN B12 PO Take by mouth every 30 (thirty) days.       Allergies:  Allergies  Allergen Reactions  . Percocet [Oxycodone-Acetaminophen] Nausea And Vomiting    Past Medical History:  Diagnosis Date  . Asthma   . Blood transfusion without reported diagnosis   . Chronic migraine   . Common migraine with intractable migraine 02/16/2019  . Diabetes mellitus without complication (HCC)   . Endometriosis   . Fatty liver   . GERD (gastroesophageal reflux disease)   . Hyperlipidemia   . Hypertension   . Hypothyroidism   . Incontinence   . Irregular heartbeat   . Multiple sclerosis (HCC)   . Palpitations   . Postconcussive syndrome 05/09/2020  . Pre-diabetes   . Sleep apnea     Past Surgical History:  Procedure Laterality Date  . ABDOMINAL HYSTERECTOMY    . CESAREAN SECTION     x1  . CHOLECYSTECTOMY    . ENDOMETRIAL ABLATION    . FOOT SURGERY Left 06/2018   foot reconstruction  . REPLACEMENT TOTAL KNEE Left     Family History  Problem Relation Age of Onset  . Heart attack Mother   . Stroke Mother   . Cancer Mother        breast cancer  . Hyperlipidemia Mother   . Hypothyroidism Mother   . Cancer Father        lymph nodes, liver cancer  . Colon cancer Father        early 67s  . Other Daughter        Fine Gold Type II Syndrome and cognetive issues  .  Memory loss Daughter   . Other Son        Fine Gold Syndrome and BPES  . Diabetes Son 22       IDDM  . Hypothyroidism Sister   . Cancer Sister   . Hyperthyroidism Neg Hx     Social History:  reports that she has never smoked. She has never used smokeless tobacco. She reports current alcohol use. She reports that she does not use drugs.   Review of Systems  HYPOTHYROIDISM: She initially had symptoms of fatigue, hair loss and difficulties with weight when she was a teenager She also has a strong family history of hypothyroidism She likely has been on thyroid supplements for about 25 years or more.  Fairly consistently  Although she has been on Synthroid mostly about 2 years ago an outside physician told her to take liothyronine also that she did not know why and she did not feel any better with starting this combination She previously was on levothyroxine 225 mcg and Cytomel 5 mcg for over 2 years  This was changed to levothyroxine 250 mcg on her visit in 11/20 Now taking this 6/7 days a week With this her TSH was back to normal at 0.4    Lab Results  Component Value Date   TSH 0.40 03/18/2020   TSH 0.11 (L) 02/19/2020   TSH 0.81 07/24/2019   FREET4 1.04 07/24/2019   FREET4 0.73 02/24/2019   FREET4 0.62 (L) 11/25/2018    Lipid history: She was started on Lipitor 10 mg by her PCP but she thinks it caused some upper body muscle aches She was also told previously to start  on red rice yeast which she is taking    Lab Results  Component Value Date   CHOL 246 (H) 02/24/2019   HDL 45 02/24/2019   LDLCALC 163 (H) 02/24/2019   TRIG 188 (H) 02/24/2019   CHOLHDL 5.5 02/24/2019           Hypertension: Has been present since age 55 Treated with lisinopril, metoprolol and Maxzide and followed by local physician and cardiologist Blood pressure readings:  BP Readings from Last 3 Encounters:  05/14/20 130/80  05/09/20 126/84  04/24/20 130/80  .  Lab Results  Component Value  Date   K 3.9 02/13/2020     Most recent foot exam: 11/20  Currently known complications of diabetes:?  Neuropathy  LABS:  No visits with results within 1 Week(s) from this visit.  Latest known visit with results is:  Office Visit on 03/18/2020  Component Date Value Ref Range Status  . TSH 03/18/2020 0.40  0.35 - 4.50 uIU/mL Final    Physical Examination:  BP 130/80 (BP Location: Left Arm, Patient Position: Sitting, Cuff Size: Large)   Pulse 88   Ht 5\' 4"  (1.626 m)   Wt 210 lb 12.8 oz (95.6 kg)   BMI 36.18 kg/m       ASSESSMENT:  Diabetes type 2 with obesity  See history of present illness for detailed discussion of current diabetes management, blood sugar patterns and problems identified  A1c is last 9.5  Current treatment regimen is twice a day Tresiba insulin and now 0.25 mg weekly Ozempic along with her low-dose Metformin No objective labs available She has not had her 10-11-1978 consistently but she reports fairly good blood sugars overall  HYPOTHYROIDISM: She had been continued on 250 mcg levothyroxine 6 days a week     PLAN:    Continue Ozempic 0.5 mg weekly Needs to keep a record of her blood sugars   We will try to help her with the patient assistance program for Ozempic and Evaristo Bury as she is not able to get any answer from this program as yet Sample of Tresiba U-200 given and she will continue 20 units twice daily  There are no Patient Instructions on file for this visit.      10-11-1978 05/14/2020, 2:03 PM   Note: This office note was prepared with Dragon voice recognition system technology. Any transcriptional errors that result from this process are unintentional.

## 2020-05-14 NOTE — Telephone Encounter (Signed)
Contacted Amy Lester for assistance with the Patient Assistance for [place]. Patient stated she submitted for PA about 6 weeks ago and has not heard anything or received follow up if she was approved or can get a shipment. Amy Lester did respond stating there is a backlog and that I should receive a call from supervisor hopefully by the end of the week. Will follow up with pt once I have more information

## 2020-05-14 NOTE — Telephone Encounter (Signed)
Medication Sample . Pt came into office and picked up sample of Tresiba per Dr. Remus Blake instructions.  . Medication was signed out according to sample medication policy.  . Medication was labeled using approved label. . Medication sample was added to patient's medication list as a sample medication that had been given. . Medication was explained to patient and patient verbalized understanding of how to take properly.  . Pt was also instructed to call this office if any questions or concerns arise. Pt did verbalize understanding of this.  Lot number for medication is: [id number]  Expiration for medication is: 09/2021

## 2020-05-24 ENCOUNTER — Other Ambulatory Visit: Payer: Self-pay

## 2020-05-24 ENCOUNTER — Ambulatory Visit
Admission: EM | Admit: 2020-05-24 | Discharge: 2020-05-24 | Disposition: A | Discharge status: Home / Self Care | Payer: Self-pay | Attending: Emergency Medicine | Admitting: Emergency Medicine

## 2020-05-24 DIAGNOSIS — N644 Mastodynia: Secondary | ICD-10-CM

## 2020-05-24 DIAGNOSIS — H0100A Unspecified blepharitis right eye, upper and lower eyelids: Secondary | ICD-10-CM

## 2020-05-24 DIAGNOSIS — H0100B Unspecified blepharitis left eye, upper and lower eyelids: Secondary | ICD-10-CM

## 2020-05-24 MED ORDER — PREDNISONE 20 MG PO TABS: 20.0000 mg | ORAL_TABLET | Freq: Two times a day (BID) | ORAL | 0 refills | Status: AC

## 2020-05-24 MED ORDER — POLYMYXIN B-TRIMETHOPRIM 10000-0.1 UNIT/ML-% OP SOLN: 1.0000 [drp] | Freq: Four times a day (QID) | OPHTHALMIC | 0 refills | Status: AC

## 2020-05-24 MED ORDER — ERYTHROMYCIN 5 MG/GM OP OINT: TOPICAL_OINTMENT | OPHTHALMIC | 0 refills | Status: DC

## 2020-05-24 NOTE — ED Provider Notes (Signed)
[facility]   [id number] 05/24/20 Arrival Time: 1500  CC: Eyelid swelling, redness and crusting; RT breast pain  SUBJECTIVE:  Amy Lester is a 49 y.o. female who presents with complaint of eyelid redness, crusting, and itching x 1 week.  Denies a precipitating event, trauma, or close contacts with similar symptoms.  Has tried cleaning without relief.  Denies aggravating factors.  Reports similar symptoms in the past.  Denies fever, chills, nausea, vomiting, eye pain, painful eye movements, halos, vision changes, double vision, FB sensation, periorbital erythema.    Also mentions RT breast/ underarm pain x 3 days.  Denies injury.  Describes as intermittent and sharp.  Denies alleviating or aggravating factors.  Denies similar symptoms in the past.  Denies fever, chills, nausea, vomiting, erythema, skin changes, dimpling, nipple discharge, LAD, SOB, chest pain, abdominal pain, changes in bowel or bladder function.    Mammo 8 months ago and was normal   ROS: As per HPI.  All other pertinent ROS negative.     Past Medical History:  Diagnosis Date  . Asthma   . Blood transfusion without reported diagnosis   . Chronic migraine   . Common migraine with intractable migraine 02/16/2019  . Diabetes mellitus without complication (HCC)   . Endometriosis   . Fatty liver   . GERD (gastroesophageal reflux disease)   . Hyperlipidemia   . Hypertension   . Hypothyroidism   . Incontinence   . Irregular heartbeat   . Multiple sclerosis (HCC)   . Palpitations   . Postconcussive syndrome 05/09/2020  . Pre-diabetes   . Sleep apnea    Past Surgical History:  Procedure Laterality Date  . ABDOMINAL HYSTERECTOMY    . CESAREAN SECTION     x1  . CHOLECYSTECTOMY    . ENDOMETRIAL ABLATION    . FOOT SURGERY Left 06/2018   foot reconstruction  . REPLACEMENT TOTAL KNEE Left    Allergies  Allergen Reactions  . Percocet [Oxycodone-Acetaminophen] Nausea And Vomiting   No current  facility-administered medications on file prior to encounter.   Current Outpatient Medications on File Prior to Encounter  Medication Sig Dispense Refill  . albuterol (VENTOLIN HFA) 108 (90 Base) MCG/ACT inhaler Inhale 3 puffs into the lungs every 6 (six) hours as needed for wheezing or shortness of breath. 3 g 1  . cetirizine (ZYRTEC) 10 MG tablet Take 1 tablet (10 mg total) by mouth daily. 90 tablet 0  . clotrimazole-betamethasone (LOTRISONE) cream Apply 1 application topically as needed. (Patient taking differently: Apply 1 application topically as needed (Outbreak). ) 45 g 2  . Cyanocobalamin (VITAMIN B12 PO) Take by mouth every 30 (thirty) days.    . diclofenac (CATAFLAM) 50 MG tablet Take 1 tablet (50 mg total) by mouth 2 (two) times daily as needed. 20 tablet 1  . dicyclomine (BENTYL) 20 MG tablet Take 1 tablet (20 mg total) by mouth 2 (two) times daily. (Patient taking differently: Take 20 mg by mouth 2 (two) times daily as needed. ) 20 tablet 0  . Erenumab-aooe (AIMOVIG, 140 MG DOSE,) 70 MG/ML SOAJ Inject 140 mg into the skin every 30 (thirty) days. 1.12 mL 5  . estradiol (EVAMIST) 1.53 MG/SPRAY transdermal spray Place 3 sprays onto the skin daily.    Marland Kitchen ezetimibe (ZETIA) 10 MG tablet Take 10 mg by mouth daily.     Marland Kitchen glipiZIDE (GLUCOTROL) 10 MG tablet Take 10 mg by mouth daily.     . insulin degludec (TRESIBA FLEXTOUCH) 100 UNIT/ML  FlexTouch Pen Inject 20 units under the skin twice daily. 15 mL 0  . lisinopril (ZESTRIL) 5 MG tablet Take 1 tablet (5 mg total) by mouth daily. 90 tablet 1  . metFORMIN (GLUCOPHAGE) 1000 MG tablet Take 1,000 mg by mouth 2 (two) times daily.    . metoprolol tartrate (LOPRESSOR) 50 MG tablet Take 50 mg in the morning and 75 mg in the evening (Patient taking differently: Patient is taking 50 mg in the morning and 25 mg in the evening) 225 tablet 1  . metroNIDAZOLE (METROGEL) 1 % gel Apply 1 application topically at bedtime.     . montelukast (SINGULAIR) 10 MG  tablet Take 10 mg by mouth every morning.    . Multiple Vitamins-Minerals (MULTIVITAMIN WITH MINERALS) tablet Take 1 tablet by mouth daily.    Marland Kitchen nystatin cream (MYCOSTATIN) Apply 1 application topically daily as needed for dry skin.    Marland Kitchen Olopatadine HCl (PAZEO OP) Apply to eye daily.    Marland Kitchen omeprazole (PRILOSEC) 40 MG capsule Take 40 mg by mouth daily. Taking 80 mg in there morning    . ondansetron (ZOFRAN ODT) 4 MG disintegrating tablet Take 1 tablet (4 mg total) by mouth every 8 (eight) hours as needed for nausea or vomiting. 20 tablet 0  . ondansetron (ZOFRAN) 8 MG tablet Take 8 mg by mouth as needed for nausea or vomiting.    . Red Yeast Rice Extract (RED YEAST RICE PO) Take 1 tablet by mouth daily.     . Riboflavin (VITAMIN B-2 PO) Take 1 tablet by mouth daily.     . rizatriptan (MAXALT) 10 MG tablet Take 1 tablet (10 mg total) by mouth 3 (three) times daily as needed for migraine. 10 tablet 5  . Semaglutide,0.25 or 0.5MG /DOS, (OZEMPIC, 0.25 OR 0.5 MG/DOSE,) 2 MG/1.5ML SOPN Inject 0.5 mg into the skin once a week.    Marland Kitchen SYNTHROID 125 MCG tablet Take two tablets (250mg  total) by mouth days 1-6 and skip day 7. 180 tablet 1  . TRETINOIN EX Apply topically daily.    triamcinolone cream (KENALOG) 0.1 % Apply 1 application topically 2 (two) times daily as needed. (Patient taking differently: Apply 1 application topically 2 (two) times daily as needed (rash). ) 45 g 0  . triamterene-hydrochlorothiazide (MAXZIDE-25) 37.5-25 MG tablet Take 1 tablet by mouth daily. 90 tablet 1  . venlafaxine XR (EFFEXOR XR) 37.5 MG 24 hr capsule One tablet daily for one week, then take 2 a day 60 capsule 3   Social History   Socioeconomic History  . Marital status: Married    Spouse name: Not on file  . Number of children: Not on file  . Years of education: 6  . Highest education level: Not on file  Occupational History  . Not on file  Tobacco Use  . Smoking status: Never Smoker  . Smokeless tobacco: Never  Used  Vaping Use  . Vaping Use: Never used  Substance and Sexual Activity  . Alcohol use: Yes    Comment: once a year  . Drug use: Never  . Sexual activity: Not on file  Other Topics Concern  . Not on file  Social History Narrative   Left handed    Caffeine  1 soda per day    Lives at home with husband and adult daughter   Social Determinants of Health   Financial Resource Strain:   . Difficulty of Paying Living Expenses: Not on file  Food Insecurity:   .  Worried About Programme researcher, broadcasting/film/video in the Last Year: Not on file  . Ran Out of Food in the Last Year: Not on file  Transportation Needs:   . Lack of Transportation (Medical): Not on file  . Lack of Transportation (Non-Medical): Not on file  Physical Activity:   . Days of Exercise per Week: Not on file  . Minutes of Exercise per Session: Not on file  Stress:   . Feeling of Stress : Not on file  Social Connections:   . Frequency of Communication with Friends and Family: Not on file  . Frequency of Social Gatherings with Friends and Family: Not on file  . Attends Religious Services: Not on file  . Active Member of Clubs or Organizations: Not on file  . Attends Banker Meetings: Not on file  . Marital Status: Not on file  Intimate Partner Violence:   . Fear of Current or Ex-Partner: Not on file  . Emotionally Abused: Not on file  . Physically Abused: Not on file  . Sexually Abused: Not on file   Family History  Problem Relation Age of Onset  . Heart attack Mother   . Stroke Mother   . Cancer Mother        breast cancer  . Hyperlipidemia Mother   . Hypothyroidism Mother   . Cancer Father        lymph nodes, liver cancer  . Colon cancer Father        early 33s  . Other Daughter        Fine Gold Type II Syndrome and cognetive issues  . Memory loss Daughter   . Other Son        Fine Gold Syndrome and BPES  . Diabetes Son 22       IDDM  . Hypothyroidism Sister   . Cancer Sister   . Hyperthyroidism  Neg Hx     OBJECTIVE:    Visual Acuity  Right Eye Distance:   Left Eye Distance:   Bilateral Distance:    Right Eye Near:   Left Eye Near:    Bilateral Near:      Vitals:   05/24/20 1551  BP: (!) 147/88  Pulse: 88  Resp: 20  Temp: 98.5 F (36.9 C)  SpO2: 97%    General appearance: alert; no distress Eyes: Erythema, and crusting to bilateral eyelids; no to slight conjunctival erythema. PERRL; EOMI without discomfort Neck: supple Lungs: clear to auscultation bilaterally Heart: regular rate and rhythm Chest wall: mildly TTP over RT underarm, no palpable lymph nodes; breast without obvious skin changes or erythema; no nipple inversions or retractions Skin: warm and dry Psychological: alert and cooperative; normal mood and affect   ASSESSMENT & PLAN:  1. Blepharitis of upper and lower eyelids of both eyes, unspecified type   2. Breast pain     Meds ordered this encounter  Medications  . predniSONE (DELTASONE) 20 MG tablet    Sig: Take 1 tablet (20 mg total) by mouth 2 (two) times daily with a meal for 5 days.    Dispense:  10 tablet    Refill:  0    Order Specific Question:   Supervising Provider    Answer:   Eustace Moore [[id number]]  . trimethoprim-polymyxin b (POLYTRIM) ophthalmic solution    Sig: Place 1 drop into both eyes 4 (four) times daily for 7 days.    Dispense:  10 mL    Refill:  0  Order Specific Question:   Supervising Provider    Answer:   Eustace Moore [[id number]]  . erythromycin ophthalmic ointment    Sig: Place a 1/2 inch ribbon of ointment into the lower eyelid.    Dispense:  3.5 g    Refill:  0    Order Specific Question:   Supervising Provider    Answer:   Eustace Moore [[id number]]     Eye irritation Continue warm compresses at home.  Soak a wash cloth in warm (not scalding) water and place it over the eyes. As the wash cloth cools, it should be rewarmed and replaced for a total of 5 to 10 minutes of soaking time. Warm  compresses should be applied two to four times a day as long as the patient has symptoms Perform lid washing: Either warm water or very dilute baby shampoo can be placed on a clean wash cloth, gauze pad, or cotton swab. Then be advised to gently clean along the lashes and lid margin to remove the accumulated material with care to avoid contacting the ocular surface. If shampoo is used, thorough rinsing is recommended. Vigorous washing should be avoided, as it may cause more irritation.  Prescribed erythromycin ointment.  Apply up to 6 times daily for 5-7 days, or until symptomatic improvement Polytrim eye drops prescribed Follow up with ophthalmology for further evaluation and management if symptoms persists Return or go to ER if you have any new or worsening symptoms such as fever, chills, redness, swelling, eye pain, painful eye movements, vision changes, etc...  Breast/ underarm pain: Symptoms may be musculoskeletal or nerve inflammation Prednisone prescribed.  Take as directed and to completion Follow up with PCP next week for rechec  Reviewed expectations re: course of current medical issues. Questions answered. Outlined signs and symptoms indicating need for more acute intervention. Patient verbalized understanding. After Visit Summary given.   Rennis Harding, PA-C 05/24/20 1621

## 2020-05-24 NOTE — ED Triage Notes (Signed)
Pt presents with eye redness and swelling with drainage, pt also has right breast

## 2020-05-24 NOTE — Discharge Instructions (Signed)
Eye irritation Continue warm compresses at home.  Soak a wash cloth in warm (not scalding) water and place it over the eyes. As the wash cloth cools, it should be rewarmed and replaced for a total of 5 to 10 minutes of soaking time. Warm compresses should be applied two to four times a day as long as the patient has symptoms Perform lid washing: Either warm water or very dilute baby shampoo can be placed on a clean wash cloth, gauze pad, or cotton swab. Then be advised to gently clean along the lashes and lid margin to remove the accumulated material with care to avoid contacting the ocular surface. If shampoo is used, thorough rinsing is recommended. Vigorous washing should be avoided, as it may cause more irritation.  Prescribed erythromycin ointment.  Apply up to 6 times daily for 5-7 days, or until symptomatic improvement Polytrim eye drops prescribed Follow up with ophthalmology for further evaluation and management if symptoms persists Return or go to ER if you have any new or worsening symptoms such as fever, chills, redness, swelling, eye pain, painful eye movements, vision changes, etc...  Breast/ underarm pain: Symptoms may be musculoskeletal or nerve inflammation Prednisone prescribed.  Take as directed and to completion Follow up with PCP next week for rechec

## 2020-06-04 ENCOUNTER — Telehealth: Payer: Self-pay | Admitting: Emergency Medicine

## 2020-06-04 NOTE — Telephone Encounter (Signed)
Called patient regarding [employer] application for Aimovig.  Patient stated she had received her portion in the mail and would fill it out and drop it off at our office to be faxed back to [facility].

## 2020-06-06 ENCOUNTER — Ambulatory Visit: Payer: Self-pay | Admitting: Dietician

## 2020-06-10 NOTE — Telephone Encounter (Signed)
Amy Lester, can we follow-up on location of last TCS? Also, please remind patient about having labs completed.

## 2020-06-10 NOTE — Telephone Encounter (Signed)
Spoke with pt. Pt is at the dentist office and asked if she could call back.

## 2020-06-14 ENCOUNTER — Encounter: Payer: Self-pay | Admitting: Dietician

## 2020-06-14 ENCOUNTER — Encounter: Payer: Self-pay | Attending: Endocrinology | Admitting: Dietician

## 2020-06-14 ENCOUNTER — Other Ambulatory Visit: Payer: Self-pay

## 2020-06-14 DIAGNOSIS — E119 Type 2 diabetes mellitus without complications: Secondary | ICD-10-CM | POA: Insufficient documentation

## 2020-06-14 NOTE — Patient Instructions (Signed)
Eat slowly.  See if this helps with portion control and feelings of fullness. Be sure to have balanced meals with protein, carbohydrate and vegetables.   When hungry between meals, ask, "Am I hungry or eating for another reason?"  If hungry, consider non-starchy vegetables as these will not effect your blood sugar.  Consider Armchair exercises (you tube or TV "Sit and Be Fit" for example)  Continue to check your blood sugar Continue to take your medication as prescribed.  PMA - Positive Mental Attitude             Take care of yourself, find time to purposefully relax, do something that you enjoy!  I look forward to seeing you again!

## 2020-06-14 NOTE — Progress Notes (Signed)
Diabetes Self-Management Education  Visit Type: (P) Follow-up  Appt. Start Time: 112 Appt. End Time: 1200  06/16/2020  Amy Lester, identified by name and date of birth, is a 49 y.o. female with a diagnosis of Diabetes: (P) Type 2.   ASSESSMENT She states that she has been working on what she is eating. She states that she feels ravenously hungry at times after a meal and at other times, has no appetite for the meal. More conscious about her eating but states that she could improve. Increased snacking each evening when she watches TV. She has decreased the portion sizes of breakfast and is satisfied. Stress has decreased but still present.  Mother is sick in another state.  She was able to get a financial assistance program through a pharmacy in [place] and is now able to get her insulin and Ozempic for $4.  History includes:  Type 2 Diabetes, MS, chronic headaches, fatty liver, endometriosis, HTN, HLD, OSA on C-pap. Medications include Glipizide, Metformin, Ozempic, and Tresiba U-200 20 units bid. A1C 9.5% 02/19/2020 increased from 7.8% 07/2019.  Weight hx:  210 lbs 05/09/2020 lbs 06/14/2020 204 lbs July 2021 230's highest adult weight  (2016) 140 lowest adult weight (30's)  Body Composition Scale Jun 14, 2020  Current Body Weight 213.8 lbs  Total Body Fat % 42.4  Visceral Fat 14  Fat-Free Mass % 57.5   Total Body Water % 43.2  Muscle-Mass lbs 30.1  BMI   Body Fat Displacement          Torso  lbs 56         Left Leg  lbs 11.2         Right Leg  lbs 11.2         Left Arm  lbs 5.6         Right Arm   lbs 5.6    Patient lives with her husband and adult daughter.    They moved from [place] recently.  Weight 213 lb (96.6 kg). Body mass index is 36.56 kg/m.   Diabetes Self-Management Education - 06/16/20 2000      Visit Information   Visit Type Follow-up (P)       Initial Visit   Diabetes Type Type 2 (P)     Are you currently following a meal plan? No  (P)     Are you taking your medications as prescribed? Yes (P)       Subsequent Visit   Since your last visit have you continued or begun to take your medications as prescribed? Yes (P)     Since your last visit have you had your blood pressure checked? Yes (P)     Since your last visit have you experienced any weight changes? Gain (P)            Individualized Plan for Diabetes Self-Management Training:   Learning Objective:  Patient will have a greater understanding of diabetes self-management. Patient education plan is to attend individual and/or group sessions per assessed needs and concerns.   Plan:   Patient Instructions  Eat slowly.  See if this helps with portion control and feelings of fullness. Be sure to have balanced meals with protein, carbohydrate and vegetables.   When hungry between meals, ask, "Am I hungry or eating for another reason?"  If hungry, consider non-starchy vegetables as these will not effect your blood sugar.  Consider Armchair exercises (you tube or TV "Sit and Be Fit" for example)  Continue  to check your blood sugar Continue to take your medication as prescribed.  PMA - Positive Mental Attitude             Take care of yourself, find time to purposefully relax, do something that you enjoy!  I look forward to seeing you again!   Expected Outcomes:     Education material provided: Meal plan card  If problems or questions, patient to contact team via:  Phone  Future DSME appointment:

## 2020-06-18 NOTE — Telephone Encounter (Signed)
Someone picked up pts phone and said they would have pt contact our office back.

## 2020-07-15 ENCOUNTER — Ambulatory Visit (INDEPENDENT_AMBULATORY_CARE_PROVIDER_SITE_OTHER): Payer: Self-pay | Admitting: Endocrinology

## 2020-07-15 ENCOUNTER — Other Ambulatory Visit: Payer: Self-pay

## 2020-07-15 ENCOUNTER — Encounter: Payer: Self-pay | Admitting: Endocrinology

## 2020-07-15 VITALS — BP 128/86 | HR 80 | Ht 64.0 in | Wt 217.2 lb

## 2020-07-15 DIAGNOSIS — E119 Type 2 diabetes mellitus without complications: Secondary | ICD-10-CM

## 2020-07-15 DIAGNOSIS — E063 Autoimmune thyroiditis: Secondary | ICD-10-CM

## 2020-07-15 LAB — POCT GLYCOSYLATED HEMOGLOBIN (HGB A1C): Hemoglobin A1C: 6.1 % — AB (ref 4.0–5.6)

## 2020-07-15 LAB — TSH: TSH: 1.89 u[IU]/mL (ref 0.35–4.50)

## 2020-07-15 NOTE — Progress Notes (Signed)
Patient ID: Amy Lester, female   DOB: Aug 26, 1971, 49 y.o.   MRN: [id number]           Reason for Appointment: Endocrinology follow-up  Referring PCP: Abbe Amsterdam   History of Present Illness:          Date of diagnosis of type 2 diabetes mellitus:   2018      Background history:   She apparently had been told to have prediabetes prior to diagnosis of diabetes.  However she did not have gestational diabetes with her 3 pregnancies She remembers her A1c being 9% at the time of diagnosis but records are not available She was tried on Metformin but this caused abdominal discomfort and she did not take this and was not given any other treatment She thinks her blood sugars improved with better diet  Recent history:      Non-insulin hypoglycemic drugs the patient is taking are: Metformin 500 mg twice daily, Ozempic 0.5 mg weekly  INSULIN regimen: Tresiba 20 units twice daily  Her A1c is surprisingly better at 6.1 compared to 9.5  Current management, blood sugar patterns and problems identified:  She was able to get her Ozempic to her patient assistance program  No nausea with this  However she continues to gain weight  She thinks that her blood sugars are probably higher in the last couple of days from going off her diet and eating more sweets and eating out  However fasting blood sugars appear to be fairly close to normal  She is not keeping an organized record of her blood sugars  However postprandial readings at night are usually above the target of 180  She is not able to do much walking for exercise because of foot pain and is not trying any other exercises        Side effects from medications have been: Abdominal distress from Metformin               Glucose monitoring:  done 0-1 times a day         Glucometer:  Generic    Blood Glucose readings by recall recently as below:   PRE-MEAL Fasting Lunch Dinner Bedtime Overall  Glucose range:  90-130       Mean/median:     ?   POST-MEAL PC Breakfast PC Lunch PC Dinner  Glucose range:    160-310  Mean/median:      Previously:  PRE-MEAL Fasting Lunch Dinner Bedtime Overall  Glucose range: 90-120    89-220  Mean/median:        POST-MEAL PC Breakfast PC Lunch PC Dinner  Glucose range:   180s  Mean/median:        Dietician visit, most recent: 06/14/2020  Weight history:  Wt Readings from Last 3 Encounters:  07/15/20 217 lb 3.2 oz (98.5 kg)  06/16/20 213 lb (96.6 kg)  05/14/20 210 lb 12.8 oz (95.6 kg)    Glycemic control:   Lab Results  Component Value Date   HGBA1C 6.1 (A) 07/15/2020   HGBA1C 9.5 (A) 02/19/2020   HGBA1C 7.8 (H) 07/24/2019   Lab Results  Component Value Date   MICROALBUR 0.9 07/24/2019   LDLCALC 163 (H) 02/24/2019   CREATININE 0.76 02/13/2020   Lab Results  Component Value Date   MICRALBCREAT 0.8 07/24/2019    No results found for: FRUCTOSAMINE  HYPOTHYROIDISM: See review of systems    Office Visit on 07/15/2020  Component Date Value Ref Range Status  .  Hemoglobin A1C 07/15/2020 6.1* 4.0 - 5.6 % Final    Allergies as of 07/15/2020      Reactions   Percocet [oxycodone-acetaminophen] Nausea And Vomiting      Medication List       Accurate as of July 15, 2020 10:39 AM. If you have any questions, ask your nurse or doctor.        Aimovig (140 MG Dose) 70 MG/ML Soaj Generic drug: Erenumab-aooe Inject 140 mg into the skin every 30 (thirty) days.   albuterol 108 (90 Base) MCG/ACT inhaler Commonly known as: VENTOLIN HFA Inhale 3 puffs into the lungs every 6 (six) hours as needed for wheezing or shortness of breath.   cetirizine 10 MG tablet Commonly known as: ZYRTEC Take 1 tablet (10 mg total) by mouth daily.   clotrimazole-betamethasone cream Commonly known as: Lotrisone Apply 1 application topically as needed. What changed: reasons to take this   diclofenac 50 MG tablet Commonly known as: CATAFLAM Take 1 tablet (50 mg  total) by mouth 2 (two) times daily as needed.   dicyclomine 20 MG tablet Commonly known as: BENTYL Take 1 tablet (20 mg total) by mouth 2 (two) times daily. What changed:   when to take this  reasons to take this   erythromycin ophthalmic ointment Place a 1/2 inch ribbon of ointment into the lower eyelid.   estradiol 1.53 MG/SPRAY transdermal spray Commonly known as: EVAMIST Place 3 sprays onto the skin daily.   ezetimibe 10 MG tablet Commonly known as: ZETIA Take 10 mg by mouth daily.   glipiZIDE 10 MG tablet Commonly known as: GLUCOTROL Take 10 mg by mouth daily.   lisinopril 5 MG tablet Commonly known as: ZESTRIL Take 1 tablet (5 mg total) by mouth daily.   metFORMIN 1000 MG tablet Commonly known as: GLUCOPHAGE Take 1,000 mg by mouth 2 (two) times daily.   metoprolol tartrate 50 MG tablet Commonly known as: LOPRESSOR Take 50 mg in the morning and 75 mg in the evening What changed: additional instructions   metroNIDAZOLE 1 % gel Commonly known as: METROGEL Apply 1 application topically at bedtime.   montelukast 10 MG tablet Commonly known as: SINGULAIR Take 10 mg by mouth every morning.   multivitamin with minerals tablet Take 1 tablet by mouth daily.   nystatin cream Commonly known as: MYCOSTATIN Apply 1 application topically daily as needed for dry skin.   omeprazole 40 MG capsule Commonly known as: PRILOSEC Take 40 mg by mouth daily. Taking 80 mg in there morning   ondansetron 4 MG disintegrating tablet Commonly known as: Zofran ODT Take 1 tablet (4 mg total) by mouth every 8 (eight) hours as needed for nausea or vomiting.   ondansetron 8 MG tablet Commonly known as: ZOFRAN Take 8 mg by mouth as needed for nausea or vomiting.   Ozempic (0.25 or 0.5 MG/DOSE) 2 MG/1.5ML Sopn Generic drug: Semaglutide(0.25 or 0.5MG /DOS) Inject 0.5 mg into the skin once a week.   PAZEO OP Apply to eye daily.   RED YEAST RICE PO Take 1 tablet by mouth  daily.   rizatriptan 10 MG tablet Commonly known as: Maxalt Take 1 tablet (10 mg total) by mouth 3 (three) times daily as needed for migraine.   Synthroid 125 MCG tablet Generic drug: levothyroxine Take two tablets (250mg  total) by mouth days 1-6 and skip day 7.   FlexTouch 100 UNIT/ML FlexTouch Pen Generic drug: insulin degludec Inject 20 units under the skin twice daily.   TRETINOIN  EX Apply topically daily.   triamcinolone cream 0.1 % Commonly known as: KENALOG Apply 1 application topically 2 (two) times daily as needed. What changed: reasons to take this   triamterene-hydrochlorothiazide 37.5-25 MG tablet Commonly known as: MAXZIDE-25 Take 1 tablet by mouth daily.   venlafaxine XR 37.5 MG 24 hr capsule Commonly known as: Effexor XR One tablet daily for one week, then take 2 a day   VITAMIN B-2 PO Take 1 tablet by mouth daily.   VITAMIN B12 PO Take by mouth every 30 (thirty) days.       Allergies:  Allergies  Allergen Reactions  . Percocet [Oxycodone-Acetaminophen] Nausea And Vomiting    Past Medical History:  Diagnosis Date  . Asthma   . Blood transfusion without reported diagnosis   . Chronic migraine   . Common migraine with intractable migraine 02/16/2019  . Diabetes mellitus without complication (HCC)   . Endometriosis   . Fatty liver   . GERD (gastroesophageal reflux disease)   . Hyperlipidemia   . Hypertension   . Hypothyroidism   . Incontinence   . Irregular heartbeat   . Multiple sclerosis (HCC)   . Palpitations   . Postconcussive syndrome 05/09/2020  . Pre-diabetes   . Sleep apnea     Past Surgical History:  Procedure Laterality Date  . ABDOMINAL HYSTERECTOMY    . CESAREAN SECTION     x1  . CHOLECYSTECTOMY    . ENDOMETRIAL ABLATION    . FOOT SURGERY Left 06/2018   foot reconstruction  . REPLACEMENT TOTAL KNEE Left     Family History  Problem Relation Age of Onset  . Heart attack Mother   . Stroke Mother   . Cancer  Mother        breast cancer  . Hyperlipidemia Mother   . Hypothyroidism Mother   . Cancer Father        lymph nodes, liver cancer  . Colon cancer Father        early 8s  . Other Daughter        Fine Gold Type II Syndrome and cognetive issues  . Memory loss Daughter   . Other Son        Fine Gold Syndrome and BPES  . Diabetes Son 22       IDDM  . Hypothyroidism Sister   . Cancer Sister   . Hyperthyroidism Neg Hx     Social History:  reports that she has never smoked. She has never used smokeless tobacco. She reports current alcohol use. She reports that she does not use drugs.   Review of Systems  HYPOTHYROIDISM: She initially had symptoms of fatigue, hair loss and difficulties with weight when she was a teenager She also has a strong family history of hypothyroidism She likely has been on thyroid supplements for about 25 years or more.  Fairly consistently  Although she has been on Synthroid mostly about 2 years ago an outside physician told her to take liothyronine also that she did not know why and she did not feel any better with starting this combination She previously was on levothyroxine 225 mcg and Cytomel 5 mcg for over 2 years  This was changed to levothyroxine 250 mcg on her visit in 11/20 Now taking this 6/7 days a week With this her TSH was last back to normal at 0.4  She is complaining of feeling excessively tired but is also having some difficulty sleeping  Lab Results  Component Value Date  TSH 0.40 03/18/2020   TSH 0.11 (L) 02/19/2020   TSH 0.81 07/24/2019   FREET4 1.04 07/24/2019   FREET4 0.73 02/24/2019   FREET4 0.62 (L) 11/25/2018    Lipid history: She was started on Lipitor 10 mg by her PCP but  it caused some upper body muscle aches She was also told previously to start on red rice yeast which she is taking    Lab Results  Component Value Date   CHOL 246 (H) 02/24/2019   HDL 45 02/24/2019   LDLCALC 163 (H) 02/24/2019   TRIG 188 (H)  02/24/2019   CHOLHDL 5.5 02/24/2019           Hypertension: Has been present since age 53 Treated with lisinopril, metoprolol and Maxzide and followed by local physician and cardiologist Blood pressure readings:  BP Readings from Last 3 Encounters:  07/15/20 128/86  05/24/20 (!) 147/88  05/14/20 130/80  .  Lab Results  Component Value Date   K 3.9 02/13/2020     Most recent foot exam: 11/20  Currently known complications of diabetes:?  Neuropathy  LABS:  Office Visit on 07/15/2020  Component Date Value Ref Range Status  . Hemoglobin A1C 07/15/2020 6.1* 4.0 - 5.6 % Final    Physical Examination:  BP 128/86   Pulse 80   Ht 5\' 4"  (1.626 m)   Wt 217 lb 3.2 oz (98.5 kg)   SpO2 98%   BMI 37.28 kg/m       ASSESSMENT:  Diabetes type 2 with obesity  See history of present illness for detailed discussion of current diabetes management, blood sugar patterns and problems identified  A1c is 6.1 compared to 9.5  Current treatment regimen is twice a day Tresiba insulin and now 0. 50 mg weekly Ozempic along with 1 g Metformin She is now able to take her medications including Ozempic and Tresiba consistently with patient assistance program  Fasting readings are fairly consistently near normal She has some variable readings after meals which are likely averaging just over 200, still using a generic monitor  Her A1c indicates overall better control but she thinks that her diet has been poor lately and this is causing her higher readings and likely weight gain  HYPOTHYROIDISM: She had been feeling well on 250 mcg levothyroxine 6 days a week but is now having some fatigue which may be nonspecific    PLAN:    Trial of 1 mg Ozempic weekly and she will double up on her supply  Try proceed she may try doing 10-11-1978 once a day and if morning sugars stay below 90 she can reduce the dose to 2 units progressively Discussed doing some upper body exercises with small weights as  well as exercise videos Recheck thyroid level  Follow-up in 3 months  Patient Instructions  Take 40 Tresiba daily  Take 2 shots 0.5 Ozempic       [place] 07/15/2020, 10:39 AM   Note: This office note was prepared with Dragon voice recognition system technology. Any transcriptional errors that result from this process are unintentional.

## 2020-07-15 NOTE — Patient Instructions (Addendum)
Take 40 Tresiba daily  Take 2 shots 0.5 Ozempic weekly  Check blood sugars on waking up days a week  Also check blood sugars about 2 hours after meals and do this after different meals by rotation  Recommended blood sugar levels on waking up are 90-130 and about 2 hours after meal is 130-160  Please bring your blood sugar monitor to each visit, thank you  More exercise

## 2020-07-16 NOTE — Progress Notes (Signed)
Please call to let patient know that the thyroid  results are normal and no further action needed

## 2020-07-24 ENCOUNTER — Other Ambulatory Visit: Payer: Self-pay

## 2020-07-24 ENCOUNTER — Telehealth: Payer: Self-pay

## 2020-07-24 ENCOUNTER — Other Ambulatory Visit (HOSPITAL_COMMUNITY)
Admission: RE | Admit: 2020-07-24 | Discharge: 2020-07-24 | Disposition: A | Discharge status: Home / Self Care | Payer: Self-pay | Source: Ambulatory Visit | Attending: Gastroenterology | Admitting: Gastroenterology

## 2020-07-24 DIAGNOSIS — R7989 Other specified abnormal findings of blood chemistry: Secondary | ICD-10-CM | POA: Insufficient documentation

## 2020-07-24 DIAGNOSIS — R300 Dysuria: Secondary | ICD-10-CM | POA: Insufficient documentation

## 2020-07-24 LAB — FERRITIN: Ferritin: 161 ng/mL (ref 11–307)

## 2020-07-24 LAB — LIPASE, BLOOD: Lipase: 29 U/L (ref 11–51)

## 2020-07-24 LAB — CBC WITH DIFFERENTIAL/PLATELET
Abs Immature Granulocytes: 0.02 10*3/uL (ref 0.00–0.07)
Basophils Absolute: 0.1 10*3/uL (ref 0.0–0.1)
Basophils Relative: 1 %
Eosinophils Absolute: 0.2 10*3/uL (ref 0.0–0.5)
Eosinophils Relative: 3 %
HCT: 43.3 % (ref 36.0–46.0)
Hemoglobin: 13.7 g/dL (ref 12.0–15.0)
Immature Granulocytes: 0 %
Lymphocytes Relative: 27 %
Lymphs Abs: 2 10*3/uL (ref 0.7–4.0)
MCH: 29.3 pg (ref 26.0–34.0)
MCHC: 31.6 g/dL (ref 30.0–36.0)
MCV: 92.7 fL (ref 80.0–100.0)
Monocytes Absolute: 0.4 10*3/uL (ref 0.1–1.0)
Monocytes Relative: 5 %
Neutro Abs: 4.9 10*3/uL (ref 1.7–7.7)
Neutrophils Relative %: 64 %
Platelets: 358 10*3/uL (ref 150–400)
RBC: 4.67 MIL/uL (ref 3.87–5.11)
RDW: 13.2 % (ref 11.5–15.5)
WBC: 7.7 10*3/uL (ref 4.0–10.5)
nRBC: 0 % (ref 0.0–0.2)

## 2020-07-24 LAB — URINALYSIS, ROUTINE W REFLEX MICROSCOPIC
Bilirubin Urine: NEGATIVE
Glucose, UA: NEGATIVE mg/dL
Ketones, ur: NEGATIVE mg/dL
Leukocytes,Ua: NEGATIVE
Nitrite: NEGATIVE
Protein, ur: NEGATIVE mg/dL
Specific Gravity, Urine: 1.024 (ref 1.005–1.030)
pH: 5 (ref 5.0–8.0)

## 2020-07-24 LAB — HEPATITIS B SURFACE ANTIGEN: Hepatitis B Surface Ag: NONREACTIVE

## 2020-07-24 LAB — IRON AND TIBC
Iron: 55 ug/dL (ref 28–170)
Saturation Ratios: 15 % (ref 10.4–31.8)
TIBC: 374 ug/dL (ref 250–450)
UIBC: 319 ug/dL

## 2020-07-24 LAB — COMPREHENSIVE METABOLIC PANEL
ALT: 24 U/L (ref 0–44)
AST: 21 U/L (ref 15–41)
Albumin: 4 g/dL (ref 3.5–5.0)
Alkaline Phosphatase: 107 U/L (ref 38–126)
Anion gap: 10 (ref 5–15)
BUN: 15 mg/dL (ref 6–20)
CO2: 25 mmol/L (ref 22–32)
Calcium: 8.9 mg/dL (ref 8.9–10.3)
Chloride: 105 mmol/L (ref 98–111)
Creatinine, Ser: 0.77 mg/dL (ref 0.44–1.00)
GFR, Estimated: >60 mL/min (ref >60)
Glucose, Bld: 102 mg/dL — ABNORMAL HIGH (ref 70–99)
Potassium: 4.3 mmol/L (ref 3.5–5.1)
Sodium: 140 mmol/L (ref 135–145)
Total Bilirubin: 0.5 mg/dL (ref 0.3–1.2)
Total Protein: 8.2 g/dL — ABNORMAL HIGH (ref 6.5–8.1)

## 2020-07-24 LAB — HEPATITIS C ANTIBODY: HCV Ab: NONREACTIVE

## 2020-07-24 LAB — HEPATITIS B CORE ANTIBODY, TOTAL: Hep B Core Total Ab: NONREACTIVE

## 2020-07-24 LAB — HEPATITIS B SURFACE ANTIBODY,QUALITATIVE: Hep B S Ab: NONREACTIVE

## 2020-07-24 NOTE — Telephone Encounter (Signed)
Pt called to get a copy of lab orders from her last ov 04/2020.  Lab orders were faxed to [facility] this morning. Pt has an apt tomorrow.

## 2020-07-24 NOTE — Progress Notes (Signed)
Referring Provider: Deeann Saint, MD Primary Care Physician:  Deeann Saint, MD Primary GI Physician: Dr. Jena Gauss  Chief Complaint  Patient presents with  . Abdominal Pain    daily to QOD, LLQ, RLQ.   Marland Kitchen Gastroesophageal Reflux    doing okay  . elevated LFTs  . Nausea    off/on, no vomiting    HPI:   Amy Lester is a 49 y.o. female presenting today for follow-up of elevated LFTs, GERD, and abdominal pain.  She was last seen in our office 04/19/2020 at the time of initial consult for the same.  Prior ultrasound in March 2021 with hepatomegaly and hepatic steatosis.  No immunity to hepatitis A.  At the time of her last visit, she reported 1+ year history of intermittent LUQ and intermittent RLQ abdominal pain with LUQ pain more frequent, describing it as a "tense pull, cramping" lasting about 10 minutes and self resolving.  No identified triggers.  No associated N/V. Bentyl was not providing any improvement. GERD well controlled on omeprazole 80 mg once every morning. BMs every 2 days.  Dysuria and urinary frequency. Prior CT in November 2020 with no acute findings.  Reported prior colonoscopy in [place] without polyps about 4 years ago.  Dad with colon cancer in his 62s.  Etiology of abdominal pain was not clear.  Less suspicious for gastritis, PUD, or pancreatitis.  She does have history of MS and wondered if pain was a manifestation of MS.  Possible UTI.  Plan to update labs, urine analysis, decrease omeprazole to 40 mg daily, stop Bentyl, limit diclofenac, add daily fiber supplement, request colonoscopy records, monitor for any worsening symptoms.  Consider EGD but await colonoscopy records as both procedures could be completed at the same time if needed.  In regards to elevated LFTs, she denied any significant alcohol use, no drug use, intermittent Tylenol use, and a few OTC supplements.  No signs or symptoms of advanced liver disease.  Plan to evaluate for hep A, B, C, iron  panel, counseled on fatty liver, advised to avoid OTC supplements.  Received EGD report from [facility] in [place] completed in March 2016 revealing irregular Z-line, erythematous mucosa in the gastric antrum and body of the stomach s/p biopsy, normal examined duodenum.  GE junction biopsy with mild chronic inflammation, gastric antrum and body biopsy with mild chronic inflammation, negative for H. Pylori. We tried to reach patient to determine where she had last colonoscopy, but we were never able to receive this information.  Labs completed 07/24/2020: CBC within normal limits, LFTs normalized, hep C antibody negative, hep B negative without immunity, iron panel within normal limits, lipase within normal limits, UA with rare bacteria.  Urine culture is in process.  Today:  GERD: Taking omeprazole 40 mg daily. Has mild symptoms daily. Notices it more after eating or laying down. Previously tried Protonix which did not work as well as omeprazole. Worse with tomato products. Has gained weight. States she is trying to eat healthier and isn't sure why she is gaining weight.  Seeing a dietitian.  Rare soda. Not active due to her foot, but this isn't a change.  No dysphagia.   Nausea has increased. Some days are worse than others. Some association with reflux or after eating, but feels it is worse when she is having pain in her lower abdomen. No vomiting. Some pain in her epigastric area about 1 hour after eating. Can last a couple of hours.  No NSAIDs.  Takes tylenol- 500 mg-1000 mg 3-4 days a week.   Abdominal pain: Continues with bilateral lower abdominal pain, worse in the LLQ.  Pain is present to a mild degree all day every day.  Pain can increase in severity without identified cause.  This is primarily in the left lower quadrant.  Currently 2/10 in severity, but can get up to 9/10. States, "It is like it nails you". Nto associated with bowel movements or meals. Doesn't know when it  will worsen.  Admits to dysuria for the last few days.  BMs every 2-3 days.  Tried Benefiber or Metamucil, but did not continue due to taste.  Does not want to try MiraLAX.  Stools are between soft-hard.  BMs are productive. No blood in the stool. No black stool.   Elevated LFTs: Alcohol once a year. Changed her diet.  Levemir was stopped and she started [place] Thyroid medication was adjusted.  Discontinued diclofenac and Bentyl.   She can't remember exactly when or where her last colonoscopy was completed.  Colonoscopy is definitely in [place], but cannot remember location. Thinks it has been more than 5 years ago.  Does not know if she had polyps.  Past Medical History:  Diagnosis Date  . Asthma   . Blood transfusion without reported diagnosis   . Chronic migraine   . Common migraine with intractable migraine 02/16/2019  . Diabetes mellitus without complication (HCC)   . Endometriosis   . Fatty liver   . GERD (gastroesophageal reflux disease)   . Hyperlipidemia   . Hypertension   . Hypothyroidism   . Incontinence   . Irregular heartbeat   . Multiple sclerosis (HCC)   . Palpitations   . Postconcussive syndrome 05/09/2020  . Pre-diabetes   . Sleep apnea     Past Surgical History:  Procedure Laterality Date  . ABDOMINAL HYSTERECTOMY    . CESAREAN SECTION     x1  . CHOLECYSTECTOMY    . ENDOMETRIAL ABLATION    . FOOT SURGERY Left 06/2018   foot reconstruction  . REPLACEMENT TOTAL KNEE Left     Current Outpatient Medications  Medication Sig Dispense Refill  . albuterol (VENTOLIN HFA) 108 (90 Base) MCG/ACT inhaler Inhale 3 puffs into the lungs every 6 (six) hours as needed for wheezing or shortness of breath. 3 g 1  . cetirizine (ZYRTEC) 10 MG tablet Take 1 tablet (10 mg total) by mouth daily. 90 tablet 0  . clotrimazole-betamethasone (LOTRISONE) cream Apply 1 application topically as needed. (Patient taking differently: Apply 1 application topically as needed (Outbreak). )  45 g 2  . Cyanocobalamin (VITAMIN B12 PO) Take by mouth every 30 (thirty) days. injection    . Erenumab-aooe (AIMOVIG, 140 MG DOSE,) 70 MG/ML SOAJ Inject 140 mg into the skin every 30 (thirty) days. 1.12 mL 5  . estradiol (EVAMIST) 1.53 MG/SPRAY transdermal spray Place 3 sprays onto the skin daily.    Marland Kitchen ezetimibe (ZETIA) 10 MG tablet Take 10 mg by mouth daily.     Marland Kitchen glipiZIDE (GLUCOTROL) 10 MG tablet Take 10 mg by mouth daily.     . insulin degludec (TRESIBA FLEXTOUCH) 100 UNIT/ML FlexTouch Pen Inject 20 units under the skin twice daily. 15 mL 0  . lisinopril (ZESTRIL) 5 MG tablet Take 1 tablet (5 mg total) by mouth daily. 90 tablet 1  . MAGNESIUM OXIDE PO Take by mouth.    . metFORMIN (GLUCOPHAGE) 1000 MG tablet Take 1,000 mg by mouth 2 (two)  times daily.    . metoprolol tartrate (LOPRESSOR) 50 MG tablet Take 50 mg in the morning and 75 mg in the evening (Patient taking differently: 25mg  in the morning and 50mg  in evening) 225 tablet 1  . metroNIDAZOLE (METROGEL) 1 % gel Apply 1 application topically at bedtime.     . montelukast (SINGULAIR) 10 MG tablet Take 10 mg by mouth every morning.    . Multiple Vitamins-Minerals (MULTIVITAMIN WITH MINERALS) tablet Take 1 tablet by mouth daily.    Marland Kitchen nystatin cream (MYCOSTATIN) Apply 1 application topically daily as needed for dry skin.    Marland Kitchen Olopatadine HCl (PAZEO OP) Apply to eye daily.    . ondansetron (ZOFRAN ODT) 4 MG disintegrating tablet Take 1 tablet (4 mg total) by mouth every 8 (eight) hours as needed for nausea or vomiting. 20 tablet 0  . ondansetron (ZOFRAN) 8 MG tablet Take 1 tablet (8 mg total) by mouth every 8 (eight) hours as needed for nausea or vomiting. 20 tablet 1  . Red Yeast Rice Extract (RED YEAST RICE PO) Take 1 tablet by mouth daily.     . Riboflavin (VITAMIN B-2 PO) Take 1 tablet by mouth daily.     . rizatriptan (MAXALT) 10 MG tablet Take 1 tablet (10 mg total) by mouth 3 (three) times daily as needed for migraine. 10 tablet 5  .  Semaglutide,0.25 or 0.5MG /DOS, (OZEMPIC, 0.25 OR 0.5 MG/DOSE,) 2 MG/1.5ML SOPN Inject 1 mg into the skin once a week.     Marland Kitchen SYNTHROID 125 MCG tablet Take two tablets (250mg  total) by mouth days 1-6 and skip day 7. (Patient taking differently: 225mg  by mouth days 1-6 and skip day 7.) 180 tablet 1  . TRETINOIN EX Apply topically daily.    Marland Kitchen triamcinolone cream (KENALOG) 0.1 % Apply 1 application topically 2 (two) times daily as needed. (Patient taking differently: Apply 1 application topically 2 (two) times daily as needed (rash). ) 45 g 0  . triamterene-hydrochlorothiazide (MAXZIDE-25) 37.5-25 MG tablet Take 1 tablet by mouth daily. 90 tablet 1  . venlafaxine XR (EFFEXOR XR) 37.5 MG 24 hr capsule One tablet daily for one week, then take 2 a day 60 capsule 3  . omeprazole (PRILOSEC) 40 MG capsule Take 1 capsule (40 mg total) by mouth 2 (two) times daily before a meal. 60 capsule 3   No current facility-administered medications for this visit.    Allergies as of 07/25/2020 - Review Complete 07/25/2020  Allergen Reaction Noted  . Percocet [oxycodone-acetaminophen] Nausea And Vomiting 11/24/2018    Family History  Problem Relation Age of Onset  . Heart attack Mother   . Stroke Mother   . Cancer Mother        breast cancer  . Hyperlipidemia Mother   . Hypothyroidism Mother   . Cancer Father        lymph nodes, liver cancer  . Colon cancer Father        early 78s  . Other Daughter        Fine Gold Type II Syndrome and cognetive issues  . Memory loss Daughter   . Other Son        Fine Gold Syndrome and BPES  . Diabetes Son 22       IDDM  . Hypothyroidism Sister   . Cancer Sister   . Hyperthyroidism Neg Hx     Social History   Socioeconomic History  . Marital status: Married    Spouse name: Not on file  .  Number of children: Not on file  . Years of education: 66  . Highest education level: Not on file  Occupational History  . Not on file  Tobacco Use  . Smoking status: Never  Smoker  . Smokeless tobacco: Never Used  Vaping Use  . Vaping Use: Never used  Substance and Sexual Activity  . Alcohol use: Yes    Comment: once a year  . Drug use: Never  . Sexual activity: Not on file  Other Topics Concern  . Not on file  Social History Narrative   Left handed    Caffeine  1 soda per day    Lives at home with husband and adult daughter   Social Determinants of Health   Financial Resource Strain:   . Difficulty of Paying Living Expenses: Not on file  Food Insecurity:   . Worried About Programme researcher, broadcasting/film/video in the Last Year: Not on file  . Ran Out of Food in the Last Year: Not on file  Transportation Needs:   . Lack of Transportation (Medical): Not on file  . Lack of Transportation (Non-Medical): Not on file  Physical Activity:   . Days of Exercise per Week: Not on file  . Minutes of Exercise per Session: Not on file  Stress:   . Feeling of Stress : Not on file  Social Connections:   . Frequency of Communication with Friends and Family: Not on file  . Frequency of Social Gatherings with Friends and Family: Not on file  . Attends Religious Services: Not on file  . Active Member of Clubs or Organizations: Not on file  . Attends Banker Meetings: Not on file  . Marital Status: Not on file    Review of Systems: Gen: Denies fever, chills, cold or flulike symptoms, lightheadedness, dizziness, presyncope, syncope. CV: Denies chest pain or palpitations. Resp: Denies dyspnea or cough. GI: See HPI Heme: See HPI  Physical Exam: BP 136/87   Pulse 75   Temp (!) 97.1 F (36.2 C)   Ht 5\' 4"  (1.626 m)   Wt 221 lb 6.4 oz (100.4 kg)   BMI 38.00 kg/m  General:   Alert and oriented. No distress noted. Pleasant and cooperative.  Head:  Normocephalic and atraumatic. Eyes:  Conjuctiva clear without scleral icterus. Heart:  S1, S2 present without murmurs appreciated. Lungs:  Clear to auscultation bilaterally. No wheezes, rales, or rhonchi. No distress.   Abdomen:  +BS, soft, and non-distended.  Mild TTP in epigastric area and moderate TTP in LLQ.  No rebound or guarding. No HSM or masses noted. Msk:  Symmetrical without gross deformities. Normal posture. Extremities:  Without edema. Neurologic:  Alert and  oriented x4 Psych:  Normal mood and affect.

## 2020-07-25 ENCOUNTER — Encounter: Payer: Self-pay | Admitting: Gastroenterology

## 2020-07-25 ENCOUNTER — Ambulatory Visit (INDEPENDENT_AMBULATORY_CARE_PROVIDER_SITE_OTHER): Payer: Self-pay | Admitting: Gastroenterology

## 2020-07-25 ENCOUNTER — Other Ambulatory Visit: Payer: Self-pay

## 2020-07-25 ENCOUNTER — Ambulatory Visit (HOSPITAL_COMMUNITY): Payer: Self-pay

## 2020-07-25 ENCOUNTER — Encounter: Payer: Self-pay | Admitting: *Deleted

## 2020-07-25 VITALS — BP 136/87 | HR 75 | Temp 97.1°F | Ht 64.0 in | Wt 221.4 lb

## 2020-07-25 DIAGNOSIS — R300 Dysuria: Secondary | ICD-10-CM

## 2020-07-25 DIAGNOSIS — K219 Gastro-esophageal reflux disease without esophagitis: Secondary | ICD-10-CM

## 2020-07-25 DIAGNOSIS — R7989 Other specified abnormal findings of blood chemistry: Secondary | ICD-10-CM

## 2020-07-25 DIAGNOSIS — R109 Unspecified abdominal pain: Secondary | ICD-10-CM

## 2020-07-25 DIAGNOSIS — R110 Nausea: Secondary | ICD-10-CM

## 2020-07-25 LAB — URINE CULTURE: Culture: <10000 — AB

## 2020-07-25 MED ORDER — ONDANSETRON HCL 8 MG PO TABS: 8.0000 mg | ORAL_TABLET | Freq: Three times a day (TID) | ORAL | 1 refills | Status: DC | PRN

## 2020-07-25 MED ORDER — OMEPRAZOLE 40 MG PO CPDR: 40.0000 mg | DELAYED_RELEASE_CAPSULE | Freq: Two times a day (BID) | ORAL | 3 refills | Status: DC

## 2020-07-25 NOTE — Assessment & Plan Note (Signed)
Addressed under abdominal pain.  

## 2020-07-25 NOTE — Assessment & Plan Note (Addendum)
History of elevated LFTs.  Slight elevation of ALT and alk phos in November 2020.  Labs in June 2021 with AST 70 (H), ALT 97 (H), alk phos 111, total bilirubin 0.2, platelets 304.  Ultrasound March 2021 with hepatomegaly and hepatic steatosis, CBD normal, spleen normal.  No significant alcohol use.  No history of drug use.  Tylenol 500 mg - 1000 mg a few times a week.  OTC supplements include red yeast rice, B1 and B2 vitamins, vitamin C.  No signs or symptoms of decompensated liver disease.  Most recent labs 03/23/2020 with LFTs normalized, hep C antibody negative, hep B negative without immunity, iron panel within normal limits.  Patient has made dietary adjustments and is trying to eat healthier.  She is following with the dietitian.  Unfortunately, she has gained 11 pounds in the last 3 months.  She has also had some medication changes including discontinuing Levemir and starting Tresiba, discontinuing diclofenac and Bentyl, and thyroid medication has been adjusted.  Query whether diclofenac may have been influencing LFT elevation.  Plan: Continue to avoid diclofenac. Recommend 1-2# weight loss per week until ideal body weight through exercise & diet. Low fat/cholesterol diet.   Avoid sweets, sodas, fruit juices, sweetened beverages like tea, etc. Gradually increase exercise from 15 min daily up to 1 hr per day 5 days/week. Continue to limit alcohol use. Limit Tylenol. No more than 2000 mg/day. We will plan to recheck LFTs at follow-up appointment.  Date to be determined.  Likely after EGD and colonoscopy.

## 2020-07-25 NOTE — Assessment & Plan Note (Addendum)
49 year old female with greater than 1 year history of lower abdominal pain without identified trigger.  Over the last 3 months, pain has become constant at a mild degree with intermittent episodes of severe left lower quadrant pain with associated nausea but no vomiting.  Denies BRBPR or melena.  No prior history of diverticulitis.  BMs every 2-3 days.  Prior trial of Bentyl by PCP was not helpful.  She does note dysuria.  Labs completed yesterday (ordered at last office visit) with no evidence of leukocytosis.  UA with rare bacteria, urine culture is in process.  Abdominal exam with moderate tenderness palpation in the left lower quadrant.  Notably, patient's father had colon cancer in his 11s.  Patients last colonoscopy was in [place], but cannot remember exactly where.  Believes colonoscopy may have been more than 5 years ago.  Unknown if any polyps.  Differentials for left lower quadrant abdominal pain include diverticulitis, UTI, GYN etiology, adhesions, and possibly influenced by mild constipation.    Plan to proceed with CT A/P ASAP.  I have also advised that she start Colace daily to see if moving her bowels on a daily basis will provide any improvement.  We will also plan to update her colonoscopy following CT A/P as long as there are no acute findings.  She may continue using Zofran for now.  Refill was sent to pharmacy.

## 2020-07-25 NOTE — Assessment & Plan Note (Addendum)
Chronic.  Not adequately controlled on omeprazole 40 mg daily.  Denies dysphagia.  Continues to have epigastric abdominal pain worsened with meals which has been present for several months at this point. Also with mild nausea without vomiting.  Denies BRBPR, melena, or NSAIDs.  Had previously been taking diclofenac, but discontinued this after her last visit with me in August 2021.  Abdominal exam with mild tenderness palpation in the epigastric area. Epigastric pain and nausea may be secondary to uncontrolled GERD, gastritis, esophagitis, or duodenitis.  Cannot rule out H. pylori or PUD.  Plan: Increase omeprazole to 40 mg twice daily 30 minutes before breakfast and dinner. Counseled on GERD diet/lifestyle. Continue Zofran as needed. Hope to proceed with EGD with Dr. Jena Gauss in the near future.  This will be scheduled after CT A/P which is being pursued for lower abdominal pain discussed below. The risks, benefits, and alternatives have been discussed with the patient in detail. The patient states understanding and desires to proceed.  ASA II Likely follow-up after procedures.

## 2020-07-25 NOTE — Patient Instructions (Addendum)
Please have CT of your abdomen and pelvis completed at [facility].   Increase omeprazole to 40 mg twice daily 30 minutes before breakfast and dinner.  Follow a GERD diet:  Avoid fried, fatty, greasy, spicy, citrus foods. Avoid caffeine and carbonated beverages. Avoid chocolate. Try eating 4-6 small meals a day rather than 3 large meals. Do not eat within 3 hours of laying down. Prop head of bed up on wood or bricks to create a 6 inch incline.  You may continue to take Zofran every 8 hours as needed for nausea.  Please also start Colace daily to help with bowel movements. Please let me know if you end up with diarrhea.  For elevated liver function test: Your liver function tests have returned to normal. Recommend 1-2# weight loss per week until ideal body weight through exercise & diet. Low fat/cholesterol diet.   Avoid sweets, sodas, fruit juices, sweetened beverages like tea, etc. Gradually increase exercise from 15 min daily up to 1 hr per day 5 days/week. Continue to limit alcohol use. Limit Tylenol. No more than 2000 mg/day.  We will call you with CT results and further recommendations.  [name], PA-C [facility]

## 2020-07-25 NOTE — Assessment & Plan Note (Signed)
Urine analysis completed yesterday with rare bacteria.  Urine culture is pending.  We will follow up on this.

## 2020-07-26 ENCOUNTER — Ambulatory Visit (HOSPITAL_COMMUNITY)
Admission: RE | Admit: 2020-07-26 | Discharge: 2020-07-26 | Disposition: A | Discharge status: Home / Self Care | Payer: Self-pay | Source: Ambulatory Visit | Attending: Gastroenterology | Admitting: Gastroenterology

## 2020-07-26 DIAGNOSIS — R109 Unspecified abdominal pain: Secondary | ICD-10-CM | POA: Insufficient documentation

## 2020-07-26 IMAGING — CT CT ABD-PELV W/ CM
2 of 5 series · 17 of 46 positions shown, 19 images · IV contrast (omnipaque)
Comparison: [DATE]

CLINICAL DATA: Left lower quadrant pain

EXAM:
CT ABDOMEN AND PELVIS WITH CONTRAST
TECHNIQUE: Multidetector CT imaging of the abdomen and pelvis was performed
using the standard protocol following bolus administration of
intravenous contrast.
CONTRAST:  100mL OMNIPAQUE IOHEXOL 300 MG/ML  SOLN

[Series 2: axial st · axial · 0.89mm/px · z∈[+845,+1270]mm · 14 of 97 slices shown, 16 images]
[im 6/97  soft-tissue]
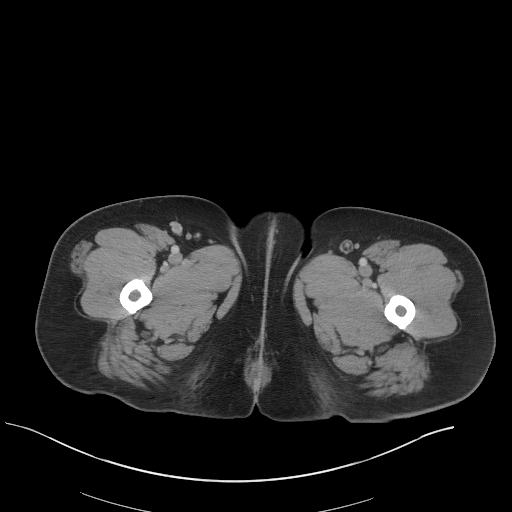
[im 6/97  bone]
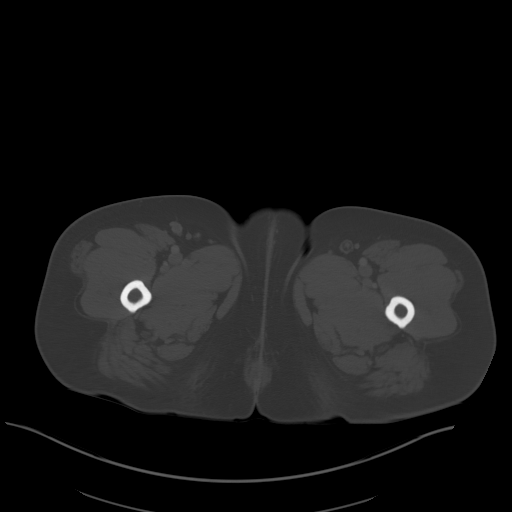
[im 12/97  soft-tissue]
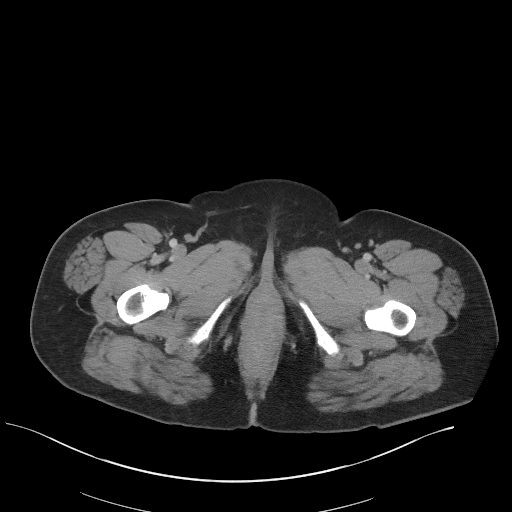
[im 17/97  soft-tissue]
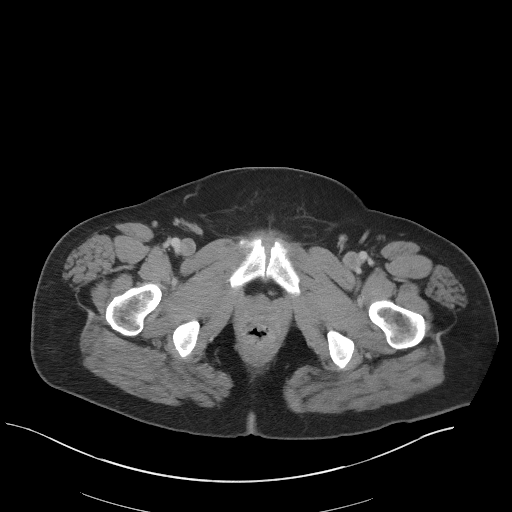
[im 29/97  soft-tissue]
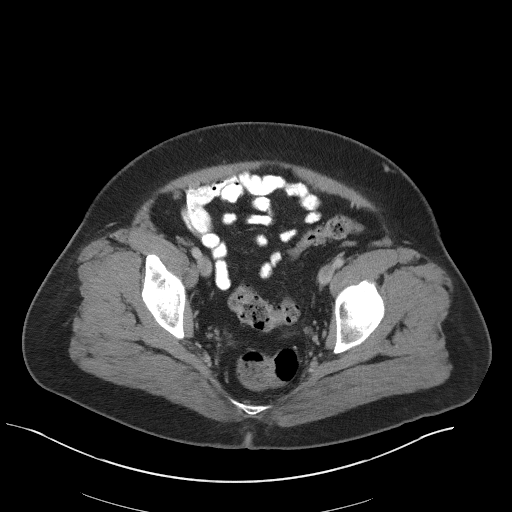
[im 34/97  soft-tissue]
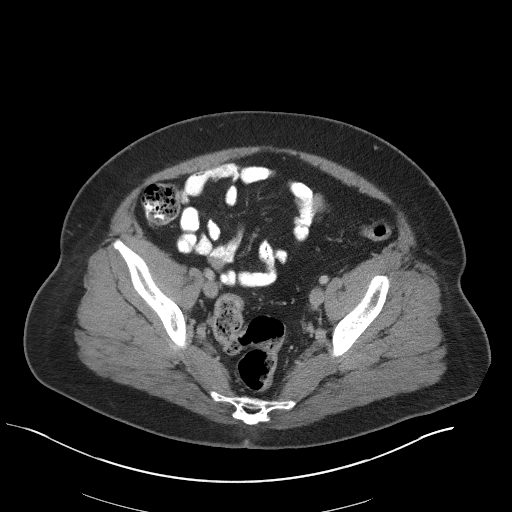
[im 40/97  soft-tissue]
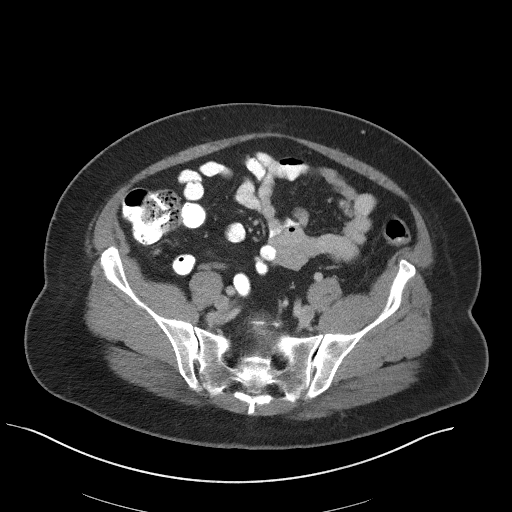
[im 46/97  soft-tissue]
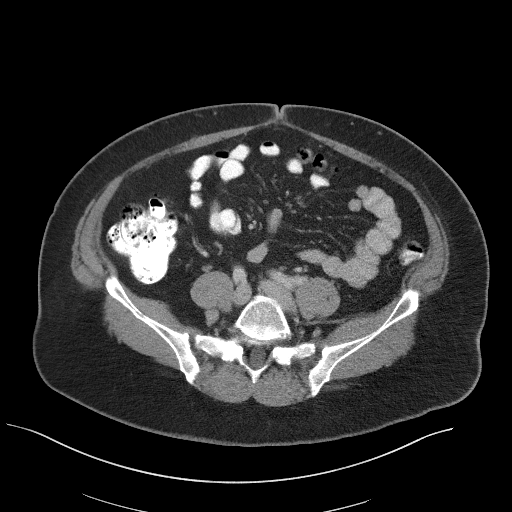
[im 51/97  soft-tissue]
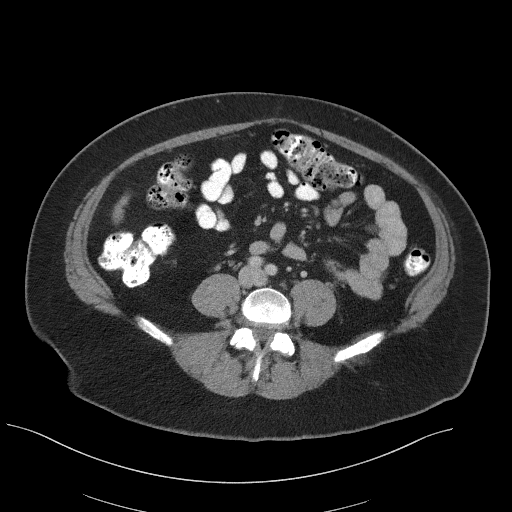
[im 57/97  soft-tissue]
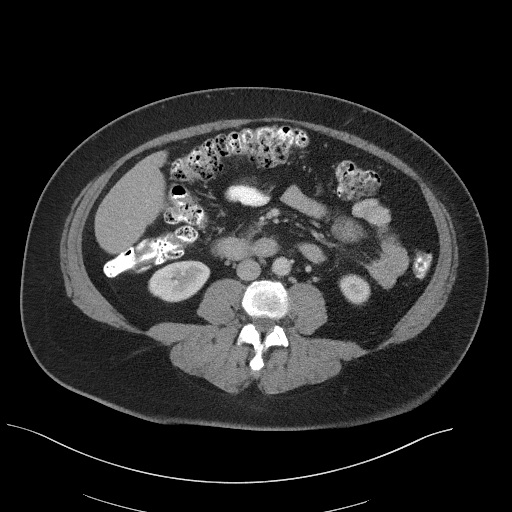
[im 57/97  bone]
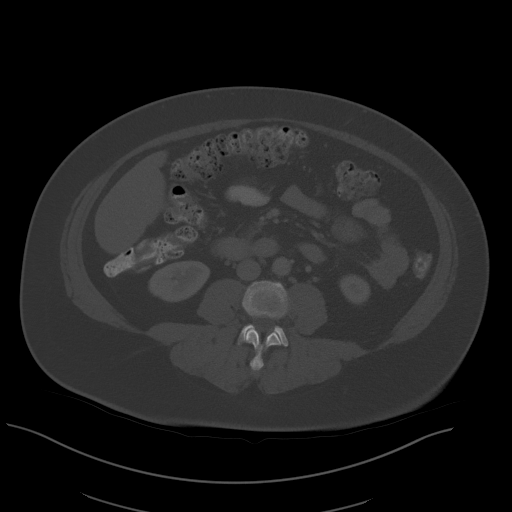
[im 63/97  soft-tissue]
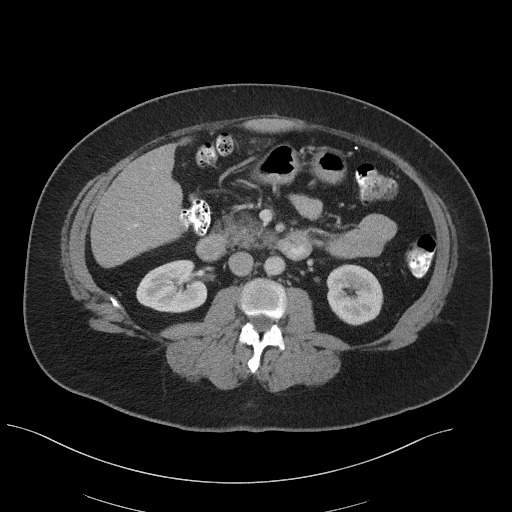
[im 74/97  soft-tissue]
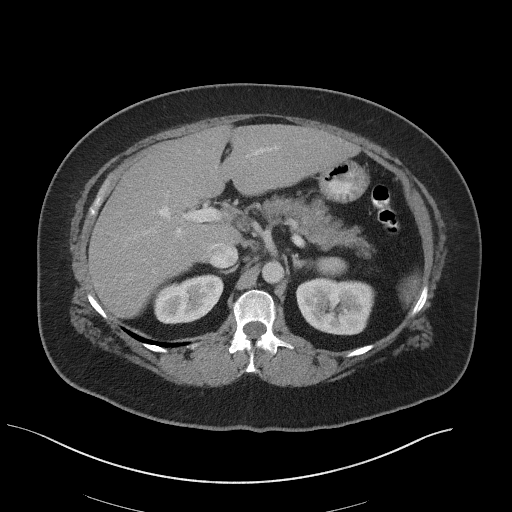
[im 80/97  soft-tissue]
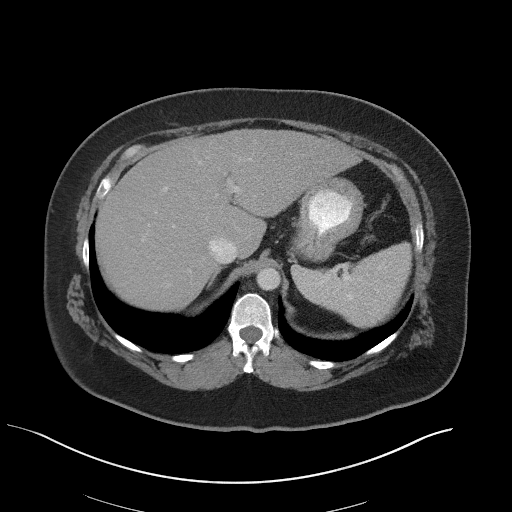
[im 85/97  soft-tissue]
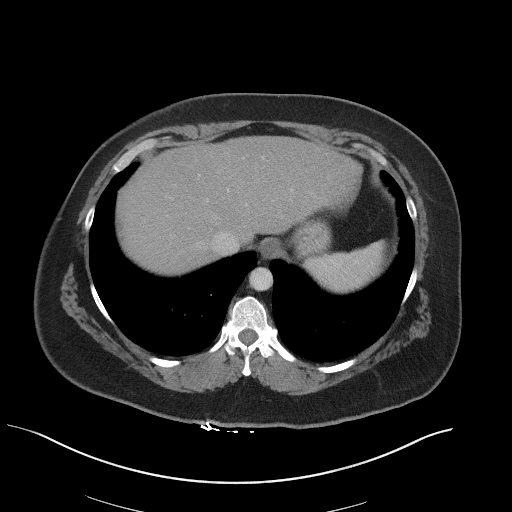
[im 91/97  soft-tissue]
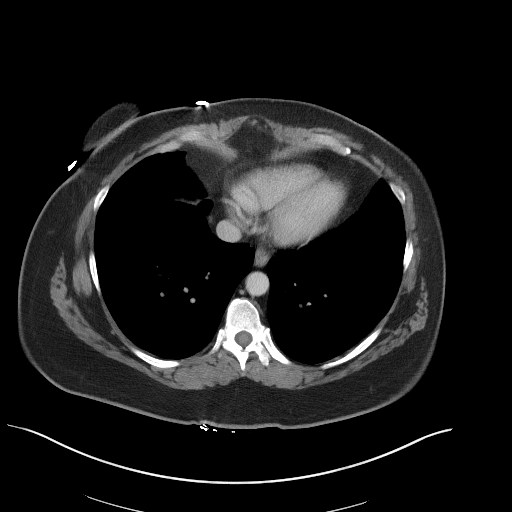

[Series 5: coronal st · coronal · 0.87mm/px · 3 of 115 slices shown]
[im 39/115  soft-tissue]
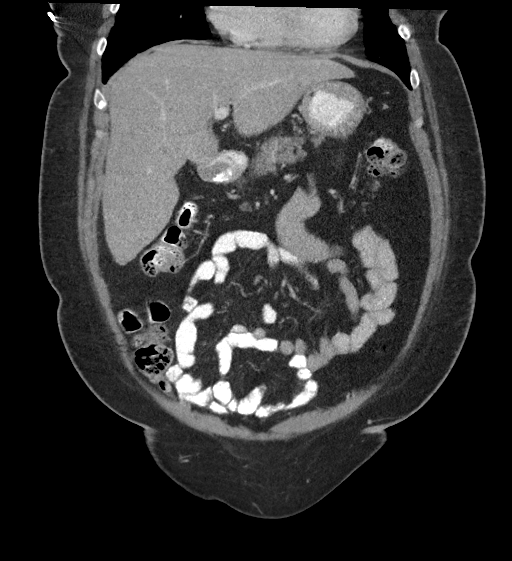
[im 51/115  soft-tissue]
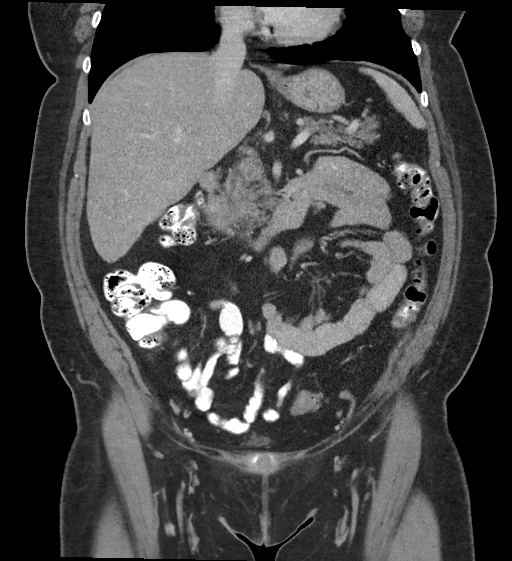
[im 64/115  soft-tissue]
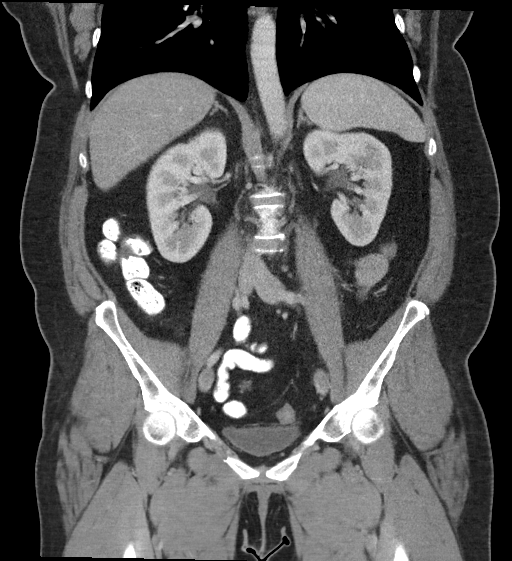

[17 of 46 positions shown; findings below may reference images not displayed]

FINDINGS: Lower chest: Lung bases are clear. No effusions. Heart is normal
size.

Hepatobiliary: No focal liver abnormality is seen. Status post
cholecystectomy. No biliary dilatation.

Pancreas: No focal abnormality or ductal dilatation.

Spleen: No focal abnormality.  Normal size.

Adrenals/Urinary Tract: No adrenal abnormality. No focal renal
abnormality. No stones or hydronephrosis. Urinary bladder is
unremarkable.

Stomach/Bowel: Stomach, large and small bowel grossly unremarkable.

Vascular/Lymphatic: No evidence of aneurysm or adenopathy.

Reproductive: Prior hysterectomy.  No adnexal masses.

Other: No free fluid or free air.

Musculoskeletal: No acute bony abnormality.
IMPRESSION: No acute findings in the abdomen or pelvis.

## 2020-07-26 MED ORDER — IOHEXOL 300 MG/ML  SOLN
100.0000 mL | Freq: Once | INTRAMUSCULAR | Status: AC | PRN
  Administered 2020-07-26: 100 mL via INTRAVENOUS

## 2020-07-28 NOTE — Progress Notes (Signed)
No acute findings on CT to explain abdominal pain. Recommend we proceed with EGD and colonoscopy as we discussed at her OV.   [facility] Clinical Pool: Please arrange EGD + TCS with propofol with Dr. Jena Gauss.  ASA III Dx: colon cancer screening, family history of colon cancer, lower abdominal pain, epigastric abdominal pain, nausea without vomiting.   Medication adjustments 1 day prior to procedure: One half dose of glipizide (5 mg), one half dose of Tresiba (10 units twice daily), one half dose of metformin (500 mg twice daily). *Please advise patient to keep a close check on her blood sugars while she is on clear liquids and correct any low blood sugars with approved sugary clear liquids.   Day of procedure: Do not take any diabetes medications the morning of procedure.

## 2020-07-29 NOTE — Progress Notes (Signed)
Cardiology Office Note  Date: 07/30/2020   ID: Amy Lester, DOB May 15, 1971, MRN [id number]  PCP:  Deeann Saint, MD  Cardiologist:  Dina Rich, MD Electrophysiologist:  None   Chief Complaint: Follow-up palpitations, HTN, HLD  History of Present Illness: Amy Lester is a 49 y.o. female with a history of palpitations, HTN, HLD, DM2, NAFLD, GERD, hypothyroidism, MS, OSA.  Last encounter with Dr. Wyline Mood 04/24/2020.  History of palpitations and seen in the ER in the past with episode of palpitations occurring while laying in bed.  Recent increase in symptoms with heavy heartbeats.  Symptoms lasting about 10 minutes.  Feeling weak all over, occurring few times a week.  Had a previous cardiac monitor 3 years prior.  Event monitor July 2020 no arrhythmias.  Had previously changed to Toprol which gave her fatigue.  She went back on Metoprolol 25 mg a.m., 50 mg p.m.  Still having symptoms at times.  Lasting a few minutes.  She was compliant with her statin medication.  Lipids on 02/2019 : TC 246, HDL 45, TG 28, LDL 163.  She had elevated LFTs and was seeing GI.  Later she was off statin.  Had some lower extremity edema, primarily left side.  Echo 05/28/2019 showed EF greater than 65%, indeterminate diastolic function.  He was compliant with antihypertensive medication.    Patient is here at the suggestion of an outpatient clinic due to elevated LDL on recent labs.  Patient states her LDL was 199.  Previously on statin medications but had elevated LFTs.  Statin was stopped and she was on Zetia only.  She does have fatty liver disease and sees gastroenterology.  She has been intolerant to statins due to statin associated muscle symptoms.  She states she continues to have some palpitations which are more noticeable at night when she is in bed which are short-lived and she has no other symptoms.  States she has what she describes as weird symptoms in her arms she can feel like her heart is  beating in her arms occasionally.  She denies any anginal symptoms or exertional symptoms.  She is limited to exercise due to reconstruction  of her ankle/foot.  She states her diabetes is much better controlled recently with hemoglobin A1c of 6.1% 2 weeks ago.  LFTs on 07/24/2020: AST 21, ALT 24.    Past Medical History:  Diagnosis Date  . Asthma   . Blood transfusion without reported diagnosis   . Chronic migraine   . Common migraine with intractable migraine 02/16/2019  . Diabetes mellitus without complication (HCC)   . Endometriosis   . Fatty liver   . GERD (gastroesophageal reflux disease)   . Hyperlipidemia   . Hypertension   . Hypothyroidism   . Incontinence   . Irregular heartbeat   . Multiple sclerosis (HCC)   . Palpitations   . Postconcussive syndrome 05/09/2020  . Pre-diabetes   . Sleep apnea     Past Surgical History:  Procedure Laterality Date  . ABDOMINAL HYSTERECTOMY    . CESAREAN SECTION     x1  . CHOLECYSTECTOMY    . ENDOMETRIAL ABLATION    . FOOT SURGERY Left 06/2018   foot reconstruction  . REPLACEMENT TOTAL KNEE Left     Current Outpatient Medications  Medication Sig Dispense Refill  . albuterol (VENTOLIN HFA) 108 (90 Base) MCG/ACT inhaler Inhale 3 puffs into the lungs every 6 (six) hours as needed for wheezing or shortness of breath. 3  g 1  . cetirizine (ZYRTEC) 10 MG tablet Take 1 tablet (10 mg total) by mouth daily. 90 tablet 0  . clotrimazole-betamethasone (LOTRISONE) cream Apply 1 application topically as needed. (Patient taking differently: Apply 1 application topically as needed (Outbreak). ) 45 g 2  . Cyanocobalamin (VITAMIN B12 PO) Take by mouth every 30 (thirty) days. injection    . Erenumab-aooe (AIMOVIG, 140 MG DOSE,) 70 MG/ML SOAJ Inject 140 mg into the skin every 30 (thirty) days. 1.12 mL 5  . estradiol (EVAMIST) 1.53 MG/SPRAY transdermal spray Place 3 sprays onto the skin daily.    Marland Kitchen ezetimibe (ZETIA) 10 MG tablet Take 10 mg by mouth  daily.     Marland Kitchen glipiZIDE (GLUCOTROL) 10 MG tablet Take 10 mg by mouth daily.     . insulin degludec (TRESIBA FLEXTOUCH) 100 UNIT/ML FlexTouch Pen Inject 20 units under the skin twice daily. 15 mL 0  . lisinopril (ZESTRIL) 5 MG tablet Take 1 tablet (5 mg total) by mouth daily. 90 tablet 1  . MAGNESIUM OXIDE PO Take by mouth.    . metFORMIN (GLUCOPHAGE) 1000 MG tablet Take 1,000 mg by mouth 2 (two) times daily.    . metoprolol tartrate (LOPRESSOR) 25 MG tablet Take 25 mg by mouth in the morning.    . metoprolol tartrate (LOPRESSOR) 50 MG tablet Take 50 mg by mouth at bedtime.    . metroNIDAZOLE (METROGEL) 1 % gel Apply 1 application topically at bedtime.     . montelukast (SINGULAIR) 10 MG tablet Take 10 mg by mouth every morning.    . Multiple Vitamins-Minerals (MULTIVITAMIN WITH MINERALS) tablet Take 1 tablet by mouth daily.    Marland Kitchen nystatin cream (MYCOSTATIN) Apply 1 application topically daily as needed for dry skin.    Marland Kitchen Olopatadine HCl (PAZEO OP) Apply to eye daily.    Marland Kitchen omeprazole (PRILOSEC) 40 MG capsule Take 1 capsule (40 mg total) by mouth 2 (two) times daily before a meal. 60 capsule 3  . ondansetron (ZOFRAN ODT) 4 MG disintegrating tablet Take 1 tablet (4 mg total) by mouth every 8 (eight) hours as needed for nausea or vomiting. 20 tablet 0  . ondansetron (ZOFRAN) 8 MG tablet Take 1 tablet (8 mg total) by mouth every 8 (eight) hours as needed for nausea or vomiting. 20 tablet 1  . Red Yeast Rice Extract (RED YEAST RICE PO) Take 1 tablet by mouth daily.     . Riboflavin (VITAMIN B-2 PO) Take 1 tablet by mouth daily.     . rizatriptan (MAXALT) 10 MG tablet Take 1 tablet (10 mg total) by mouth 3 (three) times daily as needed for migraine. 10 tablet 5  . Semaglutide,0.25 or 0.5MG /DOS, (OZEMPIC, 0.25 OR 0.5 MG/DOSE,) 2 MG/1.5ML SOPN Inject 1 mg into the skin once a week.     Marland Kitchen SYNTHROID 125 MCG tablet Take two tablets (250mg  total) by mouth days 1-6 and skip day 7. (Patient taking differently:  225mg  by mouth days 1-6 and skip day 7.) 180 tablet 1  . TRETINOIN EX Apply topically daily.    triamcinolone cream (KENALOG) 0.1 % Apply 1 application topically 2 (two) times daily as needed. (Patient taking differently: Apply 1 application topically 2 (two) times daily as needed (rash). ) 45 g 0  . triamterene-hydrochlorothiazide (MAXZIDE-25) 37.5-25 MG tablet Take 1 tablet by mouth daily. 90 tablet 1  . venlafaxine XR (EFFEXOR XR) 37.5 MG 24 hr capsule One tablet daily for one week, then take 2 a day  60 capsule 3   No current facility-administered medications for this visit.   Allergies:  Percocet [oxycodone-acetaminophen]   Social History: The patient  reports that she has never smoked. She has never used smokeless tobacco. She reports current alcohol use. She reports that she does not use drugs.   Family History: The patient's family history includes Cancer in her father, mother, and sister; Colon cancer in her father; Diabetes (age of onset: 6) in her son; Heart attack in her mother; Hyperlipidemia in her mother; Hypothyroidism in her mother and sister; Memory loss in her daughter; Other in her daughter and son; Stroke in her mother.   ROS:  Please see the history of present illness. Otherwise, complete review of systems is positive for none.  All other systems are reviewed and negative.   Physical Exam: VS:  BP 126/82   Pulse 73   Ht 5\' 4"  (1.626 m)   Wt 219 lb 3.2 oz (99.4 kg)   SpO2 96%   BMI 37.63 kg/m , BMI Body mass index is 37.63 kg/m.  Wt Readings from Last 3 Encounters:  07/30/20 219 lb 3.2 oz (99.4 kg)  07/25/20 221 lb 6.4 oz (100.4 kg)  07/15/20 217 lb 3.2 oz (98.5 kg)    General: Obese patient appears comfortable at rest. Neck: Supple, no elevated JVP or carotid bruits, no thyromegaly. Lungs: Clear to auscultation, nonlabored breathing at rest. Cardiac: Regular rate and rhythm, no S3 or significant systolic murmur, no pericardial rub. Extremities: No pitting  edema, distal pulses 2+. Skin: Warm and dry. Musculoskeletal: No kyphosis. Neuropsychiatric: Alert and oriented x3, affect grossly appropriate.  ECG:    Recent Labwork: 07/15/2020: TSH 1.89 07/24/2020: ALT 24; AST 21; BUN 15; Creatinine, Ser 0.77; Hemoglobin 13.7; Platelets 358; Potassium 4.3; Sodium 140     Component Value Date/Time   CHOL 246 (H) 02/24/2019 1455   TRIG 188 (H) 02/24/2019 1455   HDL 45 02/24/2019 1455   CHOLHDL 5.5 02/24/2019 1455   VLDL 38 02/24/2019 1455   LDLCALC 163 (H) 02/24/2019 1455    Other Studies Reviewed Today:  05/2019 echo IMPRESSIONS   1. The left ventricle has hyperdynamic systolic function, with an ejection fraction of >65%. The cavity size was normal. Left ventricular diastolic parameters were normal. No evidence of left ventricular regional wall motion abnormalities. 2. The right ventricle has normal systolic function. The cavity was normal. There is no increase in right ventricular wall thickness. 3. The aortic valve is tricuspid. Mild aortic annular calcification noted. 4. The mitral valve is grossly normal. 5. The tricuspid valve is grossly normal. 6. The aorta is normal unless otherwise noted.   03/2019 event monitor  14 day event monitor  Min HR 55, Max HR 161, Avg HR 80  Reported symptoms correlate with sinus rhythm  No significant arrhythmias  Assessment and Plan:  1. Palpitations   2. Essential hypertension   3. Mixed hyperlipidemia    1. Palpitations Still has palpitations but normally at night when lying in bed.  She states these are short-lived and are not causing any other symptoms other than she can feel them.  Continue metoprolol 25 mg a.m., metoprolol 50 mg p.m.,  2. Essential hypertension Blood pressure well controlled on current medications today with 126/82 BP.  Continue lisinopril 5 mg daily, triamterene/hydrochlorothiazide 37.5/25 mg daily.  3. Mixed hyperlipidemia LDL 199. Off statin due to  increased LFTs and statin associated muscle symptoms. (History of fatty liver disease).  Recent liver enzymes back to  normal GI office.  Please refer to lipid clinic for possible PCSK9 inhibitor initiation.  Medication Adjustments/Labs and Tests Ordered: Current medicines are reviewed at length with the patient today.  Concerns regarding medicines are outlined above.   Disposition: Follow-up with Dr. Wyline Mood or APP 6 months  Signed, Rennis Harding, NP 07/30/2020 8:23 AM    [facility] at [facility]  [address] [place], [place], [place] [address] Phone: [phone]; Fax: [phone]

## 2020-07-30 ENCOUNTER — Encounter: Payer: Self-pay | Admitting: *Deleted

## 2020-07-30 ENCOUNTER — Other Ambulatory Visit: Payer: Self-pay

## 2020-07-30 ENCOUNTER — Encounter: Payer: Self-pay | Admitting: Family Medicine

## 2020-07-30 ENCOUNTER — Ambulatory Visit (INDEPENDENT_AMBULATORY_CARE_PROVIDER_SITE_OTHER): Payer: Self-pay | Admitting: Family Medicine

## 2020-07-30 VITALS — BP 126/82 | HR 73 | Ht 64.0 in | Wt 219.2 lb

## 2020-07-30 DIAGNOSIS — E782 Mixed hyperlipidemia: Secondary | ICD-10-CM

## 2020-07-30 DIAGNOSIS — R002 Palpitations: Secondary | ICD-10-CM

## 2020-07-30 DIAGNOSIS — I10 Essential (primary) hypertension: Secondary | ICD-10-CM

## 2020-07-30 NOTE — Patient Instructions (Signed)
Medication Instructions:  Continue all current medications.  Labwork: none  Testing/Procedures: none  Follow-Up: 6 months   Any Other Special Instructions Will Be Listed Below (If Applicable). You have been referred to:  Lipid clinic   If you need a refill on your cardiac medications before your next appointment, please call your pharmacy.

## 2020-07-31 ENCOUNTER — Encounter: Payer: Self-pay | Admitting: *Deleted

## 2020-07-31 ENCOUNTER — Telehealth: Payer: Self-pay | Admitting: Endocrinology

## 2020-07-31 ENCOUNTER — Telehealth: Payer: Self-pay | Admitting: *Deleted

## 2020-07-31 NOTE — Telephone Encounter (Signed)
Called and spoke with patient to advised cholesterol was not checked at last appointment. Patient requested labs to check her cholesterol before next scheduled appointment in January of 2022.

## 2020-07-31 NOTE — Telephone Encounter (Signed)
Called pt to offer sooner appt for procedure but declined at this time 

## 2020-07-31 NOTE — Telephone Encounter (Signed)
Pt called states missed call today from [facility] office. Pt also asking if cholesterol was checked at last appt. Pt ph [phone].

## 2020-08-05 ENCOUNTER — Other Ambulatory Visit: Payer: Self-pay | Admitting: Endocrinology

## 2020-08-05 DIAGNOSIS — E1165 Type 2 diabetes mellitus with hyperglycemia: Secondary | ICD-10-CM

## 2020-08-05 DIAGNOSIS — E063 Autoimmune thyroiditis: Secondary | ICD-10-CM

## 2020-08-05 DIAGNOSIS — Z794 Long term (current) use of insulin: Secondary | ICD-10-CM

## 2020-08-06 ENCOUNTER — Encounter: Payer: Self-pay | Admitting: Neurology

## 2020-08-27 ENCOUNTER — Ambulatory Visit (INDEPENDENT_AMBULATORY_CARE_PROVIDER_SITE_OTHER): Payer: Self-pay | Admitting: Pharmacist Clinician (PhC)/ Clinical Pharmacy Specialist

## 2020-08-27 ENCOUNTER — Other Ambulatory Visit: Payer: Self-pay

## 2020-08-27 VITALS — BP 128/86 | HR 83 | Resp 14 | Ht 64.0 in | Wt 223.4 lb

## 2020-08-27 DIAGNOSIS — E782 Mixed hyperlipidemia: Secondary | ICD-10-CM

## 2020-08-27 DIAGNOSIS — E785 Hyperlipidemia, unspecified: Secondary | ICD-10-CM

## 2020-08-27 LAB — HEPATIC FUNCTION PANEL
ALT: 31 IU/L (ref 0–32)
AST: 23 IU/L (ref 0–40)
Albumin: 4.4 g/dL (ref 3.8–4.9)
Alkaline Phosphatase: 140 IU/L — ABNORMAL HIGH (ref 44–121)
Bilirubin Total: 0.3 mg/dL (ref 0.0–1.2)
Bilirubin, Direct: 0.11 mg/dL (ref 0.00–0.40)
Total Protein: 7.9 g/dL (ref 6.0–8.5)

## 2020-08-27 LAB — LIPID PANEL
Chol/HDL Ratio: 5.1 ratio — ABNORMAL HIGH (ref 0.0–4.4)
Cholesterol, Total: 244 mg/dL — ABNORMAL HIGH (ref 100–199)
HDL: 48 mg/dL (ref >39)
LDL Chol Calc (NIH): 159 mg/dL — ABNORMAL HIGH (ref 0–99)
Triglycerides: 204 mg/dL — ABNORMAL HIGH (ref 0–149)
VLDL Cholesterol Cal: 37 mg/dL (ref 5–40)

## 2020-08-27 NOTE — Patient Instructions (Signed)
Your Results:             Your most recent labs (02/2019) Goal  Total Cholesterol 246 < 200  Triglycerides 188 < 150  HDL (happy/good cholesterol) 45 > 40  LDL (lousy/bad cholesterol 163 < 70      Medication changes:  We will have you apply for [employer] Safety Net.  If they approve, you will receive free medication for the 2022 calendar year   Thank you,   [facility]

## 2020-08-27 NOTE — Progress Notes (Signed)
08/28/2020 Amy Lester 01/19/1971 [id number]   HPI:  Amy Lester is a 49 y.o. female patient of Dr Wyline Mood, who presents today for a lipid clinic evaluation.  See pertinent past medical history below.  She was last seen by Rennis Harding PA in November and noted that she had recently had an increase in LFT's as well as myalgia, associated with statin use.  She does not have any history of ASCVD.  Patient notes that she [facility] went [facility] and had blood work done there. Cholesterol labs came back showing LDL of 199.  She will need to get [place] a copy of these labs.  Past Medical History: hypertension Well controlled on lisinopril, metoprolol, triam/hctz  DM2 A1c 6.1 (11(21)- on Tresiba, metformin, glipizide, Ozempic - down from 9/5 3 months ago  hypothyroid TSH 1.89 (11/21) On levothyroxine  NAFLD Followed by GI  migraine On Aimovig, maxalt, ondansetron   Current Medications: ezetimibe 10 mg qd  Cholesterol Goals: LDL < 100   Intolerant/previously tried: atorvastatin, rosuvastatin, simvastatin - myalgias in arms, legs, LFT's  Family history: mother - htn, hld, stent, valve repair - now 63; father with heart problems, liver issues/alchoholism, died at 50 of cancer; sister with htn; one son and one daughter have genetic Feingold type 1 (son) 2 (daughter)  Diet: mostly home cooked, seeing dietician recently, has lost 20 lbs (gined back 10 with holidays) - working on less (and better) carbs, more proteins,  Exercise:  Very little - foot reconstruction 2 years ago - tries to walk, but SOB easily  Labs:  Today: TC 244, TG 204, HDL 48, LDL 159  LDL 199 - drawn by $30 clinic drawn in the past month  02/2019: TC 246, TG 188, HDL 45, LDL 163   Current Outpatient Medications  Medication Sig Dispense Refill  . albuterol (VENTOLIN HFA) 108 (90 Base) MCG/ACT inhaler Inhale 3 puffs into the lungs every 6 (six) hours as needed for wheezing or shortness of breath. 3 g 1  . cetirizine  (ZYRTEC) 10 MG tablet Take 1 tablet (10 mg total) by mouth daily. 90 tablet 0  . clotrimazole-betamethasone (LOTRISONE) cream Apply 1 application topically as needed. (Patient taking differently: Apply 1 application topically as needed (Outbreak).) 45 g 2  . Cyanocobalamin (VITAMIN B12 PO) Take by mouth every 30 (thirty) days. injection    . Erenumab-aooe (AIMOVIG, 140 MG DOSE,) 70 MG/ML SOAJ Inject 140 mg into the skin every 30 (thirty) days. 1.12 mL 5  . estradiol (EVAMIST) 1.53 MG/SPRAY transdermal spray Place 3 sprays onto the skin daily.    Marland Kitchen ezetimibe (ZETIA) 10 MG tablet Take 10 mg by mouth daily.     Marland Kitchen glipiZIDE (GLUCOTROL) 10 MG tablet Take 10 mg by mouth daily.     . insulin degludec (TRESIBA FLEXTOUCH) 100 UNIT/ML FlexTouch Pen Inject 20 units under the skin twice daily. 15 mL 0  . lisinopril (ZESTRIL) 5 MG tablet Take 1 tablet (5 mg total) by mouth daily. 90 tablet 1  . MAGNESIUM OXIDE PO Take by mouth.    . metFORMIN (GLUCOPHAGE) 1000 MG tablet Take 1,000 mg by mouth 2 (two) times daily.    . metoprolol tartrate (LOPRESSOR) 25 MG tablet Take 25 mg by mouth in the morning.    . metoprolol tartrate (LOPRESSOR) 50 MG tablet Take 50 mg by mouth at bedtime.    . metroNIDAZOLE (METROGEL) 1 % gel Apply 1 application topically at bedtime.     . montelukast (SINGULAIR)  10 MG tablet Take 10 mg by mouth every morning.    . Multiple Vitamins-Minerals (MULTIVITAMIN WITH MINERALS) tablet Take 1 tablet by mouth daily.    Marland Kitchen nystatin cream (MYCOSTATIN) Apply 1 application topically daily as needed for dry skin.    Marland Kitchen Olopatadine HCl (PAZEO OP) Apply to eye daily.    Marland Kitchen omeprazole (PRILOSEC) 40 MG capsule Take 1 capsule (40 mg total) by mouth 2 (two) times daily before a meal. 60 capsule 3  . ondansetron (ZOFRAN ODT) 4 MG disintegrating tablet Take 1 tablet (4 mg total) by mouth every 8 (eight) hours as needed for nausea or vomiting. 20 tablet 0  . ondansetron (ZOFRAN) 8 MG tablet Take 1 tablet (8 mg  total) by mouth every 8 (eight) hours as needed for nausea or vomiting. 20 tablet 1  . Red Yeast Rice Extract (RED YEAST RICE PO) Take 1 tablet by mouth daily.     . Riboflavin (VITAMIN B-2 PO) Take 1 tablet by mouth daily.     . rizatriptan (MAXALT) 10 MG tablet Take 1 tablet (10 mg total) by mouth 3 (three) times daily as needed for migraine. 10 tablet 5  . Semaglutide,0.25 or 0.5MG /DOS, (OZEMPIC, 0.25 OR 0.5 MG/DOSE,) 2 MG/1.5ML SOPN Inject 1 mg into the skin once a week.     Marland Kitchen SYNTHROID 125 MCG tablet Take two tablets (250mg  total) by mouth days 1-6 and skip day 7. (Patient taking differently: 225mg  by mouth days 1-6 and skip day 7.) 180 tablet 1  . TRETINOIN EX Apply topically daily.    triamcinolone cream (KENALOG) 0.1 % Apply 1 application topically 2 (two) times daily as needed. (Patient taking differently: Apply 1 application topically 2 (two) times daily as needed (rash).) 45 g 0  . triamterene-hydrochlorothiazide (MAXZIDE-25) 37.5-25 MG tablet Take 1 tablet by mouth daily. 90 tablet 1  . venlafaxine XR (EFFEXOR XR) 37.5 MG 24 hr capsule One tablet daily for one week, then take 2 a day 60 capsule 3   No current facility-administered medications for this visit.    Allergies  Allergen Reactions  . Crestor [Rosuvastatin]     myalgia  . Lipitor [Atorvastatin]     myalgia  . Percocet [Oxycodone-Acetaminophen] Nausea And Vomiting  . Zocor [Simvastatin]     myalgia    Past Medical History:  Diagnosis Date  . Asthma   . Blood transfusion without reported diagnosis   . Chronic migraine   . Common migraine with intractable migraine 02/16/2019  . Diabetes mellitus without complication (HCC)   . Endometriosis   . Fatty liver   . GERD (gastroesophageal reflux disease)   . Hyperlipidemia   . Hypertension   . Hypothyroidism   . Incontinence   . Irregular heartbeat   . Multiple sclerosis (HCC)   . Palpitations   . Postconcussive syndrome 05/09/2020  . Pre-diabetes   . Sleep apnea      Blood pressure 128/86, pulse 83, resp. rate 14, height 5\' 4"  (1.626 m), weight 223 lb 6.4 oz (101.3 kg), SpO2 99 %.   Hyperlipidemia Patient with elevated LDL, but no history of ASCVD.  She will bring 04/18/2019 a copy of labs showing familial hyperlipidemia. Reviewed options for lowering LDL cholesterol, including PCSK-9 inhibitors and bempedoic acid.  Discussed mechanisms of action, dosing, side effects and potential decreases in LDL cholesterol.  Answered all patient questions.  Based on this information, patient would prefer to star Repatha 140 mg SureClick.  We had her fill out patient  assistance application for [employer] in the office and will submit once we can get Dr. Wyline Mood to sign.  Will need repeat liver and lipid labs in 2-3 months.    Phillips Hay PharmD CPP [facility] [address] [place], [place] [address] [phone]

## 2020-08-28 ENCOUNTER — Telehealth: Payer: Self-pay

## 2020-08-28 ENCOUNTER — Encounter: Payer: Self-pay | Admitting: Pharmacist Clinician (PhC)/ Clinical Pharmacy Specialist

## 2020-08-28 DIAGNOSIS — E785 Hyperlipidemia, unspecified: Secondary | ICD-10-CM | POA: Insufficient documentation

## 2020-08-28 NOTE — Telephone Encounter (Signed)
calledpatient Amy Lester and asked her to get [place] a copy of her lipid labs showing LDL at 199. Pt voiced understanding

## 2020-08-28 NOTE — Assessment & Plan Note (Signed)
Patient with elevated LDL, but no history of ASCVD.  She will bring [place] a copy of labs showing familial hyperlipidemia. Reviewed options for lowering LDL cholesterol, including PCSK-9 inhibitors and bempedoic acid.  Discussed mechanisms of action, dosing, side effects and potential decreases in LDL cholesterol.  Answered all patient questions.  Based on this information, patient would prefer to star Repatha 140 mg SureClick.  We had her fill out patient assistance application for [employer] in the office and will submit once we can get Dr. Wyline Mood to sign.  Will need repeat liver and lipid labs in 2-3 months.

## 2020-09-03 NOTE — Telephone Encounter (Signed)
Called patient and informed her I haven't received her portion yet for the Aimovig [employer] patient support.  She stated she would be by tomorrow to complete.

## 2020-09-03 NOTE — Telephone Encounter (Signed)
Patient came by office filled out her portion of the patient assistance paperwork.  All paperwork is now complete and faxed to [employer].  Awaiting decision from [employer].
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
## 2020-09-11 ENCOUNTER — Ambulatory Visit: Payer: Self-pay | Admitting: Cardiology

## 2020-09-11 ENCOUNTER — Other Ambulatory Visit: Payer: Self-pay

## 2020-09-11 ENCOUNTER — Ambulatory Visit (INDEPENDENT_AMBULATORY_CARE_PROVIDER_SITE_OTHER): Payer: Self-pay | Admitting: Neurology

## 2020-09-11 ENCOUNTER — Encounter: Payer: Self-pay | Admitting: Neurology

## 2020-09-11 VITALS — BP 137/85 | HR 102 | Ht 64.0 in | Wt 225.2 lb

## 2020-09-11 DIAGNOSIS — G43719 Chronic migraine without aura, intractable, without status migrainosus: Secondary | ICD-10-CM

## 2020-09-11 DIAGNOSIS — R9082 White matter disease, unspecified: Secondary | ICD-10-CM | POA: Insufficient documentation

## 2020-09-11 MED ORDER — VENLAFAXINE HCL ER 75 MG PO CP24: 75.0000 mg | ORAL_CAPSULE | Freq: Every day | ORAL | 5 refills | Status: DC

## 2020-09-11 MED ORDER — RIZATRIPTAN BENZOATE 10 MG PO TABS: 10.0000 mg | ORAL_TABLET | Freq: Three times a day (TID) | ORAL | 5 refills | Status: DC | PRN

## 2020-09-11 NOTE — Progress Notes (Signed)
I have read the note, and I agree with the clinical assessment and plan.  Jacqualynn Parco K Girolamo Lortie   

## 2020-09-11 NOTE — Patient Instructions (Signed)
Increase Effexor 75 mg daily  Continue Aimovig, Maxalt See you back in 6 months

## 2020-09-17 NOTE — Telephone Encounter (Signed)
Reached patient by phone to notify her of notice from [employer] that she still needs to submit Proof of each household member's gross income for the most recent year. Patient was given number to call for [employer].  Patient expressed appreciation for the call.

## 2020-10-10 NOTE — Progress Notes (Deleted)
NEUROLOGY CONSULTATION NOTE  Amy Lester MRN: [id number] DOB: 1970-12-11  Referring provider: Leretha Pol, NP Primary care provider: Abbe Amsterdam, MD  Reason for consult:  Multiple sclerosis, migraines   Subjective:  Amy Lester is a 50 year old left-handed female who presents for multiple sclerosis and migraines.  History supplemented by referring provider's and prior neurologists' notes.  She was diagnosed with multiple sclerosis in 2011 in [place] at the MS clinic.  She was enrolled in some study and therefore her records are not available for review.  She presented with unsteady gait, dropping objects and pain in her arms and legs.  She had MRIs and lumbar puncture.  ***.  No visual disturbance such as vision loss or double vision.  Has some urinary incontinence.   She has migraines since ***  She moved to [facility] *** and established care with local neurologist.  MRI of brain with and without contrast on 03/16/2019 personally reviewed showed scattered nonspecific nonenhancing T2/FLAIR hyperintense foci in the cerebral white matter.  MRI of cervical spine with and without contrast showed no cord lesions.  Patient declined repeat lumbar puncture.  Past migraine rescue medications:  Sumatriptan tab,izatriptan, frovatriptan Past migraine preventatives:  topiramate, gabapentin, zonisamide, metoprolol, Botox  Current migraine rescue medications:  Zofran, rizatriptan 10mg  Current migraine preventatives:  Aimovig 140mg , venlafaxine XR 75mg   She has OSA ***   03/16/2019 MRI BRAIN W WO:  This MRI of the brain with and without contrast shows the following:   1.   There are scattered T2/flair hyperintense foci in the white matter both hemispheres.  Two foci are periventricular and the rest are in the subcortical or deep white matter.  They do not enhance or appear to be acute.  This is a nonspecific finding and could be due to chronic microvascular ischemic change or  demyelination.  Older images and not available for comparison.  2.  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  3.    There are no acute findings. 03/16/2019 MRI CERVICAL SPINE W WO:    This MRI of the cervical spine with and without contrast shows the following:  1.    The spinal cord appears normal.  There is no evidence of demyelination  2.    At C5-C6, there is a disc protrusion causing mild spinal stenosis and mild foraminal narrowing.  There is no nerve root compression.  3.    At C6-C7, there is a right paramedian disc herniation causing moderate spinal stenosis and moderate right foraminal narrowing with potential for right C7 nerve root impingement. 4.    At C7-T1 there is a mild left paramedian disc protrusion causing mild spinal stenosis but no foraminal narrowing or nerve root compression.  5.    There is a normal enhancement pattern.  PAST MEDICAL HISTORY: Past Medical History:  Diagnosis Date  . Asthma   . Blood transfusion without reported diagnosis   . Chronic migraine   . Common migraine with intractable migraine 02/16/2019  . Diabetes mellitus without complication (HCC)   . Endometriosis   . Fatty liver   . GERD (gastroesophageal reflux disease)   . Hyperlipidemia   . Hypertension   . Hypothyroidism   . Incontinence   . Irregular heartbeat   . Multiple sclerosis (HCC)   . Palpitations   . Postconcussive syndrome 05/09/2020  . Pre-diabetes   . Sleep apnea     PAST SURGICAL HISTORY: Past Surgical History:  Procedure  Laterality Date  . ABDOMINAL HYSTERECTOMY    . CESAREAN SECTION     x1  . CHOLECYSTECTOMY    . ENDOMETRIAL ABLATION    . FOOT SURGERY Left 06/2018   foot reconstruction  . REPLACEMENT TOTAL KNEE Left     MEDICATIONS: Current Outpatient Medications on File Prior to Visit  Medication Sig Dispense Refill  . albuterol (VENTOLIN HFA) 108 (90 Base) MCG/ACT inhaler Inhale 3 puffs into the lungs every 6 (six) hours as  needed for wheezing or shortness of breath. 3 g 1  . cetirizine (ZYRTEC) 10 MG tablet Take 1 tablet (10 mg total) by mouth daily. 90 tablet 0  . clotrimazole-betamethasone (LOTRISONE) cream Apply 1 application topically as needed. (Patient taking differently: Apply 1 application topically as needed (Outbreak).) 45 g 2  . Cyanocobalamin (VITAMIN B12 PO) Take by mouth every 30 (thirty) days. injection    . Erenumab-aooe (AIMOVIG, 140 MG DOSE,) 70 MG/ML SOAJ Inject 140 mg into the skin every 30 (thirty) days. 1.12 mL 5  . erythromycin ophthalmic ointment Place 1 application into both eyes at bedtime.    Marland Kitchen estradiol (EVAMIST) 1.53 MG/SPRAY transdermal spray Place 3 sprays onto the skin daily.    Marland Kitchen ezetimibe (ZETIA) 10 MG tablet Take 10 mg by mouth daily.     Marland Kitchen glipiZIDE (GLUCOTROL) 10 MG tablet Take 10 mg by mouth daily.     . insulin degludec (TRESIBA FLEXTOUCH) 100 UNIT/ML FlexTouch Pen Inject 20 units under the skin twice daily. 15 mL 0  . lisinopril (ZESTRIL) 5 MG tablet Take 1 tablet (5 mg total) by mouth daily. 90 tablet 1  . MAGNESIUM OXIDE PO Take by mouth.    . metFORMIN (GLUCOPHAGE) 1000 MG tablet Take 1,000 mg by mouth 2 (two) times daily.    . metoprolol tartrate (LOPRESSOR) 25 MG tablet Take 25 mg by mouth in the morning.    . metoprolol tartrate (LOPRESSOR) 50 MG tablet Take 50 mg by mouth at bedtime.    . metroNIDAZOLE (METROGEL) 1 % gel Apply 1 application topically at bedtime.     . montelukast (SINGULAIR) 10 MG tablet Take 10 mg by mouth every morning.    . Multiple Vitamins-Minerals (MULTIVITAMIN WITH MINERALS) tablet Take 1 tablet by mouth daily.    Marland Kitchen nystatin cream (MYCOSTATIN) Apply 1 application topically daily as needed for dry skin.    Marland Kitchen Olopatadine HCl (PAZEO OP) Apply to eye daily.    Marland Kitchen omeprazole (PRILOSEC) 40 MG capsule Take 1 capsule (40 mg total) by mouth 2 (two) times daily before a meal. 60 capsule 3  . ondansetron (ZOFRAN ODT) 4 MG disintegrating tablet Take 1  tablet (4 mg total) by mouth every 8 (eight) hours as needed for nausea or vomiting. 20 tablet 0  . ondansetron (ZOFRAN) 8 MG tablet Take 1 tablet (8 mg total) by mouth every 8 (eight) hours as needed for nausea or vomiting. 20 tablet 1  . Red Yeast Rice Extract (RED YEAST RICE PO) Take 1 tablet by mouth daily.     . Riboflavin (VITAMIN B-2 PO) Take 1 tablet by mouth daily.     . rizatriptan (MAXALT) 10 MG tablet Take 1 tablet (10 mg total) by mouth 3 (three) times daily as needed for migraine. 10 tablet 5  . Semaglutide,0.25 or 0.5MG /DOS, (OZEMPIC, 0.25 OR 0.5 MG/DOSE,) 2 MG/1.5ML SOPN Inject 1 mg into the skin once a week.     Marland Kitchen SYNTHROID 125 MCG tablet Take two tablets (250mg  total)  by mouth days 1-6 and skip day 7. (Patient taking differently: 225mg  by mouth days 1-6 and skip day 7.) 180 tablet 1  . TRETINOIN EX Apply topically daily.    triamcinolone cream (KENALOG) 0.1 % Apply 1 application topically 2 (two) times daily as needed. (Patient taking differently: Apply 1 application topically 2 (two) times daily as needed (rash).) 45 g 0  . triamterene-hydrochlorothiazide (MAXZIDE-25) 37.5-25 MG tablet Take 1 tablet by mouth daily. 90 tablet 1  . venlafaxine XR (EFFEXOR XR) 75 MG 24 hr capsule Take 1 capsule (75 mg total) by mouth daily with breakfast. 30 capsule 5   No current facility-administered medications on file prior to visit.    ALLERGIES: Allergies  Allergen Reactions  . Crestor [Rosuvastatin]     myalgia  . Lipitor [Atorvastatin]     myalgia  . Percocet [Oxycodone-Acetaminophen] Nausea And Vomiting  . Zocor [Simvastatin]     myalgia    FAMILY HISTORY: Family History  Problem Relation Age of Onset  . Heart attack Mother   . Stroke Mother   . Cancer Mother        breast cancer  . Hyperlipidemia Mother   . Hypothyroidism Mother   . Cancer Father        lymph nodes, liver cancer  . Colon cancer Father        early 2s  . Other Daughter        Fine Gold Type II  Syndrome and cognetive issues  . Memory loss Daughter   . Other Son        Fine Gold Syndrome and BPES  . Diabetes Son 22       IDDM  . Hypothyroidism Sister   . Cancer Sister   . Hyperthyroidism Neg Hx    SOCIAL HISTORY: Social History   Socioeconomic History  . Marital status: Married    Spouse name: Not on file  . Number of children: Not on file  . Years of education: 73  . Highest education level: Not on file  Occupational History  . Not on file  Tobacco Use  . Smoking status: Never Smoker  . Smokeless tobacco: Never Used  Vaping Use  . Vaping Use: Never used  Substance and Sexual Activity  . Alcohol use: Yes    Comment: once a year  . Drug use: Never  . Sexual activity: Not on file  Other Topics Concern  . Not on file  Social History Narrative   Left handed    Caffeine 1 daily    Lives at home with husband and adult daughter   Social Determinants of Health   Financial Resource Strain: Not on file  Food Insecurity: Not on file  Transportation Needs: Not on file  Physical Activity: Not on file  Stress: Not on file  Social Connections: Not on file  Intimate Partner Violence: Not on file    Objective:  *** General: No acute distress.  Patient appears well-groomed.   Head:  Normocephalic/atraumatic Eyes:  fundi examined but not visualized Neck: supple, no paraspinal tenderness, full range of motion Back: No paraspinal tenderness Heart: regular rate and rhythm Lungs: Clear to auscultation bilaterally. Vascular: No carotid bruits. Neurological Exam: Mental status: alert and oriented to person, place, and time, recent and remote memory intact, fund of knowledge intact, attention and concentration intact, speech fluent and not dysarthric, language intact. Cranial nerves: CN I: not tested CN II: pupils equal, round and reactive to light, visual fields intact CN  III, IV, VI:  full range of motion, no nystagmus, no ptosis CN V: facial sensation intact. CN  VII: upper and lower face symmetric CN VIII: hearing intact CN IX, X: gag intact, uvula midline CN XI: sternocleidomastoid and trapezius muscles intact CN XII: tongue midline Bulk & Tone: normal, no fasciculations. Motor:  muscle strength 5/5 throughout Sensation:  Pinprick, temperature and vibratory sensation intact. Deep Tendon Reflexes:  2+ throughout,  toes downgoing.   Finger to nose testing:  Without dysmetria.   Heel to shin:  Without dysmetria.   Gait:  Normal station and stride.  Romberg negative.  Assessment/Plan:   ***    Thank you for allowing me to take part in the care of this patient.  Shon Millet, DO  CC: ***

## 2020-10-11 ENCOUNTER — Ambulatory Visit: Payer: Self-pay | Admitting: Neurology

## 2020-10-11 ENCOUNTER — Encounter: Payer: Medicaid Other | Attending: Endocrinology | Admitting: Dietician

## 2020-10-11 ENCOUNTER — Telehealth: Payer: Self-pay | Admitting: Dietician

## 2020-10-23 ENCOUNTER — Encounter: Payer: Self-pay | Admitting: Emergency Medicine

## 2020-10-23 ENCOUNTER — Ambulatory Visit
Admission: EM | Admit: 2020-10-23 | Discharge: 2020-10-23 | Disposition: A | Discharge status: Home / Self Care | Payer: Medicaid Other | Attending: Emergency Medicine | Admitting: Emergency Medicine

## 2020-10-23 ENCOUNTER — Other Ambulatory Visit: Payer: Self-pay

## 2020-10-23 ENCOUNTER — Other Ambulatory Visit (HOSPITAL_COMMUNITY): Payer: Self-pay | Admitting: Family Medicine

## 2020-10-23 DIAGNOSIS — L02419 Cutaneous abscess of limb, unspecified: Secondary | ICD-10-CM

## 2020-10-23 DIAGNOSIS — Z13820 Encounter for screening for osteoporosis: Secondary | ICD-10-CM

## 2020-10-23 MED ORDER — DOXYCYCLINE HYCLATE 100 MG PO CAPS: 100.0000 mg | ORAL_CAPSULE | Freq: Two times a day (BID) | ORAL | 0 refills | Status: DC

## 2020-10-23 MED ORDER — DOXYCYCLINE HYCLATE 100 MG PO CAPS
100.0000 mg | ORAL_CAPSULE | Freq: Two times a day (BID) | ORAL | 0 refills | Status: DC
Start: 2020-10-23 — End: 2020-10-23

## 2020-10-23 NOTE — Discharge Instructions (Addendum)
Apply warm compresses 3-4x daily for 10-15 minutes Wash site daily with warm water and mild soap Take antibiotic as prescribed and to completion Follow up here or with PCP if symptoms persists Return or go to the ED if you have any new or worsening symptoms increased redness, swelling, pain, nausea, vomiting, fever, chills, etc...  

## 2020-10-23 NOTE — ED Provider Notes (Signed)
[facility]   [id number] 10/23/20 Arrival Time: 1450   MG:NOIBBCW  SUBJECTIVE:  Amy Lester is a 50 y.o. female who presents with a possible abscess of her right armpit for the past 1 week.  Denies any precipitating event.  Localized pain and tenderness to the right armpit.  Denies drainage at this time.  Has not tried any OTC medication.  Denies similar symptoms in the past.  Denies alleviating factors.  Denies chills, fever, nausea, vomiting, diarrhea.  Abscess ROS: As per HPI.  All other pertinent ROS negative.     Past Medical History:  Diagnosis Date  . Asthma   . Blood transfusion without reported diagnosis   . Chronic migraine   . Common migraine with intractable migraine 02/16/2019  . Diabetes mellitus without complication (HCC)   . Endometriosis   . Fatty liver   . GERD (gastroesophageal reflux disease)   . Hyperlipidemia   . Hypertension   . Hypothyroidism   . Incontinence   . Irregular heartbeat   . Multiple sclerosis (HCC)   . Palpitations   . Postconcussive syndrome 05/09/2020  . Pre-diabetes   . Sleep apnea    Past Surgical History:  Procedure Laterality Date  . ABDOMINAL HYSTERECTOMY    . CESAREAN SECTION     x1  . CHOLECYSTECTOMY    . ENDOMETRIAL ABLATION    . FOOT SURGERY Left 06/2018   foot reconstruction  . REPLACEMENT TOTAL KNEE Left    Allergies  Allergen Reactions  . Crestor [Rosuvastatin]     myalgia  . Lipitor [Atorvastatin]     myalgia  . Percocet [Oxycodone-Acetaminophen] Nausea And Vomiting  . Zocor [Simvastatin]     myalgia   No current facility-administered medications on file prior to encounter.   Current Outpatient Medications on File Prior to Encounter  Medication Sig Dispense Refill  . albuterol (VENTOLIN HFA) 108 (90 Base) MCG/ACT inhaler Inhale 3 puffs into the lungs every 6 (six) hours as needed for wheezing or shortness of breath. 3 g 1  . cetirizine (ZYRTEC) 10 MG tablet Take 1 tablet (10 mg total) by mouth  daily. 90 tablet 0  . clotrimazole-betamethasone (LOTRISONE) cream Apply 1 application topically as needed. (Patient taking differently: Apply 1 application topically as needed (Outbreak).) 45 g 2  . Cyanocobalamin (VITAMIN B12 PO) Take by mouth every 30 (thirty) days. injection    . Erenumab-aooe (AIMOVIG, 140 MG DOSE,) 70 MG/ML SOAJ Inject 140 mg into the skin every 30 (thirty) days. 1.12 mL 5  . erythromycin ophthalmic ointment Place 1 application into both eyes at bedtime.    Marland Kitchen estradiol (EVAMIST) 1.53 MG/SPRAY transdermal spray Place 3 sprays onto the skin daily.    Marland Kitchen ezetimibe (ZETIA) 10 MG tablet Take 10 mg by mouth daily.     Marland Kitchen glipiZIDE (GLUCOTROL) 10 MG tablet Take 10 mg by mouth daily.     . insulin degludec (TRESIBA FLEXTOUCH) 100 UNIT/ML FlexTouch Pen Inject 20 units under the skin twice daily. 15 mL 0  . lisinopril (ZESTRIL) 5 MG tablet Take 1 tablet (5 mg total) by mouth daily. 90 tablet 1  . MAGNESIUM OXIDE PO Take by mouth.    . metFORMIN (GLUCOPHAGE) 1000 MG tablet Take 1,000 mg by mouth 2 (two) times daily.    . metoprolol tartrate (LOPRESSOR) 25 MG tablet Take 25 mg by mouth in the morning.    . metoprolol tartrate (LOPRESSOR) 50 MG tablet Take 50 mg by mouth at bedtime.    Marland Kitchen  metroNIDAZOLE (METROGEL) 1 % gel Apply 1 application topically at bedtime.     . montelukast (SINGULAIR) 10 MG tablet Take 10 mg by mouth every morning.    . Multiple Vitamins-Minerals (MULTIVITAMIN WITH MINERALS) tablet Take 1 tablet by mouth daily.    Marland Kitchen nystatin cream (MYCOSTATIN) Apply 1 application topically daily as needed for dry skin.    Marland Kitchen Olopatadine HCl (PAZEO OP) Apply to eye daily.    Marland Kitchen omeprazole (PRILOSEC) 40 MG capsule Take 1 capsule (40 mg total) by mouth 2 (two) times daily before a meal. 60 capsule 3  . ondansetron (ZOFRAN ODT) 4 MG disintegrating tablet Take 1 tablet (4 mg total) by mouth every 8 (eight) hours as needed for nausea or vomiting. 20 tablet 0  . ondansetron (ZOFRAN) 8 MG  tablet Take 1 tablet (8 mg total) by mouth every 8 (eight) hours as needed for nausea or vomiting. 20 tablet 1  . Red Yeast Rice Extract (RED YEAST RICE PO) Take 1 tablet by mouth daily.     . Riboflavin (VITAMIN B-2 PO) Take 1 tablet by mouth daily.     . rizatriptan (MAXALT) 10 MG tablet Take 1 tablet (10 mg total) by mouth 3 (three) times daily as needed for migraine. 10 tablet 5  . Semaglutide,0.25 or 0.5MG /DOS, (OZEMPIC, 0.25 OR 0.5 MG/DOSE,) 2 MG/1.5ML SOPN Inject 1 mg into the skin once a week.     Marland Kitchen SYNTHROID 125 MCG tablet Take two tablets (250mg  total) by mouth days 1-6 and skip day 7. (Patient taking differently: 225mg  by mouth days 1-6 and skip day 7.) 180 tablet 1  . TRETINOIN EX Apply topically daily.    triamcinolone cream (KENALOG) 0.1 % Apply 1 application topically 2 (two) times daily as needed. (Patient taking differently: Apply 1 application topically 2 (two) times daily as needed (rash).) 45 g 0  . triamterene-hydrochlorothiazide (MAXZIDE-25) 37.5-25 MG tablet Take 1 tablet by mouth daily. 90 tablet 1  . venlafaxine XR (EFFEXOR XR) 75 MG 24 hr capsule Take 1 capsule (75 mg total) by mouth daily with breakfast. 30 capsule 5   Social History   Socioeconomic History  . Marital status: Married    Spouse name: Not on file  . Number of children: Not on file  . Years of education: 79  . Highest education level: Not on file  Occupational History  . Not on file  Tobacco Use  . Smoking status: Never Smoker  . Smokeless tobacco: Never Used  Vaping Use  . Vaping Use: Never used  Substance and Sexual Activity  . Alcohol use: Yes    Comment: once a year  . Drug use: Never  . Sexual activity: Not on file  Other Topics Concern  . Not on file  Social History Narrative   Left handed    Caffeine 1 daily    Lives at home with husband and adult daughter   Social Determinants of Health   Financial Resource Strain: Not on file  Food Insecurity: Not on file  Transportation  Needs: Not on file  Physical Activity: Not on file  Stress: Not on file  Social Connections: Not on file  Intimate Partner Violence: Not on file   Family History  Problem Relation Age of Onset  . Heart attack Mother   . Stroke Mother   . Cancer Mother        breast cancer  . Hyperlipidemia Mother   . Hypothyroidism Mother   . Cancer Father  lymph nodes, liver cancer  . Colon cancer Father        early 65s  . Other Daughter        Fine Gold Type II Syndrome and cognetive issues  . Memory loss Daughter   . Other Son        Fine Gold Syndrome and BPES  . Diabetes Son 22       IDDM  . Hypothyroidism Sister   . Cancer Sister   . Hyperthyroidism Neg Hx     OBJECTIVE:  Vitals:   10/23/20 1504  BP: (!) 159/91  Pulse: 77  Resp: 18  Temp: 97.9 F (36.6 C)  TempSrc: Oral  SpO2: 97%     General appearance: alert; no distress Heart: RRR, no rub, gallop or murmur Chest: CTA, heart sounds normal Skin: 1 cm induration of her the right armpit; tender to touch; no active drainage Psychological: alert and cooperative; normal mood and affect    ASSESSMENT & PLAN:  1. Armpit abscess     Meds ordered this encounter  Medications  . doxycycline (VIBRAMYCIN) 100 MG capsule    Sig: Take 1 capsule (100 mg total) by mouth 2 (two) times daily.    Dispense:  20 capsule    Refill:  0   Discharge instructions  Apply warm compresses 3-4x daily for 10-15 minutes Wash site daily with warm water and mild soap Take antibiotic as prescribed and to completion Follow up here or with PCP if symptoms persists Return or go to the ED if you have any new or worsening symptoms increased redness, swelling, pain, nausea, vomiting, fever, chills, etc...   Reviewed expectations re: course of current medical issues. Questions answered. Outlined signs and symptoms indicating need for more acute intervention. Patient verbalized understanding. After Visit Summary  given.                Durward Parcel, FNP 10/23/20 1525

## 2020-10-23 NOTE — ED Triage Notes (Signed)
Pain in right armpit area x 1 week.  Has gotten worse over time and hurts to touch it.

## 2020-10-24 ENCOUNTER — Emergency Department (HOSPITAL_COMMUNITY)
Admission: EM | Admit: 2020-10-24 | Discharge: 2020-10-24 | Discharge status: Against Medical Advice | Payer: Medicaid Other

## 2020-10-24 NOTE — ED Triage Notes (Signed)
Advised it would probably be an hour before she would be placed in a room. States she will return at a later time. Advised we would be glad to treat her.

## 2020-10-25 ENCOUNTER — Telehealth: Payer: Self-pay

## 2020-10-25 NOTE — Telephone Encounter (Signed)
Transition Care Management Unsuccessful Follow-up Telephone Call  Date of discharge and from where:  10/24/2020 from [place]  Attempts:  1st Attempt  Reason for unsuccessful TCM follow-up call:  Unable to leave message     

## 2020-10-28 ENCOUNTER — Other Ambulatory Visit: Payer: Self-pay

## 2020-10-29 ENCOUNTER — Ambulatory Visit (INDEPENDENT_AMBULATORY_CARE_PROVIDER_SITE_OTHER): Payer: Medicaid Other | Admitting: Family Medicine

## 2020-10-29 ENCOUNTER — Ambulatory Visit (INDEPENDENT_AMBULATORY_CARE_PROVIDER_SITE_OTHER): Payer: Medicaid Other | Admitting: Endocrinology

## 2020-10-29 ENCOUNTER — Encounter: Payer: Self-pay | Admitting: Family Medicine

## 2020-10-29 ENCOUNTER — Encounter: Payer: Self-pay | Admitting: Endocrinology

## 2020-10-29 VITALS — BP 138/90 | HR 91 | Ht 64.0 in | Wt 221.4 lb

## 2020-10-29 VITALS — BP 118/92 | HR 91 | Temp 97.7°F | Ht 64.0 in | Wt 221.0 lb

## 2020-10-29 DIAGNOSIS — I10 Essential (primary) hypertension: Secondary | ICD-10-CM

## 2020-10-29 DIAGNOSIS — E063 Autoimmune thyroiditis: Secondary | ICD-10-CM

## 2020-10-29 DIAGNOSIS — E538 Deficiency of other specified B group vitamins: Secondary | ICD-10-CM

## 2020-10-29 DIAGNOSIS — M79621 Pain in right upper arm: Secondary | ICD-10-CM

## 2020-10-29 DIAGNOSIS — E1165 Type 2 diabetes mellitus with hyperglycemia: Secondary | ICD-10-CM

## 2020-10-29 DIAGNOSIS — E1169 Type 2 diabetes mellitus with other specified complication: Secondary | ICD-10-CM

## 2020-10-29 DIAGNOSIS — E669 Obesity, unspecified: Secondary | ICD-10-CM

## 2020-10-29 DIAGNOSIS — Z794 Long term (current) use of insulin: Secondary | ICD-10-CM

## 2020-10-29 LAB — POCT GLYCOSYLATED HEMOGLOBIN (HGB A1C): Hemoglobin A1C: 6.7 % — AB (ref 4.0–5.6)

## 2020-10-29 MED ORDER — OZEMPIC (1 MG/DOSE) 2 MG/1.5ML ~~LOC~~ SOPN: 1.0000 mg | PEN_INJECTOR | SUBCUTANEOUS | 1 refills | Status: DC

## 2020-10-29 MED ORDER — CYANOCOBALAMIN 1000 MCG/ML IJ SOLN
1000.0000 ug | Freq: Once | INTRAMUSCULAR | Status: AC
  Administered 2020-10-29: 1000 ug via INTRAMUSCULAR

## 2020-10-29 NOTE — Progress Notes (Signed)
Patient ID: Amy Lester, female   DOB: March 28, 1971, 50 y.o.   MRN: [id number]           Reason for Appointment: Endocrinology follow-up  Referring PCP: Abbe Amsterdam   History of Present Illness:          Date of diagnosis of type 2 diabetes mellitus:   2018      Background history:   She apparently had been told to have prediabetes prior to diagnosis of diabetes.  However she did not have gestational diabetes with her 3 pregnancies She remembers her A1c being 9% at the time of diagnosis but records are not available She was tried on Metformin but this caused abdominal discomfort and she did not take this and was not given any other treatment She thinks her blood sugars improved with better diet  Recent history:     Non-insulin hypoglycemic drugs the patient is taking are: Metformin 500 mg twice daily, Ozempic 0.5 mg weekly  INSULIN regimen: Tresiba 20 units twice daily  Her A1c is slightly higher at 6.7 compared to 6.1 Highest 9.5  Current management, blood sugar patterns and problems identified:  She was advised to double up on her Ozempic doses and take 1 mg weekly but she forgot  Not clear why her blood sugars are relatively higher  She thinks she is usually trying to improve her diet although still goes off her diet and eating poorly at times  She is not able to do much walking for exercise but she is trying to do some upper body exercises  She again did not bring her monitor for review  Postprandial readings reportedly are still mostly high and likely averaging over 200  However fasting readings are near normal and no hypoglycemia reported        Side effects from medications have been: Abdominal distress from Metformin               Glucose monitoring:  done 0-1 times a day         Glucometer:  Generic    Blood Glucose readings by recall recently as below:    PRE-MEAL Fasting Lunch Dinner Bedtime Overall  Glucose range:  100-115      Mean/median:      ?   POST-MEAL PC Breakfast PC Lunch PC Dinner  Glucose range:    170-250  Mean/median:       Dietician visit, most recent: 06/14/2020  Weight history:  Wt Readings from Last 3 Encounters:  10/29/20 221 lb 6.4 oz (100.4 kg)  10/29/20 221 lb (100.2 kg)  09/11/20 225 lb 3.2 oz (102.2 kg)    Glycemic control:   Lab Results  Component Value Date   HGBA1C 6.7 (A) 10/29/2020   HGBA1C 6.1 (A) 07/15/2020   HGBA1C 9.5 (A) 02/19/2020   Lab Results  Component Value Date   MICROALBUR 0.9 07/24/2019   LDLCALC 159 (H) 08/27/2020   CREATININE 0.77 07/24/2020   Lab Results  Component Value Date   MICRALBCREAT 0.8 07/24/2019    No results found for: FRUCTOSAMINE  HYPOTHYROIDISM: See review of systems    Office Visit on 10/29/2020  Component Date Value Ref Range Status  . Hemoglobin A1C 10/29/2020 6.7* 4.0 - 5.6 % Final    Allergies as of 10/29/2020      Reactions   Crestor [rosuvastatin]    myalgia   Lipitor [atorvastatin]    myalgia   Percocet [oxycodone-acetaminophen] Nausea And Vomiting   Zocor [simvastatin]  myalgia      Medication List       Accurate as of October 29, 2020 10:20 AM. If you have any questions, ask your nurse or doctor.        STOP taking these medications   ondansetron 4 MG disintegrating tablet Commonly known as: Zofran ODT Stopped by: Evelena Peat, MD   VITAMIN B12 PO Stopped by: Evelena Peat, MD     TAKE these medications   Aimovig (140 MG Dose) 70 MG/ML Soaj Generic drug: Erenumab-aooe Inject 140 mg into the skin every 30 (thirty) days.   albuterol 108 (90 Base) MCG/ACT inhaler Commonly known as: VENTOLIN HFA Inhale 3 puffs into the lungs every 6 (six) hours as needed for wheezing or shortness of breath.   amoxicillin 500 MG capsule Commonly known as: AMOXIL Take 500 mg by mouth 3 (three) times daily.   B-12 PO Take by mouth daily.   cetirizine 10 MG tablet Commonly known as: ZYRTEC Take 1 tablet (10 mg total)  by mouth daily.   clotrimazole-betamethasone cream Commonly known as: Lotrisone Apply 1 application topically as needed. What changed: reasons to take this   doxycycline 100 MG capsule Commonly known as: VIBRAMYCIN Take 1 capsule (100 mg total) by mouth 2 (two) times daily.   erythromycin ophthalmic ointment Place 1 application into both eyes at bedtime.   estradiol 1.53 MG/SPRAY transdermal spray Commonly known as: EVAMIST Place 3 sprays onto the skin daily.   ezetimibe 10 MG tablet Commonly known as: ZETIA Take 10 mg by mouth daily.   glipiZIDE 10 MG tablet Commonly known as: GLUCOTROL Take 10 mg by mouth daily.   lisinopril 5 MG tablet Commonly known as: ZESTRIL Take 1 tablet (5 mg total) by mouth daily.   MAGNESIUM OXIDE PO Take by mouth.   metFORMIN 1000 MG tablet Commonly known as: GLUCOPHAGE Take 1,000 mg by mouth 2 (two) times daily.   metoprolol tartrate 50 MG tablet Commonly known as: LOPRESSOR Take 50 mg by mouth at bedtime.   metoprolol tartrate 25 MG tablet Commonly known as: LOPRESSOR Take 25 mg by mouth in the morning.   metroNIDAZOLE 1 % gel Commonly known as: METROGEL Apply 1 application topically at bedtime.   montelukast 10 MG tablet Commonly known as: SINGULAIR Take 10 mg by mouth every morning.   multivitamin with minerals tablet Take 1 tablet by mouth daily.   nystatin cream Commonly known as: MYCOSTATIN Apply 1 application topically daily as needed for dry skin.   omeprazole 40 MG capsule Commonly known as: PRILOSEC Take 1 capsule (40 mg total) by mouth 2 (two) times daily before a meal. What changed: when to take this   ondansetron 8 MG tablet Commonly known as: ZOFRAN Take 1 tablet (8 mg total) by mouth every 8 (eight) hours as needed for nausea or vomiting.   Ozempic (0.25 or 0.5 MG/DOSE) 2 MG/1.5ML Sopn Generic drug: Semaglutide(0.25 or 0.5MG /DOS) Inject 1 mg into the skin once a week.   PAZEO OP Apply to eye  daily.   RED YEAST RICE PO Take 1 tablet by mouth daily.   rizatriptan 10 MG tablet Commonly known as: Maxalt Take 1 tablet (10 mg total) by mouth 3 (three) times daily as needed for migraine.   Synthroid 125 MCG tablet Generic drug: levothyroxine Take two tablets (250mg  total) by mouth days 1-6 and skip day 7. What changed: additional instructions   Tresiba FlexTouch 100 UNIT/ML FlexTouch Pen Generic drug: insulin degludec Inject 20 units under  the skin twice daily.   TRETINOIN EX Apply topically daily.   triamcinolone 0.1 % Commonly known as: KENALOG Apply 1 application topically 2 (two) times daily as needed. What changed: reasons to take this   triamterene-hydrochlorothiazide 37.5-25 MG tablet Commonly known as: MAXZIDE-25 Take 1 tablet by mouth daily.   venlafaxine XR 75 MG 24 hr capsule Commonly known as: Effexor XR Take 1 capsule (75 mg total) by mouth daily with breakfast.   VITAMIN B-2 PO Take 1 tablet by mouth daily.       Allergies:  Allergies  Allergen Reactions  . Crestor [Rosuvastatin]     myalgia  . Lipitor [Atorvastatin]     myalgia  . Percocet [Oxycodone-Acetaminophen] Nausea And Vomiting  . Zocor [Simvastatin]     myalgia    Past Medical History:  Diagnosis Date  . Asthma   . Blood transfusion without reported diagnosis   . Chronic migraine   . Common migraine with intractable migraine 02/16/2019  . Diabetes mellitus without complication (HCC)   . Endometriosis   . Fatty liver   . GERD (gastroesophageal reflux disease)   . Hyperlipidemia   . Hypertension   . Hypothyroidism   . Incontinence   . Irregular heartbeat   . Multiple sclerosis (HCC)   . Palpitations   . Postconcussive syndrome 05/09/2020  . Pre-diabetes   . Sleep apnea     Past Surgical History:  Procedure Laterality Date  . ABDOMINAL HYSTERECTOMY    . CESAREAN SECTION     x1  . CHOLECYSTECTOMY    . ENDOMETRIAL ABLATION    . FOOT SURGERY Left 06/2018   foot  reconstruction  . REPLACEMENT TOTAL KNEE Left     Family History  Problem Relation Age of Onset  . Heart attack Mother   . Stroke Mother   . Cancer Mother        breast cancer  . Hyperlipidemia Mother   . Hypothyroidism Mother   . Cancer Father        lymph nodes, liver cancer  . Colon cancer Father        early 55s  . Other Daughter        Fine Gold Type II Syndrome and cognetive issues  . Memory loss Daughter   . Other Son        Fine Gold Syndrome and BPES  . Diabetes Son 22       IDDM  . Hypothyroidism Sister   . Cancer Sister   . Hyperthyroidism Neg Hx     Social History:  reports that she has never smoked. She has never used smokeless tobacco. She reports current alcohol use. She reports that she does not use drugs.   Review of Systems  HYPOTHYROIDISM: She initially had symptoms of fatigue, hair loss and difficulties with weight when she was a teenager She also has a strong family history of hypothyroidism She likely has been on thyroid supplements for about 25 years or more.  Fairly consistently  Although she has been on Synthroid mostly about 2 years ago an outside physician told her to take liothyronine also that she did not know why and she did not feel any better with starting this combination She previously was on levothyroxine 225 mcg and Cytomel 5 mcg for over 2 years  This was changed to bone 250 mcg on her visit in 11/20 Is taking this 6/7 days a week The last 2 TSH levels have been normal  She is complaining of  feeling excessively tired and is also having some difficulty sleeping but this is only the last 3 weeks She thinks she forgot to take her levothyroxine for a few days subordinate 2 to 3 weeks ago when she was out of town  Lab Results  Component Value Date   TSH 1.89 07/15/2020   TSH 0.40 03/18/2020   TSH 0.11 (L) 02/19/2020   FREET4 1.04 07/24/2019   FREET4 0.73 02/24/2019   FREET4 0.62 (L) 11/25/2018    Lipid history: She was given  Lipitor 10 mg by her PCP but  it caused some upper body muscle aches She is now going to see a local cardiologist and may be able to get Repatha    Lab Results  Component Value Date   CHOL 244 (H) 08/27/2020   HDL 48 08/27/2020   LDLCALC 159 (H) 08/27/2020   TRIG 204 (H) 08/27/2020   CHOLHDL 5.1 (H) 08/27/2020           Hypertension: Has been present since age 89 Treated with lisinopril, metoprolol and Maxzide and followed by local PCP and cardiologist Blood pressure readings:  BP Readings from Last 3 Encounters:  10/29/20 138/90  10/29/20 (!) 118/92  10/23/20 (!) 159/91  .  Lab Results  Component Value Date   K 4.3 07/24/2020     Most recent foot exam: 11/20  Currently known complications of diabetes:?  Neuropathy  LABS:  Office Visit on 10/29/2020  Component Date Value Ref Range Status  . Hemoglobin A1C 10/29/2020 6.7* 4.0 - 5.6 % Final    Physical Examination:  BP 138/90   Pulse 91   Ht 5\' 4"  (1.626 m)   Wt 221 lb 6.4 oz (100.4 kg)   SpO2 99%   BMI 38.00 kg/m       ASSESSMENT:  Diabetes type 2 with obesity  See history of present illness for detailed discussion of current diabetes management, blood sugar patterns and problems identified  A1c is relatively higher at 6.7  Current treatment regimen is twice a day Tresiba insulin 20 units and 0. 50 mg weekly Ozempic along with 1 g Metformin  She is still able to take her medications including Ozempic and Tresiba consistently with patient assistance program  Although her fasting readings are fairly good she has higher readings after meals Can do better with her diet as she admits not being consistent despite making changes after seeing the dietitian Weight gain continues to be a problem  Still using a generic monitor and actual readings are not available  HYPOTHYROIDISM: She has missed a few doses since the last 2 or 3 weeks and will wait till next visit to be checked  For now she can continue the  same regimen She can discuss her fatigue issues with the PCP along with problems with insomnia   PLAN:    Go up to 1 mg Ozempic weekly and she will double up on her doses If she has nausea she can reduce the dose to 0.75 mg weekly She can try taking [place]iba once a day instead of twice a day Go down to 36 or 38 units of Tresiba daily if morning sugars are below 90 More regular glucose monitoring after meals and bring monitor for download are reviewed  Follow-up in 2 months Encouraged her to increase her upper body exercises  Blood pressure and lipids to be followed by cardiologist  There are no Patient Instructions on file for this visit.      Reather LittlerAjay Nilda Keathley  10/29/2020, 10:20 AM   Note: This office note was prepared with Insurance underwriter. Any transcriptional errors that result from this process are unintentional.

## 2020-10-29 NOTE — Patient Instructions (Addendum)
Check blood sugars on waking up 3 days a week  Also check blood sugars about 2 hours after meals and do this after different meals by rotation  Recommended blood sugar levels on waking up are 90-130 and about 2 hours after meal is 130-160  Please bring your blood sugar monitor to each visit, thank you  Ozempic 2 shots at same time of 0.5

## 2020-10-29 NOTE — Progress Notes (Signed)
Established Patient Office Visit  Subjective:  Patient ID: Amy Lester, female    DOB: 12-13-1970  Age: 50 y.o. MRN: [id number]  CC:  Chief Complaint  Patient presents with  . Arm Pain    Patient states pain has been ongoing for the past x2 weeks. Patient was seen at an Urgent care center and was given Doxcycline. Patient states pain is radiating into right breast with tenderness and pain.     HPI [name] presents for onset roughly 2 weeks ago of right axillary pain.  She denies any injury.  She went to urgent care in [place] and was placed on doxycycline which has not helped.  If anything, she thinks her pain is worse.  She has not noted any obvious visible swelling or erythema.  She thinks she had a pea-sized nodule at the beginning of this but has not felt recently.  She has tried Tylenol and ibuprofen with minimal relief.  No recent fevers or chills.  No skin rashes.  No right upper extremity numbness or weakness.  No neck pain.  She is not noted any recent breast masses.  Past Medical History:  Diagnosis Date  . Asthma   . Blood transfusion without reported diagnosis   . Chronic migraine   . Common migraine with intractable migraine 02/16/2019  . Diabetes mellitus without complication (HCC)   . Endometriosis   . Fatty liver   . GERD (gastroesophageal reflux disease)   . Hyperlipidemia   . Hypertension   . Hypothyroidism   . Incontinence   . Irregular heartbeat   . Multiple sclerosis (HCC)   . Palpitations   . Postconcussive syndrome 05/09/2020  . Pre-diabetes   . Sleep apnea     Past Surgical History:  Procedure Laterality Date  . ABDOMINAL HYSTERECTOMY    . CESAREAN SECTION     x1  . CHOLECYSTECTOMY    . ENDOMETRIAL ABLATION    . FOOT SURGERY Left 06/2018   foot reconstruction  . REPLACEMENT TOTAL KNEE Left     Family History  Problem Relation Age of Onset  . Heart attack Mother   . Stroke Mother   . Cancer Mother        breast cancer  .  Hyperlipidemia Mother   . Hypothyroidism Mother   . Cancer Father        lymph nodes, liver cancer  . Colon cancer Father        early 69s  . Other Daughter        Fine Gold Type II Syndrome and cognetive issues  . Memory loss Daughter   . Other Son        Fine Gold Syndrome and BPES  . Diabetes Son 22       IDDM  . Hypothyroidism Sister   . Cancer Sister   . Hyperthyroidism Neg Hx     Social History   Socioeconomic History  . Marital status: Married    Spouse name: Not on file  . Number of children: Not on file  . Years of education: 39  . Highest education level: Not on file  Occupational History  . Not on file  Tobacco Use  . Smoking status: Never Smoker  . Smokeless tobacco: Never Used  Vaping Use  . Vaping Use: Never used  Substance and Sexual Activity  . Alcohol use: Yes    Comment: once a year  . Drug use: Never  . Sexual activity: Not on file  Other Topics Concern  . Not on file  Social History Narrative   Left handed    Caffeine 1 daily    Lives at home with husband and adult daughter   Social Determinants of Health   Financial Resource Strain: Not on file  Food Insecurity: Not on file  Transportation Needs: Not on file  Physical Activity: Not on file  Stress: Not on file  Social Connections: Not on file  Intimate Partner Violence: Not on file    Outpatient Medications Prior to Visit  Medication Sig Dispense Refill  . albuterol (VENTOLIN HFA) 108 (90 Base) MCG/ACT inhaler Inhale 3 puffs into the lungs every 6 (six) hours as needed for wheezing or shortness of breath. 3 g 1  . cetirizine (ZYRTEC) 10 MG tablet Take 1 tablet (10 mg total) by mouth daily. 90 tablet 0  . clotrimazole-betamethasone (LOTRISONE) cream Apply 1 application topically as needed. (Patient taking differently: Apply 1 application topically as needed (Outbreak).) 45 g 2  . Cyanocobalamin (B-12 PO) Take by mouth daily.    Marland Kitchen doxycycline (VIBRAMYCIN) 100 MG capsule Take 1 capsule  (100 mg total) by mouth 2 (two) times daily. 20 capsule 0  . Erenumab-aooe (AIMOVIG, 140 MG DOSE,) 70 MG/ML SOAJ Inject 140 mg into the skin every 30 (thirty) days. 1.12 mL 5  . erythromycin ophthalmic ointment Place 1 application into both eyes at bedtime.    Marland Kitchen estradiol (EVAMIST) 1.53 MG/SPRAY transdermal spray Place 3 sprays onto the skin daily.    Marland Kitchen ezetimibe (ZETIA) 10 MG tablet Take 10 mg by mouth daily.     Marland Kitchen glipiZIDE (GLUCOTROL) 10 MG tablet Take 10 mg by mouth daily.     . insulin degludec (TRESIBA FLEXTOUCH) 100 UNIT/ML FlexTouch Pen Inject 20 units under the skin twice daily. 15 mL 0  . lisinopril (ZESTRIL) 5 MG tablet Take 1 tablet (5 mg total) by mouth daily. 90 tablet 1  . MAGNESIUM OXIDE PO Take by mouth.    . metFORMIN (GLUCOPHAGE) 1000 MG tablet Take 1,000 mg by mouth 2 (two) times daily.    . metoprolol tartrate (LOPRESSOR) 25 MG tablet Take 25 mg by mouth in the morning.    . metoprolol tartrate (LOPRESSOR) 50 MG tablet Take 50 mg by mouth at bedtime.    . metroNIDAZOLE (METROGEL) 1 % gel Apply 1 application topically at bedtime.     . montelukast (SINGULAIR) 10 MG tablet Take 10 mg by mouth every morning.    . Multiple Vitamins-Minerals (MULTIVITAMIN WITH MINERALS) tablet Take 1 tablet by mouth daily.    Marland Kitchen nystatin cream (MYCOSTATIN) Apply 1 application topically daily as needed for dry skin.    Marland Kitchen Olopatadine HCl (PAZEO OP) Apply to eye daily.    Marland Kitchen omeprazole (PRILOSEC) 40 MG capsule Take 1 capsule (40 mg total) by mouth 2 (two) times daily before a meal. (Patient taking differently: Take 40 mg by mouth daily.) 60 capsule 3  . ondansetron (ZOFRAN) 8 MG tablet Take 1 tablet (8 mg total) by mouth every 8 (eight) hours as needed for nausea or vomiting. 20 tablet 1  . Red Yeast Rice Extract (RED YEAST RICE PO) Take 1 tablet by mouth daily.     . Riboflavin (VITAMIN B-2 PO) Take 1 tablet by mouth daily.     . rizatriptan (MAXALT) 10 MG tablet Take 1 tablet (10 mg total) by mouth  3 (three) times daily as needed for migraine. 10 tablet 5  . Semaglutide,0.25 or 0.5MG /DOS, (OZEMPIC,  0.25 OR 0.5 MG/DOSE,) 2 MG/1.5ML SOPN Inject 1 mg into the skin once a week.     Marland Kitchen SYNTHROID 125 MCG tablet Take two tablets (250mg  total) by mouth days 1-6 and skip day 7. (Patient taking differently: 225mg  by mouth days 1-6 and skip day 7.) 180 tablet 1  . TRETINOIN EX Apply topically daily.    Marland Kitchen triamcinolone cream (KENALOG) 0.1 % Apply 1 application topically 2 (two) times daily as needed. (Patient taking differently: Apply 1 application topically 2 (two) times daily as needed (rash).) 45 g 0  . triamterene-hydrochlorothiazide (MAXZIDE-25) 37.5-25 MG tablet Take 1 tablet by mouth daily. 90 tablet 1  . venlafaxine XR (EFFEXOR XR) 75 MG 24 hr capsule Take 1 capsule (75 mg total) by mouth daily with breakfast. 30 capsule 5  . Cyanocobalamin (VITAMIN B12 PO) Take by mouth every 30 (thirty) days. injection    . ondansetron (ZOFRAN ODT) 4 MG disintegrating tablet Take 1 tablet (4 mg total) by mouth every 8 (eight) hours as needed for nausea or vomiting. 20 tablet 0   No facility-administered medications prior to visit.    Allergies  Allergen Reactions  . Crestor [Rosuvastatin]     myalgia  . Lipitor [Atorvastatin]     myalgia  . Percocet [Oxycodone-Acetaminophen] Nausea And Vomiting  . Zocor [Simvastatin]     myalgia    ROS Review of Systems  Constitutional: Negative for chills and fever.  Cardiovascular: Negative for chest pain.  Hematological: Negative for adenopathy.      Objective:    Physical Exam Vitals reviewed.  Constitutional:      Appearance: Normal appearance.  Cardiovascular:     Rate and Rhythm: Normal rate and regular rhythm.  Pulmonary:     Comments: Right breast reveals no nipple inversion.  No skin dimpling.  No breast masses.  No breast tenderness.  Right axillary region reveals tenderness along the mid axillary region over area about 3 x 4 cm.  No overlying  erythema.  No palpable masses.  No palpable adenopathy. Neurological:     Mental Status: She is alert.     BP (!) 118/92 (BP Location: Left Arm, Patient Position: Sitting, Cuff Size: Large)   Pulse 91   Temp 97.7 F (36.5 C) (Oral)   Ht 5\' 4"  (1.626 m)   Wt 221 lb (100.2 kg)   SpO2 99%   BMI 37.93 kg/m  Wt Readings from Last 3 Encounters:  10/29/20 221 lb (100.2 kg)  09/11/20 225 lb 3.2 oz (102.2 kg)  08/27/20 223 lb 6.4 oz (101.3 kg)     Health Maintenance Due  Topic Date Due  . PNEUMOCOCCAL POLYSACCHARIDE VACCINE AGE 15-64 HIGH RISK  Never done  . COVID-19 Vaccine (1) Never done  . HIV Screening  Never done  . TETANUS/TDAP  Never done  . PAP SMEAR-Modifier  Never done  . COLONOSCOPY (Pts 45-27yrs Insurance coverage will need to be confirmed)  Never done  . MAMMOGRAM  Never done  . INFLUENZA VACCINE  Never done  . FOOT EXAM  07/23/2020    There are no preventive care reminders to display for this patient.  Lab Results  Component Value Date   TSH 1.89 07/15/2020   Lab Results  Component Value Date   WBC 7.7 07/24/2020   HGB 13.7 07/24/2020   HCT 43.3 07/24/2020   MCV 92.7 07/24/2020   PLT 358 07/24/2020   Lab Results  Component Value Date   NA 140 07/24/2020   K  4.3 07/24/2020   CO2 25 07/24/2020   GLUCOSE 102 (H) 07/24/2020   BUN 15 07/24/2020   CREATININE 0.77 07/24/2020   BILITOT 0.3 08/27/2020   ALKPHOS 140 (H) 08/27/2020   AST 23 08/27/2020   ALT 31 08/27/2020   PROT 7.9 08/27/2020   ALBUMIN 4.4 08/27/2020   CALCIUM 8.9 07/24/2020   ANIONGAP 10 07/24/2020   GFR 90.03 07/24/2019   Lab Results  Component Value Date   CHOL 244 (H) 08/27/2020   Lab Results  Component Value Date   HDL 48 08/27/2020   Lab Results  Component Value Date   LDLCALC 159 (H) 08/27/2020   Lab Results  Component Value Date   TRIG 204 (H) 08/27/2020   Lab Results  Component Value Date   CHOLHDL 5.1 (H) 08/27/2020   Lab Results  Component Value Date    HGBA1C 6.1 (A) 07/15/2020      Assessment & Plan:   Problem List Items Addressed This Visit   None   Visit Diagnoses    Axillary pain, right    -  Primary    Patient relates over 2-week history of fairly intense right axillary pain with unremarkable physical exam.  She was placed empirically on doxycycline couple weeks ago without improvement.  Do not see evidence for axillary abscess, adenopathy, or hidradenitis changes.  -We discussed setting up axillary ultrasound to further assess if she can tolerate    No orders of the defined types were placed in this encounter.   Follow-up: No follow-ups on file.    Evelena Peat, MD

## 2020-10-29 NOTE — Patient Instructions (Signed)
We are setting up right axillary ultrasound to further assess.  Watch for any changes such as fever, overlying redness, or any palpable swelling.

## 2020-10-30 NOTE — Patient Instructions (Signed)
Amy Lester  10/30/2020     @PREFPERIOPPHARMACY @   Your procedure is scheduled on  11/04/2020   Report to [facility] at  1100  A.M.   Call this number if you have problems the morning of surgery:  [phone]   Remember:  Follow the diet and prep instructions given to you by the office.                       Take these medicines the morning of surgery with A SIP OF WATER  Zyrtec, metoprolol, prilosec, zofran (if needed), synthroid, effexor.            Use your inhaler before you come and bring your rescue inhaler with you.           Take 10 units os tresiba the night before your procedure.    DO NOT take any medications for diabetes the morning of your procedure.          If your glucose is 70 or below the morning of your procedure, drink 1/2 cup of clear juice and recheck your glucose in 15 minutes. If your glucose is still 70 or below, call [phone] for instructions.         If your glucose is over 300 the morning of your procedure, call [phone].    Please brush your teeth.  Do not wear jewelry, make-up or nail polish.  Do not wear lotions, powders, or perfumes, or deodorant.  Do not shave 48 hours prior to surgery.  Men may shave face and neck.  Do not bring valuables to the hospital.  [facility] is not responsible for any belongings or valuables.   Contacts, dentures or bridgework may not be worn into surgery.  Leave your suitcase in the car.  After surgery it may be brought to your room.  For patients admitted to the hospital, discharge time will be determined by your treatment team.  Patients discharged the day of surgery will not be allowed to drive home and must have someone with them for 24 hours.   Special instructions:  DO NOT smoke tobacco or vape the morning of your procedure.   Please read over the following fact sheets that you were given. Anesthesia Post-op Instructions and Care and Recovery After Surgery       Upper  Endoscopy, Adult, Care After This sheet gives you information about how to care for yourself after your procedure. Your health care provider may also give you more specific instructions. If you have problems or questions, contact your health care provider. What can I expect after the procedure? After the procedure, it is common to have:  A sore throat.  Mild stomach pain or discomfort.  Bloating.  Nausea. Follow these instructions at home:  Follow instructions from your health care provider about what to eat or drink after your procedure.  Return to your normal activities as told by your health care provider. Ask your health care provider what activities are safe for you.  Take over-the-counter and prescription medicines only as told by your health care provider.  If you were given a sedative during the procedure, it can affect you for several hours. Do not drive or operate machinery until your health care provider says that it is safe.  Keep all follow-up visits as told by your health care provider. This is important.   Contact a health care provider if  you have:  A sore throat that lasts longer than one day.  Trouble swallowing. Get help right away if:  You vomit blood or your vomit looks like coffee grounds.  You have: ? A fever. ? Bloody, black, or tarry stools. ? A severe sore throat or you cannot swallow. ? Difficulty breathing. ? Severe pain in your chest or abdomen. Summary  After the procedure, it is common to have a sore throat, mild stomach discomfort, bloating, and nausea.  If you were given a sedative during the procedure, it can affect you for several hours. Do not drive or operate machinery until your health care provider says that it is safe.  Follow instructions from your health care provider about what to eat or drink after your procedure.  Return to your normal activities as told by your health care provider. This information is not intended to  replace advice given to you by your health care provider. Make sure you discuss any questions you have with your health care provider. Document Revised: 08/22/2019 Document Reviewed: 01/24/2018 Elsevier Patient Education  2021 [employer] Inc[employer]  Colonoscopy, Adult, Care After This sheet gives you information about how to care for yourself after your procedure. Your health care provider may also give you more specific instructions. If you have problems or questions, contact your health care provider. What can I expect after the procedure? After the procedure, it is common to have:  A small amount of blood in your stool for 24 hours after the procedure.  Some gas.  Mild cramping or bloating of your abdomen. Follow these instructions at home: Eating and drinking  Drink enough fluid to keep your urine pale yellow.  Follow instructions from your health care provider about eating or drinking restrictions.  Resume your normal diet as instructed by your health care provider. Avoid heavy or fried foods that are hard to digest.   Activity  Rest as told by your health care provider.  Avoid sitting for a long time without moving. Get up to take short walks every 1-2 hours. This is important to improve blood flow and breathing. Ask for help if you feel weak or unsteady.  Return to your normal activities as told by your health care provider. Ask your health care provider what activities are safe for you. Managing cramping and bloating  Try walking around when you have cramps or feel bloated.  Apply heat to your abdomen as told by your health care provider. Use the heat source that your health care provider recommends, such as a moist heat pack or a heating pad. ? Place a towel between your skin and the heat source. ? Leave the heat on for 20-30 minutes. ? Remove the heat if your skin turns bright red. This is especially important if you are unable to feel pain, heat, or cold. You may have a  greater risk of getting burned.   General instructions  If you were given a sedative during the procedure, it can affect you for several hours. Do not drive or operate machinery until your health care provider says that it is safe.  For the first 24 hours after the procedure: ? Do not sign important documents. ? Do not drink alcohol. ? Do your regular daily activities at a slower pace than normal. ? Eat soft foods that are easy to digest.  Take over-the-counter and prescription medicines only as told by your health care provider.  Keep all follow-up visits as told by your health care provider.  This is important. Contact a health care provider if:  You have blood in your stool 2-3 days after the procedure. Get help right away if you have:  More than a small spotting of blood in your stool.  Large blood clots in your stool.  Swelling of your abdomen.  Nausea or vomiting.  A fever.  Increasing pain in your abdomen that is not relieved with medicine. Summary  After the procedure, it is common to have a small amount of blood in your stool. You may also have mild cramping and bloating of your abdomen.  If you were given a sedative during the procedure, it can affect you for several hours. Do not drive or operate machinery until your health care provider says that it is safe.  Get help right away if you have a lot of blood in your stool, nausea or vomiting, a fever, or increased pain in your abdomen. This information is not intended to replace advice given to you by your health care provider. Make sure you discuss any questions you have with your health care provider. Document Revised: 08/18/2019 Document Reviewed: 03/20/2019 Elsevier Patient Education  2021 Else[employer] Inc. Monitored Anesthesia Care, Care After This sheet gives you information about how to care for yourself after your procedure. Your health care provider may also give you more specific instructions. If you have  problems or questions, contact your health care provider. What can I expect after the procedure? After the procedure, it is common to have:  Tiredness.  Forgetfulness about what happened after the procedure.  Impaired judgment for important decisions.  Nausea or vomiting.  Some difficulty with balance. Follow these instructions at home: For the time period you were told by your health care provider:  Rest as needed.  Do not participate in activities where you could fall or become injured.  Do not drive or use machinery.  Do not drink alcohol.  Do not take sleeping pills or medicines that cause drowsiness.  Do not make important decisions or sign legal documents.  Do not take care of children on your own.      Eating and drinking  Follow the diet that is recommended by your health care provider.  Drink enough fluid to keep your urine pale yellow.  If you vomit: ? Drink water, juice, or soup when you can drink without vomiting. ? Make sure you have little or no nausea before eating solid foods. General instructions  Have a responsible adult stay with you for the time you are told. It is important to have someone help care for you until you are awake and alert.  Take over-the-counter and prescription medicines only as told by your health care provider.  If you have sleep apnea, surgery and certain medicines can increase your risk for breathing problems. Follow instructions from your health care provider about wearing your sleep device: ? Anytime you are sleeping, including during daytime naps. ? While taking prescription pain medicines, sleeping medicines, or medicines that make you drowsy.  Avoid smoking.  Keep all follow-up visits as told by your health care provider. This is important. Contact a health care provider if:  You keep feeling nauseous or you keep vomiting.  You feel light-headed.  You are still sleepy or having trouble with balance after 24  hours.  You develop a rash.  You have a fever.  You have redness or swelling around the IV site. Get help right away if:  You have trouble breathing.  You have  new-onset confusion at home. Summary  For several hours after your procedure, you may feel tired. You may also be forgetful and have poor judgment.  Have a responsible adult stay with you for the time you are told. It is important to have someone help care for you until you are awake and alert.  Rest as told. Do not drive or operate machinery. Do not drink alcohol or take sleeping pills.  Get help right away if you have trouble breathing, or if you suddenly become confused. This information is not intended to replace advice given to you by your health care provider. Make sure you discuss any questions you have with your health care provider. Document Revised: 05/09/2020 Document Reviewed: 07/27/2019 Elsevier Patient Education  2021 [employer].

## 2020-10-31 ENCOUNTER — Telehealth: Payer: Self-pay | Admitting: Internal Medicine

## 2020-10-31 ENCOUNTER — Encounter (HOSPITAL_COMMUNITY)
Admission: RE | Admit: 2020-10-31 | Discharge: 2020-10-31 | Disposition: A | Discharge status: Home / Self Care | Payer: Medicaid Other | Source: Ambulatory Visit | Attending: Internal Medicine | Admitting: Internal Medicine

## 2020-10-31 ENCOUNTER — Ambulatory Visit: Payer: Self-pay | Admitting: Cardiology

## 2020-10-31 ENCOUNTER — Other Ambulatory Visit: Payer: Self-pay

## 2020-10-31 ENCOUNTER — Encounter (HOSPITAL_COMMUNITY): Payer: Self-pay

## 2020-10-31 ENCOUNTER — Other Ambulatory Visit (HOSPITAL_COMMUNITY)
Admission: RE | Admit: 2020-10-31 | Discharge: 2020-10-31 | Disposition: A | Discharge status: Home / Self Care | Payer: Medicaid Other | Source: Ambulatory Visit | Attending: Internal Medicine | Admitting: Internal Medicine

## 2020-10-31 DIAGNOSIS — Z01812 Encounter for preprocedural laboratory examination: Secondary | ICD-10-CM | POA: Insufficient documentation

## 2020-10-31 DIAGNOSIS — Z20822 Contact with and (suspected) exposure to covid-19: Secondary | ICD-10-CM | POA: Insufficient documentation

## 2020-10-31 LAB — BASIC METABOLIC PANEL
Anion gap: 8 (ref 5–15)
BUN: 12 mg/dL (ref 6–20)
CO2: 27 mmol/L (ref 22–32)
Calcium: 9.2 mg/dL (ref 8.9–10.3)
Chloride: 102 mmol/L (ref 98–111)
Creatinine, Ser: 0.97 mg/dL (ref 0.44–1.00)
GFR, Estimated: >60 mL/min (ref >60)
Glucose, Bld: 170 mg/dL — ABNORMAL HIGH (ref 70–99)
Potassium: 3.6 mmol/L (ref 3.5–5.1)
Sodium: 137 mmol/L (ref 135–145)

## 2020-10-31 NOTE — Telephone Encounter (Signed)
[facility] covid site called to say that patient was refusing her covid test and is scheduled procedure with Dr Jena Gauss on 11/04/2020. She wanted to know if she could still have procedure without having the test done. I told her if she didn't take the covid test the procedure would have to be cancelled. Patient said she would take the test, but has questions about her prep instructions. I told the nurse that the scheduler could call patient and go over that with her. [phone]

## 2020-10-31 NOTE — Telephone Encounter (Signed)
LMOVM for pt 

## 2020-11-01 LAB — SARS CORONAVIRUS 2 (TAT 6-24 HRS): SARS Coronavirus 2: NEGATIVE

## 2020-11-01 NOTE — Telephone Encounter (Signed)
LMOVM for pt to call back 

## 2020-11-04 ENCOUNTER — Ambulatory Visit (HOSPITAL_COMMUNITY): Payer: Self-pay | Admitting: Anesthesiology

## 2020-11-04 ENCOUNTER — Encounter (HOSPITAL_COMMUNITY): Payer: Self-pay | Admitting: Internal Medicine

## 2020-11-04 ENCOUNTER — Encounter (HOSPITAL_COMMUNITY)
Admission: RE | Disposition: A | Discharge status: Home / Self Care | Payer: Self-pay | Source: Home / Self Care | Attending: Internal Medicine

## 2020-11-04 ENCOUNTER — Ambulatory Visit (HOSPITAL_COMMUNITY)
Admission: RE | Admit: 2020-11-04 | Discharge: 2020-11-04 | Disposition: A | Discharge status: Home / Self Care | Payer: Self-pay | Attending: Internal Medicine | Admitting: Internal Medicine

## 2020-11-04 ENCOUNTER — Other Ambulatory Visit: Payer: Self-pay

## 2020-11-04 DIAGNOSIS — R1084 Generalized abdominal pain: Secondary | ICD-10-CM

## 2020-11-04 DIAGNOSIS — Z800 Family history of malignant neoplasm of digestive organs: Secondary | ICD-10-CM

## 2020-11-04 DIAGNOSIS — Z1211 Encounter for screening for malignant neoplasm of colon: Secondary | ICD-10-CM

## 2020-11-04 HISTORY — PX: ESOPHAGOGASTRODUODENOSCOPY (EGD) WITH PROPOFOL: SHX5813

## 2020-11-04 HISTORY — PX: COLONOSCOPY WITH PROPOFOL: SHX5780

## 2020-11-04 LAB — GLUCOSE, CAPILLARY
Glucose-Capillary: 104 mg/dL — ABNORMAL HIGH (ref 70–99)
Glucose-Capillary: 86 mg/dL (ref 70–99)

## 2020-11-04 SURGERY — COLONOSCOPY WITH PROPOFOL
Anesthesia: General

## 2020-11-04 MED ORDER — LIDOCAINE VISCOUS HCL 2 % MT SOLN
OROMUCOSAL | Status: AC
  Filled 2020-11-04: qty 15

## 2020-11-04 MED ORDER — MIDAZOLAM HCL 2 MG/2ML IJ SOLN
INTRAMUSCULAR | Status: DC | PRN
  Administered 2020-11-04: 2 mg via INTRAVENOUS

## 2020-11-04 MED ORDER — PHENYLEPHRINE 40 MCG/ML (10ML) SYRINGE FOR IV PUSH (FOR BLOOD PRESSURE SUPPORT)
PREFILLED_SYRINGE | INTRAVENOUS | Status: AC
  Filled 2020-11-04: qty 40

## 2020-11-04 MED ORDER — PROPOFOL 500 MG/50ML IV EMUL
INTRAVENOUS | Status: DC | PRN
  Administered 2020-11-04: 150 ug/kg/min via INTRAVENOUS

## 2020-11-04 MED ORDER — PROPOFOL 10 MG/ML IV BOLUS
INTRAVENOUS | Status: DC | PRN
  Administered 2020-11-04: 60 mg via INTRAVENOUS

## 2020-11-04 MED ORDER — LACTATED RINGERS IV SOLN: INTRAVENOUS | Status: DC

## 2020-11-04 MED ORDER — LIDOCAINE VISCOUS HCL 2 % MT SOLN
15.0000 mL | Freq: Once | OROMUCOSAL | Status: AC
  Administered 2020-11-04: 15 mL via OROMUCOSAL

## 2020-11-04 MED ORDER — PROPOFOL 10 MG/ML IV BOLUS
INTRAVENOUS | Status: AC
  Filled 2020-11-04: qty 20

## 2020-11-04 MED ORDER — MIDAZOLAM HCL 2 MG/2ML IJ SOLN
INTRAMUSCULAR | Status: AC
  Filled 2020-11-04: qty 2

## 2020-11-04 NOTE — Op Note (Signed)
[facility] Patient Name: Amy BilisRuthann Lester Procedure Date: 11/04/2020 1:13 PM MRN: [id number] Date of Birth: 08-Aug-1971 Attending MD: Gennette Pacobert Michael Kenichi Cassada , MD CSN: [id number] Age: 50 Admit Type: Outpatient Procedure:                Colonoscopy Indications:              Screening in patient at increased risk: Family                            history of 1st-degree relative with colorectal                            cancer before age 52 years Providers:                Gennette Pacobert Michael Adhvik Canady, MD, Brain HiltsLurae Albert, RN, Dyann Ruddleonya                            Wilson Referring MD:              Medicines:                Propofol per Anesthesia Complications:            No immediate complications. Estimated Blood Loss:     Estimated blood loss: none. Procedure:                Pre-Anesthesia Assessment:                           - Prior to the procedure, a History and Physical                            was performed, and patient medications and                            allergies were reviewed. The patient's tolerance of                            previous anesthesia was also reviewed. The risks                            and benefits of the procedure and the sedation                            options and risks were discussed with the patient.                            All questions were answered, and informed consent                            was obtained. Prior Anticoagulants: The patient has                            taken no previous anticoagulant or antiplatelet                            agents.  ASA Grade Assessment: III - A patient with                            severe systemic disease. After reviewing the risks                            and benefits, the patient was deemed in                            satisfactory condition to undergo the procedure.                           After obtaining informed consent, the colonoscope                            was passed under direct vision.  Throughout the                            procedure, the patient's blood pressure, pulse, and                            oxygen saturations were monitored continuously. The                            [id number]-[id number] ([id number]) scope was introduced through                            the anus and advanced to the the cecum, identified                            by appendiceal orifice and ileocecal valve. The                            colonoscopy was performed without difficulty. The                            patient tolerated the procedure well. The quality                            of the bowel preparation was adequate. Scope In: 1:16:03 PM Scope Out: 1:30:00 PM Scope Withdrawal Time: 0 hours 9 minutes 45 seconds  Total Procedure Duration: 0 hours 13 minutes 57 seconds  Findings:      The perianal and digital rectal examinations were normal.      The colon (entire examined portion) appeared normal.      The retroflexed view of the distal rectum and anal verge was normal and       showed no anal or rectal abnormalities. Impression:               - The entire examined colon is normal.                           - The distal rectum and anal verge are normal on  retroflexion view.                           - No specimens collected. Moderate Sedation:      Moderate (conscious) sedation was personally administered by an       anesthesia professional. The following parameters were monitored: oxygen       saturation, heart rate, blood pressure, respiratory rate, EKG, adequacy       of pulmonary ventilation, and response to care. Recommendation:           - Patient has a contact number available for                            emergencies. The signs and symptoms of potential                            delayed complications were discussed with the                            patient. Return to normal activities tomorrow.                            Written discharge instructions  were provided to the                            patient.                           - Resume previous diet.                           - Continue present medications.                           - Repeat colonoscopy in 5 years for screening                            purposes.                           - Return to GI office in 3 weeks. See EGD report Procedure Code(s):        --- Professional ---                           [id number], Colonoscopy, flexible; diagnostic, including                            collection of specimen(s) by brushing or washing,                            when performed (separate procedure) Diagnosis Code(s):        --- Professional ---                           Z80.0, Family history of malignant neoplasm of  digestive organs CPT copyright 2019 American Medical Association. All rights reserved. The codes documented in this report are preliminary and upon coder review may  be revised to meet current compliance requirements. Amy Lester. Amy Hallinan, MD Gennette Pac, MD 11/04/2020 1:33:00 PM This report has been signed electronically. Number of Addenda: 0

## 2020-11-04 NOTE — Anesthesia Preprocedure Evaluation (Addendum)
Anesthesia Evaluation  Patient identified by MRN, date of birth, ID band Patient awake    Reviewed: Allergy & Precautions, NPO status , Patient's Chart, lab work & pertinent test results, reviewed documented beta blocker date and time   Airway Mallampati: III  TM Distance: >3 FB Neck ROM: Full    Dental  (+) Dental Advisory Given, Teeth Intact   Pulmonary shortness of breath and with exertion, asthma , sleep apnea (severe) and Continuous Positive Airway Pressure Ventilation ,    Pulmonary exam normal breath sounds clear to auscultation       Cardiovascular Exercise Tolerance: Good hypertension, Pt. on medications and Pt. on home beta blockers + DOE  Normal cardiovascular exam+ dysrhythmias  Rhythm:Regular Rate:Normal     Neuro/Psych  Headaches, PSYCHIATRIC DISORDERS Dementia Post concussion syndrome   Neuromuscular disease (multiple sclerosis )    GI/Hepatic GERD  Medicated and Controlled,Elevated LFTs   Endo/Other  diabetes, Well Controlled, Type 2Hypothyroidism   Renal/GU negative Renal ROS     Musculoskeletal   Abdominal   Peds  Hematology   Anesthesia Other Findings   Reproductive/Obstetrics                            Anesthesia Physical Anesthesia Plan  ASA: III  Anesthesia Plan: General   Post-op Pain Management:    Induction: Intravenous  PONV Risk Score and Plan: TIVA  Airway Management Planned: Nasal Cannula and Natural Airway  Additional Equipment:   Intra-op Plan:   Post-operative Plan:   Informed Consent: I have reviewed the patients History and Physical, chart, labs and discussed the procedure including the risks, benefits and alternatives for the proposed anesthesia with the patient or authorized representative who has indicated his/her understanding and acceptance.     Dental advisory given  Plan Discussed with: CRNA and Surgeon  Anesthesia Plan Comments:          Anesthesia Quick Evaluation

## 2020-11-04 NOTE — Transfer of Care (Signed)
Immediate Anesthesia Transfer of Care Note  Patient: Amy Lester  Procedure(s) Performed: COLONOSCOPY WITH PROPOFOL (N/A ) ESOPHAGOGASTRODUODENOSCOPY (EGD) WITH PROPOFOL (N/A )  Patient Location: PACU  Anesthesia Type:General  Level of Consciousness: awake, alert , oriented and patient cooperative  Airway & Oxygen Therapy: Patient Spontanous Breathing  Post-op Assessment: Report given to RN, Post -op Vital signs reviewed and stable and Patient moving all extremities X 4  Post vital signs: Reviewed and stable  Last Vitals:  Vitals Value Taken Time  BP    Temp    Pulse    Resp    SpO2      Last Pain:  Vitals:   11/04/20 1305  TempSrc:   PainSc: 0-No pain         Complications: No complications documented.

## 2020-11-04 NOTE — Op Note (Signed)
[facility] Patient Name: Amy Lester Procedure Date: 11/04/2020 12:03 PM MRN: [id number] Date of Birth: [birth year] Attending MD: Gennette Pac , MD CSN: [id number] Age: 50 Admit Type: Outpatient Procedure:                Upper GI endoscopy Indications:              Generalized abdominal pain Providers:                Gennette Pac, MD, Brain Hilts, RN, Dyann Ruddle Referring MD:              Medicines:                Propofol per Anesthesia Complications:            No immediate complications. Estimated Blood Loss:     Estimated blood loss: none. Procedure:                Pre-Anesthesia Assessment:                           - Prior to the procedure, a History and Physical                            was performed, and patient medications and                            allergies were reviewed. The patient's tolerance of                            previous anesthesia was also reviewed. The risks                            and benefits of the procedure and the sedation                            options and risks were discussed with the patient.                            All questions were answered, and informed consent                            was obtained. Prior Anticoagulants: The patient has                            taken no previous anticoagulant or antiplatelet                            agents. ASA Grade Assessment: III - A patient with                            severe systemic disease. After reviewing the risks  and benefits, the patient was deemed in                            satisfactory condition to undergo the procedure.                           After obtaining informed consent, the endoscope was                            passed under direct vision. Throughout the                            procedure, the patient's blood pressure, pulse, and                            oxygen saturations were  monitored continuously. The                            [id number]-[id number] ([id number]) scope was introduced through the                            mouth, and advanced to the third part of duodenum.                            The upper GI endoscopy was accomplished without                            difficulty. The patient tolerated the procedure                            well. Scope In: 1:08:32 PM Scope Out: 1:12:18 PM Total Procedure Duration: 0 hours 3 minutes 46 seconds  Findings:      The examined esophagus was normal.      The entire examined stomach was normal.      The duodenal bulb, second portion of the duodenum and third portion of       the duodenum were normal. Impression:               - Normal esophagus.                           - Normal stomach.                           - Normal duodenal bulb, second portion of the                            duodenum and third portion of the duodenum.                           - No specimens collected. Moderate Sedation:      Moderate (conscious) sedation was personally administered by an       anesthesia professional. The following parameters were monitored: oxygen       saturation, heart rate, blood pressure, respiratory rate, EKG, adequacy  of pulmonary ventilation, and response to care. Recommendation:           - Patient has a contact number available for                            emergencies. The signs and symptoms of potential                            delayed complications were discussed with the                            patient. Return to normal activities tomorrow.                            Written discharge instructions were provided to the                            patient.                           - Resume previous diet.                           - Continue present medications.                           - Return to my office in 3 weeks. See colonoscopy                            report. Procedure Code(s):        ---  Professional ---                           [phone], Esophagogastroduodenoscopy, flexible,                            transoral; diagnostic, including collection of                            specimen(s) by brushing or washing, when performed                            (separate procedure) Diagnosis Code(s):        --- Professional ---                           R10.84, Generalized abdominal pain CPT copyright 2019 American Medical Association. All rights reserved. The codes documented in this report are preliminary and upon coder review may  be revised to meet current compliance requirements. Gerrit Friends. Ireoluwa Gorsline, MD Gennette Pac, MD 11/04/2020 1:14:59 PM This report has been signed electronically. Number of Addenda: 0

## 2020-11-04 NOTE — Anesthesia Postprocedure Evaluation (Signed)
Anesthesia Post Note  Patient: Amy Lester  Procedure(s) Performed: COLONOSCOPY WITH PROPOFOL (N/A ) ESOPHAGOGASTRODUODENOSCOPY (EGD) WITH PROPOFOL (N/A )  Patient location during evaluation: PACU Anesthesia Type: General Level of consciousness: awake, oriented, awake and alert and patient cooperative Pain management: pain level not controlled Vital Signs Assessment: post-procedure vital signs reviewed and stable Respiratory status: spontaneous breathing, respiratory function stable and nonlabored ventilation Cardiovascular status: stable Postop Assessment: no apparent nausea or vomiting Anesthetic complications: no   No complications documented.   Last Vitals:  Vitals:   11/04/20 1133  BP: 132/80  Pulse: 96  Resp: 17  Temp: 36.9 C  SpO2: 99%    Last Pain:  Vitals:   11/04/20 1305  TempSrc:   PainSc: 0-No pain                 Tydarius Yawn

## 2020-11-04 NOTE — H&P (Signed)
@LOGO @   Primary Care Physician:  , MD Primary Gastroenterologist:  Dr. Deeann Saint  Pre-Procedure History & Physical: HPI:  Amy Lester is a 50 y.o. female here for further evaluation of nonspecific abdominal pain and high rescreening colonoscopy given family history of first-degree relative at a young age.  CT abdomen pelvis since being seen in the office was unremarkable  Past Medical History:  Diagnosis Date  . Asthma   . Blood transfusion without reported diagnosis   . Chronic migraine   . Common migraine with intractable migraine 02/16/2019  . Diabetes mellitus without complication (HCC)   . Endometriosis   . Fatty liver   . GERD (gastroesophageal reflux disease)   . Hyperlipidemia   . Hypertension   . Hypothyroidism   . Incontinence   . Irregular heartbeat   . Multiple sclerosis (HCC)   . Palpitations   . Postconcussive syndrome 05/09/2020  . Pre-diabetes   . Sleep apnea     Past Surgical History:  Procedure Laterality Date  . ABDOMINAL HYSTERECTOMY    . CESAREAN SECTION     x1  . CHOLECYSTECTOMY    . ENDOMETRIAL ABLATION    . FOOT SURGERY Left 06/2018   foot reconstruction  . REPLACEMENT TOTAL KNEE Left     Prior to Admission medications   Medication Sig Start Date End Date Taking? Authorizing Provider  acetaminophen (TYLENOL) 500 MG tablet Take 500 mg by mouth every 6 (six) hours as needed for moderate pain.   Yes [provider]  albuterol (VENTOLIN HFA) 108 (90 Base) MCG/ACT inhaler Inhale 3 puffs into the lungs every 6 (six) hours as needed for wheezing or shortness of breath. Patient taking differently: Inhale 1-3 puffs into the lungs every 6 (six) hours as needed for wheezing or shortness of breath. 03/28/19  Yes 03/30/19, PA-C  calcium carbonate (TUMS - DOSED IN MG ELEMENTAL CALCIUM) 500 MG chewable tablet Chew 2-3 tablets by mouth daily as needed for indigestion or heartburn.   Yes [provider]  cetirizine  (ZYRTEC) 10 MG tablet Take 1 tablet (10 mg total) by mouth daily. 03/28/19  Yes 03/30/19, PA-C  clotrimazole-betamethasone (LOTRISONE) cream Apply 1 application topically as needed. Patient taking differently: Apply 1 application topically as needed (Outbreak). 04/12/19  Yes 06/12/19, MD  Cyanocobalamin (B-12 COMPLIANCE INJECTION IJ) Inject 1 Dose as directed every 30 (thirty) days.   Yes [provider]  doxycycline (VIBRAMYCIN) 100 MG capsule Take 1 capsule (100 mg total) by mouth 2 (two) times daily. 10/23/20  Yes Avegno, 10/25/20, FNP  Erenumab-aooe (AIMOVIG, 140 MG DOSE,) 70 MG/ML SOAJ Inject 140 mg into the skin every 30 (thirty) days. Patient taking differently: Inject 140 mg into the skin every 28 (twenty-eight) days. 05/09/20  Yes 07/09/20, MD  erythromycin ophthalmic ointment Place 1 application into both eyes at bedtime.   Yes [provider]  estradiol (EVAMIST) 1.53 MG/SPRAY transdermal spray Place 3 sprays onto the skin daily.   Yes [provider]  ezetimibe (ZETIA) 10 MG tablet Take 10 mg by mouth daily.    Yes [provider]  glipiZIDE (GLUCOTROL) 10 MG tablet Take 10 mg by mouth daily.  03/22/20  Yes [provider]  ibuprofen (ADVIL) 200 MG tablet Take 600-800 mg by mouth every 6 (six) hours as needed for mild pain or moderate pain.   Yes [provider]  insulin degludec (TRESIBA FLEXTOUCH) 100 UNIT/ML FlexTouch Pen Inject 20 units  under the skin twice daily. Patient taking differently: Inject 20 Units into the skin 2 (two) times daily. 05/10/20  Yes Reather Littler, MD  levothyroxine (SYNTHROID) 100 MCG tablet Take 100 mcg by mouth See admin instructions. Take with the to equal 225 mcg, 6 days per week. Skip Sundays   Yes [provider]  lisinopril (ZESTRIL) 5 MG tablet Take 1 tablet (5 mg total) by mouth daily. 08/21/19 07/30/21 Yes BranchDorothe Pea, MD  MAGNESIUM OXIDE PO Take 1 tablet by  mouth daily.   Yes [provider]  MELATONIN PO Take 1 capsule by mouth at bedtime as needed (sleep).   Yes [provider]  metFORMIN (GLUCOPHAGE) 1000 MG tablet Take 1,000 mg by mouth 2 (two) times daily. 03/19/20  Yes [provider]  metoprolol tartrate (LOPRESSOR) 25 MG tablet Take 25 mg by mouth in the morning.   Yes [provider]  metoprolol tartrate (LOPRESSOR) 50 MG tablet Take 50 mg by mouth at bedtime.   Yes [provider]  metroNIDAZOLE (METROGEL) 1 % gel Apply 1 application topically at bedtime.    Yes [provider]  montelukast (SINGULAIR) 10 MG tablet Take 10 mg by mouth every morning.   Yes [provider]  Multiple Vitamins-Minerals (MULTIVITAMIN WITH MINERALS) tablet Take 1 tablet by mouth daily.   Yes [provider]  nystatin cream (MYCOSTATIN) Apply 1 application topically daily as needed for dry skin.   Yes [provider]  Olopatadine HCl (PAZEO OP) Place 1 drop into both eyes daily.   Yes [provider]  omeprazole (PRILOSEC) 40 MG capsule Take 1 capsule (40 mg total) by mouth 2 (two) times daily before a meal. Patient taking differently: Take 40 mg by mouth daily. 07/25/20  Yes [name] S, PA-C  ondansetron (ZOFRAN) 4 MG tablet Take 4 mg by mouth every 8 (eight) hours as needed for nausea or vomiting.   Yes [provider]  ondansetron (ZOFRAN) 8 MG tablet Take 1 tablet (8 mg total) by mouth every 8 (eight) hours as needed for nausea or vomiting. 07/25/20  Yes Harper, Kristen S, PA-C  Red Yeast Rice Extract (RED YEAST RICE PO) Take 1 tablet by mouth daily.    Yes [provider]  Riboflavin (VITAMIN B-2 PO) Take 1 tablet by mouth daily.    Yes [provider]  rizatriptan (MAXALT) 10 MG tablet Take 1 tablet (10 mg total) by mouth 3 (three) times daily as needed for migraine. 09/11/20  Yes Glean Salvo, NP  Semaglutide, 1 MG/DOSE, (OZEMPIC, 1  MG/DOSE,) 2 MG/1.5ML SOPN Inject 1 mg into the skin once a week. Patient taking differently: Inject 1 mg into the skin every Sunday. 10/29/20  Yes Reather Littler, MD  SYNTHROID 125 MCG tablet Take two tablets (250mg  total) by mouth days 1-6 and skip day 7. Patient taking differently: Take 125 mcg by mouth See admin instructions. Take with 100 mcg to equal 225 mcg, 6 days per week. Skip Sundays 03/20/20  Yes 03/22/20, MD  Tretinoin, Facial Wrinkles, (RENOVA) 0.02 % CREA Apply 1 application topically at bedtime.   Yes [provider]  triamcinolone cream (KENALOG) 0.1 % Apply 1 application topically 2 (two) times daily as needed. Patient taking differently: Apply 1 application topically 2 (two) times daily as needed (rash). 03/28/19  Yes 03/30/19, PA-C  triamterene-hydrochlorothiazide (MAXZIDE-25) 37.5-25 MG tablet Take 1 tablet by mouth daily.   Yes [provider]  venlafaxine XR (  EFFEXOR XR) 75 MG 24 hr capsule Take 1 capsule (75 mg total) by mouth daily with breakfast. 09/11/20  Yes Glean Salvo, NP    Allergies as of 07/30/2020 - Review Complete 07/30/2020  Allergen Reaction Noted  . Percocet [oxycodone-acetaminophen] Nausea And Vomiting 11/24/2018    Family History  Problem Relation Age of Onset  . Heart attack Mother   . Stroke Mother   . Cancer Mother        breast cancer  . Hyperlipidemia Mother   . Hypothyroidism Mother   . Cancer Father        lymph nodes, liver cancer  . Colon cancer Father        early 56s  . Other Daughter        Fine Gold Type II Syndrome and cognetive issues  . Memory loss Daughter   . Other Son        Fine Gold Syndrome and BPES  . Diabetes Son 22       IDDM  . Hypothyroidism Sister   . Cancer Sister   . Hyperthyroidism Neg Hx     Social History   Socioeconomic History  . Marital status: Married    Spouse name: Not on file  . Number of children: Not on file  . Years of education: 66  . Highest education level: Not  on file  Occupational History  . Not on file  Tobacco Use  . Smoking status: Never Smoker  . Smokeless tobacco: Never Used  Vaping Use  . Vaping Use: Never used  Substance and Sexual Activity  . Alcohol use: Yes    Comment: once a year  . Drug use: Never  . Sexual activity: Not on file  Other Topics Concern  . Not on file  Social History Narrative   Left handed    Caffeine 1 daily    Lives at home with husband and adult daughter   Social Determinants of Health   Financial Resource Strain: Not on file  Food Insecurity: Not on file  Transportation Needs: Not on file  Physical Activity: Not on file  Stress: Not on file  Social Connections: Not on file  Intimate Partner Violence: Not on file    Review of Systems: See HPI, otherwise negative ROS  Physical Exam: BP 132/80   Pulse 96   Temp 98.4 F (36.9 C) (Oral)   Resp 17   SpO2 99%  General:   Alert,  Well-developed, well-nourished, pleasant and cooperative in NAD Neck:  Supple; no masses or thyromegaly. No significant cervical adenopathy. Lungs:  Clear throughout to auscultation.   No wheezes, crackles, or rhonchi. No acute distress. Heart:  Regular rate and rhythm; no murmurs, clicks, rubs,  or gallops. Abdomen: Non-distended, normal bowel sounds.  Soft and nontender without appreciable mass or hepatosplenomegaly.  Pulses:  Normal pulses noted. Extremities:  Without clubbing or edema.  Impression/Plan: 49 year old lady with multiple medical problems on multiple medications here for further evaluation of nonspecific abdominal pain and high risk screening colonoscopy per plan. The risks, benefits, limitations, imponderables and alternatives regarding both EGD and colonoscopy have been reviewed with the patient. Questions have been answered. All parties agreeable.       Notice: This dictation was prepared with Dragon dictation along with smaller phrase technology. Any transcriptional errors that result from this  process are unintentional and may not be corrected upon review.

## 2020-11-04 NOTE — Discharge Instructions (Signed)
Colonoscopy Discharge Instructions  Read the instructions outlined below and refer to this sheet in the next few weeks. These discharge instructions provide you with general information on caring for yourself after you leave the hospital. Your doctor may also give you specific instructions. While your treatment has been planned according to the most current medical practices available, unavoidable complications occasionally occur. If you have any problems or questions after discharge, call Dr. Gala Lester at [phone]. ACTIVITY  You may resume your regular activity, but move at a slower pace for the next 24 hours.   Take frequent rest periods for the next 24 hours.   Walking will help get rid of the air and reduce the bloated feeling in your belly (abdomen).   No driving for 24 hours (because of the medicine (anesthesia) used during the test).    Do not sign any important legal documents or operate any machinery for 24 hours (because of the anesthesia used during the test).  NUTRITION  Drink plenty of fluids.   You may resume your normal diet as instructed by your doctor.   Begin with a light meal and progress to your normal diet. Heavy or fried foods are harder to digest and may make you feel sick to your stomach (nauseated).   Avoid alcoholic beverages for 24 hours or as instructed.  MEDICATIONS  You may resume your normal medications unless your doctor tells you otherwise.  WHAT YOU CAN EXPECT TODAY  Some feelings of bloating in the abdomen.   Passage of more gas than usual.   Spotting of blood in your stool or on the toilet paper.  IF YOU HAD POLYPS REMOVED DURING THE COLONOSCOPY:  No aspirin products for 7 days or as instructed.   No alcohol for 7 days or as instructed.   Eat a soft diet for the next 24 hours.  FINDING OUT THE RESULTS OF YOUR TEST Not all test results are available during your visit. If your test results are not back during the visit, make an appointment  with your caregiver to find out the results. Do not assume everything is normal if you have not heard from your caregiver or the medical facility. It is important for you to follow up on all of your test results.  SEEK IMMEDIATE MEDICAL ATTENTION IF:  You have more than a spotting of blood in your stool.   Your belly is swollen (abdominal distention).   You are nauseated or vomiting.   You have a temperature over 101.   You have abdominal pain or discomfort that is severe or gets worse throughout the day.    EGD Discharge instructions Please read the instructions outlined below and refer to this sheet in the next few weeks. These discharge instructions provide you with general information on caring for yourself after you leave the hospital. Your doctor may also give you specific instructions. While your treatment has been planned according to the most current medical practices available, unavoidable complications occasionally occur. If you have any problems or questions after discharge, please call your doctor. ACTIVITY  You may resume your regular activity but move at a slower pace for the next 24 hours.   Take frequent rest periods for the next 24 hours.   Walking will help expel (get rid of) the air and reduce the bloated feeling in your abdomen.   No driving for 24 hours (because of the anesthesia (medicine) used during the test).   You may shower.   Do not sign  any important legal documents or operate any machinery for 24 hours (because of the anesthesia used during the test).  NUTRITION  Drink plenty of fluids.   You may resume your normal diet.   Begin with a light meal and progress to your normal diet.   Avoid alcoholic beverages for 24 hours or as instructed by your caregiver.  MEDICATIONS  You may resume your normal medications unless your caregiver tells you otherwise.  WHAT YOU CAN EXPECT TODAY  You may experience abdominal discomfort such as a feeling of  fullness or "gas" pains.  FOLLOW-UP  Your doctor will discuss the results of your test with you.  SEEK IMMEDIATE MEDICAL ATTENTION IF ANY OF THE FOLLOWING OCCUR:  Excessive nausea (feeling sick to your stomach) and/or vomiting.   Severe abdominal pain and distention (swelling).   Trouble swallowing.   Temperature over 101 F (37.8 C).   Rectal bleeding or vomiting of blood.    Both your EGD and colonoscopy were normal today  Recommend a repeat colonoscopy in 5 years  Office visit with Amy Lester in 2 to 3 weeks  At patient request, I called Amy Lester at [phone] -got voicemail.  Left a message.

## 2020-11-07 ENCOUNTER — Encounter (HOSPITAL_COMMUNITY): Payer: Self-pay | Admitting: Internal Medicine

## 2020-11-07 ENCOUNTER — Encounter: Payer: Self-pay | Admitting: Gastroenterology

## 2020-11-12 ENCOUNTER — Encounter: Payer: Self-pay | Admitting: Family Medicine

## 2020-11-12 ENCOUNTER — Ambulatory Visit (HOSPITAL_COMMUNITY): Admission: RE | Admit: 2020-11-12 | Payer: Self-pay | Source: Ambulatory Visit

## 2020-11-12 ENCOUNTER — Other Ambulatory Visit: Payer: Self-pay | Admitting: Family Medicine

## 2020-11-12 ENCOUNTER — Other Ambulatory Visit: Payer: Self-pay

## 2020-11-12 ENCOUNTER — Ambulatory Visit (INDEPENDENT_AMBULATORY_CARE_PROVIDER_SITE_OTHER): Payer: Self-pay | Admitting: Family Medicine

## 2020-11-12 ENCOUNTER — Other Ambulatory Visit (HOSPITAL_COMMUNITY): Payer: Self-pay | Admitting: Family Medicine

## 2020-11-12 VITALS — BP 138/90 | HR 77 | Temp 97.7°F | Ht 64.0 in | Wt 219.1 lb

## 2020-11-12 DIAGNOSIS — M79621 Pain in right upper arm: Secondary | ICD-10-CM

## 2020-11-12 MED ORDER — NAPROXEN 500 MG PO TABS: 500.0000 mg | ORAL_TABLET | Freq: Two times a day (BID) | ORAL | 0 refills | Status: DC

## 2020-11-12 NOTE — Progress Notes (Signed)
Established Patient Office Visit  Subjective:  Patient ID: Amy NEIDLINGER, female    DOB: Jul 01, 1971  Age: 50 y.o. MRN: [id number]  CC:  Chief Complaint  Patient presents with  . Arm Pain    Patient complains of pain under right axillary     HPI Amy Lester presents for persistent right axillary pain.  Refer to note from 10/29/2020 for details.  She presented at that time with about 2-week history of right axillary pain with no injury.  She been placed on doxycycline by urgent care and she does think this may have helped somewhat.  There is never any signs of infection such as erythema and no fevers or chills.  After finishing the doxycycline her soreness seems to have increased again.  Her last mammogram was apparently 8/20.  Recent breast exam was otherwise unremarkable.  We had ordered right axillary ultrasound and patient was told this cannot be done without diagnostic mammogram.  Patient has not noted any breast masses.  No nipple discharge.  No axillary adenopathy.  No right upper extremity swelling.  No recent appetite or weight changes.  Past Medical History:  Diagnosis Date  . Asthma   . Blood transfusion without reported diagnosis   . Chronic migraine   . Common migraine with intractable migraine 02/16/2019  . Diabetes mellitus without complication (HCC)   . Endometriosis   . Fatty liver   . GERD (gastroesophageal reflux disease)   . Hyperlipidemia   . Hypertension   . Hypothyroidism   . Incontinence   . Irregular heartbeat   . Multiple sclerosis (HCC)   . Palpitations   . Postconcussive syndrome 05/09/2020  . Pre-diabetes   . Sleep apnea     Past Surgical History:  Procedure Laterality Date  . ABDOMINAL HYSTERECTOMY    . CESAREAN SECTION     x1  . CHOLECYSTECTOMY    . COLONOSCOPY WITH PROPOFOL N/A 11/04/2020   Procedure: COLONOSCOPY WITH PROPOFOL;  Surgeon: Corbin Ade, MD;  Location: [facility];  Service: Endoscopy;  Laterality: N/A;  12:45pm  .  ENDOMETRIAL ABLATION    . ESOPHAGOGASTRODUODENOSCOPY (EGD) WITH PROPOFOL N/A 11/04/2020   Procedure: ESOPHAGOGASTRODUODENOSCOPY (EGD) WITH PROPOFOL;  Surgeon: Corbin Ade, MD;  Location: [facility];  Service: Endoscopy;  Laterality: N/A;  . FOOT SURGERY Left 06/2018   foot reconstruction  . REPLACEMENT TOTAL KNEE Left     Family History  Problem Relation Age of Onset  . Heart attack Mother   . Stroke Mother   . Cancer Mother        breast cancer  . Hyperlipidemia Mother   . Hypothyroidism Mother   . Cancer Father        lymph nodes, liver cancer  . Colon cancer Father        early 75s  . Other Daughter        Fine Gold Type II Syndrome and cognetive issues  . Memory loss Daughter   . Other Son        Fine Gold Syndrome and BPES  . Diabetes Son 22       IDDM  . Hypothyroidism Sister   . Cancer Sister   . Hyperthyroidism Neg Hx     Social History   Socioeconomic History  . Marital status: Married    Spouse name: Not on file  . Number of children: Not on file  . Years of education: 68  . Highest education level: Not on file  Occupational History  . Not on file  Tobacco Use  . Smoking status: Never Smoker  . Smokeless tobacco: Never Used  Vaping Use  . Vaping Use: Never used  Substance and Sexual Activity  . Alcohol use: Yes    Comment: once a year  . Drug use: Never  . Sexual activity: Not on file  Other Topics Concern  . Not on file  Social History Narrative   Left handed    Caffeine 1 daily    Lives at home with husband and adult daughter   Social Determinants of Health   Financial Resource Strain: Not on file  Food Insecurity: Not on file  Transportation Needs: Not on file  Physical Activity: Not on file  Stress: Not on file  Social Connections: Not on file  Intimate Partner Violence: Not on file    Outpatient Medications Prior to Visit  Medication Sig Dispense Refill  . acetaminophen (TYLENOL) 500 MG tablet Take 500 mg by mouth every 6  (six) hours as needed for moderate pain.    Marland Kitchen albuterol (VENTOLIN HFA) 108 (90 Base) MCG/ACT inhaler Inhale 3 puffs into the lungs every 6 (six) hours as needed for wheezing or shortness of breath. (Patient taking differently: Inhale 1-3 puffs into the lungs every 6 (six) hours as needed for wheezing or shortness of breath.) 3 g 1  . calcium carbonate (TUMS - DOSED IN MG ELEMENTAL CALCIUM) 500 MG chewable tablet Chew 2-3 tablets by mouth daily as needed for indigestion or heartburn.    . cetirizine (ZYRTEC) 10 MG tablet Take 1 tablet (10 mg total) by mouth daily. 90 tablet 0  . clotrimazole-betamethasone (LOTRISONE) cream Apply 1 application topically as needed. (Patient taking differently: Apply 1 application topically as needed (Outbreak).) 45 g 2  . Cyanocobalamin (B-12 COMPLIANCE INJECTION IJ) Inject 1 Dose as directed every 30 (thirty) days.    Marland Kitchen doxycycline (VIBRAMYCIN) 100 MG capsule Take 1 capsule (100 mg total) by mouth 2 (two) times daily. 20 capsule 0  . Erenumab-aooe (AIMOVIG, 140 MG DOSE,) 70 MG/ML SOAJ Inject 140 mg into the skin every 30 (thirty) days. (Patient taking differently: Inject 140 mg into the skin every 28 (twenty-eight) days.) 1.12 mL 5  . erythromycin ophthalmic ointment Place 1 application into both eyes at bedtime.    Marland Kitchen estradiol (EVAMIST) 1.53 MG/SPRAY transdermal spray Place 3 sprays onto the skin daily.    Marland Kitchen ezetimibe (ZETIA) 10 MG tablet Take 10 mg by mouth daily.     Marland Kitchen glipiZIDE (GLUCOTROL) 10 MG tablet Take 10 mg by mouth daily.     Marland Kitchen ibuprofen (ADVIL) 200 MG tablet Take 600-800 mg by mouth every 6 (six) hours as needed for mild pain or moderate pain.    Marland Kitchen insulin degludec (TRESIBA FLEXTOUCH) 100 UNIT/ML FlexTouch Pen Inject 20 units under the skin twice daily. (Patient taking differently: Inject 20 Units into the skin 2 (two) times daily.) 15 mL 0  . levothyroxine (SYNTHROID) 100 MCG tablet Take 100 mcg by mouth See admin instructions. Take with the to equal  225 mcg, 6 days per week. Skip Sundays    . lisinopril (ZESTRIL) 5 MG tablet Take 1 tablet (5 mg total) by mouth daily. 90 tablet 1  . MAGNESIUM OXIDE PO Take 1 tablet by mouth daily.    Marland Kitchen MELATONIN PO Take 1 capsule by mouth at bedtime as needed (sleep).    . metFORMIN (GLUCOPHAGE) 1000 MG tablet Take 1,000 mg by mouth 2 (two) times  daily.    . metoprolol tartrate (LOPRESSOR) 25 MG tablet Take 25 mg by mouth in the morning.    . metoprolol tartrate (LOPRESSOR) 50 MG tablet Take 50 mg by mouth at bedtime.    . metroNIDAZOLE (METROGEL) 1 % gel Apply 1 application topically at bedtime.     . montelukast (SINGULAIR) 10 MG tablet Take 10 mg by mouth every morning.    . Multiple Vitamins-Minerals (MULTIVITAMIN WITH MINERALS) tablet Take 1 tablet by mouth daily.    Marland Kitchen nystatin cream (MYCOSTATIN) Apply 1 application topically daily as needed for dry skin.    Marland Kitchen Olopatadine HCl (PAZEO OP) Place 1 drop into both eyes daily.    Marland Kitchen omeprazole (PRILOSEC) 40 MG capsule Take 1 capsule (40 mg total) by mouth 2 (two) times daily before a meal. (Patient taking differently: Take 40 mg by mouth daily.) 60 capsule 3  . ondansetron (ZOFRAN) 4 MG tablet Take 4 mg by mouth every 8 (eight) hours as needed for nausea or vomiting.    . ondansetron (ZOFRAN) 8 MG tablet Take 1 tablet (8 mg total) by mouth every 8 (eight) hours as needed for nausea or vomiting. 20 tablet 1  . Red Yeast Rice Extract (RED YEAST RICE PO) Take 1 tablet by mouth daily.     . Riboflavin (VITAMIN B-2 PO) Take 1 tablet by mouth daily.     . rizatriptan (MAXALT) 10 MG tablet Take 1 tablet (10 mg total) by mouth 3 (three) times daily as needed for migraine. 10 tablet 5  . Semaglutide, 1 MG/DOSE, (OZEMPIC, 1 MG/DOSE,) 2 MG/1.5ML SOPN Inject 1 mg into the skin once a week. (Patient taking differently: Inject 1 mg into the skin every Sunday.) 4.5 mL 1  . SYNTHROID 125 MCG tablet Take two tablets (250mg  total) by mouth days 1-6 and skip day 7. (Patient taking  differently: Take 125 mcg by mouth See admin instructions. Take with 100 mcg to equal 225 mcg, 6 days per week. Skip Sundays) 180 tablet 1  . Tretinoin, Facial Wrinkles, (RENOVA) 0.02 % CREA Apply 1 application topically at bedtime.    . triamcinolone cream (KENALOG) 0.1 % Apply 1 application topically 2 (two) times daily as needed. (Patient taking differently: Apply 1 application topically 2 (two) times daily as needed (rash).) 45 g 0  . triamterene-hydrochlorothiazide (MAXZIDE-25) 37.5-25 MG tablet Take 1 tablet by mouth daily.    06-13-1992 venlafaxine XR (EFFEXOR XR) 75 MG 24 hr capsule Take 1 capsule (75 mg total) by mouth daily with breakfast. 30 capsule 5   No facility-administered medications prior to visit.    Allergies  Allergen Reactions  . Crestor [Rosuvastatin]     myalgia  . Lipitor [Atorvastatin]     myalgia  . Percocet [Oxycodone-Acetaminophen] Nausea And Vomiting  . Zocor [Simvastatin]     myalgia    ROS Review of Systems  Constitutional: Negative for appetite change, chills, fever and unexpected weight change.  Respiratory: Negative for shortness of breath.   Cardiovascular: Negative for chest pain.  Hematological: Negative for adenopathy.      Objective:    Physical Exam Vitals reviewed.  Constitutional:      Appearance: Normal appearance.  Cardiovascular:     Rate and Rhythm: Normal rate and regular rhythm.  Pulmonary:     Effort: Pulmonary effort is normal.     Breath sounds: Normal breath sounds.  Musculoskeletal:     Comments: Right axillary region reveals no erythema.  No obvious warmth.  She  remains very tender in the mid axillary region with no palpable adenopathy or other palpable mass.  No evidence for hidradenitis changes. No right upper extremity swelling.  Neurological:     Mental Status: She is alert.     BP 138/90 (BP Location: Left Arm, Patient Position: Sitting, Cuff Size: Large)   Pulse 77   Temp 97.7 F (36.5 C) (Oral)   Ht 5\' 4"  (1.626  m)   Wt 219 lb 1.6 oz (99.4 kg)   SpO2 98%   BMI 37.61 kg/m  Wt Readings from Last 3 Encounters:  11/12/20 219 lb 1.6 oz (99.4 kg)  10/31/20 213 lb (96.6 kg)  10/29/20 221 lb 6.4 oz (100.4 kg)     Health Maintenance Due  Topic Date Due  . PNEUMOCOCCAL POLYSACCHARIDE VACCINE AGE 72-64 HIGH RISK  Never done  . COVID-19 Vaccine (1) Never done  . HIV Screening  Never done  . TETANUS/TDAP  Never done  . PAP SMEAR-Modifier  Never done  . FOOT EXAM  07/23/2020    There are no preventive care reminders to display for this patient.  Lab Results  Component Value Date   TSH 1.89 07/15/2020   Lab Results  Component Value Date   WBC 7.7 07/24/2020   HGB 13.7 07/24/2020   HCT 43.3 07/24/2020   MCV 92.7 07/24/2020   PLT 358 07/24/2020   Lab Results  Component Value Date   NA 137 10/31/2020   K 3.6 10/31/2020   CO2 27 10/31/2020   GLUCOSE 170 (H) 10/31/2020   BUN 12 10/31/2020   CREATININE 0.97 10/31/2020   BILITOT 0.3 08/27/2020   ALKPHOS 140 (H) 08/27/2020   AST 23 08/27/2020   ALT 31 08/27/2020   PROT 7.9 08/27/2020   ALBUMIN 4.4 08/27/2020   CALCIUM 9.2 10/31/2020   ANIONGAP 8 10/31/2020   GFR 90.03 07/24/2019   Lab Results  Component Value Date   CHOL 244 (H) 08/27/2020   Lab Results  Component Value Date   HDL 48 08/27/2020   Lab Results  Component Value Date   LDLCALC 159 (H) 08/27/2020   Lab Results  Component Value Date   TRIG 204 (H) 08/27/2020   Lab Results  Component Value Date   CHOLHDL 5.1 (H) 08/27/2020   Lab Results  Component Value Date   HGBA1C 6.7 (A) 10/29/2020      Assessment & Plan:   Problem List Items Addressed This Visit   None   Visit Diagnoses    Axillary pain, right    -  Primary   Relevant Orders   MM Digital Diagnostic Bilat    Etiology unclear.  No evidence for adenopathy.  No evidence for obvious infection.  No hidradenitis changes.  No evidence for upper extremity DVT. -We have added diagnostic mammogram to  orders for right axillary ultrasound and she is scheduled get those tomorrow. -We did suggest she try naproxen 500 mg twice daily with food to see if this helps alleviate some of her soreness until we can sort out  Meds ordered this encounter  Medications  . naproxen (NAPROSYN) 500 MG tablet    Sig: Take 1 tablet (500 mg total) by mouth 2 (two) times daily with a meal.    Dispense:  30 tablet    Refill:  0    Follow-up: No follow-ups on file.    10/31/2020, MD

## 2020-11-12 NOTE — Patient Instructions (Signed)
Take the Naproxen twice daily with food to see if this helps with the pain  We will call you as soon as we get the ultrasound and mammogram results.

## 2020-11-13 ENCOUNTER — Ambulatory Visit (HOSPITAL_COMMUNITY): Admission: RE | Admit: 2020-11-13 | Payer: Self-pay | Source: Ambulatory Visit

## 2020-11-19 ENCOUNTER — Ambulatory Visit: Payer: Medicaid Other

## 2020-11-19 ENCOUNTER — Ambulatory Visit (HOSPITAL_COMMUNITY)
Admission: RE | Admit: 2020-11-19 | Discharge: 2020-11-19 | Disposition: A | Discharge status: Home / Self Care | Payer: Self-pay | Source: Ambulatory Visit | Attending: Family Medicine | Admitting: Family Medicine

## 2020-11-19 ENCOUNTER — Other Ambulatory Visit: Payer: Self-pay

## 2020-11-19 ENCOUNTER — Other Ambulatory Visit (HOSPITAL_COMMUNITY): Payer: Self-pay

## 2020-11-19 DIAGNOSIS — M79621 Pain in right upper arm: Secondary | ICD-10-CM

## 2020-11-19 IMAGING — US US BREAST*R* LIMITED INC AXILLA
1 series · 7 of 7 positions shown · non-contrast
Comparison: Previous exams.

CLINICAL DATA: 49-year-old female with persistent pain
predominantly involving the right axilla and radiating to involve
the lateral right breast/lateral chest wall.

EXAM:
DIGITAL DIAGNOSTIC BILATERAL MAMMOGRAM WITH TOMOSYNTHESIS AND CAD;
ULTRASOUND RIGHT BREAST LIMITED
TECHNIQUE: Bilateral digital diagnostic mammography and breast tomosynthesis
was performed. The images were evaluated with computer-aided
detection.; Targeted ultrasound examination of the right breast was
performed

[Series 1: us breast*right* limited inc axilla · 0.07mm/px · 7 of 7 slices shown]
[im 1/7]
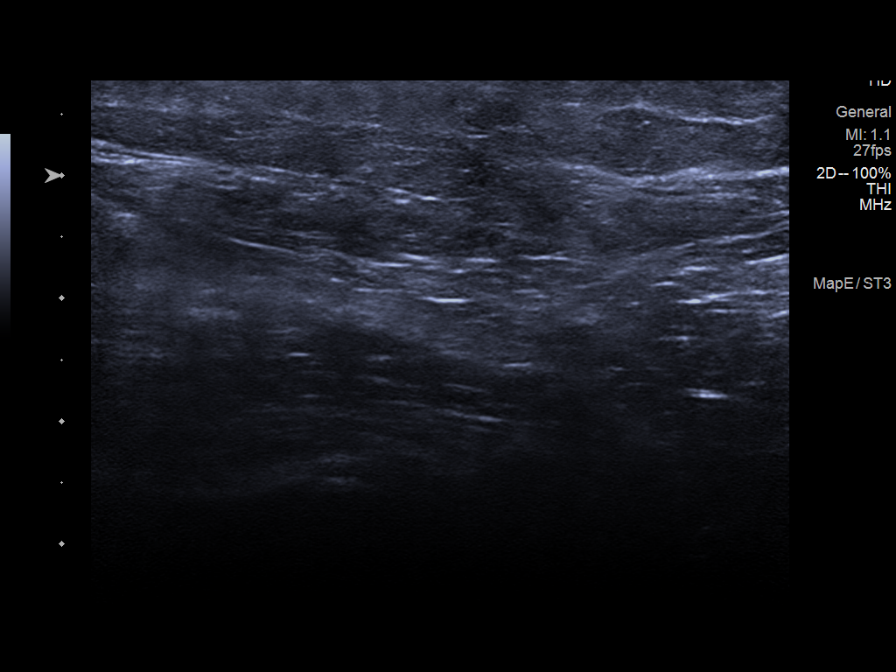
[im 2/7]
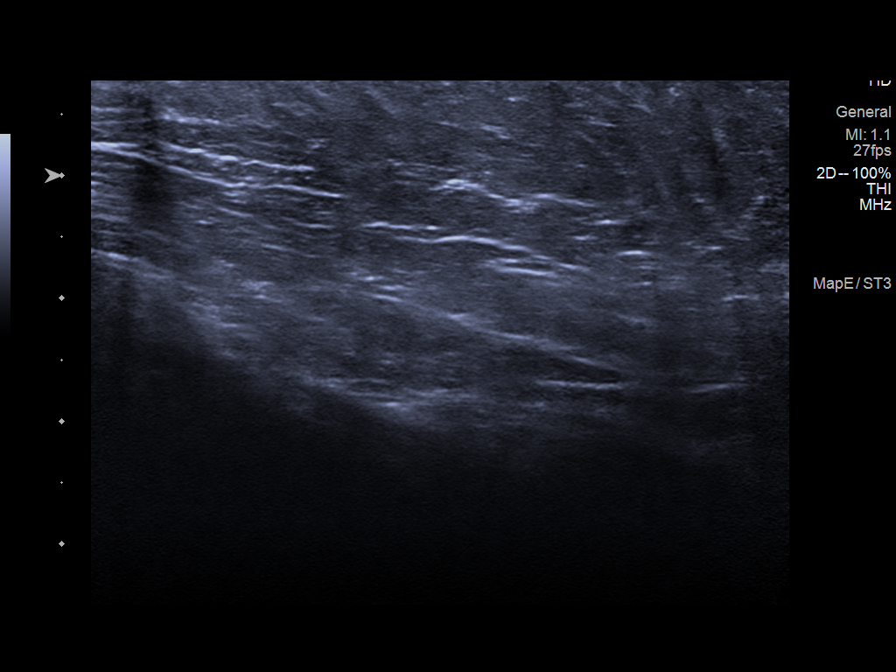
[im 3/7]
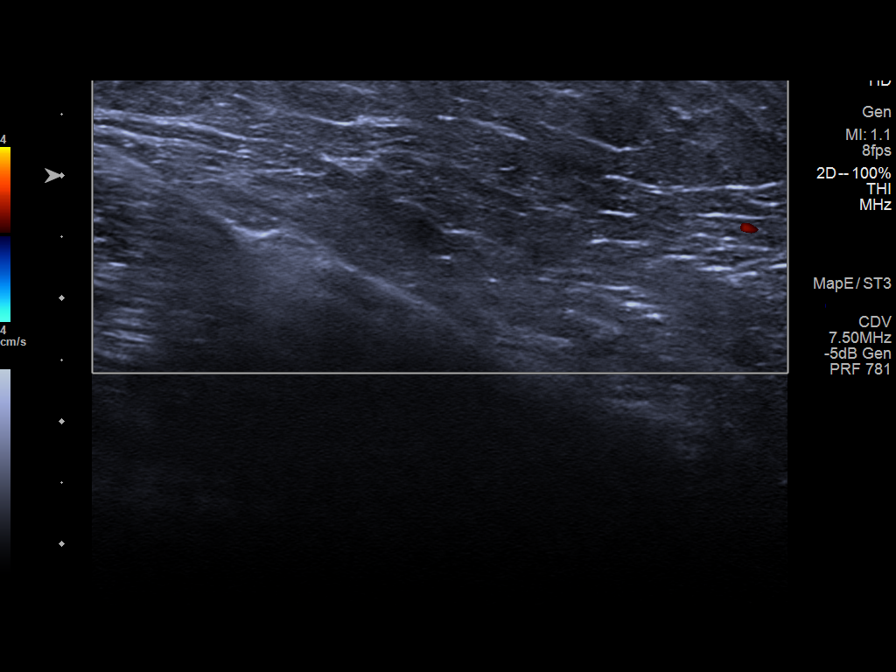
[im 4/7]
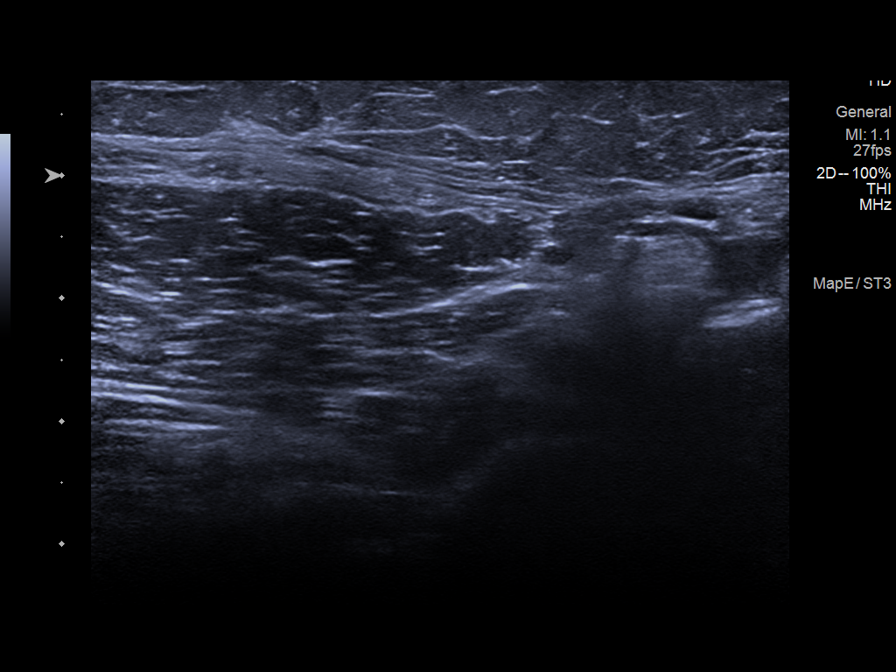
[im 5/7]
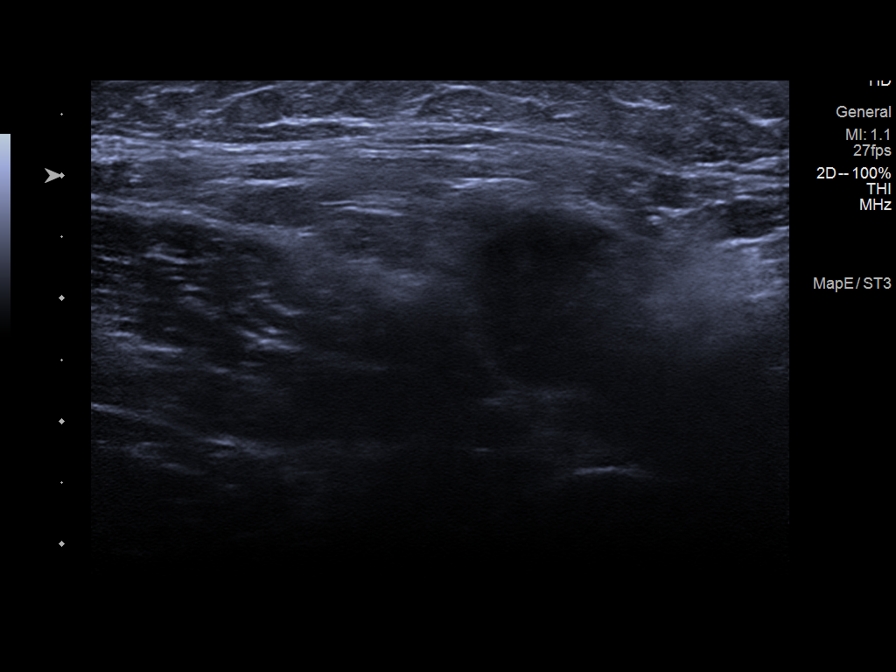
[im 6/7]
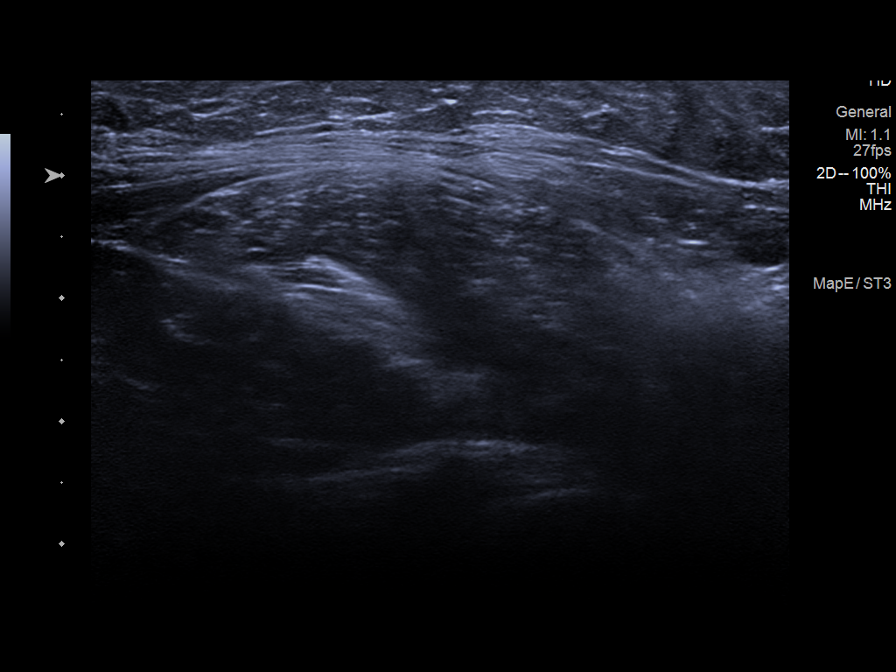
[im 7/7]
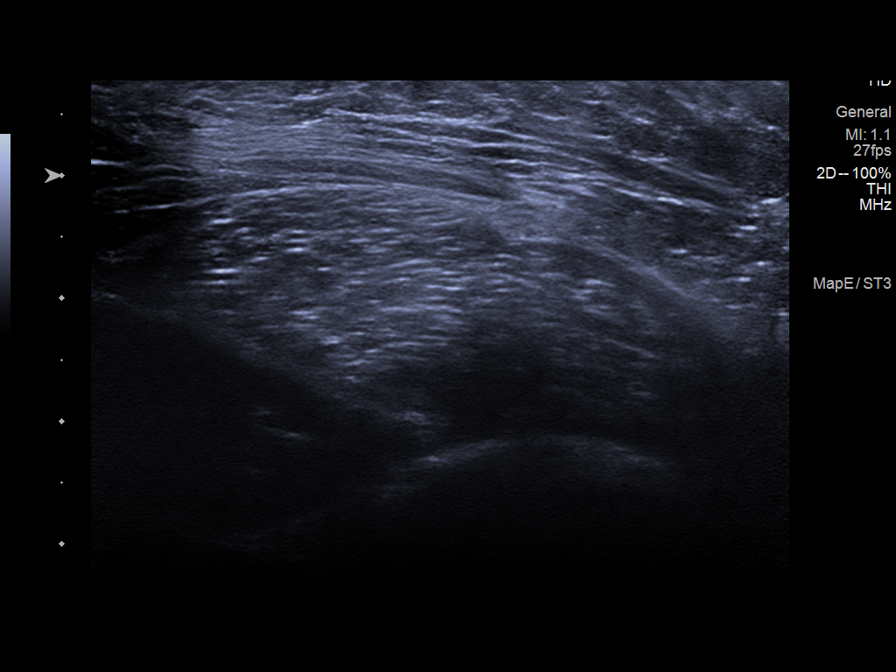

[7 of 7 positions shown; findings below may reference images not displayed]

ACR Breast Density Category b: There are scattered areas of
fibroglandular density.
FINDINGS: No suspicious masses or calcifications are seen in either breast.
Spot compression tangential tomograms were performed over the
palpable area of concern in the right axillary region with no
definite abnormality seen.

Physical examination reveals tenderness of the high right axilla and
right upper/lateral chest wall.

Targeted ultrasound of the right axilla was performed. No suspicious
masses or abnormality seen, only normal-appearing fibrofatty tissue
and lymph nodes identified.
IMPRESSION: 1. No mammographic or sonographic abnormalities to account for the
nonfocal pain involving the right axilla/right lateral breast/chest
wall.

2.  No mammographic evidence of malignancy in either breast.

RECOMMENDATION:
1. Recommend further management of right breast symptoms be based on
clinical assessment.

2.  Screening mammogram in one year.(Code:[Q6])

I have discussed the findings and recommendations with the patient.
If applicable, a reminder letter will be sent to the patient
regarding the next appointment.

BI-RADS CATEGORY  1: Negative.

## 2020-11-19 IMAGING — MG DIGITAL DIAGNOSTIC BILAT W/ TOMO W/ CAD
8 series · 8 of 24 positions shown · non-contrast
Comparison: Previous exams.

CLINICAL DATA: 49-year-old female with persistent pain
predominantly involving the right axilla and radiating to involve
the lateral right breast/lateral chest wall.

EXAM:
DIGITAL DIAGNOSTIC BILATERAL MAMMOGRAM WITH TOMOSYNTHESIS AND CAD;
ULTRASOUND RIGHT BREAST LIMITED
TECHNIQUE: Bilateral digital diagnostic mammography and breast tomosynthesis
was performed. The images were evaluated with computer-aided
detection.; Targeted ultrasound examination of the right breast was
performed

[L CC synth-2D]
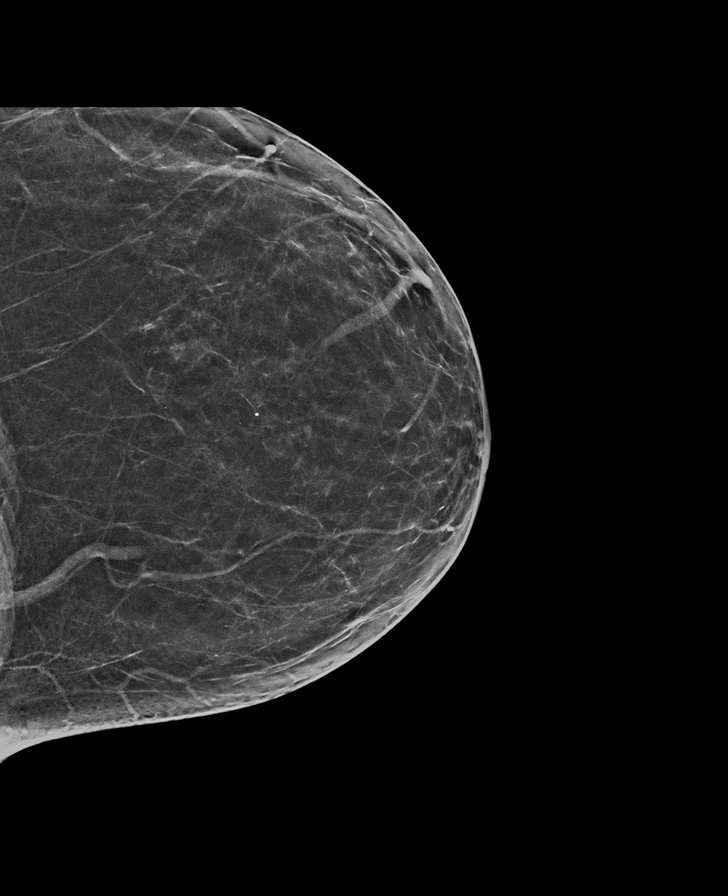

[L MLO synth-2D]
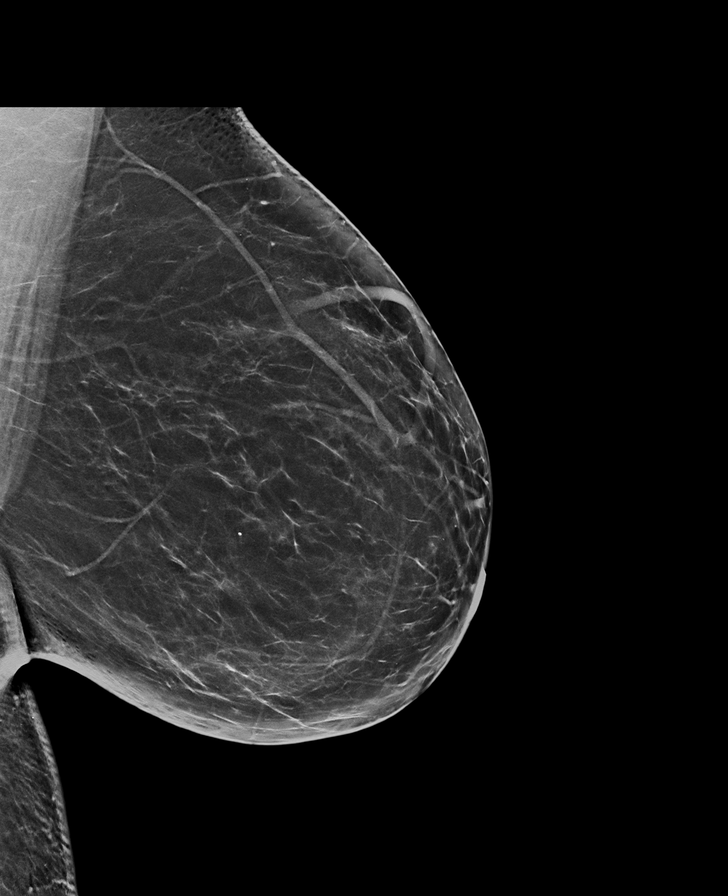

[R CC synth-2D]
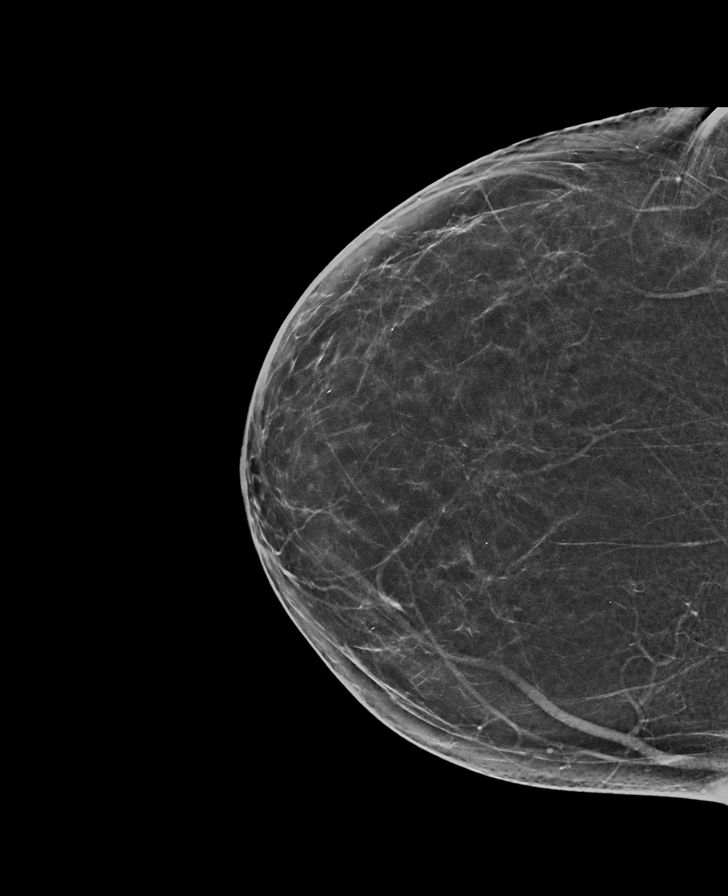

[R MLO synth-2D]
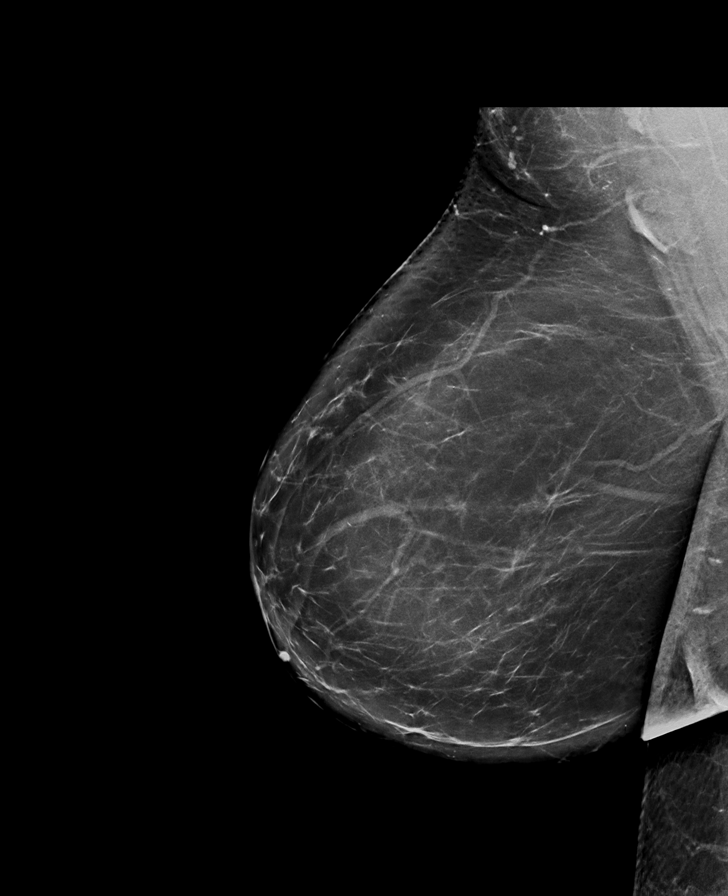

[L MLO tomo · tomo slice 39/77.0]
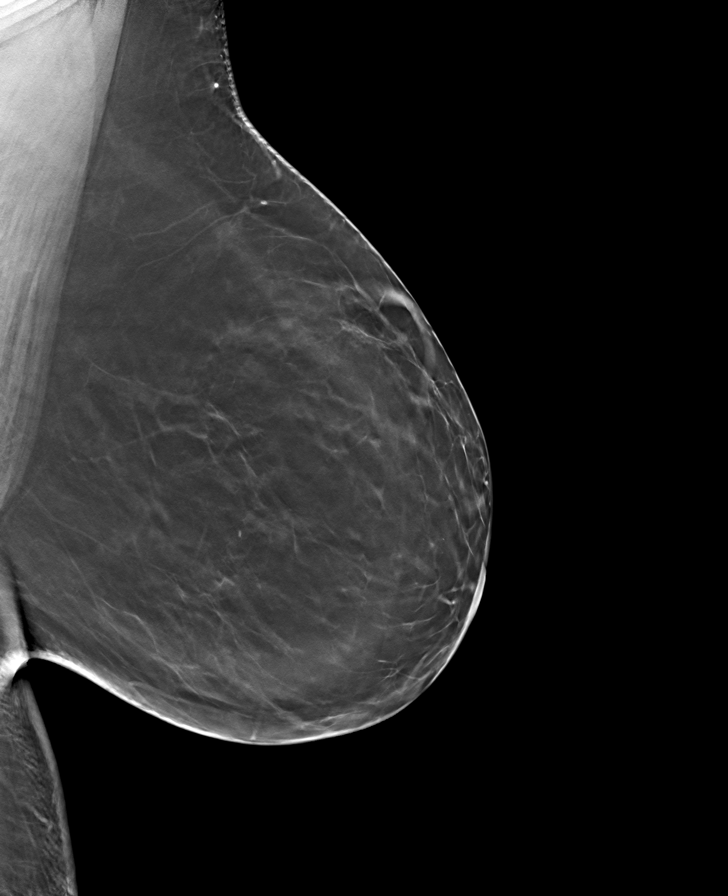

[L CC tomo · tomo slice 35/68.0]
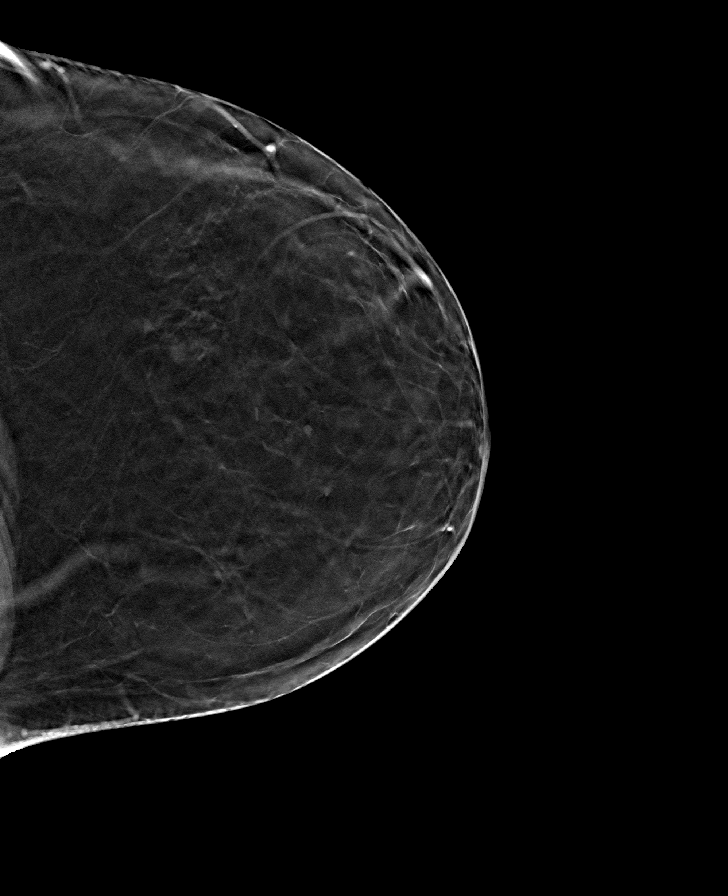

[R MLO tomo · tomo slice 49/97.0]
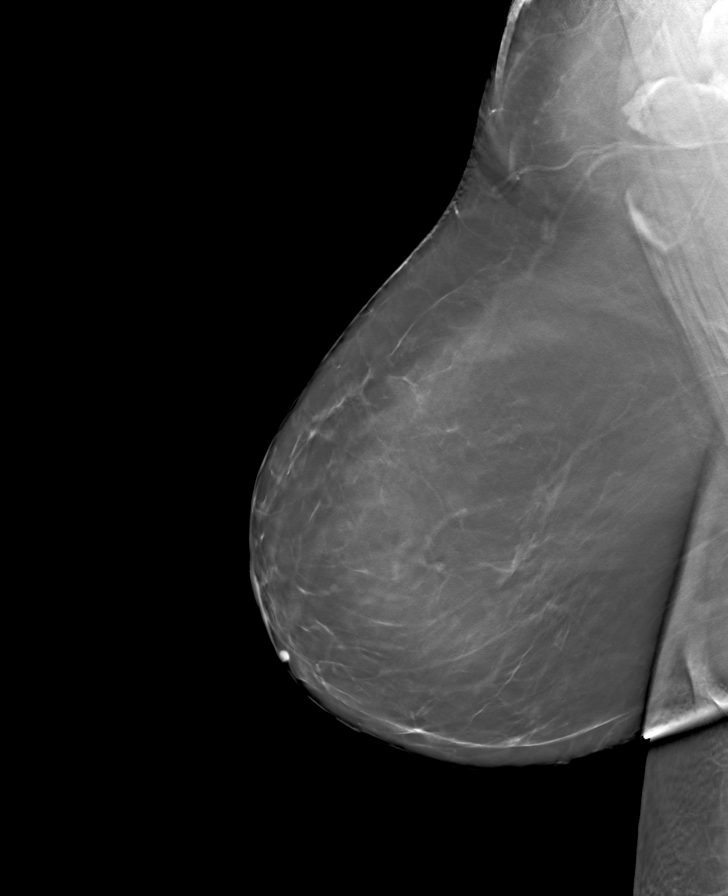

[R CC tomo · tomo slice 35/70.0]
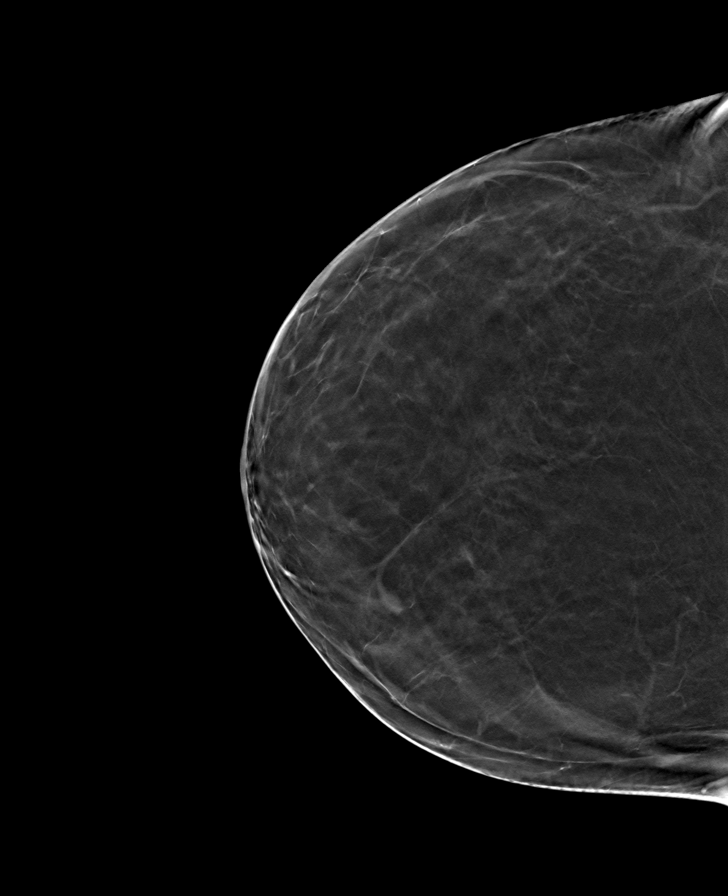

[8 of 24 positions shown; findings below may reference images not displayed]

ACR Breast Density Category b: There are scattered areas of
fibroglandular density.
FINDINGS: No suspicious masses or calcifications are seen in either breast.
Spot compression tangential tomograms were performed over the
palpable area of concern in the right axillary region with no
definite abnormality seen.

Physical examination reveals tenderness of the high right axilla and
right upper/lateral chest wall.

Targeted ultrasound of the right axilla was performed. No suspicious
masses or abnormality seen, only normal-appearing fibrofatty tissue
and lymph nodes identified.
IMPRESSION: 1. No mammographic or sonographic abnormalities to account for the
nonfocal pain involving the right axilla/right lateral breast/chest
wall.

2.  No mammographic evidence of malignancy in either breast.

RECOMMENDATION:
1. Recommend further management of right breast symptoms be based on
clinical assessment.

2.  Screening mammogram in one year.(Code:[Q6])

I have discussed the findings and recommendations with the patient.
If applicable, a reminder letter will be sent to the patient
regarding the next appointment.

BI-RADS CATEGORY  1: Negative.

## 2020-11-21 ENCOUNTER — Telehealth: Payer: Self-pay

## 2020-11-21 NOTE — Telephone Encounter (Signed)
alled and lmom pt to call [employer] to schedule the delivery for the free repatha to her house at [phone]

## 2020-11-22 ENCOUNTER — Encounter: Payer: Self-pay | Admitting: Dietician

## 2020-11-22 ENCOUNTER — Other Ambulatory Visit: Payer: Self-pay

## 2020-11-22 ENCOUNTER — Encounter: Payer: Medicaid Other | Attending: Endocrinology | Admitting: Dietician

## 2020-11-22 DIAGNOSIS — E119 Type 2 diabetes mellitus without complications: Secondary | ICD-10-CM | POA: Insufficient documentation

## 2020-11-22 NOTE — Patient Instructions (Signed)
Mindfulness  Choices  Portions  Eat slowly  Stop when satisfied  When stressed, what can you do rather than eat?  Continue balanced meals Reduce the portion of popcorn (butter) What are you choosing for snacks?  Look at nutrition quality Aim to be active.  Consider Armchair exercises (you tube or TV "Sit and Be Fit" for example)  Continue to check your blood sugar Continue to take your medication as prescribed.  PMA - Positive Mental Attitude Take care of yourself, find time to purposefully relax, do something that you enjoy!  I look forward to seeing you again!

## 2020-11-22 NOTE — Progress Notes (Signed)
Diabetes Self-Management Education  Visit Type: Follow-up  Appt. Start Time: 0910 Appt. End Time: 0950  11/22/2020  [name], identified by name and date of birth, is a 50 y.o. female with a diagnosis of Diabetes:  .   ASSESSMENT She gets her medication through [facility]. The Ozempic has not been increased in this system to the higher dose.  Message sent to Dr. Remus Blake assistant to update prescription. Tolerating higher dose of Ozempic well with mild nausea the first day.  Increased snacking and emotional eating at times.  Uses high amounts of butter at times.  Struggles with the desire to change. Struggles with meals as her daughter is picky and her husband is trying to follow gluten free.  Exercise continues to be limited due to her foot.  She walks around the house small amounts.  Sleeps 10 1/2 hours per night.  States that she always has needed a lot of sleep.  She does use C-pap.  History includes:  Type 2 Diabetes, MS, chronic headaches, fatty liver, endometriosis, HTN, HLD< OSA on C-pap Medications include Glipizide, Metformin, Ozempic, and Tresiba U-200 20 units in the am and 20 units HS. A1C 6.7% 10/29/20 increased from 6.1% 07/15/2020, 9.5% 02/18/2021  Weight hx: 217 lbs 11/22/2020 221 lbs 10/29/20 210 lbs 05/09/2020 lbs 06/14/2020 204 lbs July 2021 230's highest adult weight (2016) 140 lowest adult weight (30's)  Body Composition Scale Jun 14, 2020  Current Body Weight 213.8 lbs  Total Body Fat % 42.4  Visceral Fat 14  Fat-Free Mass % 57.5   Total Body Water % 43.2  Muscle-Mass lbs 30.1  BMI   Body Fat Displacement          Torso  lbs 56         Left Leg  lbs 11.2         Right Leg  lbs 11.2         Left Arm  lbs 5.6         Right Arm   lbs 5.6   Patient lives with her husband and adult daughter who is disabled.  They moved from [place] recently.  Weight 217 lb (98.4 kg). Body mass index is 37.25 kg/m.   Diabetes Self-Management  Education - 11/22/20 1337      Visit Information   Visit Type Follow-up      Pre-Education Assessment   Patient understands the diabetes disease and treatment process. Needs Review    Patient understands incorporating nutritional management into lifestyle. Needs Review    Patient undertands incorporating physical activity into lifestyle. Needs Review    Patient understands using medications safely. Needs Review    Patient understands monitoring blood glucose, interpreting and using results Needs Review    Patient understands prevention, detection, and treatment of acute complications. Needs Review    Patient understands prevention, detection, and treatment of chronic complications. Needs Review    Patient understands how to develop strategies to address psychosocial issues. Needs Review    Patient understands how to develop strategies to promote health/change behavior. Needs Review      Complications   Last HgB A1C per patient/outside source 6.7 %   10/29/20 increased from 6.1% 07/2020   How often do you check your blood sugar? 1-2 times/day    Fasting Blood glucose range (mg/dL) 80-998      Dietary Intake   Breakfast 2 cups oatmeal, 2-3 eggs   8-10:30   Snack (morning) occasional granola bar or candy bar  Lunch sandwich or soup and salad or chicken    Snack (afternoon) occasional    Dinner meat (sloppy jo or chicken or beef), rice or pasta or roll, vegetables    Snack (evening) popcorn with 1/4 cup butter OR roasted cheerios OR pure fruit popsickle or chocolate covered raisins or cinnamon bears    Beverage(s) water      Exercise   Exercise Type ADL's      Patient Education   Previous Diabetes Education Yes (please comment)    Nutrition management  Meal options for control of blood glucose level and chronic complications.    Physical activity and exercise  Helped patient identify appropriate exercises in relation to his/her diabetes, diabetes complications and other health issue.     Medications Reviewed patients medication for diabetes, action, purpose, timing of dose and side effects.      Individualized Goals (developed by patient)   Nutrition General guidelines for healthy choices and portions discussed    Medications take my medication as prescribed    Monitoring  test my blood glucose as discussed    Reducing Risk increase portions of healthy fats;examine blood glucose patterns      Patient Self-Evaluation of Goals - Patient rates self as meeting previously set goals (% of time)   Nutrition 50 - 75 %    Physical Activity 25 - 50%    Medications >75%    Monitoring >75%    Reducing Risk >75%    Health Coping >75%      Post-Education Assessment   Patient understands the diabetes disease and treatment process. Demonstrates understanding / competency    Patient understands incorporating nutritional management into lifestyle. Needs Review    Patient undertands incorporating physical activity into lifestyle. Demonstrates understanding / competency    Patient understands using medications safely. Demonstrates understanding / competency    Patient understands monitoring blood glucose, interpreting and using results Demonstrates understanding / competency    Patient understands prevention, detection, and treatment of acute complications. Demonstrates understanding / competency    Patient understands prevention, detection, and treatment of chronic complications. Demonstrates understanding / competency    Patient understands how to develop strategies to address psychosocial issues. Needs Review    Patient understands how to develop strategies to promote health/change behavior. Needs Review      Outcomes   Expected Outcomes Demonstrated interest in learning. Expect positive outcomes    Future DMSE 3-4 months    Program Status Not Completed      Subsequent Visit   Since your last visit have you experienced any weight changes? Loss    Weight Loss (lbs) 4    Since  your last visit, are you checking your blood glucose at least once a day? Yes           Individualized Plan for Diabetes Self-Management Training:   Learning Objective:  Patient will have a greater understanding of diabetes self-management. Patient education plan is to attend individual and/or group sessions per assessed needs and concerns.   Plan:   Patient Instructions  Mindfulness  Choices  Portions  Eat slowly  Stop when satisfied  When stressed, what can you do rather than eat?  Continue balanced meals Reduce the portion of popcorn (butter) What are you choosing for snacks?  Look at nutrition quality Aim to be active.  Consider Armchair exercises (you tube or TV "Sit and Be Fit" for example)  Continue to check your blood sugar Continue to take your medication as prescribed.  PMA - Positive Mental Attitude Take care of yourself, find time to purposefully relax, do something that you enjoy!  I look forward to seeing you again!   Expected Outcomes:  Demonstrated interest in learning. Expect positive outcomes  Education material provided: snack list  If problems or questions, patient to contact team via:  Phone  Future DSME appointment: 3-4 months

## 2020-12-06 ENCOUNTER — Ambulatory Visit: Payer: Medicaid Other | Admitting: Gastroenterology

## 2020-12-10 NOTE — Telephone Encounter (Signed)
Received documentation that patient is approved for [employer] for [employer].

## 2020-12-12 NOTE — Progress Notes (Signed)
Referring Provider: Billie Ruddy, MD Primary Care Physician:  Billie Ruddy, MD Primary GI Physician: Dr. Gala Romney  Chief Complaint  Patient presents with  . Gastroesophageal Reflux  . Nausea    Occ vomiting  . Abdominal Pain    Sometimes rlq but mostly llq    HPI:   Amy Lester is a 50 y.o. female presenting today for follow-up of elevated LFTs, GERD, nausea without vomiting, and abdominal pain.  Previously with mildly elevated LFTs though normalized in November 2021.  Ultrasound in May 2021 with hepatomegaly and hepatic steatosis.  Hepatitis testing was negative with no immunity to hepatitis A or hepatitis B.  Iron panel within normal limits.  Suspected fatty liver and possibly med effect in the setting of Tylenol and diclofenac.  Last seen in our office 07/25/2020.  She had greater than 1 year history of lower abdominal pain without trigger that had become constant with intermittent severe LLQ pain with associated nausea.  BMs every 2 to 3 days.  Also with dysuria.  Chronic history of GERD uncontrolled on omeprazole 40 mg daily.  Associated epigastric pain worse with meals.  Mild nausea without vomiting.  Labs day prior to office visit with no significant abnormalities.  She was scheduled for CT, advised to start Colace daily, increase omeprazole to 40 mg twice daily, follow GERD diet, and hope to proceed with EGD and colonoscopy pending CT.  CT A/P 07/26/2020 with no acute findings.  Recommended proceeding with colonoscopy and EGD.  Procedures 11/04/2020: Colonoscopy: Entirely normal exam.  Repeat in 5 years. EGD: Entirely normal exam.  Today:  GERD: Taking omeprazole 40 mg daily. Little breakthrough daily.  Never increased omeprazole to 40 mg twice daily.  States she did not get the prescription and mail.  Continues with intermittent nausea.  Vomiting a couple times a week.  Can occur with reflux. Some days she doesn't have nausea. Other days it can be more severe. Can be  in the morning, before bed, or when she is having abdominal pain. Hasn't identified any food trigger. She has seen a dietician due to pre-diabetes and has been working on dietary adjustments. Taking Zofran once or twice a day.   No early satiety, states she eats quite a bit. Weight is about the same as when we last saw her.   Abdominal pain is random.  Can occur if she is doing something or sitting still. Primarily in the LLQ but can occur in RLQ. 6/7 days a week. Some days are worse than others. Also with increased gas. Eats a lot of vegetables, but she always has.  Pain can last hours. Hasn't identified any alleviating factor. BMs every 2-3 days. They are "normal". No constipation. No diarrhea. Pain isn't associated with meal and doesn't improve with BMs. Tried colace and this didn't help.  Stools became more soft and little more frequent, but no change in her pain.  She also tried MiraLAX which made her stools runny, but again no change in her abdominal pain.   Elevated LFTs: Most recent HFP 08/27/2020 with alk phos 140 (H), AST and ALT within normal limits.  Total bilirubin also within normal limits. No regular tylenol use. Hasn't resumed diclofenac. Reports blood sugars have been doing better since seeing a dietician.    She is taking naproxen and ibuprofen to help with abdominal pain.  She is taking iron to help with intermittent fatigue.  Thought this would help.   Past Medical History:  Diagnosis  Date  . Asthma   . Blood transfusion without reported diagnosis   . Chronic migraine   . Common migraine with intractable migraine 02/16/2019  . Diabetes mellitus without complication ([place])   . Endometriosis   . Fatty liver   . GERD (gastroesophageal reflux disease)   . Hyperlipidemia   . Hypertension   . Hypothyroidism   . Incontinence   . Irregular heartbeat   . Multiple sclerosis ([place])   . Palpitations   . Postconcussive syndrome 05/09/2020  . Pre-diabetes   . Sleep apnea     Past  Surgical History:  Procedure Laterality Date  . ABDOMINAL HYSTERECTOMY    . CESAREAN SECTION     x1  . CHOLECYSTECTOMY    . COLONOSCOPY WITH PROPOFOL N/A 11/04/2020   Surgeon: Daneil Dolin, MD;  Entirely normal exam.  Repeat in 5 years.  . ENDOMETRIAL ABLATION    . ESOPHAGOGASTRODUODENOSCOPY (EGD) WITH PROPOFOL N/A 11/04/2020   Surgeon: Daneil Dolin, MD;  Entirely normal exam.  . FOOT SURGERY Left 06/2018   foot reconstruction  . REPLACEMENT TOTAL KNEE Left     Current Outpatient Medications  Medication Sig Dispense Refill  . acetaminophen (TYLENOL) 500 MG tablet Take 500 mg by mouth every 6 (six) hours as needed for moderate pain.    Marland Kitchen albuterol (VENTOLIN HFA) 108 (90 Base) MCG/ACT inhaler Inhale 3 puffs into the lungs every 6 (six) hours as needed for wheezing or shortness of breath. (Patient taking differently: Inhale 1-3 puffs into the lungs every 6 (six) hours as needed for wheezing or shortness of breath.) 3 g 1  . calcium carbonate (TUMS - DOSED IN MG ELEMENTAL CALCIUM) 500 MG chewable tablet Chew 2-3 tablets by mouth daily as needed for indigestion or heartburn.    . cetirizine (ZYRTEC) 10 MG tablet Take 1 tablet (10 mg total) by mouth daily. 90 tablet 0  . clotrimazole-betamethasone (LOTRISONE) cream Apply 1 application topically as needed. (Patient taking differently: Apply 1 application topically as needed (Outbreak).) 45 g 2  . Cyanocobalamin (B-12 COMPLIANCE INJECTION IJ) Inject 1 Dose as directed every 30 (thirty) days.    Marland Kitchen dicyclomine (BENTYL) 10 MG capsule Take 1 capsule (10 mg total) by mouth 4 (four) times daily -  before meals and at bedtime. 90 capsule 1  . Erenumab-aooe (AIMOVIG, 140 MG DOSE,) 70 MG/ML SOAJ Inject 140 mg into the skin every 30 (thirty) days. (Patient taking differently: Inject 140 mg into the skin every 28 (twenty-eight) days.) 1.12 mL 5  . estradiol (EVAMIST) 1.53 MG/SPRAY transdermal spray Place 3 sprays onto the skin daily.    Marland Kitchen ezetimibe  (ZETIA) 10 MG tablet Take 10 mg by mouth daily.     Marland Kitchen FERROUS SULFATE PO Take by mouth daily.    Marland Kitchen glipiZIDE (GLUCOTROL) 10 MG tablet Take 10 mg by mouth daily.     Marland Kitchen ibuprofen (ADVIL) 200 MG tablet Take 600-800 mg by mouth every 6 (six) hours as needed for mild pain or moderate pain.    Marland Kitchen insulin degludec (TRESIBA FLEXTOUCH) 100 UNIT/ML FlexTouch Pen Inject 20 units under the skin twice daily. (Patient taking differently: Inject 20 Units into the skin 2 (two) times daily.) 15 mL 0  . levothyroxine (SYNTHROID) 100 MCG tablet Take 100 mcg by mouth See admin instructions. Take with the 19mg to equal 225 mcg, 6 days per week. Skip Sundays    . lisinopril (ZESTRIL) 5 MG tablet Take 1 tablet (5 mg total) by mouth daily.  90 tablet 1  . MAGNESIUM OXIDE PO Take 1 tablet by mouth daily.    Marland Kitchen MELATONIN PO Take 1 capsule by mouth at bedtime as needed (sleep).    . metFORMIN (GLUCOPHAGE) 1000 MG tablet Take 1,000 mg by mouth 2 (two) times daily.    . metoprolol tartrate (LOPRESSOR) 25 MG tablet Take 25 mg by mouth in the morning.    . metoprolol tartrate (LOPRESSOR) 50 MG tablet Take 50 mg by mouth at bedtime.    . metroNIDAZOLE (METROGEL) 1 % gel Apply 1 application topically at bedtime.     . montelukast (SINGULAIR) 10 MG tablet Take 10 mg by mouth every morning.    . Multiple Vitamins-Minerals (MULTIVITAMIN WITH MINERALS) tablet Take 1 tablet by mouth as needed.    . naproxen (NAPROSYN) 500 MG tablet Take 1 tablet (500 mg total) by mouth 2 (two) times daily with a meal. 30 tablet 0  . nystatin cream (MYCOSTATIN) Apply 1 application topically daily as needed for dry skin.    Marland Kitchen Olopatadine HCl (PAZEO OP) Place 1 drop into both eyes daily.    Marland Kitchen omeprazole (PRILOSEC) 40 MG capsule Take 1 capsule (40 mg total) by mouth 2 (two) times daily before a meal. 60 capsule 3  . ondansetron (ZOFRAN) 8 MG tablet Take 1 tablet (8 mg total) by mouth every 8 (eight) hours as needed for nausea or vomiting. 20 tablet 1  .  Red Yeast Rice Extract (RED YEAST RICE PO) Take 1 tablet by mouth daily.     . Riboflavin (VITAMIN B-2 PO) Take 1 tablet by mouth daily.     . rizatriptan (MAXALT) 10 MG tablet Take 1 tablet (10 mg total) by mouth 3 (three) times daily as needed for migraine. 10 tablet 5  . Semaglutide, 1 MG/DOSE, (OZEMPIC, 1 MG/DOSE,) 2 MG/1.5ML SOPN Inject 1 mg into the skin once a week. (Patient taking differently: Inject 1 mg into the skin every Sunday.) 4.5 mL 1  . SYNTHROID 125 MCG tablet Take two tablets (233m total) by mouth days 1-6 and skip day 7. (Patient taking differently: Take 125 mcg by mouth See admin instructions. Take with 100 mcg to equal 225 mcg, 6 days per week. Skip Sundays) 180 tablet 1  . Tretinoin, Facial Wrinkles, (RENOVA) 0.02 % CREA Apply 1 application topically at bedtime.    . triamcinolone cream (KENALOG) 0.1 % Apply 1 application topically 2 (two) times daily as needed. (Patient taking differently: Apply 1 application topically 2 (two) times daily as needed (rash).) 45 g 0  . triamterene-hydrochlorothiazide (MAXZIDE-25) 37.5-25 MG tablet Take 1 tablet by mouth daily.    .Marland Kitchenvenlafaxine XR (EFFEXOR XR) 75 MG 24 hr capsule Take 1 capsule (75 mg total) by mouth daily with breakfast. 30 capsule 5  . ondansetron (ZOFRAN) 4 MG tablet Take 1 tablet (4 mg total) by mouth every 8 (eight) hours as needed for nausea or vomiting. 20 tablet 1   No current facility-administered medications for this visit.    Allergies as of 12/13/2020 - Review Complete 12/13/2020  Allergen Reaction Noted  . Crestor [rosuvastatin]  08/27/2020  . Lipitor [atorvastatin]  08/27/2020  . Percocet [oxycodone-acetaminophen] Nausea And Vomiting 11/24/2018  . Zocor [simvastatin]  08/27/2020    Family History  Problem Relation Age of Onset  . Heart attack Mother   . Stroke Mother   . Cancer Mother        breast cancer  . Hyperlipidemia Mother   . Hypothyroidism Mother   .  Cancer Father        lymph nodes, liver  cancer  . Colon cancer Father        early 61s  . Other Daughter        Fine Gold Type II Syndrome and cognetive issues  . Memory loss Daughter   . Other Son        Fine Gold Syndrome and BPES  . Diabetes Son 22       IDDM  . Hypothyroidism Sister   . Cancer Sister   . Hyperthyroidism Neg Hx     Social History   Socioeconomic History  . Marital status: Married    Spouse name: Not on file  . Number of children: Not on file  . Years of education: 62  . Highest education level: Not on file  Occupational History  . Not on file  Tobacco Use  . Smoking status: Never Smoker  . Smokeless tobacco: Never Used  Vaping Use  . Vaping Use: Never used  Substance and Sexual Activity  . Alcohol use: Yes    Comment: once a year  . Drug use: Never  . Sexual activity: Not on file  Other Topics Concern  . Not on file  Social History Narrative   Left handed    Caffeine 1 daily    Lives at home with husband and adult daughter   Social Determinants of Health   Financial Resource Strain: Not on file  Food Insecurity: Not on file  Transportation Needs: Not on file  Physical Activity: Not on file  Stress: Not on file  Social Connections: Not on file    Review of Systems: Gen: Denies fever, chills, cold or flulike symptoms, presyncope, syncope. CV: Denies chest pain or palpitations. Resp: Denies dyspnea or cough. GI: See HPI Heme: See HPI  Physical Exam: BP 131/81   Pulse 74   Temp (!) 97.3 F (36.3 C) (Temporal)   Ht '5\' 4"'  (1.626 m)   Wt 222 lb 12.8 oz (101.1 kg)   BMI 38.24 kg/m  General:   Alert and oriented. No distress noted. Pleasant and cooperative.  Head:  Normocephalic and atraumatic. Eyes:  Conjuctiva clear without scleral icterus. Heart:  S1, S2 present without murmurs appreciated. Lungs:  Clear to auscultation bilaterally. No wheezes, rales, or rhonchi. No distress.  Abdomen:  +BS, soft, and non-distended.  Mild TTP in LLQ.  No rebound or guarding. No HSM or  masses noted. Msk:  Symmetrical without gross deformities. Normal posture. Extremities:  Without edema. Neurologic:  Alert and  oriented x4 Psych:  Normal mood and affect.   Assessment: 50 year old female presenting today for follow-up of GERD, nausea with vomiting, lower abdominal pain, and elevated LFTs.  GERD: Not adequately controlled on omeprazole 40 mg daily previously recommended increasing omeprazole to 4 mg twice daily, but she has not done this.  Recent EGD February 2022 entirely normal.  Recommend increasing omeprazole to 40 MG BID.   Nausea with vomiting: Intermittent nausea several times a week with vomiting a couple times a week.  Seems to occur at random. May be associated with GERD or lower abdominal pain at times.  No identified food triggers and she denies early satiety.  Recent EGD entirely normal.  Etiology is not clear. Query whether this may be secondary to uncontrolled GERD and possible med effect as she is on several medications including ibuprofen, naproxen, iron, and several diabetes medications. Considered gastroparesis in the setting of diabetes, but she denies early satiety.  May pursue this in the future. For now, we will start by increasing omeprazole to 40 mg BID. I have also asked that she discontinue ibuprofen, naproxen, avoid all other NSAIDs, discontinue iron, and discuss with endocrinology if she may be able to come off of metformin.   Lower abdominal pain: Greater than 1.5 no history of intermittent lower abdominal pain primarily in the LLQ but occasionally RLQ without identified trigger.  Symptoms are not brought on by any certain movement, eating, or prior to bowel movements. Symptoms occur most every day and can last hours without identified alleviating factor. Also notes increased gas.  Soft, formed BMs every 2 to 3 days at baseline.  Previously tried Colace and MiraLAX which increased bowel frequency and cause loose stools.  No change in abdominal pain.  Denies  BRBPR or melena.  Recent colonoscopy in February 2022 entirely normal.  CT A/P with contrast in November 2021 with no acute findings. Abdominal exam with mild TTP in LLQ.   Etiology of her abdominal pain is not clear. Query spasm in the GI tract/IBS type picture although her symptoms are not classic for this. May have adhesions secondary to prior abdominal surgeries. Could also have med effect in the setting of iron and several diabetes medications including metformin. Increased gas likely diet related as she consumes a lot of vegetables. Less likely celiac disease. We will try her on Bentyl starting at 10 mg daily, increasing as needed. I will also screen for celiac disease.  I advised she stop iron as she doesn't have anemia or iron deficiency and discuss with endocrine if metformin can be changed/discontiued. Also advised to avoid common gas producing items and try Beano before meals.  Elevated LFTs: Previously with mildly elevated LFTs though normalized in November 2021.  Ultrasound in May 2021 with hepatomegaly and hepatic steatosis.  Hepatitis testing was negative with no immunity to hepatitis A or hepatitis B.  Iron panel within normal limits.  Suspected fatty liver and possibly med effect in the setting of Tylenol and diclofenac. Most recent HFP 08/27/2020 with alk phos 140 (H).  AST, ALT, total bilirubin within normal limits.  No regular alcohol or Tylenol use.  She has also been following with the dietitian and she reports her blood sugars have been under better control.  Weight remains fairly stable.  We will plan to update HFP and obtain GGT at this time.    Plan:  1.  HFP, GGT, IgA, TTG IgA  2.  Increase omeprazole to 40 mg twice daily 30 minutes before breakfast and dinner.  3.  Counseled on GERD diet/lifestyle.  Written instructions provided.  4.  Continue Zofran as needed.   5.  Start dicyclomine 10 mg daily 30 minutes before first meal.  May increase this up to 4 times daily as  needed.  Monitor for side effects including dry mouth, blurry vision, dizziness, or constipation.  Hold dicyclomine the setting of constipation.  6.  May take MiraLAX 1 capful (17 g) in 8 ounces of water if needed for constipation.  7. Avoid common gas producing items including cabbage, cauliflower, broccoli, Brussels sprouts, beans, chewing gum, hard candy, artificial sweeteners, drinking through a straw, carbonated beverages.  8.  Lactose-free diet or taking Lactaid tablets prior to dairy consumption.  9.  May try Beano before meals.  10.  Stop ibuprofen and naproxen avoid all other NSAIDs.  11.  Stop iron.  12.  Discuss with endocrinologist about stopping/changing Metformin.  13.  Follow-up in  3 months.    Aliene Altes, PA-C [facility] 12/13/2020

## 2020-12-13 ENCOUNTER — Ambulatory Visit (INDEPENDENT_AMBULATORY_CARE_PROVIDER_SITE_OTHER): Payer: Self-pay | Admitting: Gastroenterology

## 2020-12-13 ENCOUNTER — Other Ambulatory Visit: Payer: Self-pay

## 2020-12-13 ENCOUNTER — Other Ambulatory Visit (HOSPITAL_COMMUNITY)
Admission: RE | Admit: 2020-12-13 | Discharge: 2020-12-13 | Disposition: A | Discharge status: Home / Self Care | Payer: Medicaid Other | Source: Ambulatory Visit | Attending: Gastroenterology | Admitting: Gastroenterology

## 2020-12-13 ENCOUNTER — Encounter: Payer: Self-pay | Admitting: Gastroenterology

## 2020-12-13 VITALS — BP 131/81 | HR 74 | Temp 97.3°F | Ht 64.0 in | Wt 222.8 lb

## 2020-12-13 DIAGNOSIS — K219 Gastro-esophageal reflux disease without esophagitis: Secondary | ICD-10-CM

## 2020-12-13 DIAGNOSIS — R1030 Lower abdominal pain, unspecified: Secondary | ICD-10-CM

## 2020-12-13 DIAGNOSIS — R112 Nausea with vomiting, unspecified: Secondary | ICD-10-CM

## 2020-12-13 DIAGNOSIS — R143 Flatulence: Secondary | ICD-10-CM | POA: Insufficient documentation

## 2020-12-13 DIAGNOSIS — R1110 Vomiting, unspecified: Secondary | ICD-10-CM | POA: Insufficient documentation

## 2020-12-13 DIAGNOSIS — R7989 Other specified abnormal findings of blood chemistry: Secondary | ICD-10-CM | POA: Insufficient documentation

## 2020-12-13 LAB — GAMMA GT: GGT: 28 U/L (ref 7–50)

## 2020-12-13 LAB — HEPATIC FUNCTION PANEL
ALT: 34 U/L (ref 0–44)
AST: 30 U/L (ref 15–41)
Albumin: 3.8 g/dL (ref 3.5–5.0)
Alkaline Phosphatase: 97 U/L (ref 38–126)
Bilirubin, Direct: 0.1 mg/dL (ref 0.0–0.2)
Indirect Bilirubin: 0.5 mg/dL (ref 0.3–0.9)
Total Bilirubin: 0.6 mg/dL (ref 0.3–1.2)
Total Protein: 7.7 g/dL (ref 6.5–8.1)

## 2020-12-13 MED ORDER — DICYCLOMINE HCL 10 MG PO CAPS: 10.0000 mg | ORAL_CAPSULE | Freq: Three times a day (TID) | ORAL | 1 refills | Status: DC

## 2020-12-13 MED ORDER — OMEPRAZOLE 40 MG PO CPDR: 40.0000 mg | DELAYED_RELEASE_CAPSULE | Freq: Two times a day (BID) | ORAL | 3 refills | Status: DC

## 2020-12-13 MED ORDER — ONDANSETRON HCL 4 MG PO TABS: 4.0000 mg | ORAL_TABLET | Freq: Three times a day (TID) | ORAL | 1 refills | Status: AC | PRN

## 2020-12-13 NOTE — Patient Instructions (Addendum)
Please have labs completed at [facility].  Increase omeprazole to 40 mg twice daily 30 minutes before breakfast and dinner.  Follow a GERD diet:  Avoid fried, fatty, greasy, spicy, citrus foods. Avoid caffeine and carbonated beverages. Avoid chocolate. Try eating 4-6 small meals a day rather than 3 large meals. Do not eat within 3 hours of laying down. Prop head of bed up on wood or bricks to create a 6 inch incline.  Start dicyclomine (Bentyl) 10 mg daily 30 minutes before your first meal.  We can increase this as needed up to 4 times daily.  Monitor for side effects including dry mouth, blurry vision, dizziness, or constipation.  If you develop constipation, hold dicyclomine.  You may take MiraLAX 1 capful (17 g) in 8 ounces of water daily as needed if you develop constipation.  Avoid common gas producing items including cabbage, cauliflower, broccoli, Brussels sprouts, beans, chewing gum, hard candy, artificial sweeteners, drinking through a straw, carbonated beverages.  Try following a lactose-free diet or taking Lactaid tablets prior to dairy consumption.  May try Beano before meals.  Please stop naproxen and ibuprofen and avoid all other NSAID products.  Please also stop iron.  Discuss with your endocrinologist about stopping/changing Metformin to see if this will help with some of your GI symptoms.  We will plan to see back in 3 months.  Do not hesitate to call if he had questions or concerns prior.   [name], PA-C [facility]    Abdominal Bloating When you have abdominal bloating, your abdomen may feel full, tight, or painful. It may also look bigger than normal or swollen (distended). Common causes of abdominal bloating include:  Swallowing air.  Constipation.  Problems digesting food.  Eating too much.  Irritable bowel syndrome. This is a condition that affects the large intestine.  Lactose intolerance. This is an inability to digest  lactose, a natural sugar in dairy products.  Celiac disease. This is a condition that affects the ability to digest gluten, a protein found in some grains.  Gastroparesis. This is a condition that slows down the movement of food in the stomach and small intestine. It is more common in people with diabetes mellitus.  Gastroesophageal reflux disease (GERD). This is a digestive condition that makes stomach acid flow back into the esophagus.  Urinary retention. This means that the body is holding onto urine, and the bladder cannot be emptied all the way. Follow these instructions at home: Eating and drinking  Avoid eating too much.  Try not to swallow air while talking or eating.  Avoid eating while lying down.  Avoid these foods and drinks: ? Foods that cause gas, such as broccoli, cabbage, cauliflower, and baked beans. ? Carbonated drinks. ? Hard candy. ? Chewing gum. Medicines  Take over-the-counter and prescription medicines only as told by your health care provider.  Take probiotic medicines. These medicines contain live bacteria or yeasts that can help digestion.  Take coated peppermint oil capsules. Activity  Try to exercise regularly. Exercise may help to relieve bloating that is caused by gas and relieve constipation. General instructions  Keep all follow-up visits as told by your health care provider. This is important. Contact a health care provider if:  You have nausea and vomiting.  You have diarrhea.  You have abdominal pain.  You have unusual weight loss or weight gain.  You have severe pain, and medicines do not help. Get help right away if:  You have severe chest  pain.  You have trouble breathing.  You have shortness of breath.  You have trouble urinating.  You have darker urine than normal.  You have blood in your stools or have dark, tarry stools. Summary  Abdominal bloating means that the abdomen is swollen.  Common causes of abdominal  bloating are swallowing air, constipation, and problems digesting food.  Avoid eating too much and avoid swallowing air.  Avoid foods that cause gas, carbonated drinks, hard candy, and chewing gum. This information is not intended to replace advice given to you by your health care provider. Make sure you discuss any questions you have with your health care provider. Document Revised: 12/12/2018 Document Reviewed: 09/25/2016 Elsevier Patient Education  2021 Elsevier Inc.  Food Choices for Gastroesophageal Reflux Disease, Adult When you have gastroesophageal reflux disease (GERD), the foods you eat and your eating habits are very important. Choosing the right foods can help ease the discomfort of GERD. Consider working with a dietitian to help you make healthy food choices. What are tips for following this plan? Reading food labels  Look for foods that are low in saturated fat. Foods that have less than 5% of daily value (DV) of fat and 0 g of trans fats may help with your symptoms. Cooking  Cook foods using methods other than frying. This may include baking, steaming, grilling, or broiling. These are all methods that do not need a lot of fat for cooking.  To add flavor, try to use herbs that are low in spice and acidity. Meal planning  Choose healthy foods that are low in fat, such as fruits, vegetables, whole grains, low-fat dairy products, lean meats, fish, and poultry.  Eat frequent, small meals instead of three large meals each day. Eat your meals slowly, in a relaxed setting. Avoid bending over or lying down until 2-3 hours after eating.  Limit high-fat foods such as fatty meats or fried foods.  Limit your intake of fatty foods, such as oils, butter, and shortening.  Avoid the following as told by your health care provider: ? Foods that cause symptoms. These may be different for different people. Keep a food diary to keep track of foods that cause symptoms. ? Alcohol. ? Drinking  large amounts of liquid with meals. ? Eating meals during the 2-3 hours before bed.   Lifestyle  Maintain a healthy weight. Ask your health care provider what weight is healthy for you. If you need to lose weight, work with your health care provider to do so safely.  Exercise for at least 30 minutes on 5 or more days each week, or as told by your health care provider.  Avoid wearing clothes that fit tightly around your waist and chest.  Do not use any products that contain nicotine or tobacco. These products include cigarettes, chewing tobacco, and vaping devices, such as e-cigarettes. If you need help quitting, ask your health care provider.  Sleep with the head of your bed raised. Use a wedge under the mattress or blocks under the bed frame to raise the head of the bed.  Chew sugar-free gum after mealtimes. What foods should I eat? Eat a healthy, well-balanced diet of fruits, vegetables, whole grains, low-fat dairy products, lean meats, fish, and poultry. Each person is different. Foods that may trigger symptoms in one person may not trigger any symptoms in another person. Work with your health care provider to identify foods that are safe for you. The items listed above may not be a complete  list of recommended foods and beverages. Contact a dietitian for more information.   What foods should I avoid? Limiting some of these foods may help manage the symptoms of GERD. Everyone is different. Consult a dietitian or your health care provider to help you identify the exact foods to avoid, if any. Fruits Any fruits prepared with added fat. Any fruits that cause symptoms. For some people this may include citrus fruits, such as oranges, grapefruit, pineapple, and lemons. Vegetables Deep-fried vegetables. [place] fries. Any vegetables prepared with added fat. Any vegetables that cause symptoms. For some people, this may include tomatoes and tomato products, chili peppers, onions and garlic, and  horseradish. Grains Pastries or quick breads with added fat. Meats and other proteins High-fat meats, such as fatty beef or pork, hot dogs, ribs, ham, sausage, salami, and bacon. Fried meat or protein, including fried fish and fried chicken. Nuts and nut butters, in large amounts. Dairy Whole milk and chocolate milk. Sour cream. Cream. Ice cream. Cream cheese. Milkshakes. Fats and oils Butter. Margarine. Shortening. Ghee. Beverages Coffee and tea, with or without caffeine. Carbonated beverages. Sodas. Energy drinks. Fruit juice made with acidic fruits, such as orange or grapefruit. Tomato juice. Alcoholic drinks. Sweets and desserts Chocolate and cocoa. Donuts. Seasonings and condiments Pepper. Peppermint and spearmint. Added salt. Any condiments, herbs, or seasonings that cause symptoms. For some people, this may include curry, hot sauce, or vinegar-based salad dressings. The items listed above may not be a complete list of foods and beverages to avoid. Contact a dietitian for more information. Questions to ask your health care provider Diet and lifestyle changes are usually the first steps that are taken to manage symptoms of GERD. If diet and lifestyle changes do not improve your symptoms, talk with your health care provider about taking medicines. Where to find more information  International Foundation for Gastrointestinal Disorders: aboutgerd.org Summary  When you have gastroesophageal reflux disease (GERD), food and lifestyle choices may be very helpful in easing the discomfort of GERD.  Eat frequent, small meals instead of three large meals each day. Eat your meals slowly, in a relaxed setting. Avoid bending over or lying down until 2-3 hours after eating.  Limit high-fat foods such as fatty meats or fried foods. This information is not intended to replace advice given to you by your health care provider. Make sure you discuss any questions you have with your health care  provider. Document Revised: 03/04/2020 Document Reviewed: 03/04/2020 Elsevier Patient Education  2021 [employer].

## 2020-12-14 LAB — IGA: IgA: 504 mg/dL — ABNORMAL HIGH (ref 87–352)

## 2020-12-15 LAB — TISSUE TRANSGLUTAMINASE, IGA: Tissue Transglutaminase Ab, IgA: <2 U/mL (ref 0–3)

## 2020-12-16 NOTE — Progress Notes (Signed)
NEUROLOGY CONSULTATION NOTE  [name] MRN: [id number] DOB: [birth year]  Referring provider: Abbe Amsterdam, MD Primary care provider: Abbe Amsterdam, MD  Reason for consult:  Multiple sclerosis, migraines  Assessment/Plan:   1.  History of multiple sclerosis - MRI of brain is suspicious although still nonspecific.  Cervical spinal cord without evidence of demyelination.  At this point, I recommend lumbar puncture to evaluate for inflammation in the CSF that would support diagnosis of MS.  Patient is agreeable 2.  Chronic migraine without aura  1.  Check MRI of brain and cervical spine with and without contrast 2.  Following MRI, lumbar puncture checking CSF cell count, protein, glucose, oligoclonal bands, IgG index, gram stain and culture 3.  In addition to Aimovig 140mg , start zonisamide titrating to 100mg  daily.  She also takes venlafaxine XR 75mg  daily for anxiety which may help migraines. 4.  Maxalt and Zofran for migraine rescue 5.  Continue vitamin D 6.  Follow up after testing.   Subjective:  Amy Lester is a 50 year old left-handed female with HTN, type 2 diabetes, HLD, hypothyroidism and chronic migraines who presents for migraine and multiple sclerosis.  History supplemented by prior neurologist's notes.  She reports that she was diagnosed with multiple sclerosis around 2020 where she participated in a study at [place].  She has no available medical records regarding specifics of her diagnosis and treatment.  She presented with generalized muscle weakness, hemisensory loss, needed a wheelchair.  She was diagnosed with MRI and LP.  She was on several medications but does not know what they were.  She moved to [facility] in 2020 and established care with neurologist, Dr. Michelleside.  MRI of brain with and without contrast on 03/16/2019 showed scattered T2/FLAIR hyperintense foci in the bilateral cerebral hemispheres which appeared nonspecific.  MRI of cervical spine with  and without contrast same day showed some disc protrusions but no abnormality of the cervical spinal cord.  Based on these nonspecific findings, and because patient declined lumbar puncture, definite diagnosis of multiple sclerosis could not be established.  She is ambulatory but feels weak in extremities.  She has fallen twice.  She sometimes has trouble with fine-finger movements. Symptoms fluctuate, on for two to three months and improves.  When symptoms improve, never at previous baseline.  It occurs more than once a year.  Left foot reconstruction -   She has had migraines since the mid-1990s.  They are severe frontal/vertex pounding/shooting headache.  Associated nausea, photophobia, phonophobia and sometimes vomits.  They usually last all day and occur 4-5 days a week (they were daily prior to Aimovig).  Current NSAIDS/analgesics:  Ibuprofen, naproxen Current triptans:  Maxalt 10mg  Current ergotamine:  none Current anti-emetic:  Zofran Current muscle relaxants:  none Current Antihypertensive medications:  Lopressor, lisinopril Current Antidepressant medications:  Venlafaxine XR 75mg  QD (anxiety) Current Anticonvulsant medications:  none Current anti-CGRP:  Aimovig 140mg  Q30d Current Vitamins/Herbal/Supplements:  Magnesium, riboflavin,  melatonin, B12 Current Antihistamines/Decongestants:  Zyrtec Other therapy:  none Hormone/birth control:  Estradiol, levothyroxine   Past NSAIDS/analgesics:  Ketorolac 10mg  Past abortive triptans:  Frova, sumatriptan Past abortive ergotamine:  none Past muscle relaxants:  none Past anti-emetic:  none Past antihypertensive medications:  HCTZ Past antidepressant medications:  amitriptyline Past anticonvulsant medications:  Topiramate (upset stomach), gabapentin Past anti-CGRP:  none Past vitamins/Herbal/Supplements:  none Past antihistamines/decongestants:  none Other past therapies:  None.  Does not want to try Botox.    PAST MEDICAL  HISTORY: Past Medical History:  Diagnosis Date  . Asthma   . Blood transfusion without reported diagnosis   . Chronic migraine   . Common migraine with intractable migraine 02/16/2019  . Diabetes mellitus without complication (HCC)   . Endometriosis   . Fatty liver   . GERD (gastroesophageal reflux disease)   . Hyperlipidemia   . Hypertension   . Hypothyroidism   . Incontinence   . Irregular heartbeat   . Multiple sclerosis (HCC)   . Palpitations   . Postconcussive syndrome 05/09/2020  . Pre-diabetes   . Sleep apnea     PAST SURGICAL HISTORY: Past Surgical History:  Procedure Laterality Date  . ABDOMINAL HYSTERECTOMY    . CESAREAN SECTION     x1  . CHOLECYSTECTOMY    . COLONOSCOPY WITH PROPOFOL N/A 11/04/2020   Surgeon: Corbin Ade, MD;  Entirely normal exam.  Repeat in 5 years.  . ENDOMETRIAL ABLATION    . ESOPHAGOGASTRODUODENOSCOPY (EGD) WITH PROPOFOL N/A 11/04/2020   Surgeon: Corbin Ade, MD;  Entirely normal exam.  . FOOT SURGERY Left 06/2018   foot reconstruction  . REPLACEMENT TOTAL KNEE Left     MEDICATIONS: Current Outpatient Medications on File Prior to Visit  Medication Sig Dispense Refill  . acetaminophen (TYLENOL) 500 MG tablet Take 500 mg by mouth every 6 (six) hours as needed for moderate pain.    Marland Kitchen albuterol (VENTOLIN HFA) 108 (90 Base) MCG/ACT inhaler Inhale 3 puffs into the lungs every 6 (six) hours as needed for wheezing or shortness of breath. (Patient taking differently: Inhale 1-3 puffs into the lungs every 6 (six) hours as needed for wheezing or shortness of breath.) 3 g 1  . calcium carbonate (TUMS - DOSED IN MG ELEMENTAL CALCIUM) 500 MG chewable tablet Chew 2-3 tablets by mouth daily as needed for indigestion or heartburn.    . cetirizine (ZYRTEC) 10 MG tablet Take 1 tablet (10 mg total) by mouth daily. 90 tablet 0  . clotrimazole-betamethasone (LOTRISONE) cream Apply 1 application topically as needed. (Patient taking differently: Apply 1  application topically as needed (Outbreak).) 45 g 2  . Cyanocobalamin (B-12 COMPLIANCE INJECTION IJ) Inject 1 Dose as directed every 30 (thirty) days.    Marland Kitchen dicyclomine (BENTYL) 10 MG capsule Take 1 capsule (10 mg total) by mouth 4 (four) times daily -  before meals and at bedtime. 90 capsule 1  . Erenumab-aooe (AIMOVIG, 140 MG DOSE,) 70 MG/ML SOAJ Inject 140 mg into the skin every 30 (thirty) days. (Patient taking differently: Inject 140 mg into the skin every 28 (twenty-eight) days.) 1.12 mL 5  . estradiol (EVAMIST) 1.53 MG/SPRAY transdermal spray Place 3 sprays onto the skin daily.    Marland Kitchen ezetimibe (ZETIA) 10 MG tablet Take 10 mg by mouth daily.     Marland Kitchen FERROUS SULFATE PO Take by mouth daily.    Marland Kitchen glipiZIDE (GLUCOTROL) 10 MG tablet Take 10 mg by mouth daily.     Marland Kitchen ibuprofen (ADVIL) 200 MG tablet Take 600-800 mg by mouth every 6 (six) hours as needed for mild pain or moderate pain.    Marland Kitchen insulin degludec (TRESIBA FLEXTOUCH) 100 UNIT/ML FlexTouch Pen Inject 20 units under the skin twice daily. (Patient taking differently: Inject 20 Units into the skin 2 (two) times daily.) 15 mL 0  . levothyroxine (SYNTHROID) 100 MCG tablet Take 100 mcg by mouth See admin instructions. Take with the to equal 225 mcg, 6 days per week. Skip Sundays    .  lisinopril (ZESTRIL) 5 MG tablet Take 1 tablet (5 mg total) by mouth daily. 90 tablet 1  . MAGNESIUM OXIDE PO Take 1 tablet by mouth daily.    Marland Kitchen MELATONIN PO Take 1 capsule by mouth at bedtime as needed (sleep).    . metFORMIN (GLUCOPHAGE) 1000 MG tablet Take 1,000 mg by mouth 2 (two) times daily.    . metoprolol tartrate (LOPRESSOR) 25 MG tablet Take 25 mg by mouth in the morning.    . metoprolol tartrate (LOPRESSOR) 50 MG tablet Take 50 mg by mouth at bedtime.    . metroNIDAZOLE (METROGEL) 1 % gel Apply 1 application topically at bedtime.     . montelukast (SINGULAIR) 10 MG tablet Take 10 mg by mouth every morning.    . Multiple Vitamins-Minerals (MULTIVITAMIN  WITH MINERALS) tablet Take 1 tablet by mouth as needed.    . naproxen (NAPROSYN) 500 MG tablet Take 1 tablet (500 mg total) by mouth 2 (two) times daily with a meal. 30 tablet 0  . nystatin cream (MYCOSTATIN) Apply 1 application topically daily as needed for dry skin.    Marland Kitchen Olopatadine HCl (PAZEO OP) Place 1 drop into both eyes daily.    Marland Kitchen omeprazole (PRILOSEC) 40 MG capsule Take 1 capsule (40 mg total) by mouth 2 (two) times daily before a meal. 60 capsule 3  . ondansetron (ZOFRAN) 4 MG tablet Take 1 tablet (4 mg total) by mouth every 8 (eight) hours as needed for nausea or vomiting. 20 tablet 1  . ondansetron (ZOFRAN) 8 MG tablet Take 1 tablet (8 mg total) by mouth every 8 (eight) hours as needed for nausea or vomiting. 20 tablet 1  . Red Yeast Rice Extract (RED YEAST RICE PO) Take 1 tablet by mouth daily.     . Riboflavin (VITAMIN B-2 PO) Take 1 tablet by mouth daily.     . rizatriptan (MAXALT) 10 MG tablet Take 1 tablet (10 mg total) by mouth 3 (three) times daily as needed for migraine. 10 tablet 5  . Semaglutide, 1 MG/DOSE, (OZEMPIC, 1 MG/DOSE,) 2 MG/1.5ML SOPN Inject 1 mg into the skin once a week. (Patient taking differently: Inject 1 mg into the skin every Sunday.) 4.5 mL 1  . SYNTHROID 125 MCG tablet Take two tablets (250mg  total) by mouth days 1-6 and skip day 7. (Patient taking differently: Take 125 mcg by mouth See admin instructions. Take with 100 mcg to equal 225 mcg, 6 days per week. Skip Sundays) 180 tablet 1  . Tretinoin, Facial Wrinkles, (RENOVA) 0.02 % CREA Apply 1 application topically at bedtime.    . triamcinolone cream (KENALOG) 0.1 % Apply 1 application topically 2 (two) times daily as needed. (Patient taking differently: Apply 1 application topically 2 (two) times daily as needed (rash).) 45 g 0  . triamterene-hydrochlorothiazide (MAXZIDE-25) 37.5-25 MG tablet Take 1 tablet by mouth daily.    06-13-1992 venlafaxine XR (EFFEXOR XR) 75 MG 24 hr capsule Take 1 capsule (75 mg total) by  mouth daily with breakfast. 30 capsule 5   No current facility-administered medications on file prior to visit.    ALLERGIES: Allergies  Allergen Reactions  . Crestor [Rosuvastatin]     myalgia  . Lipitor [Atorvastatin]     myalgia  . Percocet [Oxycodone-Acetaminophen] Nausea And Vomiting  . Zocor [Simvastatin]     myalgia    FAMILY HISTORY: Family History  Problem Relation Age of Onset  . Heart attack Mother   . Stroke Mother   . Cancer  Mother        breast cancer  . Hyperlipidemia Mother   . Hypothyroidism Mother   . Cancer Father        lymph nodes, liver cancer  . Colon cancer Father        early 3140s  . Other Daughter        Fine Gold Type II Syndrome and cognetive issues  . Memory loss Daughter   . Other Son        Fine Gold Syndrome and BPES  . Diabetes Son 22       IDDM  . Hypothyroidism Sister   . Cancer Sister   . Hyperthyroidism Neg Hx     Objective:  Blood pressure 134/89, pulse 85, resp. rate 20, height 5\' 4"  (1.626 m), weight 221 lb (100.2 kg), SpO2 97 %. General: No acute distress.  Patient appears well-groomed.   Head:  Normocephalic/atraumatic Eyes:  fundi examined but not visualized Neck: supple, no paraspinal tenderness, full range of motion Back: No paraspinal tenderness Heart: regular rate and rhythm Lungs: Clear to auscultation bilaterally. Vascular: No carotid bruits. Neurological Exam: Mental status: alert and oriented to person, place, and time, recent and remote memory intact, fund of knowledge intact, attention and concentration intact, speech fluent and not dysarthric, language intact. Cranial nerves: CN I: not tested CN II: pupils equal, round and reactive to light, visual fields intact CN III, IV, VI:  full range of motion, no nystagmus, no ptosis CN V: facial sensation intact. CN VII: upper and lower face symmetric CN VIII: hearing intact CN IX, X: gag intact, uvula midline CN XI: sternocleidomastoid and trapezius muscles  intact CN XII: tongue midline Bulk & Tone: normal, no fasciculations. Motor:  muscle strength 4/5 left hand grip, otherwise 5/5 throughout Sensation:  Pinprick sensation reduced in left foot, vibratory sensation reduced in right foot. Deep Tendon Reflexes:  2+ throughout,  toes downgoing.   Finger to nose testing:  Without dysmetria.   Heel to shin:  Without dysmetria.   Gait:  Steady, mild limp.  Mildly unsteady tandem walk .  Romberg negative.    Thank you for allowing me to take part in the care of this patient.  Shon MilletAdam Jolie Strohecker, DO  CC:  Abbe AmsterdamShannon Banks, MD

## 2020-12-17 ENCOUNTER — Telehealth: Payer: Self-pay | Admitting: *Deleted

## 2020-12-17 ENCOUNTER — Other Ambulatory Visit: Payer: Self-pay

## 2020-12-17 ENCOUNTER — Encounter: Payer: Self-pay | Admitting: Neurology

## 2020-12-17 ENCOUNTER — Ambulatory Visit (INDEPENDENT_AMBULATORY_CARE_PROVIDER_SITE_OTHER): Payer: Self-pay | Admitting: Neurology

## 2020-12-17 ENCOUNTER — Ambulatory Visit (INDEPENDENT_AMBULATORY_CARE_PROVIDER_SITE_OTHER): Payer: Self-pay | Admitting: *Deleted

## 2020-12-17 VITALS — BP 134/89 | HR 85 | Resp 20 | Ht 64.0 in | Wt 221.0 lb

## 2020-12-17 DIAGNOSIS — G43709 Chronic migraine without aura, not intractable, without status migrainosus: Secondary | ICD-10-CM

## 2020-12-17 DIAGNOSIS — G35 Multiple sclerosis: Secondary | ICD-10-CM

## 2020-12-17 DIAGNOSIS — E538 Deficiency of other specified B group vitamins: Secondary | ICD-10-CM

## 2020-12-17 MED ORDER — ZONISAMIDE 25 MG PO CAPS: ORAL_CAPSULE | ORAL | 0 refills | Status: DC

## 2020-12-17 MED ORDER — CYANOCOBALAMIN 1000 MCG/ML IJ SOLN
1000.0000 ug | Freq: Once | INTRAMUSCULAR | Status: AC
  Administered 2020-12-17: 1000 ug via INTRAMUSCULAR

## 2020-12-17 NOTE — Telephone Encounter (Signed)
Patient came in as a nurse's visit today for a B12 injection.  I do not see a note of approval or labs in order for the injection to be performed and Dr Caryl Never was asked to review the chart as PCP is out of the office.  Dr Caryl Never approved the injection today.  Message sent to PCP to confirm how often the pt is to receive the injection and order labs as needed.

## 2020-12-17 NOTE — Patient Instructions (Addendum)
1.  In addition to Aimovig, start zonisamide as directed. 2.  MRI of brain and cervical spine with and without contrast 3.  Following MRI, order spinal tap checking CSF cell count, protein, glucose, gram stain and culture, oligoclonal bands, IgG index. 4.  Follow up after testing.   We have sent a referral to [facility] for your MRI and they will call you directly to schedule your appointment. They are located at [address] [facility]. If you need to contact them directly please call [phone].

## 2020-12-17 NOTE — Progress Notes (Signed)
Per orders of Dr. Caryl Never, injection of Cyaocobalamin given by Johnella Moloney. Patient tolerated injection well.  Message sent to PCP to confirm dosing schedule and lab orders.

## 2020-12-25 ENCOUNTER — Encounter (HOSPITAL_COMMUNITY): Payer: Self-pay | Admitting: Internal Medicine

## 2020-12-31 ENCOUNTER — Ambulatory Visit: Payer: Medicaid Other | Admitting: Endocrinology

## 2021-01-08 ENCOUNTER — Ambulatory Visit (HOSPITAL_COMMUNITY)
Admission: RE | Admit: 2021-01-08 | Discharge: 2021-01-08 | Disposition: A | Discharge status: Home / Self Care | Payer: Self-pay | Source: Ambulatory Visit | Attending: Family Medicine | Admitting: Family Medicine

## 2021-01-08 DIAGNOSIS — Z13820 Encounter for screening for osteoporosis: Secondary | ICD-10-CM | POA: Insufficient documentation

## 2021-01-10 ENCOUNTER — Other Ambulatory Visit: Payer: Self-pay

## 2021-01-13 ENCOUNTER — Encounter: Payer: Self-pay | Admitting: Family Medicine

## 2021-01-13 ENCOUNTER — Other Ambulatory Visit: Payer: Self-pay

## 2021-01-13 ENCOUNTER — Encounter: Payer: Medicaid Other | Admitting: Family Medicine

## 2021-01-13 ENCOUNTER — Ambulatory Visit (INDEPENDENT_AMBULATORY_CARE_PROVIDER_SITE_OTHER): Payer: Medicaid Other | Admitting: Family Medicine

## 2021-01-13 VITALS — BP 122/80 | HR 69 | Temp 98.1°F | Ht 64.0 in | Wt 226.4 lb

## 2021-01-13 DIAGNOSIS — Z0000 Encounter for general adult medical examination without abnormal findings: Secondary | ICD-10-CM

## 2021-01-13 DIAGNOSIS — E1169 Type 2 diabetes mellitus with other specified complication: Secondary | ICD-10-CM

## 2021-01-13 DIAGNOSIS — I10 Essential (primary) hypertension: Secondary | ICD-10-CM

## 2021-01-13 DIAGNOSIS — E785 Hyperlipidemia, unspecified: Secondary | ICD-10-CM

## 2021-01-13 DIAGNOSIS — M25572 Pain in left ankle and joints of left foot: Secondary | ICD-10-CM

## 2021-01-13 DIAGNOSIS — K219 Gastro-esophageal reflux disease without esophagitis: Secondary | ICD-10-CM

## 2021-01-13 DIAGNOSIS — R7989 Other specified abnormal findings of blood chemistry: Secondary | ICD-10-CM

## 2021-01-13 DIAGNOSIS — G35 Multiple sclerosis: Secondary | ICD-10-CM

## 2021-01-13 DIAGNOSIS — E039 Hypothyroidism, unspecified: Secondary | ICD-10-CM

## 2021-01-13 DIAGNOSIS — G8929 Other chronic pain: Secondary | ICD-10-CM

## 2021-01-13 LAB — COMPREHENSIVE METABOLIC PANEL
ALT: 30 U/L (ref 0–35)
AST: 22 U/L (ref 0–37)
Albumin: 4.6 g/dL (ref 3.5–5.2)
Alkaline Phosphatase: 138 U/L — ABNORMAL HIGH (ref 39–117)
BUN: 16 mg/dL (ref 6–23)
CO2: 23 mEq/L (ref 19–32)
Calcium: 9.5 mg/dL (ref 8.4–10.5)
Chloride: 102 mEq/L (ref 96–112)
Creatinine, Ser: 0.73 mg/dL (ref 0.40–1.20)
GFR: 95.13 mL/min (ref >60.00)
Glucose, Bld: 229 mg/dL — ABNORMAL HIGH (ref 70–99)
Potassium: 4.2 mEq/L (ref 3.5–5.1)
Sodium: 136 mEq/L (ref 135–145)
Total Bilirubin: 0.4 mg/dL (ref 0.2–1.2)
Total Protein: 8.5 g/dL — ABNORMAL HIGH (ref 6.0–8.3)

## 2021-01-13 LAB — LIPID PANEL
Cholesterol: 277 mg/dL — ABNORMAL HIGH (ref 0–200)
HDL: 59.4 mg/dL (ref >39.00)
LDL Cholesterol: 178 mg/dL — ABNORMAL HIGH (ref 0–99)
NonHDL: 217.12
Total CHOL/HDL Ratio: 5
Triglycerides: 196 mg/dL — ABNORMAL HIGH (ref 0.0–149.0)
VLDL: 39.2 mg/dL (ref 0.0–40.0)

## 2021-01-13 LAB — VITAMIN B12: Vitamin B-12: 298 pg/mL (ref 211–911)

## 2021-01-13 LAB — CBC WITH DIFFERENTIAL/PLATELET
Basophils Absolute: 0 10*3/uL (ref 0.0–0.1)
Basophils Relative: 0.4 % (ref 0.0–3.0)
Eosinophils Absolute: 0.2 10*3/uL (ref 0.0–0.7)
Eosinophils Relative: 2.7 % (ref 0.0–5.0)
HCT: 43.2 % (ref 36.0–46.0)
Hemoglobin: 14.7 g/dL (ref 12.0–15.0)
Lymphocytes Relative: 22.9 % (ref 12.0–46.0)
Lymphs Abs: 1.8 10*3/uL (ref 0.7–4.0)
MCHC: 34 g/dL (ref 30.0–36.0)
MCV: 87.3 fl (ref 78.0–100.0)
Monocytes Absolute: 0.3 10*3/uL (ref 0.1–1.0)
Monocytes Relative: 4.2 % (ref 3.0–12.0)
Neutro Abs: 5.5 10*3/uL (ref 1.4–7.7)
Neutrophils Relative %: 69.8 % (ref 43.0–77.0)
Platelets: 293 10*3/uL (ref 150.0–400.0)
RBC: 4.94 Mil/uL (ref 3.87–5.11)
RDW: 13.1 % (ref 11.5–15.5)
WBC: 7.9 10*3/uL (ref 4.0–10.5)

## 2021-01-13 LAB — TSH: TSH: 9.47 u[IU]/mL — ABNORMAL HIGH (ref 0.35–4.50)

## 2021-01-13 LAB — VITAMIN D 25 HYDROXY (VIT D DEFICIENCY, FRACTURES): VITD: 22.71 ng/mL — ABNORMAL LOW (ref 30.00–100.00)

## 2021-01-13 LAB — HEMOGLOBIN A1C: Hgb A1c MFr Bld: 7.6 % — ABNORMAL HIGH (ref 4.6–6.5)

## 2021-01-13 MED ORDER — EZETIMIBE 10 MG PO TABS: 10.0000 mg | ORAL_TABLET | Freq: Every day | ORAL | 3 refills | Status: DC

## 2021-01-13 NOTE — Progress Notes (Signed)
Subjective:     Amy Lester is a 50 y.o. female and is here for a comprehensive physical exam. The patient reports doing okay.  Noticing left ankle edema and sensation that it may give out at times when walking.  Patient endorses a history of prior surgery on left ankle.  Previously seen by Ortho.  Edema noted mostly at the end of the day.  Patient also with a left thigh numbness.  Seen by neurology.  Has upcoming imaging to assess for progression of MS.  Patient notes weight gain has snacking more.  States blood sugar has been elevated in the 200s 2/2 not watching her diet.  Taking [place].  Requesting refill on Zofran.  Social History   Socioeconomic History  . Marital status: Married    Spouse name: Not on file  . Number of children: Not on file  . Years of education: 79  . Highest education level: Not on file  Occupational History  . Not on file  Tobacco Use  . Smoking status: Never Smoker  . Smokeless tobacco: Never Used  Vaping Use  . Vaping Use: Never used  Substance and Sexual Activity  . Alcohol use: Yes    Comment: once a year  . Drug use: Never  . Sexual activity: Not on file  Other Topics Concern  . Not on file  Social History Narrative   Left handed    Caffeine 1 daily    Lives at home with husband and adult daughter   One story home   Social Determinants of Health   Financial Resource Strain: Not on file  Food Insecurity: Not on file  Transportation Needs: Not on file  Physical Activity: Not on file  Stress: Not on file  Social Connections: Not on file  Intimate Partner Violence: Not on file   Health Maintenance  Topic Date Due  . PNEUMOCOCCAL POLYSACCHARIDE VACCINE AGE 71-64 HIGH RISK  Never done  . COVID-19 Vaccine (1) Never done  . HIV Screening  Never done  . TETANUS/TDAP  Never done  . PAP SMEAR-Modifier  Never done  . FOOT EXAM  07/23/2020  . OPHTHALMOLOGY EXAM  02/05/2021  . INFLUENZA VACCINE  04/07/2021  . HEMOGLOBIN A1C  04/28/2021  .  MAMMOGRAM  11/20/2022  . COLONOSCOPY (Pts 45-71yrs Insurance coverage will need to be confirmed)  11/04/2030  . Hepatitis C Screening  Completed  . HPV VACCINES  Aged Out    The following portions of the patient's history were reviewed and updated as appropriate: allergies, current medications, past family history, past medical history, past social history, past surgical history and problem list.  Review of Systems Pertinent items noted in HPI and remainder of comprehensive ROS otherwise negative.   Objective:    BP 122/80 (BP Location: Right Arm, Patient Position: Sitting, Cuff Size: Normal)   Pulse 69   Temp 98.1 F (36.7 C) (Oral)   Ht 5\' 4"  (1.626 m)   Wt 226 lb 6.4 oz (102.7 kg)   SpO2 97%   BMI 38.86 kg/m  General appearance: alert, cooperative and no distress Head: Normocephalic, without obvious abnormality, atraumatic Eyes: conjunctivae/corneas clear. PERRL, EOM's intact. Fundi benign. Ears: normal TM's and external ear canals both ears Nose: Nares normal. Septum midline. Mucosa normal. No drainage or sinus tenderness. Throat: lips, mucosa, and tongue normal; teeth and gums normal Neck: no adenopathy, no carotid bruit, no JVD, supple, symmetrical, trachea midline and thyroid not enlarged, symmetric, no tenderness/mass/nodules Lungs: clear to auscultation bilaterally  Heart: regular rate and rhythm, S1, S2 normal, no murmur, click, rub or gallop Abdomen: soft, non-tender; bowel sounds normal; no masses,  no organomegaly Extremities: extremities normal, atraumatic, no cyanosis or edema Pulses: 2+ and symmetric Skin: Skin color, texture, turgor normal. No rashes or lesions Lymph nodes: Cervical, supraclavicular, and axillary nodes normal. Neurologic: Alert and oriented X 3, normal strength and tone. Normal symmetric reflexes. Normal coordination and gait    Diabetic Foot Exam - Simple   Simple Foot Form Diabetic Foot exam was performed with the following findings: Yes  01/13/2021  9:51 AM  Visual Inspection No deformities, no ulcerations, no other skin breakdown bilaterally: Yes Sensation Testing Intact to touch and monofilament testing bilaterally: Yes Pulse Check Posterior Tibialis and Dorsalis pulse intact bilaterally: Yes Comments Mildlly diminished monofilament sensation on L foot.       Assessment:    Healthy female exam.      Plan:     Anticipatory guidance given including wearing seatbelts, smoke detectors in the home, increasing physical activity, increasing p.o. intake of water and vegetables. -will obtain labs -Mammogram done 11/19/2020 -Colonoscopy done 11/04/2020 -Bone density done 01/08/2021 -Given handout -Next CPE in 1 year See After Visit Summary for Counseling Recommendations    MS (multiple sclerosis) (HCC) -Stable -MRI brain and cervical spine with/without contrast scheduled for 01/21/2021 -Spinal tap planned after MRI -Continue follow-up with neurology, Dr. Everlena Cooper  Type 2 diabetes mellitus with other specified complication, without long-term current use of insulin (HCC) -Discussed the importance of lifestyle modifications including decreasing snacking, increasing physical activity -Continue current medications including Tresiba 20 units, metformin 500 mg twice daily, Ozempic 0.5 mg weekly -Continue follow-up with nutrition -Continue follow-up with Dr. Lucianne Muss - Plan: Hemoglobin A1c, Microalbumin/Creatinine Ratio, Urine  Hypothyroidism, unspecified type  -Continue Synthroid 100 mcg -Plan: TSH  Gastroesophageal reflux disease, unspecified whether esophagitis present -Continue omeprazole 40 mg twice daily -Lifestyle modifications -Continue follow-up with GI - Plan: Vitamin B12  Elevated LFTs -Prior history of?  Patient requesting labs. -LFTs normal on 12/13/2020 -Continue follow-up with GI - Plan: Comprehensive metabolic panel  Hyperlipidemia, unspecified hyperlipidemia type - Plan: Lipid panel, ezetimibe (ZETIA) 10 MG  tablet  Essential hypertension -Controlled -Continue current meds -Lifestyle modifications -Continue follow-up with cardiology  Plan: CBC with Differential/Platelet, Comprehensive metabolic panel  Chronic pain of left ankle -Continue treatment of symptoms -Discussed stretching exercises -Follow-up with Ortho   Follow-up as needed 3-4 months  Abbe Amsterdam, MD

## 2021-01-13 NOTE — Patient Instructions (Addendum)
It appears that the prescription for Zofran was recently filled by Flossie Dibble, [facility]  Preventive Care 50-52 Years Old, Female Preventive care refers to lifestyle choices and visits with your health care provider that can promote health and wellness. This includes:  A yearly physical exam. This is also called an annual wellness visit.  Regular dental and eye exams.  Immunizations.  Screening for certain conditions.  Healthy lifestyle choices, such as: ? Eating a healthy diet. ? Getting regular exercise. ? Not using drugs or products that contain nicotine and tobacco. ? Limiting alcohol use. What can I expect for my preventive care visit? Physical exam Your health care provider will check your:  Height and weight. These may be used to calculate your BMI (body mass index). BMI is a measurement that tells if you are at a healthy weight.  Heart rate and blood pressure.  Body temperature.  Skin for abnormal spots. Counseling Your health care provider may ask you questions about your:  Past medical problems.  Family's medical history.  Alcohol, tobacco, and drug use.  Emotional well-being.  Home life and relationship well-being.  Sexual activity.  Diet, exercise, and sleep habits.  Work and work Statistician.  Access to firearms.  Method of birth control.  Menstrual cycle.  Pregnancy history. What immunizations do I need? Vaccines are usually given at various ages, according to a schedule. Your health care provider will recommend vaccines for you based on your age, medical history, and lifestyle or other factors, such as travel or where you work.   What tests do I need? Blood tests  Lipid and cholesterol levels. These may be checked every 5 years, or more often if you are over 52 years old.  Hepatitis C test.  Hepatitis B test. Screening  Lung cancer screening. You may have this screening every year starting at age 3 if you have a  30-pack-year history of smoking and currently smoke or have quit within the past 15 years.  Colorectal cancer screening. ? All adults should have this screening starting at age 67 and continuing until age 36. ? Your health care provider may recommend screening at age 60 if you are at increased risk. ? You will have tests every 1-10 years, depending on your results and the type of screening test.  Diabetes screening. ? This is done by checking your blood sugar (glucose) after you have not eaten for a while (fasting). ? You may have this done every 1-3 years.  Mammogram. ? This may be done every 1-2 years. ? Talk with your health care provider about when you should start having regular mammograms. This may depend on whether you have a family history of breast cancer.  BRCA-related cancer screening. This may be done if you have a family history of breast, ovarian, tubal, or peritoneal cancers.  Pelvic exam and Pap test. ? This may be done every 3 years starting at age 21. ? Starting at age 52, this may be done every 5 years if you have a Pap test in combination with an HPV test. Other tests  STD (sexually transmitted disease) testing, if you are at risk.  Bone density scan. This is done to screen for osteoporosis. You may have this scan if you are at high risk for osteoporosis. Talk with your health care provider about your test results, treatment options, and if necessary, the need for more tests. Follow these instructions at home: Eating and drinking  Eat a diet that includes fresh  fruits and vegetables, whole grains, lean protein, and low-fat dairy products.  Take vitamin and mineral supplements as recommended by your health care provider.  Do not drink alcohol if: ? Your health care provider tells you not to drink. ? You are pregnant, may be pregnant, or are planning to become pregnant.  If you drink alcohol: ? Limit how much you have to 0-1 drink a day. ? Be aware of how much  alcohol is in your drink. In the [place] one drink equals one 12 oz bottle of beer (355 mL), one 5 oz glass of wine (148 mL), or one 1 oz glass of hard liquor (44 mL).   Lifestyle  Take daily care of your teeth and gums. Brush your teeth every morning and night with fluoride toothpaste. Floss one time each day.  Stay active. Exercise for at least 30 minutes 5 or more days each week.  Do not use any products that contain nicotine or tobacco, such as cigarettes, e-cigarettes, and chewing tobacco. If you need help quitting, ask your health care provider.  Do not use drugs.  If you are sexually active, practice safe sex. Use a condom or other form of protection to prevent STIs (sexually transmitted infections).  If you do not wish to become pregnant, use a form of birth control. If you plan to become pregnant, see your health care provider for a prepregnancy visit.  If told by your health care provider, take low-dose aspirin daily starting at age 30.  Find healthy ways to cope with stress, such as: ? Meditation, yoga, or listening to music. ? Journaling. ? Talking to a trusted person. ? Spending time with friends and family. Safety  Always wear your seat belt while driving or riding in a vehicle.  Do not drive: ? If you have been drinking alcohol. Do not ride with someone who has been drinking. ? When you are tired or distracted. ? While texting.  Wear a helmet and other protective equipment during sports activities.  If you have firearms in your house, make sure you follow all gun safety procedures. What's next?  Visit your health care provider once a year for an annual wellness visit.  Ask your health care provider how often you should have your eyes and teeth checked.  Stay up to date on all vaccines. This information is not intended to replace advice given to you by your health care provider. Make sure you discuss any questions you have with your health care  provider. Document Revised: 05/28/2020 Document Reviewed: 05/05/2018 Elsevier Patient Education  2021 [place].  Diabetes Mellitus and Exercise Exercising regularly is important for overall health, especially for people who have diabetes mellitus. Exercising is not only about losing weight. It has many other health benefits, such as increasing muscle strength and bone density and reducing body fat and stress. This leads to improved fitness, flexibility, and endurance, all of which result in better overall health. What are the benefits of exercise if I have diabetes? Exercise has many benefits for people with diabetes. They include:  Helping to lower and control blood sugar (glucose).  Helping the body to respond better to the hormone insulin by improving insulin sensitivity.  Reducing how much insulin the body needs.  Lowering the risk for heart disease by: ? Lowering "bad" cholesterol and triglyceride levels. ? Increasing "good" cholesterol levels. ? Lowering blood pressure. ? Lowering blood glucose levels. What is my activity plan? Your health care provider or certified diabetes educator can  help you make a plan for the type and frequency of exercise that works for you. This is called your activity plan. Be sure to:  Get at least 150 minutes of medium-intensity or high-intensity exercise each week. Exercises may include brisk walking, biking, or water aerobics.  Do stretching and strengthening exercises, such as yoga or weight lifting, at least 2 times a week.  Spread out your activity over at least 3 days of the week.  Get some form of physical activity each day. ? Do not go more than 2 days in a row without some kind of physical activity. ? Avoid being inactive for more than 90 minutes at a time. Take frequent breaks to walk or stretch.  Choose exercises or activities that you enjoy. Set realistic goals.  Start slowly and gradually increase your exercise intensity over  time.   How do I manage my diabetes during exercise? Monitor your blood glucose  Check your blood glucose before and after exercising. If your blood glucose is: ? 240 mg/dL (13.3 mmol/L) or higher before you exercise, check your urine for ketones. These are chemicals created by the liver. If you have ketones in your urine, do not exercise until your blood glucose returns to normal. ? 100 mg/dL (5.6 mmol/L) or lower, eat a snack containing 15-20 grams of carbohydrate. Check your blood glucose 15 minutes after the snack to make sure that your glucose level is above 100 mg/dL (5.6 mmol/L) before you start your exercise.  Know the symptoms of low blood glucose (hypoglycemia) and how to treat it. Your risk for hypoglycemia increases during and after exercise. Follow these tips and your health care provider's instructions  Keep a carbohydrate snack that is fast-acting for use before, during, and after exercise to help prevent or treat hypoglycemia.  Avoid injecting insulin into areas of the body that are going to be exercised. For example, avoid injecting insulin into: ? Your arms, when you are about to play tennis. ? Your legs, when you are about to go jogging.  Keep records of your exercise habits. Doing this can help you and your health care provider adjust your diabetes management plan as needed. Write down: ? Food that you eat before and after you exercise. ? Blood glucose levels before and after you exercise. ? The type and amount of exercise you have done.  Work with your health care provider when you start a new exercise or activity. He or she may need to: ? Make sure that the activity is safe for you. ? Adjust your insulin, other medicines, and food that you eat.  Drink plenty of water while you exercise. This prevents loss of water (dehydration) and problems caused by a lot of heat in the body (heat stroke).   Where to find more information  American Diabetes Association:  www.diabetes.org Summary  Exercising regularly is important for overall health, especially for people who have diabetes mellitus.  Exercising has many health benefits. It increases muscle strength and bone density and reduces body fat and stress. It also lowers and controls blood glucose.  Your health care provider or certified diabetes educator can help you make an activity plan for the type and frequency of exercise that works for you.  Work with your health care provider to make sure any new activity is safe for you. Also work with your health care provider to adjust your insulin, other medicines, and the food you eat. This information is not intended to replace advice given to  you by your health care provider. Make sure you discuss any questions you have with your health care provider. Document Revised: 05/22/2019 Document Reviewed: 05/22/2019 Elsevier Patient Education  2021 [place].  Diabetes Mellitus and Standards of Medical Care Living with and managing diabetes (diabetes mellitus) can be complicated. Your diabetes treatment may be managed by a team of health care providers, including:  A physician who specializes in diabetes (endocrinologist). You might also have visits with a nurse practitioner or physician assistant.  Nurses.  A registered dietitian.  A certified diabetes care and education specialist.  An exercise specialist.  A pharmacist.  An eye doctor.  A foot specialist (podiatrist).  A dental care provider.  A primary care provider.  A mental health care provider. How to manage your diabetes You can do many things to successfully manage your diabetes. Your health care providers will follow guidelines to help you get the best quality of care. Here are general guidelines for your diabetes management plan. Your health care providers may give you more specific instructions. Physical exams When you are diagnosed with diabetes, and each year after that, your  health care provider will ask about your medical and family history. You will have a physical exam, which may include:  Measuring your height, weight, and body mass index (BMI).  Checking your blood pressure. This will be done at every routine medical visit. Your target blood pressure may vary depending on your medical conditions, your age, and other factors.  A thyroid exam.  A skin exam.  Screening for nerve damage (peripheral neuropathy). This may include checking the pulse in your legs and feet and the level of sensation in your hands and feet.  A foot exam to inspect the structure and skin of your feet, including checking for cuts, bruises, redness, blisters, sores, or other problems.  Screening for blood vessel (vascular) problems. This may include checking the pulse in your legs and feet and checking your temperature. Blood tests Depending on your treatment plan and your personal needs, you may have the following tests:  Hemoglobin A1C (HbA1C). This test provides information about blood sugar (glucose) control over the previous 2-3 months. It is used to adjust your treatment plan, if needed. This test will be done: ? At least 2 times a year, if you are meeting your treatment goals. ? 4 times a year, if you are not meeting your treatment goals or if your goals have changed.  Lipid testing, including total cholesterol, LDL and HDL cholesterol, and triglyceride levels. ? The goal for LDL is less than 100 mg/dL (5.5 mmol/L). If you are at high risk for complications, the goal is less than 70 mg/dL (3.9 mmol/L). ? The goal for HDL is 40 mg/dL (2.2 mmol/L) or higher for men, and 50 mg/dL (2.8 mmol/L) or higher for women. An HDL cholesterol of 60 mg/dL (3.3 mmol/L) or higher gives some protection against heart disease. ? The goal for triglycerides is less than 150 mg/dL (8.3 mmol/L).  Liver function tests.  Kidney function tests.  Thyroid function tests.   Dental and eye  exams  Visit your dentist two times a year.  If you have type 1 diabetes, your health care provider may recommend an eye exam within 5 years after you are diagnosed, and then once a year after your first exam. ? For children with type 1 diabetes, the health care provider may recommend an eye exam when your child is age 65 or older and has had diabetes  for 3-5 years. After the first exam, your child should get an eye exam once a year.  If you have type 2 diabetes, your health care provider may recommend an eye exam as soon as you are diagnosed, and then every 1-2 years after your first exam.   Immunizations  A yearly flu (influenza) vaccine is recommended annually for everyone 6 months or older. This is especially important if you have diabetes.  The pneumonia (pneumococcal) vaccine is recommended for everyone 2 years or older who has diabetes. If you are age 44 or older, you may get the pneumonia vaccine as a series of two separate shots.  The hepatitis B vaccine is recommended for adults shortly after being diagnosed with diabetes. Adults and children with diabetes should receive all other vaccines according to age-specific recommendations from the Centers for Disease Control and Prevention (CDC). Mental and emotional health Screening for symptoms of eating disorders, anxiety, and depression is recommended at the time of diagnosis and after as needed. If your screening shows that you have symptoms, you may need more evaluation. You may work with a mental health care provider. Follow these instructions at home: Treatment plan You will monitor your blood glucose levels and may give yourself insulin. Your treatment plan will be reviewed at every medical visit. You and your health care provider will discuss:  How you are taking your medicines, including insulin.  Any side effects you have.  Your blood glucose level target goals.  How often you monitor your blood glucose level.  Lifestyle  habits, such as activity level and tobacco, alcohol, and substance use. Education Your health care provider will assess how well you are monitoring your blood glucose levels and whether you are taking your insulin and medicines correctly. He or she may refer you to:  A certified diabetes care and education specialist to manage your diabetes throughout your life, starting at diagnosis.  A registered dietitian who can create and review your personal nutrition plan.  An exercise specialist who can discuss your activity level and exercise plan. General instructions  Take over-the-counter and prescription medicines only as told by your health care provider.  Keep all follow-up visits. This is important. Where to find support There are many diabetes support networks, including:  American Diabetes Association (ADA): diabetes.org  Defeat Diabetes Foundation: defeatdiabetes.org Where to find more information  American Diabetes Association (ADA): www.diabetes.org  Association of Diabetes Care & Education Specialists (ADCES): diabeteseducator.org  International Diabetes Federation (IDF): https://www.munoz-bell.org/ Summary  Managing diabetes (diabetes mellitus) can be complicated. Your diabetes treatment may be managed by a team of health care providers.  Your health care providers follow guidelines to help you get the best quality care.  You should have physical exams, blood tests, blood pressure monitoring, immunizations, and screening tests regularly. Stay updated on how to manage your diabetes.  Your health care providers may also give you more specific instructions based on your individual health. This information is not intended to replace advice given to you by your health care provider. Make sure you discuss any questions you have with your health care provider. Document Revised: 02/29/2020 Document Reviewed: 02/29/2020 Elsevier Patient Education  [place].

## 2021-01-14 LAB — MICROALBUMIN / CREATININE URINE RATIO
Creatinine,U: 170.7 mg/dL
Microalb Creat Ratio: 0.9 mg/g (ref 0.0–30.0)
Microalb, Ur: 1.6 mg/dL (ref 0.0–1.9)

## 2021-01-15 ENCOUNTER — Other Ambulatory Visit: Payer: Self-pay | Admitting: Family Medicine

## 2021-01-15 DIAGNOSIS — E559 Vitamin D deficiency, unspecified: Secondary | ICD-10-CM

## 2021-01-15 MED ORDER — VITAMIN D (ERGOCALCIFEROL) 1.25 MG (50000 UNIT) PO CAPS: 50000.0000 [IU] | ORAL_CAPSULE | ORAL | 0 refills | Status: DC

## 2021-01-16 NOTE — Progress Notes (Signed)
Spoke with patient, verbalized understanding. Will call back if needs Rx sent to another pharmacy.

## 2021-01-17 ENCOUNTER — Ambulatory Visit (INDEPENDENT_AMBULATORY_CARE_PROVIDER_SITE_OTHER): Payer: Self-pay

## 2021-01-17 ENCOUNTER — Other Ambulatory Visit: Payer: Self-pay

## 2021-01-17 DIAGNOSIS — E538 Deficiency of other specified B group vitamins: Secondary | ICD-10-CM

## 2021-01-17 MED ORDER — CYANOCOBALAMIN 1000 MCG/ML IJ SOLN
1000.0000 ug | Freq: Once | INTRAMUSCULAR | Status: AC
  Administered 2021-01-17: 1000 ug via INTRAMUSCULAR

## 2021-01-17 NOTE — Progress Notes (Signed)
Per orders of Dr. Banks, injection of Cyanocobalamin 1000 mcg given by Yeray Tomas L Franchon Ketterman. °Patient tolerated injection well.  °

## 2021-01-21 ENCOUNTER — Ambulatory Visit: Payer: Medicaid Other | Admitting: Endocrinology

## 2021-01-21 ENCOUNTER — Other Ambulatory Visit: Payer: Self-pay

## 2021-01-29 ENCOUNTER — Telehealth: Payer: Self-pay

## 2021-01-29 MED ORDER — ZONISAMIDE 100 MG PO CAPS: ORAL_CAPSULE | ORAL | 5 refills | Status: DC

## 2021-01-29 NOTE — Telephone Encounter (Signed)
Message left by pt, Pt needs  A refill on medication. Per pt she did titrate up 100 mg of zonisamide.  Refill sent

## 2021-01-30 ENCOUNTER — Ambulatory Visit: Payer: Medicaid Other | Admitting: Cardiology

## 2021-01-31 ENCOUNTER — Ambulatory Visit: Payer: Self-pay | Admitting: Cardiology

## 2021-02-03 NOTE — Progress Notes (Signed)
Cardiology Office Note  Date: 02/04/2021   ID: Amy Lester, DOB 01-13-1971, MRN [id number]  PCP:  Deeann Saint, MD  Cardiologist:  Dina Rich, MD Electrophysiologist:  None   Chief Complaint: Follow-up palpitations, HTN, HLD  History of Present Illness: Amy Lester is a 50 y.o. female with a history of palpitations, HTN, HLD, DM2, NAFLD, GERD, hypothyroidism, MS, OSA.  Last encounter with Dr. Wyline Mood 04/24/2020.  History of palpitations and seen in the ER in the past with episode of palpitations occurring while laying in bed.  Recent increase in symptoms with heavy heartbeats.  Symptoms lasting about 10 minutes.  Feeling weak all over, occurring few times a week.  Had a previous cardiac monitor 3 years prior.  Event monitor July 2020 no arrhythmias.  Had previously changed to Toprol which gave her fatigue.  She went back on Metoprolol 25 mg a.m., 50 mg p.m.  Still having symptoms at times.  Lasting a few minutes.  She was compliant with her statin medication.  Lipids on 02/2019 : TC 246, HDL 45, TG 28, LDL 163.  She had elevated LFTs and was seeing GI.  Later she was off statin.  Had some lower extremity edema, primarily left side.  Echo 05/28/2019 showed EF greater than 65%, indeterminate diastolic function.  He was compliant with antihypertensive medication.    She was last here at suggestion of an outpatient clinic due to elevated LDL on recent labs.  She stated her LDL was 199.  Previously on statin medications but had elevated LFTs.  Statin was stopped and she was on Zetia only.  History of fatty liver disease and seeing gastroenterology.  Previously intolerant to statins due to statin associated muscle symptoms.  She continued to have some palpitations which were more noticeable at night when  in bed which were short-lived and she had no other symptoms.  She had what she described as weird symptoms in her arms.  She could feel like her heart was beating in her arms  occasionally.  She denied any anginal symptoms or exertional symptoms.  She is limited limited in ability to exercise due to reconstruction of her ankle/foot.  Her diabetes was much better controlled recently with hemoglobin A1c of 6.1% 2 weeks ago.  LFTs on 07/24/2020: AST 21, ALT 24.  She is here for follow-up today.  She states she is pending receipt of Repatha and is to start it as soon as she receives it.  She states she was started on 140 mg subcu every 2 weeks.  She is complaining of recent chest discomfort substernal and under left breast with exertion.  She denies radiation.  She states she does have some mild dizziness associated.  Denies any diaphoresis but states she sweats a lot anyway which she attributes to perimenopausal state.  She is concerned she may have heart disease given significant elevations in her lipid levels and associated risk factors including hypertension, HLD, DM 2.  Recent labs on 01/13/2021 demonstrated elevated glucose of 229, alkaline phosphatase 138, total cholesterol 277, LDL 178, HDL 59.4, triglycerides 196.  TSH of 9.47.,  Hemoglobin A1c 7.6.     Past Medical History:  Diagnosis Date  . Asthma   . Blood transfusion without reported diagnosis   . Chronic migraine   . Common migraine with intractable migraine 02/16/2019  . Diabetes mellitus without complication (HCC)   . Endometriosis   . Fatty liver   . GERD (gastroesophageal reflux disease)   .  Hyperlipidemia   . Hypertension   . Hypothyroidism   . Incontinence   . Irregular heartbeat   . Multiple sclerosis (HCC)   . Palpitations   . Postconcussive syndrome 05/09/2020  . Pre-diabetes   . Sleep apnea     Past Surgical History:  Procedure Laterality Date  . ABDOMINAL HYSTERECTOMY    . CESAREAN SECTION     x1  . CHOLECYSTECTOMY    . COLONOSCOPY WITH PROPOFOL N/A 11/04/2020   Procedure: COLONOSCOPY WITH PROPOFOL;  Surgeon: Corbin Ade, MD;  Location: [facility];  Service: Endoscopy;   Laterality: N/A;  12:45pm  . ENDOMETRIAL ABLATION    . ESOPHAGOGASTRODUODENOSCOPY (EGD) WITH PROPOFOL N/A 11/04/2020   Procedure: ESOPHAGOGASTRODUODENOSCOPY (EGD) WITH PROPOFOL;  Surgeon: Corbin Ade, MD;  Location: [facility];  Service: Endoscopy;  Laterality: N/A;  . FOOT SURGERY Left 06/2018   foot reconstruction  . REPLACEMENT TOTAL KNEE Left     Current Outpatient Medications  Medication Sig Dispense Refill  . acetaminophen (TYLENOL) 500 MG tablet Take 500 mg by mouth every 6 (six) hours as needed for moderate pain.    Marland Kitchen albuterol (VENTOLIN HFA) 108 (90 Base) MCG/ACT inhaler Inhale 3 puffs into the lungs every 6 (six) hours as needed for wheezing or shortness of breath. (Patient taking differently: Inhale 1-3 puffs into the lungs every 6 (six) hours as needed for wheezing or shortness of breath.) 3 g 1  . busPIRone (BUSPAR) 7.5 MG tablet Take 7.5 mg by mouth 2 (two) times daily as needed.    . calcium carbonate (TUMS - DOSED IN MG ELEMENTAL CALCIUM) 500 MG chewable tablet Chew 2-3 tablets by mouth daily as needed for indigestion or heartburn.    . cetirizine (ZYRTEC) 10 MG tablet Take 1 tablet (10 mg total) by mouth daily. 90 tablet 0  . ciprofloxacin (CILOXAN) 0.3 % ophthalmic solution SMARTSIG:In Eye(s)    . clotrimazole-betamethasone (LOTRISONE) cream Apply 1 application topically as needed. (Patient taking differently: Apply 1 application topically as needed (Outbreak).) 45 g 2  . Cyanocobalamin (B-12 COMPLIANCE INJECTION IJ) Inject 1 Dose as directed every 30 (thirty) days.    Marland Kitchen dicyclomine (BENTYL) 10 MG capsule Take 1 capsule (10 mg total) by mouth 4 (four) times daily -  before meals and at bedtime. 90 capsule 1  . Erenumab-aooe (AIMOVIG, 140 MG DOSE,) 70 MG/ML SOAJ Inject 140 mg into the skin every 30 (thirty) days. (Patient taking differently: Inject 140 mg into the skin every 28 (twenty-eight) days.) 1.12 mL 5  . escitalopram (LEXAPRO) 20 MG tablet Take 1 tablet by mouth  daily.    Marland Kitchen estradiol (EVAMIST) 1.53 MG/SPRAY transdermal spray Place 3 sprays onto the skin daily.    Marland Kitchen ezetimibe (ZETIA) 10 MG tablet Take 1 tablet (10 mg total) by mouth daily. 90 tablet 3  . FERROUS SULFATE PO Take by mouth daily.    Marland Kitchen glipiZIDE (GLUCOTROL) 10 MG tablet Take 10 mg by mouth daily.     Marland Kitchen ibuprofen (ADVIL) 200 MG tablet Take 600-800 mg by mouth every 6 (six) hours as needed for mild pain or moderate pain.    Marland Kitchen insulin degludec (TRESIBA FLEXTOUCH) 100 UNIT/ML FlexTouch Pen Inject 20 units under the skin twice daily. (Patient taking differently: Inject 20 Units into the skin 2 (two) times daily.) 15 mL 0  . levothyroxine (SYNTHROID) 100 MCG tablet Take 100 mcg by mouth See admin instructions. Take with the to equal 225 mcg, 6 days per week.  Skip Sundays    . lisinopril (ZESTRIL) 5 MG tablet Take 1 tablet (5 mg total) by mouth daily. 90 tablet 1  . MAGNESIUM OXIDE PO Take 1 tablet by mouth daily.    Marland Kitchen. MELATONIN PO Take 1 capsule by mouth at bedtime as needed (sleep).    . metFORMIN (GLUCOPHAGE) 1000 MG tablet Take 1,000 mg by mouth 2 (two) times daily.    . metroNIDAZOLE (METROGEL) 1 % gel Apply 1 application topically at bedtime.     . montelukast (SINGULAIR) 10 MG tablet Take 10 mg by mouth every morning.    . Multiple Vitamins-Minerals (MULTIVITAMIN WITH MINERALS) tablet Take 1 tablet by mouth as needed.    . naproxen (NAPROSYN) 500 MG tablet Take 1 tablet (500 mg total) by mouth 2 (two) times daily with a meal. 30 tablet 0  . nitroGLYCERIN (NITROSTAT) 0.4 MG SL tablet Place 1 tablet (0.4 mg total) under the tongue every 5 (five) minutes as needed for chest pain. 25 tablet 3  . nystatin cream (MYCOSTATIN) Apply 1 application topically daily as needed for dry skin.    Marland Kitchen. Olopatadine HCl (PAZEO OP) Place 1 drop into both eyes daily.    Marland Kitchen. omeprazole (PRILOSEC) 40 MG capsule Take 1 capsule (40 mg total) by mouth 2 (two) times daily before a meal. 60 capsule 3  . ondansetron  (ZOFRAN) 4 MG tablet Take 1 tablet (4 mg total) by mouth every 8 (eight) hours as needed for nausea or vomiting. 20 tablet 1  . ondansetron (ZOFRAN) 8 MG tablet Take 1 tablet (8 mg total) by mouth every 8 (eight) hours as needed for nausea or vomiting. 20 tablet 1  . Red Yeast Rice Extract (RED YEAST RICE PO) Take 1 tablet by mouth daily.     . Riboflavin (VITAMIN B-2 PO) Take 1 tablet by mouth daily.     . rizatriptan (MAXALT) 10 MG tablet Take 1 tablet (10 mg total) by mouth 3 (three) times daily as needed for migraine. 10 tablet 5  . Semaglutide, 1 MG/DOSE, (OZEMPIC, 1 MG/DOSE,) 2 MG/1.5ML SOPN Inject 1 mg into the skin once a week. (Patient taking differently: Inject 1 mg into the skin every Sunday.) 4.5 mL 1  . SYNTHROID 125 MCG tablet Take two tablets (250mg  total) by mouth days 1-6 and skip day 7. (Patient taking differently: Take 125 mcg by mouth See admin instructions. Take with 100 mcg to equal 225 mcg, 6 days per week. Skip Sundays) 180 tablet 1  . Tretinoin, Facial Wrinkles, (RENOVA) 0.02 % CREA Apply 1 application topically at bedtime.    . triamcinolone cream (KENALOG) 0.1 % Apply 1 application topically 2 (two) times daily as needed. (Patient taking differently: Apply 1 application topically 2 (two) times daily as needed (rash).) 45 g 0  . venlafaxine XR (EFFEXOR XR) 75 MG 24 hr capsule Take 1 capsule (75 mg total) by mouth daily with breakfast. 30 capsule 5  . Vitamin D, Ergocalciferol, (DRISDOL) 1.25 MG (50000 UNIT) CAPS capsule Take 1 capsule (50,000 Units total) by mouth every 7 (seven) days. 12 capsule 0  . zonisamide (ZONEGRAN) 100 MG capsule Take 1 capsule daily 30 capsule 5  . metoprolol tartrate (LOPRESSOR) 25 MG tablet Take 1 tablet (25 mg total) by mouth in the morning. 90 tablet 3  . metoprolol tartrate (LOPRESSOR) 50 MG tablet Take 1 tablet (50 mg total) by mouth at bedtime. 90 tablet 3  . triamterene-hydrochlorothiazide (MAXZIDE-25) 37.5-25 MG tablet Take 1 tablet by mouth  daily. 90 tablet 3   No current facility-administered medications for this visit.   Allergies:  Crestor [rosuvastatin], Lipitor [atorvastatin], Percocet [oxycodone-acetaminophen], and Zocor [simvastatin]   Social History: The patient  reports that she has never smoked. She has never used smokeless tobacco. She reports current alcohol use. She reports that she does not use drugs.   Family History: The patient's family history includes Cancer in her father, mother, and sister; Colon cancer in her father; Diabetes (age of onset: 55) in her son; Heart attack in her mother; Hyperlipidemia in her mother; Hypothyroidism in her mother and sister; Memory loss in her daughter; Other in her daughter and son; Stroke in her mother.   ROS:  Please see the history of present illness. Otherwise, complete review of systems is positive for none.  All other systems are reviewed and negative.   Physical Exam: VS:  BP 128/86   Pulse 83   Ht 5\' 4"  (1.626 m)   Wt 225 lb 6.4 oz (102.2 kg)   SpO2 95%   BMI 38.69 kg/m , BMI Body mass index is 38.69 kg/m.  Wt Readings from Last 3 Encounters:  02/04/21 225 lb 6.4 oz (102.2 kg)  01/13/21 226 lb 6.4 oz (102.7 kg)  12/17/20 221 lb (100.2 kg)    General: Obese patient appears comfortable at rest. Neck: Supple, no elevated JVP or carotid bruits, no thyromegaly. Lungs: Clear to auscultation, nonlabored breathing at rest. Cardiac: Regular rate and rhythm, no S3 or significant systolic murmur, no pericardial rub. Extremities: No pitting edema, distal pulses 2+. Skin: Warm and dry. Musculoskeletal: No kyphosis. Neuropsychiatric: Alert and oriented x3, affect grossly appropriate.  ECG:    Recent Labwork: 01/13/2021: ALT 30; AST 22; BUN 16; Creatinine, Ser 0.73; Hemoglobin 14.7; Platelets 293.0; Potassium 4.2; Sodium 136; TSH 9.47     Component Value Date/Time   CHOL 277 (H) 01/13/2021 0958   CHOL 244 (H) 08/27/2020 1113   TRIG 196.0 (H) 01/13/2021 0958   HDL  59.40 01/13/2021 0958   HDL 48 08/27/2020 1113   CHOLHDL 5 01/13/2021 0958   VLDL 39.2 01/13/2021 0958   LDLCALC 178 (H) 01/13/2021 0958   LDLCALC 159 (H) 08/27/2020 1113    Other Studies Reviewed Today:  05/2019 echo IMPRESSIONS 1. The left ventricle has hyperdynamic systolic function, with an ejection fraction of >65%. The cavity size was normal. Left ventricular diastolic parameters were normal. No evidence of left ventricular regional wall motion abnormalities. 2. The right ventricle has normal systolic function. The cavity was normal. There is no increase in right ventricular wall thickness. 3. The aortic valve is tricuspid. Mild aortic annular calcification noted. 4. The mitral valve is grossly normal. 5. The tricuspid valve is grossly normal. 6. The aorta is normal unless otherwise noted.   03/2019 event monitor  14 day event monitor  Min HR 55, Max HR 161, Avg HR 80  Reported symptoms correlate with sinus rhythm  No significant arrhythmias  Assessment and Plan:  1. Palpitations   2. Essential hypertension   3. Mixed hyperlipidemia   4. Chest pain, unspecified type    1. Palpitations Still has palpitations but normally at night when lying in bed.  She states these are short-lived and are not causing any other symptoms other than she can feel them.  Continue metoprolol 25 mg a.m., metoprolol 50 mg p.m. recent TSH 9.47.  2. Essential hypertension Blood pressure reasonably well controlled on current medications today with 128/86 BP.  Continue lisinopril 5 mg  daily, triamterene/hydrochlorothiazide 37.5/25 mg daily.  3. Mixed hyperlipidemia LDL 199. Off statin due to increased LFTs and statin associated muscle symptoms. (History of fatty liver disease).  Recent liver enzymes back to normal at GI office.  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  States she will be self  injecting Repatha 140 mg subcu q. 2 weeks.  Recent lab work on 01/13/2021 total cholesterol 277, HDL 59, LDL 178, triglycerides 196.  4.  Chest pain Recent complaints of chest pain with exertion relieved with rest.  Please start sublingual nitroglycerin as needed chest pain.  Get a [employer].  Start aspirin 81 mg daily.  Medication Adjustments/Labs and Tests Ordered: Current medicines are reviewed at length with the patient today.  Concerns regarding medicines are outlined above.   Disposition: Follow-up with Dr. Wyline Mood or APP 6 to 8 weeks  Signed, Rennis Harding, NP 02/04/2021 12:10 PM    [facility]  [address] [place], [place], [place] [address] Phone: [phone]; Fax: [phone]

## 2021-02-04 ENCOUNTER — Ambulatory Visit (INDEPENDENT_AMBULATORY_CARE_PROVIDER_SITE_OTHER): Payer: Self-pay | Admitting: Family Medicine

## 2021-02-04 ENCOUNTER — Encounter: Payer: Self-pay | Admitting: *Deleted

## 2021-02-04 ENCOUNTER — Encounter: Payer: Self-pay | Admitting: Family Medicine

## 2021-02-04 VITALS — BP 128/86 | HR 83 | Ht 64.0 in | Wt 225.4 lb

## 2021-02-04 DIAGNOSIS — I10 Essential (primary) hypertension: Secondary | ICD-10-CM

## 2021-02-04 DIAGNOSIS — E782 Mixed hyperlipidemia: Secondary | ICD-10-CM

## 2021-02-04 DIAGNOSIS — R002 Palpitations: Secondary | ICD-10-CM

## 2021-02-04 DIAGNOSIS — R079 Chest pain, unspecified: Secondary | ICD-10-CM

## 2021-02-04 MED ORDER — ASPIRIN EC 81 MG PO TBEC: 81.0000 mg | DELAYED_RELEASE_TABLET | Freq: Every day | ORAL | Status: AC

## 2021-02-04 MED ORDER — NITROGLYCERIN 0.4 MG SL SUBL: 0.4000 mg | SUBLINGUAL_TABLET | SUBLINGUAL | 3 refills | Status: AC | PRN

## 2021-02-04 MED ORDER — METOPROLOL TARTRATE 50 MG PO TABS: 50.0000 mg | ORAL_TABLET | Freq: Every day | ORAL | 3 refills | Status: AC

## 2021-02-04 MED ORDER — METOPROLOL TARTRATE 25 MG PO TABS: 25.0000 mg | ORAL_TABLET | Freq: Every morning | ORAL | 3 refills | Status: AC

## 2021-02-04 MED ORDER — TRIAMTERENE-HCTZ 37.5-25 MG PO TABS: 1.0000 | ORAL_TABLET | Freq: Every day | ORAL | 3 refills | Status: AC

## 2021-02-04 NOTE — Addendum Note (Signed)
Addended by: Lesle Chris on: 02/04/2021 12:55 PM   Modules accepted: Orders

## 2021-02-04 NOTE — Patient Instructions (Addendum)
Medication Instructions:   Begin Nitroglycerin as needed for chest pain.   Begin Aspirin 81mg  daily -may buy over the counter.   Continue all other medications.    Labwork: none  Testing/Procedures:  Your physician has requested that you have a lexiscan myoview. For further information please visit . Please follow instruction sheet, as given.  Office will contact with results via phone or letter.    Follow-Up: 6-8 weeks   Any Other Special Instructions Will Be Listed Below (If Applicable).  If you need a refill on your cardiac medications before your next appointment, please call your pharmacy.

## 2021-02-11 ENCOUNTER — Ambulatory Visit
Admission: RE | Admit: 2021-02-11 | Discharge: 2021-02-11 | Disposition: A | Discharge status: Home / Self Care | Payer: No Typology Code available for payment source | Source: Ambulatory Visit | Attending: Neurology | Admitting: Neurology

## 2021-02-11 ENCOUNTER — Other Ambulatory Visit: Payer: Self-pay

## 2021-02-11 IMAGING — MR MR CERVICAL SPINE WO/W CM
4 of 8 series · 24 of 48 positions shown · IV contrast (20 ML MULTIHANCE)
Comparison: MRI [DATE] (without report).

CLINICAL DATA: Multiple sclerosis.

EXAM:
MRI CERVICAL SPINE WITHOUT AND WITH CONTRAST
TECHNIQUE: Multiplanar and multiecho pulse sequences of the cervical spine, to
include the craniocervical junction and cervicothoracic junction,
were obtained without and with intravenous contrast.
CONTRAST:  20mL MULTIHANCE GADOBENATE DIMEGLUMINE 529 MG/ML IV SOLN

[Series 4: T1 · sagittal · 3.0mm · 0.41mm/px · 4 of 13 slices shown (1 of 2)]
[im 1/13]
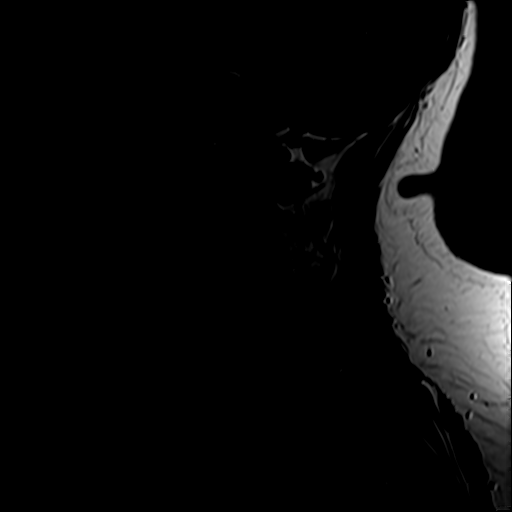
[im 5/13]
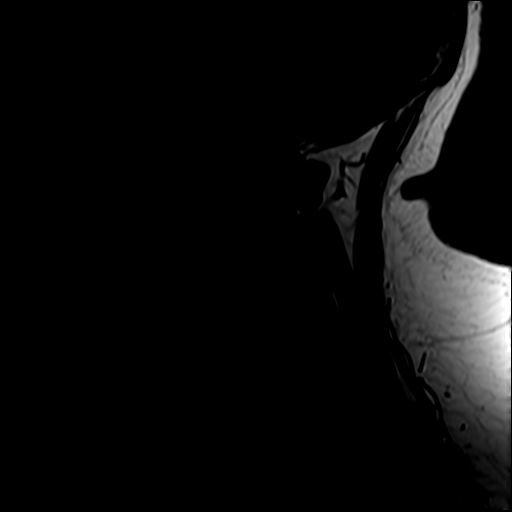
[im 9/13]
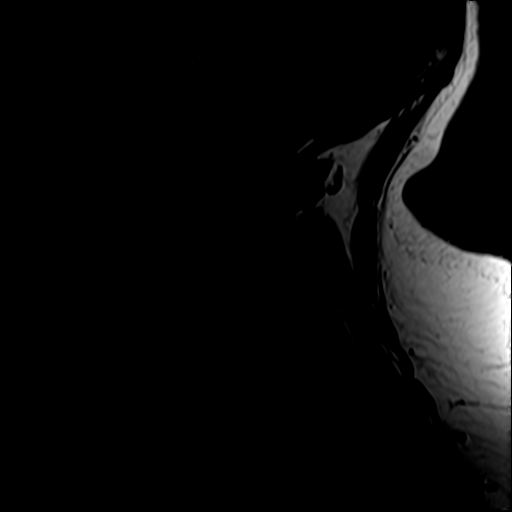
[im 13/13]
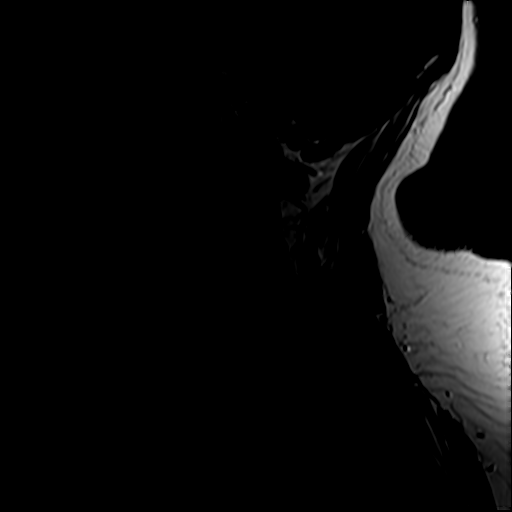

[Series 6: T2 · axial · 3.0mm · 0.70mm/px · z∈[-204,-113]mm · 8 of 26 slices shown (1 of 2)]
[im 1/26]
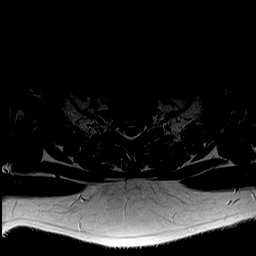
[im 4/26]
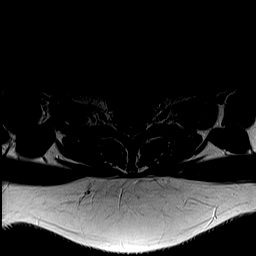
[im 8/26]
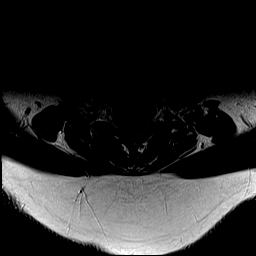
[im 11/26]
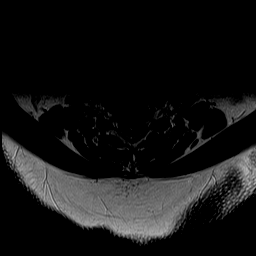
[im 15/26]
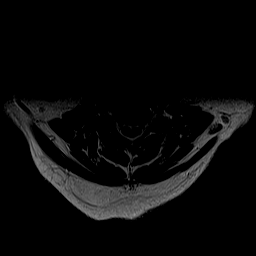
[im 18/26]
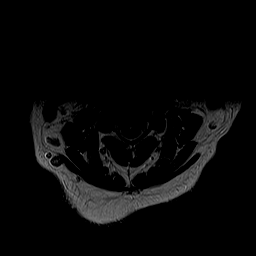
[im 22/26]
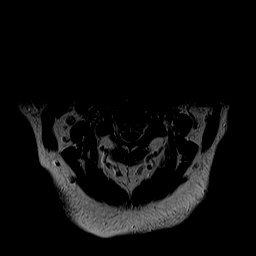
[im 26/26]
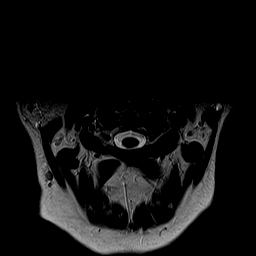

[Series 7: T1 · axial · 3.0mm · 0.35mm/px · z∈[-204,-113]mm · 8 of 26 slices shown (2 of 2)]
[im 1/26]
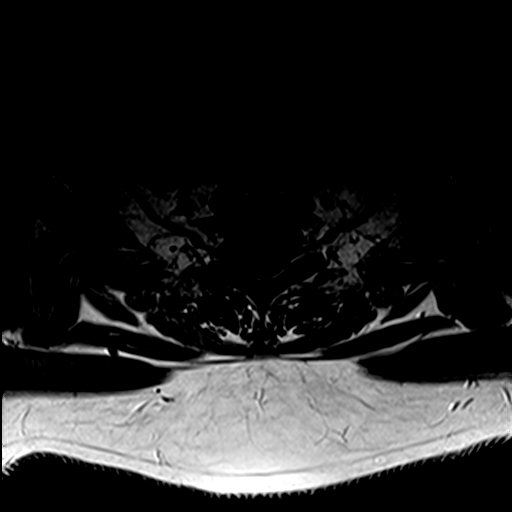
[im 4/26]
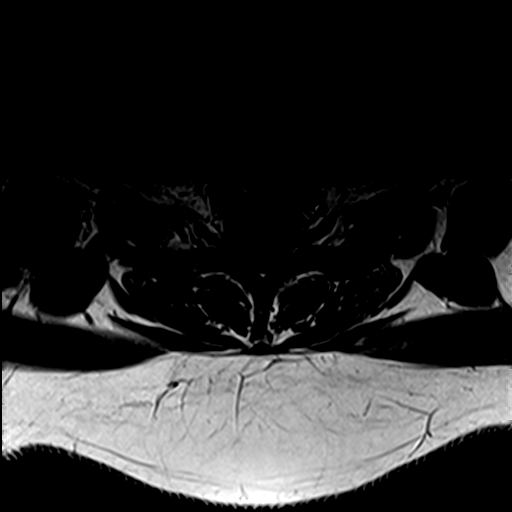
[im 8/26]
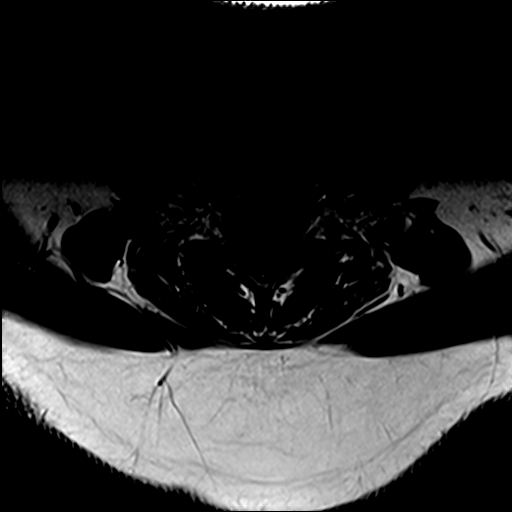
[im 11/26]
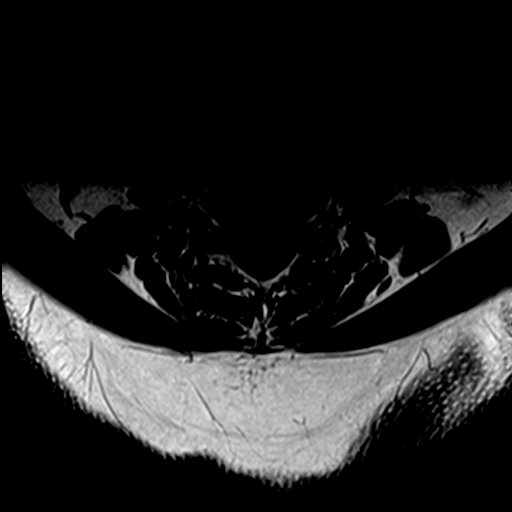
[im 15/26]
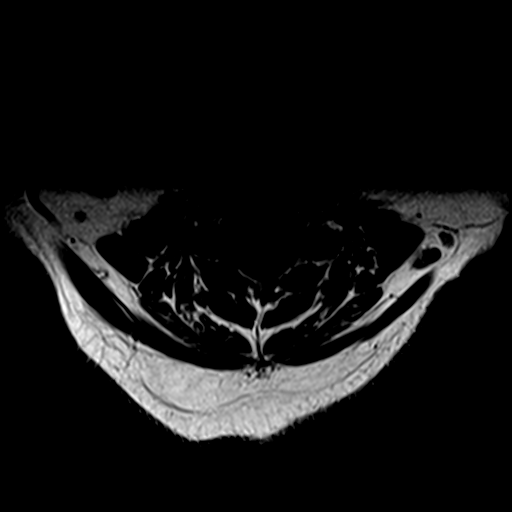
[im 18/26]
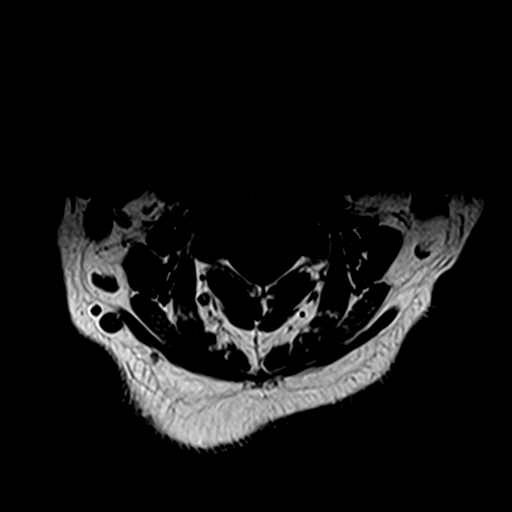
[im 22/26]
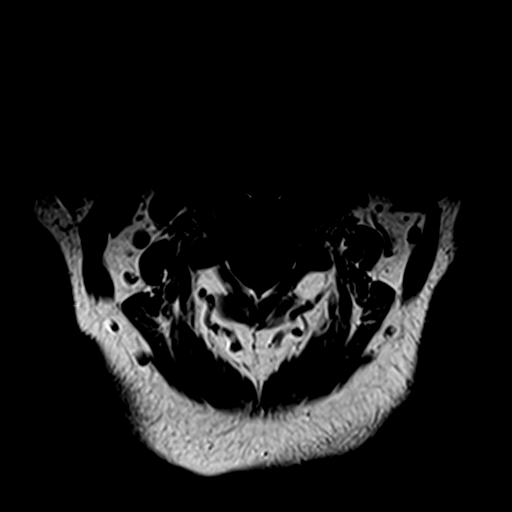
[im 26/26]
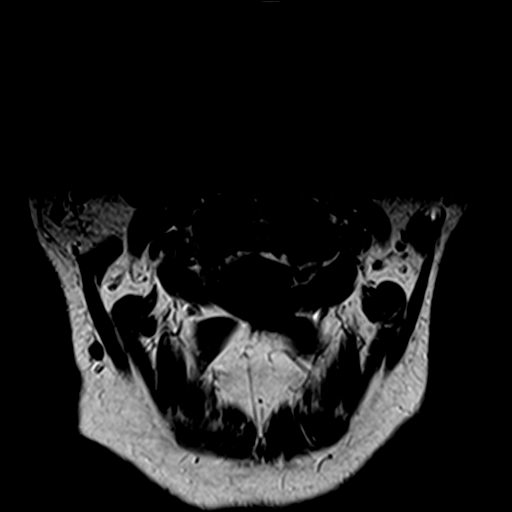

[Series 8: T2 · sagittal · 3.0mm · 0.41mm/px · 4 of 13 slices shown (2 of 2)]
[im 1/13]
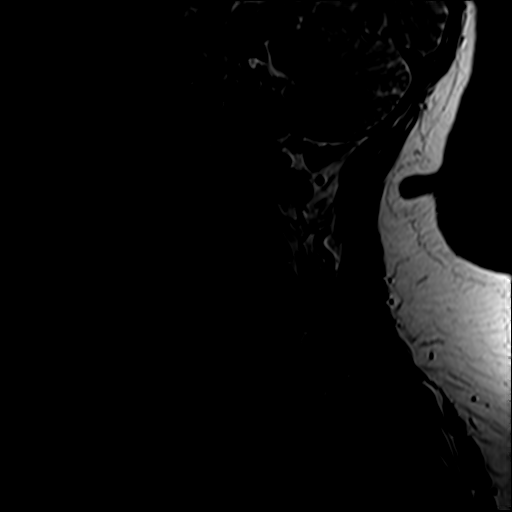
[im 5/13]
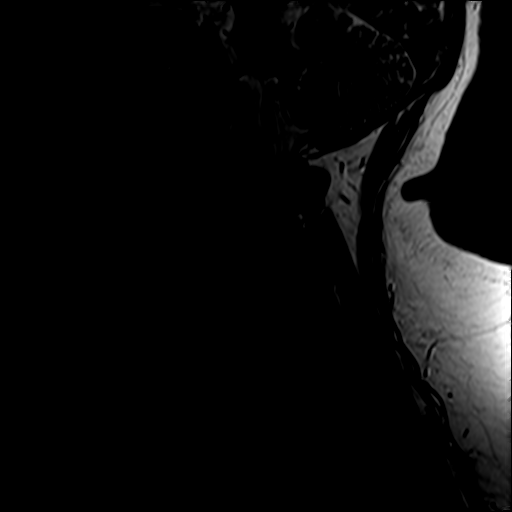
[im 9/13]
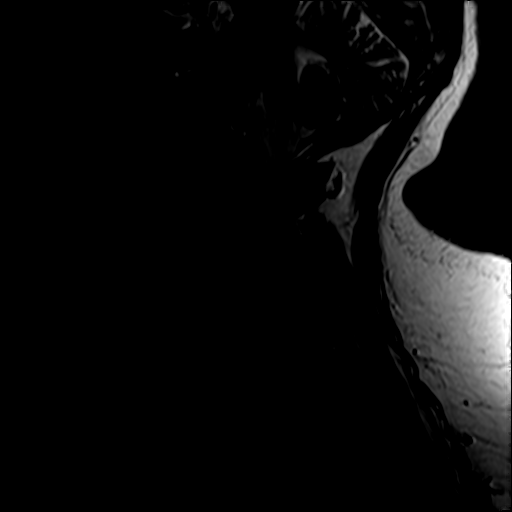
[im 13/13]
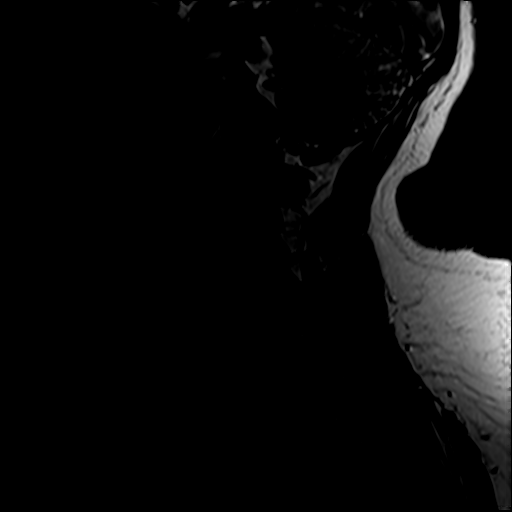

[24 of 48 positions shown; findings below may reference images not displayed]

FINDINGS: Alignment: Straightening of the normal cervical lordosis. No
substantial sagittal subluxation.

Vertebrae: Vertebral body heights are maintained. No specific
evidence of acute fracture, discitis/osteomyelitis, or suspicious
bone lesion.

Cord: Mildly motion limited evaluation of the cord without
convincing cord signal abnormality. No abnormal cord enhancement.

Posterior Fossa, vertebral arteries, paraspinal tissues: Visualized
vertebral artery flow voids are maintained. Unremarkable paraspinal
soft tissues. Please see concurrent MRI head for evaluation of the
posterior fossa.

Disc levels:

C2-C3: No significant disc protrusion, foraminal stenosis, or canal
stenosis.

C3-C4: Small right eccentric posterior disc osteophyte complex
without significant canal or foraminal stenosis.

C4-C5: Small posterior disc osteophyte complex and mild right
uncovertebral hypertrophy without significant canal or foraminal
stenosis.

C5-C6: Posterior disc osteophyte complex, which contacts and
flattens the ventral cord with mild-to-moderate canal stenosis. Mild
uncovertebral hypertrophy without significant foraminal stenosis.
Findings are not substantially changed from the prior.

C6-C7: Lobulated, right eccentric posterior disc osteophyte complex
which contacts and deforms the right ventral cord with
mild-to-moderate canal stenosis. Bilateral facet/uncovertebral
hypertrophy. Mild to moderate bilateral foraminal stenosis. Findings
are similar to prior.

C7-T1: Posterior disc osteophyte complex with mild canal stenosis.
Mild facet/uncovertebral hypertrophy without significant canal or
foraminal stenosis.
IMPRESSION: 1. No convincing cord signal abnormality or enhancement.
2. Similar mild to moderate canal stenosis at C5-C6 and C6-C7 with
posterior discs contacting and deforming the ventral cord. Similar
mild canal stenosis at C7-T1.
3. Similar mild to moderate bilateral foraminal stenosis at C6-C7.

## 2021-02-11 IMAGING — MR MR HEAD WO/W CM
13 series · 48 of 48 positions shown · IV contrast (multihance)
Comparison: [DATE].

CLINICAL DATA: Multiple sclerosis.

EXAM:
MRI HEAD WITHOUT AND WITH CONTRAST
TECHNIQUE: Multiplanar, multiecho pulse sequences of the brain and surrounding
structures were obtained without and with intravenous contrast.
CONTRAST:  20mL MULTIHANCE GADOBENATE DIMEGLUMINE 529 MG/ML IV SOLN

[Series 2: T1 · sagittal · 5.0mm · 0.45mm/px · 1 of 21 slices shown]
[im 1/21]
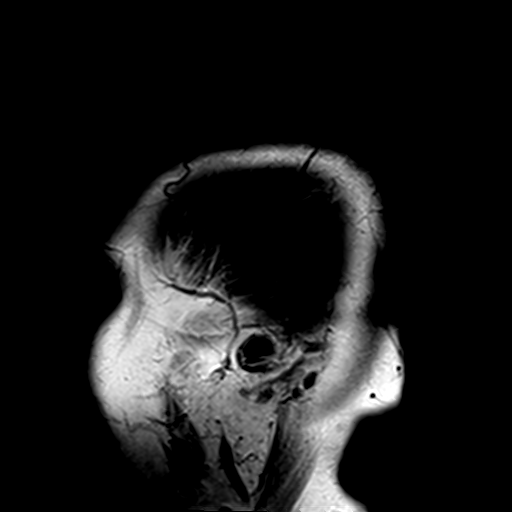

[Series 3: FLAIR · sagittal · 5.0mm · 0.45mm/px · 2 of 25 slices shown (1 of 2)]
[im 1/25]
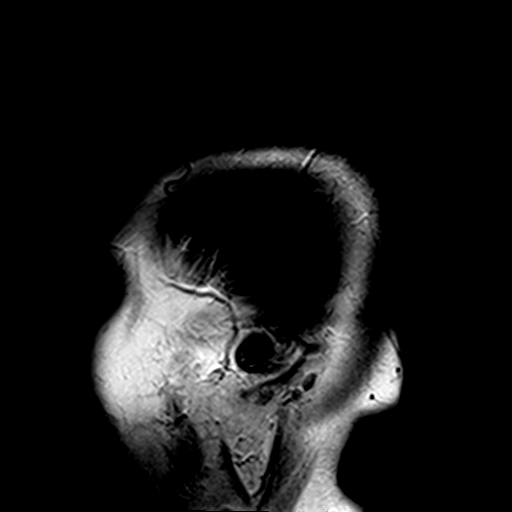
[im 25/25]
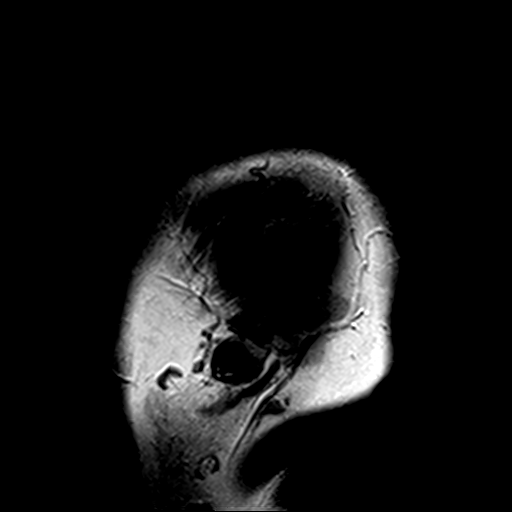

[Series 4: DWI · axial · 3.0mm · 1.80mm/px · z∈[-55,+92]mm · 7 of 100 slices shown (1 of 4)]
[im 1/100]
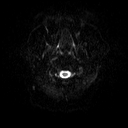
[im 17/100]
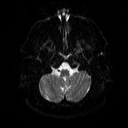
[im 34/100]
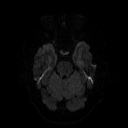
[im 50/100]
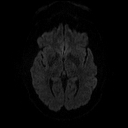
[im 67/100]
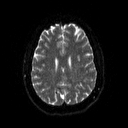
[im 83/100]
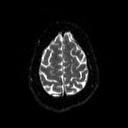
[im 100/100]
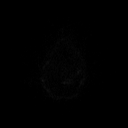

[Series 5: DWI · axial · 3.0mm · 1.80mm/px · z∈[-55,+92]mm · 3 of 50 slices shown (2 of 4)]
[im 1/50]
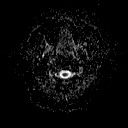
[im 25/50]
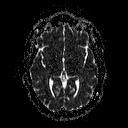
[im 50/50]
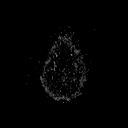

[Series 6: DWI · coronal · 5.0mm · 1.80mm/px · 4 of 68 slices shown (3 of 4)]
[im 1/68]
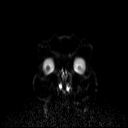
[im 23/68]
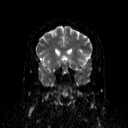
[im 45/68]
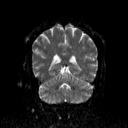
[im 68/68]
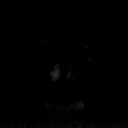

[Series 7: DWI · coronal · 5.0mm · 1.80mm/px · 2 of 34 slices shown (4 of 4)]
[im 1/34]
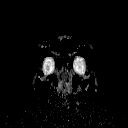
[im 34/34]
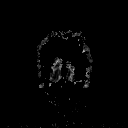

[Series 8: T2 · axial · 5.0mm · 0.60mm/px · 1 of 22 slices shown (1 of 2)]
[im 1/22]
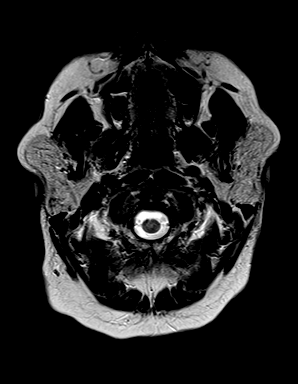

[Series 9: FLAIR · axial · 3.0mm · 0.45mm/px · z∈[-53,+90]mm · 2 of 32 slices shown (2 of 2)]
[im 1/32]
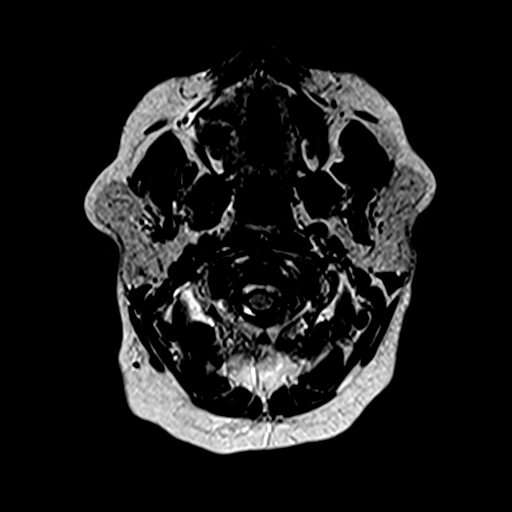
[im 32/32]
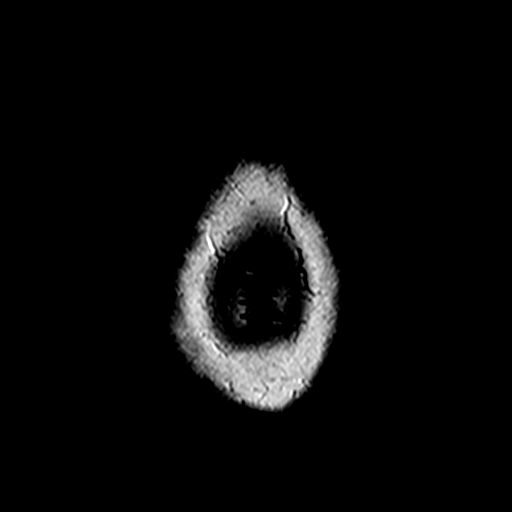

[Series 11: swi_images · axial · 4.0mm · 0.90mm/px · z∈[-51,+88]mm · 2 of 36 slices shown]
[im 1/36]
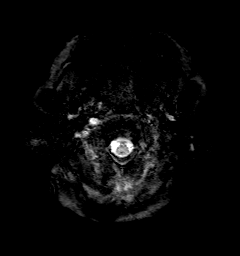
[im 36/36]
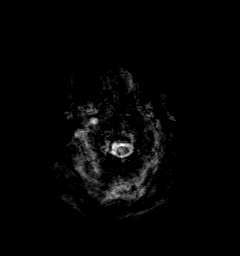

[Series 12: t1_mpr_tra · axial · 1.0mm · 0.75mm/px · z∈[-53,+90]mm · 10 of 144 slices shown]
[im 1/144]
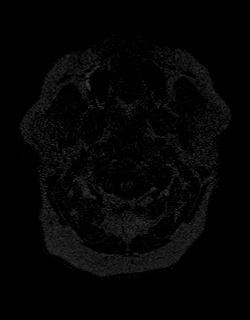
[im 16/144]
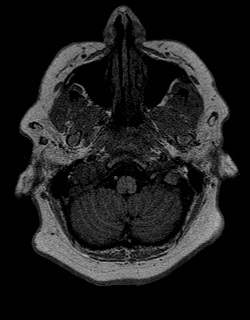
[im 32/144]
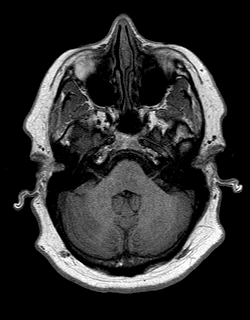
[im 48/144]
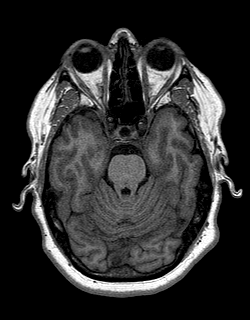
[im 64/144]
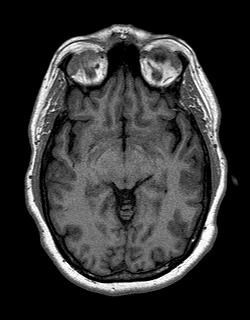
[im 80/144]
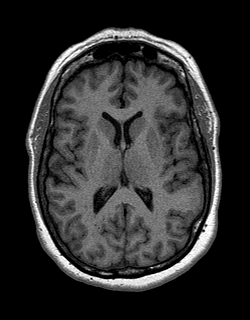
[im 96/144]
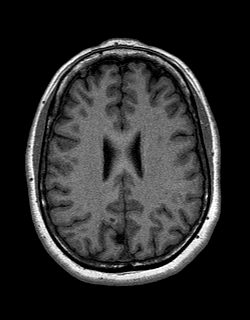
[im 112/144]
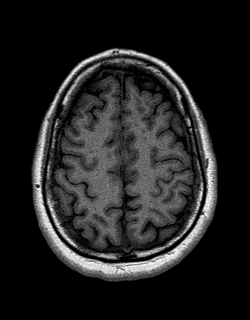
[im 128/144]
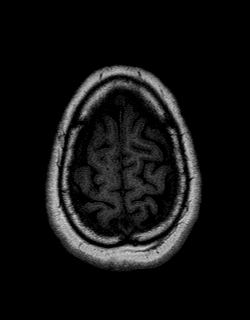
[im 144/144]
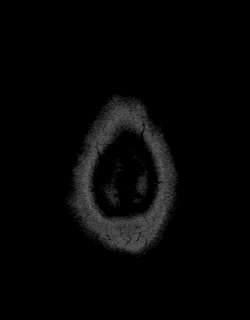

[Series 13: T2 · coronal · 5.0mm · 0.45mm/px · 2 of 25 slices shown (2 of 2)]
[im 1/25]
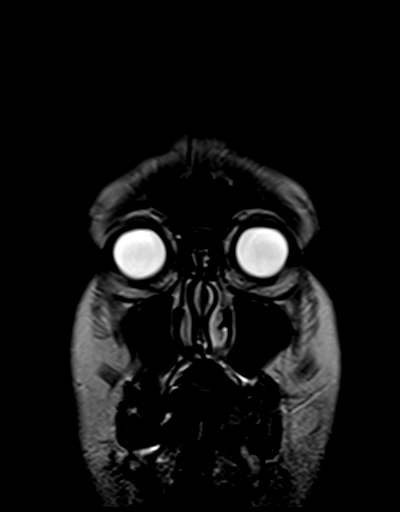
[im 25/25]
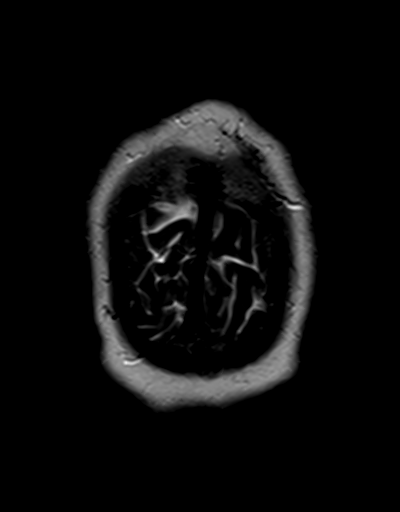

[Series 14: t1_mpr_tra post · axial · 1.0mm · 0.75mm/px · z∈[-53,+90]mm · 10 of 144 slices shown]
[im 1/144]
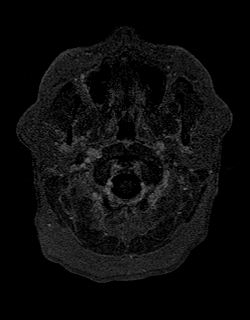
[im 16/144]
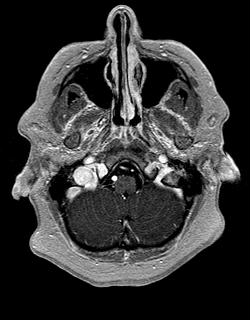
[im 32/144]
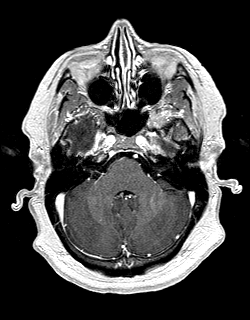
[im 48/144]
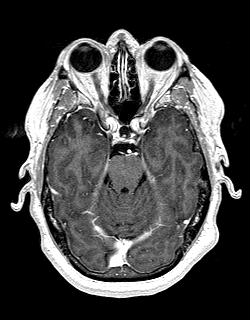
[im 64/144]
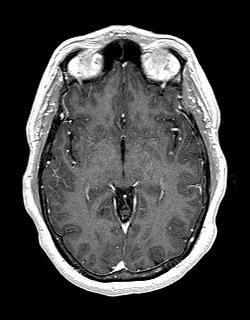
[im 80/144]
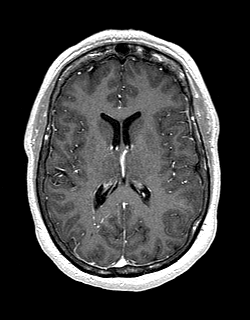
[im 96/144]
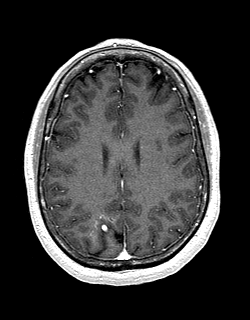
[im 112/144]
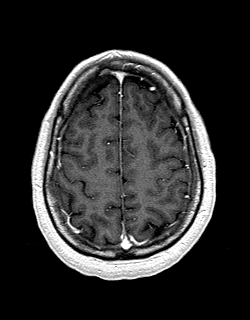
[im 128/144]
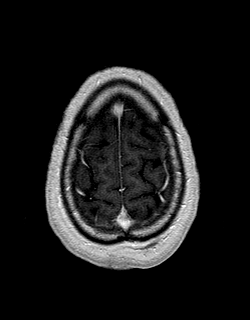
[im 144/144]
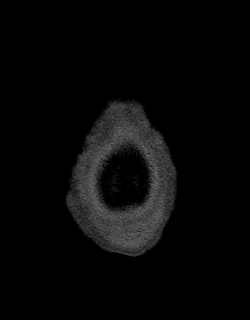

[Series 15: post cor · coronal · 5.0mm · 0.45mm/px · 2 of 25 slices shown]
[im 1/25]
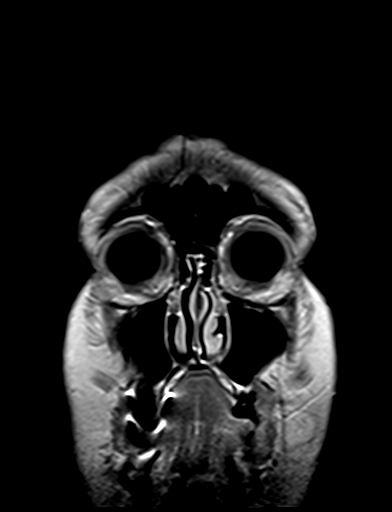
[im 25/25]
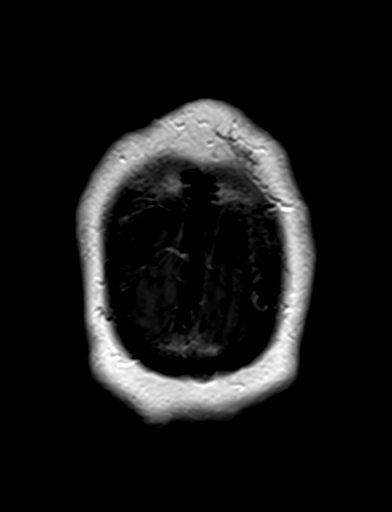

[48 of 48 positions shown; findings below may reference images not displayed]

FINDINGS: Brain: Similar scattered subcortical/juxtacortical and
periventricular T2/FLAIR hyperintensities within the white matter.
No new lesions or enhancing lesions. Similar large developmental
venous anomaly in the posterior right parietal lobe, likely draining
to the superior sagittal sinus. Adjacent focus of susceptibility
artifact and T2 hypointensity (series 8, image 15 and series 11,
image 24) is also similar and likely represents a cavernous venous
malformation.

No acute infarct. No hydrocephalus. No mass lesion, abnormal mass
effect, or extra-axial fluid collection.

Vascular: Major arterial flow voids are maintained at the skull
base.

Skull and upper cervical spine: Normal marrow signal.

Sinuses/Orbits: Small retention cyst versus fluid in a posterior
left ethmoid air cell. Otherwise, clear sinuses.

Other: No mastoid effusions.
IMPRESSION: 1. Similar periventricular and subcortical/juxtacortical T2/FLAIR
hyperintensities in this patient with reported multiple sclerosis.
No new or enhancing lesions identified.
2. Similar posterior right parietal developmental venous anomaly and
suspected cavernous venous malformation.

## 2021-02-11 MED ORDER — GADOBENATE DIMEGLUMINE 529 MG/ML IV SOLN
20.0000 mL | Freq: Once | INTRAVENOUS | Status: AC | PRN
  Administered 2021-02-11: 20 mL via INTRAVENOUS

## 2021-02-13 ENCOUNTER — Ambulatory Visit (INDEPENDENT_AMBULATORY_CARE_PROVIDER_SITE_OTHER): Payer: No Typology Code available for payment source

## 2021-02-13 ENCOUNTER — Telehealth: Payer: Self-pay

## 2021-02-13 ENCOUNTER — Other Ambulatory Visit: Payer: Self-pay

## 2021-02-13 DIAGNOSIS — E538 Deficiency of other specified B group vitamins: Secondary | ICD-10-CM

## 2021-02-13 DIAGNOSIS — M4802 Spinal stenosis, cervical region: Secondary | ICD-10-CM

## 2021-02-13 MED ORDER — CYANOCOBALAMIN 1000 MCG/ML IJ SOLN
1000.0000 ug | Freq: Once | INTRAMUSCULAR | Status: AC
  Administered 2021-02-13: 1000 ug via INTRAMUSCULAR

## 2021-02-13 NOTE — Telephone Encounter (Signed)
-----   Message from Drema Dallas, DO sent at 02/12/2021 12:46 PM EDT ----- MRI of brain and cervical spine reviewed.  Findings are unchanged compared to studies from 2020.  There are some spots on the brain which are rather nonspecific and no evidence of MS in the spinal cord.  There are a couple of discs in the neck that press into the spinal cord (although not severe).  This is unchanged compared to 2020.  Unclear if this could be contributing to weakness as she does not exhibit any clear signs on exam to suggest spinal cord damage.  I can refer her to neurosurgery for their opinion.  However, since findings are stable compared to 2020, I would hold off and first proceed with lumbar puncture

## 2021-02-13 NOTE — Telephone Encounter (Signed)
[facility] referral added

## 2021-02-13 NOTE — Progress Notes (Signed)
Per orders of Dr. Banks, injection of Cyanocobalamin 1000 mcg given by Bennye Nix L Teodora Baumgarten. °Patient tolerated injection well.  °

## 2021-02-19 ENCOUNTER — Encounter (HOSPITAL_COMMUNITY)
Admission: RE | Admit: 2021-02-19 | Discharge: 2021-02-19 | Disposition: A | Discharge status: Home / Self Care | Payer: Self-pay | Source: Ambulatory Visit | Attending: Family Medicine | Admitting: Family Medicine

## 2021-02-19 ENCOUNTER — Encounter (HOSPITAL_COMMUNITY): Admission: RE | Admit: 2021-02-19 | Payer: Self-pay | Source: Ambulatory Visit

## 2021-02-19 DIAGNOSIS — R079 Chest pain, unspecified: Secondary | ICD-10-CM | POA: Insufficient documentation

## 2021-02-21 ENCOUNTER — Ambulatory Visit: Payer: Self-pay | Admitting: Dietician

## 2021-02-22 ENCOUNTER — Other Ambulatory Visit: Payer: Self-pay | Admitting: Endocrinology

## 2021-02-22 DIAGNOSIS — Z794 Long term (current) use of insulin: Secondary | ICD-10-CM

## 2021-03-06 ENCOUNTER — Ambulatory Visit (HOSPITAL_COMMUNITY)
Admission: RE | Admit: 2021-03-06 | Discharge: 2021-03-06 | Disposition: A | Discharge status: Home / Self Care | Payer: Self-pay | Source: Ambulatory Visit | Attending: Family Medicine | Admitting: Family Medicine

## 2021-03-06 ENCOUNTER — Other Ambulatory Visit: Payer: Self-pay

## 2021-03-06 DIAGNOSIS — R079 Chest pain, unspecified: Secondary | ICD-10-CM | POA: Insufficient documentation

## 2021-03-06 LAB — NM MYOCAR MULTI W/SPECT W/WALL MOTION / EF
LV dias vol: 73 mL (ref 46–106)
LV sys vol: 23 mL
Peak HR: 115 {beats}/min
RATE: 0.42
Rest HR: 75 {beats}/min
SDS: 2
SRS: 0
SSS: 2
TID: 1.14

## 2021-03-06 MED ORDER — TECHNETIUM TC 99M TETROFOSMIN IV KIT
10.0000 | PACK | Freq: Once | INTRAVENOUS | Status: AC | PRN
  Administered 2021-03-06: 11 via INTRAVENOUS

## 2021-03-06 MED ORDER — TECHNETIUM TC 99M TETROFOSMIN IV KIT
30.0000 | PACK | Freq: Once | INTRAVENOUS | Status: AC | PRN
  Administered 2021-03-06: 30 via INTRAVENOUS

## 2021-03-06 MED ORDER — SODIUM CHLORIDE FLUSH 0.9 % IV SOLN
INTRAVENOUS | Status: AC
  Administered 2021-03-06: 10 mL via INTRAVENOUS
  Filled 2021-03-06: qty 10

## 2021-03-06 MED ORDER — REGADENOSON 0.4 MG/5ML IV SOLN
INTRAVENOUS | Status: AC
  Administered 2021-03-06: 0.4 mg via INTRAVENOUS
  Filled 2021-03-06: qty 5

## 2021-03-13 ENCOUNTER — Other Ambulatory Visit: Payer: Self-pay

## 2021-03-13 ENCOUNTER — Ambulatory Visit (INDEPENDENT_AMBULATORY_CARE_PROVIDER_SITE_OTHER): Payer: No Typology Code available for payment source | Admitting: Orthopedic Surgery

## 2021-03-13 ENCOUNTER — Encounter: Payer: Self-pay | Admitting: Orthopedic Surgery

## 2021-03-13 VITALS — BP 136/80 | HR 79 | Ht 64.0 in | Wt 223.0 lb

## 2021-03-13 DIAGNOSIS — M19071 Primary osteoarthritis, right ankle and foot: Secondary | ICD-10-CM

## 2021-03-13 MED ORDER — DICLOFENAC POTASSIUM 50 MG PO TABS: 50.0000 mg | ORAL_TABLET | Freq: Two times a day (BID) | ORAL | 3 refills | Status: DC

## 2021-03-13 NOTE — Progress Notes (Signed)
Foot orthotics   Diclofenac  Consult   Chief Complaint  Patient presents with   Ankle Pain    Severe pain right ankle was feeling good until a couple weeks ago pain returned, now very painful     50 yearsold female history of multiple sclerosis status post left foot and ankle reconstruction years ago at [facility] presented to [place] in July complaining of her ankle feeling like it did before with a feeling that something was moving inside the ankle, she complained of pain in the lateral subtalar region with difficulty walking and stiffness   She went to therapy was unhappy we sent her to a different therapy site to work on posterior tibial tendon dysfunction and pain in the foot and ankle area   Physical findings were tenderness in the subtalar region decreased subtalar and ankle motion with a plantigrade foot with tightness of the Achilles   X-ray showed a calcaneal spur on the plantar side and mild midfoot arthritis but no structural problems at that timePain is in the subtalar joint   Thresia comes in today complaining of increased pain in the subtalar joint  No recent trauma or injury  Does complain of swelling at the end of the day which resolves overnight  She is having some difficulty with her gait  Her exam shows tenderness in the subtalar joint pes planus which is flexible pain with subtalar motion especially valgus stress  We discussed the possibility of wearing a cam walker but she cannot afford it  So we settled on over-the-counter orthotics  She will start diclofenac 50 mg twice a day and get a CAT scan of her foot and then will ask for foot and ankle consult on definitive management  Meds ordered this encounter  Medications   diclofenac (CATAFLAM) 50 MG tablet    Sig: Take 1 tablet (50 mg total) by mouth 2 (two) times daily.    Dispense:  90 tablet    Refill:  3

## 2021-03-13 NOTE — Patient Instructions (Addendum)
Stop ibuprofen and naproxen   Start diclofenac 50 mg bid   Try the foot orthotics   See foot and ankle specialist who will call you   While we are working on your approval for CT please go ahead and call to schedule your appointment with [facility] within at least one (1) week.   Central Scheduling [phone]

## 2021-03-14 NOTE — Progress Notes (Signed)
Referring Provider: Billie Ruddy, MD Primary Care Physician:  Billie Ruddy, MD Primary GI Physician: Dr. Gala Romney  Chief Complaint  Patient presents with   Abdominal Pain    L lower abdominal side pain, softer stools lately    HPI:   Amy Lester is a 50 y.o. female with history of elevated LFTs, GERD, nausea without vomiting, greater than 1.5-year history of lower abdominal pain.  Notably, LFTs normalized in November 2021.  Prior evaluation with ultrasound in May 2021 with hepatomegaly and hepatic steatosis.  Viral hepatitis testing was negative with no immunity to hepatitis A or B.  Iron panel normal.  Suspected fatty liver and possible med effect in the setting of Tylenol and diclofenac.  EGD and colonoscopy on file from February 2022 with both procedures entirely normal.  Due for repeat colonoscopy in 2027.  CT A/P on file from November 2021 for lower abdominal pain with no acute findings. She is presenting today for follow-up.   Last seen in our office 12/13/2020.  Reported little GERD breakthrough daily on omeprazole 40 mg daily.  Continues with intermittent nausea with vomiting's a couple times a week.  Using Zofran prn.  Denied early satiety and states she eats quite a bit.  Was seeing a dietitian due to prediabetes.  Ongoing chronic intermittent LLQ or RLQ abdominal pain 6/7 days a week lasting hours at a time with no identified aggravating or relieving factors.  No association with meals or bowel movements.  Also with increased gas.  BMs every 2 to 3 days.  Tried Colace and MiraLAX which created increase stool frequency but no change in her abdominal pain.  Regarding LFTs, noted alk phos was slightly elevated at 140 in December 2021, otherwise LFTs remain within normal limits.  She was taking naproxen and ibuprofen to help with abdominal pain.  Plan to increase omeprazole to 40 mg twice daily, discontinue NSAIDs and iron, discussed with endocrinology if she could come off of  metformin.  Etiology of lower abdominal pain unclear.  Queried adhesions versus IBS though atypical symptoms, possible med effect.  We will try Bentyl, screen for celiac disease, avoid common gas producing items, try lactose-free diet, and try Beano before meals.  Update HFP and obtain GGT.  Labs completed 12/13/2020: Celiac screen negative.  HFP within normal limits.  GGT normal. Labs completed with PCP 01/13/2021: TSH elevated at 9.47, hemoglobin A1c 7.6, alk phos 138 (H), LFTs normal, CBC normal, vitamin B12 low normal, vitamin D low.  Today:   GERD: Fairly well controlled on omeprazole 40 mg. Always taking once a day. Occasionally taking twice a day or 3 times a day. Avoiding acidic foods and caffeine. No dysphagia.   Nausea: Continue with nausea off and on. Vomiting x1 this month. Using zofran as needed.   Abdominal Pain: Continues with left lower abdominal pain.  Feels this is worsening. At least 5/7 days a week.  Last 30 minutes to 1+ hr. Severity ranges from mild to severe causing her to be in tears and doubled over.  No rhyme or reason to when the pain occurs.  No particular time of day.  Not associated with any activity, meals, or bowel movements.  She has not found anything consistent with this.  No relieving factors.  She will take ibuprofen or Tylenol.  Tried Bentyl, but this did not help.  Stools are softer, but still formed. Not diarrhea. Has a BM every other day to every 2 days. This is  her baseline. Has a hemorrhoid with a little toilet tissue hematochezia at this time. No black stool.   Weight is stable.  Denies fever or chills.  Continues to feel fatigued. Started Vitamin B12 injection. Hasn't started vitamin D.   Admits to history of endometriosis.  Has not seen OB/GYN since moving here in 2019.  Past Medical History:  Diagnosis Date   Asthma    Blood transfusion without reported diagnosis    Chronic migraine    Common migraine with intractable migraine 02/16/2019   Diabetes  mellitus without complication (HCC)    Endometriosis    Fatty liver    GERD (gastroesophageal reflux disease)    Hyperlipidemia    Hypertension    Hypothyroidism    Incontinence    Irregular heartbeat    Multiple sclerosis ([place])    Palpitations    Postconcussive syndrome 05/09/2020   Pre-diabetes    Sleep apnea     Past Surgical History:  Procedure Laterality Date   ABDOMINAL HYSTERECTOMY     CESAREAN SECTION     x1   CHOLECYSTECTOMY     COLONOSCOPY WITH PROPOFOL N/A 11/04/2020   Surgeon: Daneil Dolin, MD; normal exam.  Repeat in 10 years.   ENDOMETRIAL ABLATION     ESOPHAGOGASTRODUODENOSCOPY (EGD) WITH PROPOFOL N/A 11/04/2020   Surgeon: Daneil Dolin, MD; normal exam.   FOOT SURGERY Left 06/2018   foot reconstruction   REPLACEMENT TOTAL KNEE Left     Current Outpatient Medications  Medication Sig Dispense Refill   acetaminophen (TYLENOL) 500 MG tablet Take 500 mg by mouth as needed for moderate pain.     albuterol (VENTOLIN HFA) 108 (90 Base) MCG/ACT inhaler Inhale 3 puffs into the lungs every 6 (six) hours as needed for wheezing or shortness of breath. (Patient taking differently: Inhale 1-3 puffs into the lungs every 6 (six) hours as needed for wheezing or shortness of breath.) 3 g 1   aspirin EC 81 MG tablet Take 1 tablet (81 mg total) by mouth daily. Swallow whole.     busPIRone (BUSPAR) 7.5 MG tablet Take 7.5 mg by mouth 2 (two) times daily as needed.     calcium carbonate (TUMS - DOSED IN MG ELEMENTAL CALCIUM) 500 MG chewable tablet Chew 2-3 tablets by mouth daily as needed for indigestion or heartburn.     cetirizine (ZYRTEC) 10 MG tablet Take 1 tablet (10 mg total) by mouth daily. 90 tablet 0   ciprofloxacin (CILOXAN) 0.3 % ophthalmic solution Place into both eyes daily.     clotrimazole-betamethasone (LOTRISONE) cream Apply 1 application topically as needed. (Patient taking differently: Apply 1 application topically as needed (Outbreak).) 45 g 2   Cyanocobalamin  (B-12 COMPLIANCE INJECTION IJ) Inject 1 Dose as directed every 30 (thirty) days.     diclofenac (CATAFLAM) 50 MG tablet Take 1 tablet (50 mg total) by mouth 2 (two) times daily. 90 tablet 3   Erenumab-aooe (AIMOVIG, 140 MG DOSE,) 70 MG/ML SOAJ Inject 140 mg into the skin every 30 (thirty) days. (Patient taking differently: Inject 140 mg into the skin every 28 (twenty-eight) days.) 1.12 mL 5   escitalopram (LEXAPRO) 20 MG tablet Take 1 tablet by mouth daily.     estradiol (EVAMIST) 1.53 MG/SPRAY transdermal spray Place 3 sprays onto the skin daily.     ezetimibe (ZETIA) 10 MG tablet Take 1 tablet (10 mg total) by mouth daily. 90 tablet 3   glipiZIDE (GLUCOTROL) 10 MG tablet Take 10 mg by mouth daily.  hydrocortisone (ANUSOL-HC) 2.5 % rectal cream Place 1 application rectally 2 (two) times daily. 30 g 1   ibuprofen (ADVIL) 200 MG tablet Take 600-800 mg by mouth as needed for mild pain or moderate pain.     insulin degludec (TRESIBA FLEXTOUCH) 100 UNIT/ML FlexTouch Pen Inject 20 units under the skin twice daily. (Patient taking differently: Inject 20 Units into the skin 2 (two) times daily.) 15 mL 0   levothyroxine (SYNTHROID) 100 MCG tablet Take 100 mcg by mouth See admin instructions. Take with the 194mg to equal 225 mcg, 6 days per week. Skip Sundays     lisinopril (ZESTRIL) 5 MG tablet Take 1 tablet (5 mg total) by mouth daily. 90 tablet 1   MAGNESIUM OXIDE PO Take 1 tablet by mouth daily.     MELATONIN PO Take 1 capsule by mouth at bedtime as needed (sleep).     metFORMIN (GLUCOPHAGE) 1000 MG tablet Take 1,000 mg by mouth 2 (two) times daily.     metoprolol tartrate (LOPRESSOR) 25 MG tablet Take 1 tablet (25 mg total) by mouth in the morning. 90 tablet 3   metoprolol tartrate (LOPRESSOR) 50 MG tablet Take 1 tablet (50 mg total) by mouth at bedtime. 90 tablet 3   metroNIDAZOLE (METROGEL) 1 % gel Apply 1 application topically at bedtime.      montelukast (SINGULAIR) 10 MG tablet Take 10 mg by  mouth every morning.     Multiple Vitamins-Minerals (MULTIVITAMIN WITH MINERALS) tablet Take 1 tablet by mouth daily.     naproxen (NAPROSYN) 500 MG tablet Take 1 tablet (500 mg total) by mouth 2 (two) times daily with a meal. (Patient taking differently: Take 500 mg by mouth as needed.) 30 tablet 0   nitroGLYCERIN (NITROSTAT) 0.4 MG SL tablet Place 1 tablet (0.4 mg total) under the tongue every 5 (five) minutes as needed for chest pain. 25 tablet 3   nystatin cream (MYCOSTATIN) Apply 1 application topically daily as needed for dry skin.     Olopatadine HCl (PAZEO OP) Place 1 drop into both eyes daily.     ondansetron (ZOFRAN) 4 MG tablet Take 1 tablet (4 mg total) by mouth every 8 (eight) hours as needed for nausea or vomiting. (Patient taking differently: Take 4 mg by mouth as needed for nausea or vomiting.) 20 tablet 1   Red Yeast Rice Extract (RED YEAST RICE PO) Take 1 tablet by mouth daily.      Riboflavin (VITAMIN B-2 PO) Take 1 tablet by mouth daily.      rizatriptan (MAXALT) 10 MG tablet Take 1 tablet (10 mg total) by mouth 3 (three) times daily as needed for migraine. (Patient taking differently: Take 10 mg by mouth as needed for migraine.) 10 tablet 5   Semaglutide, 1 MG/DOSE, (OZEMPIC, 1 MG/DOSE,) 2 MG/1.5ML SOPN Inject 1 mg into the skin once a week. (Patient taking differently: Inject 1 mg into the skin every Sunday.) 4.5 mL 1   SYNTHROID 125 MCG tablet Take two tablets (2557mtotal) by mouth days 1-6 and skip day 7. (Patient taking differently: Take 125 mcg by mouth See admin instructions. Take with 100 mcg to equal 225 mcg, 6 days per week. Skip Sundays) 180 tablet 1   Tretinoin, Facial Wrinkles, (RENOVA) 0.02 % CREA Apply 1 application topically at bedtime.     triamcinolone cream (KENALOG) 0.1 % Apply 1 application topically 2 (two) times daily as needed. (Patient taking differently: Apply 1 application topically 2 (two) times daily as needed (  rash).) 45 g 0    triamterene-hydrochlorothiazide (MAXZIDE-25) 37.5-25 MG tablet Take 1 tablet by mouth daily. 90 tablet 3   venlafaxine XR (EFFEXOR XR) 75 MG 24 hr capsule Take 1 capsule (75 mg total) by mouth daily with breakfast. 30 capsule 5   Vitamin D, Ergocalciferol, (DRISDOL) 1.25 MG (50000 UNIT) CAPS capsule Take 1 capsule (50,000 Units total) by mouth every 7 (seven) days. 12 capsule 0   zonisamide (ZONEGRAN) 100 MG capsule Take 1 capsule daily 30 capsule 5   omeprazole (PRILOSEC) 40 MG capsule Take 1 capsule (40 mg total) by mouth 2 (two) times daily before a meal. 60 capsule 3   ondansetron (ZOFRAN) 8 MG tablet Take 1 tablet (8 mg total) by mouth every 8 (eight) hours as needed for nausea or vomiting. 20 tablet 1   No current facility-administered medications for this visit.    Allergies as of 03/17/2021 - Review Complete 03/17/2021  Allergen Reaction Noted   Crestor [rosuvastatin]  08/27/2020   Lipitor [atorvastatin]  08/27/2020   Percocet [oxycodone-acetaminophen] Nausea And Vomiting 11/24/2018   Zocor [simvastatin]  08/27/2020    Family History  Problem Relation Age of Onset   Heart attack Mother    Stroke Mother    Cancer Mother        breast cancer   Hyperlipidemia Mother    Hypothyroidism Mother    Cancer Father        lymph nodes, liver cancer   Colon cancer Father        early 6s   Other Daughter        Fine Gold Type II Syndrome and cognetive issues   Memory loss Daughter    Other Son        Fine Gold Syndrome and BPES   Diabetes Son 52       IDDM   Hypothyroidism Sister    Cancer Sister    Hyperthyroidism Neg Hx     Social History   Socioeconomic History   Marital status: Married    Spouse name: Not on file   Number of children: Not on file   Years of education: 14   Highest education level: Not on file  Occupational History   Not on file  Tobacco Use   Smoking status: Never   Smokeless tobacco: Never  Vaping Use   Vaping Use: Never used  Substance and  Sexual Activity   Alcohol use: Yes    Comment: once a year   Drug use: Never   Sexual activity: Not on file  Other Topics Concern   Not on file  Social History Narrative   Left handed    Caffeine 1 daily    Lives at home with husband and adult daughter   One story home   Social Determinants of Health   Financial Resource Strain: Not on file  Food Insecurity: Not on file  Transportation Needs: Not on file  Physical Activity: Not on file  Stress: Not on file  Social Connections: Not on file    Review of Systems: Gen: Denies fever, chills, cold or flulike symptoms, presyncope, syncope. GI: See HPI Heme: See HPI  Physical Exam: BP 130/83   Pulse 77   Temp (!) 97.5 F (36.4 C) (Temporal)   Ht _0  (1.626 m)   Wt 223 lb 3.2 oz (101.2 kg)   BMI 38.31 kg/m  General:   Alert and oriented. No distress noted. Pleasant and cooperative.  Head:  Normocephalic and atraumatic. Eyes:  Conjuctiva clear without scleral icterus. Heart:  S1, S2 present without murmurs appreciated. Lungs:  Clear to auscultation bilaterally. No wheezes, rales, or rhonchi. No distress.  Abdomen:  +BS. Abdomen is soft and nondistended.  Moderate tenderness to palpation in the left lower quadrant.  With moderate to deep palpation, patient had more severe abdominal pain, jumping on exam table. No rebound or guarding. No HSM or masses noted.  Msk:  Symmetrical without gross deformities. Normal posture. Extremities:  Without edema. Neurologic:  Alert and  oriented x4 Psych:  Normal mood and affect.    Assessment:  50 year old female with GI history of fatty liver, chronic GERD, nausea without vomiting, chronic lower abdominal pain presenting today for follow-up.  Also with history significant for of diabetes, hypothyroidism, MS, and endometriosis.  GERD and nausea with intermittent vomiting: GERD is fairly well controlled on omeprazole 40 mg daily to twice daily, but sometimes taking 3 times a day.   Intermittent nausea using Zofran as needed.  Vomited x1 this month.  EGD on file February 2022 entirely normal.  With history of diabetes, query whether patient may have gastroparesis contributing to her symptoms.  We will plan for gastric emptying study to evaluate further.  Recommended she go ahead and take omeprazole 40 mg twice daily every day and let us know of any ongoing symptoms.  Reinforced GERD diet.  LLQ abdominal Pain: Chronic lower abdominal pain for greater than 1.5 years.  Overall, patient feels this is worsening somewhat, occurring 5+ days a week and ranges in severity from mild to making her cry, lasting 30 minutes to 1+ hr. Etiology has been unclear thus far.  CT A/P November 2021 with no acute findings.  Colonoscopy February 2022 entirely normal.  No identified aggravating or relieving factors. Failed trial of Bentyl.  On exam today, she does have increased left lower quadrant tenderness compared to prior exams.  No significant change in bowel habits.  Scan toilet tissue hematochezia due to hemorrhoid flare.  Denies melena.  Differentials include adhesive disease secondary to prior abdominal surgeries (hysterectomy and C-section), endometriosis, ovarian etiology, and less likely but cannot rule out diverticulitis.  Due to worsening symptoms and increased pain on exam today, will repeat CT A/P with contrast.  If no acute findings,  we will refer to OB/GYN for further evaluation.  Can also consider trial of Levsin.  Hemorrhoid: Patient reports a hemorrhoid flare this week with a little toilet tissue hematochezia.  Colonoscopy on file from February 2022, due for repeat in 10 years.  Will send in Anusol rectal cream.   Fatigue: Possibly secondary to low normal B12 and vitamin D deficiency. She has started vitamin B12 injections, but hasn't started vitamin D as recommended by PCP.  Notably, she is also taking iron which she reports starting due to feeling fatigued, but she is not iron deficient  or anemic.  Recommended she stop iron, continue B12, and start vitamin D per PCP.  Plan: CT A/P with contrast ASAP.  If CT is unrevealing, refer to OB/GYN for further evaluation of LLQ abdominal pain and consider trial of Levsin.  GES to evaluate N/V.  Take omeprazole 40 mg twice daily every day.  Refill sent to pharmacy.  Reinforced GERD diet/lifestyle.  Written instructions provided.  Continue to use Zofran as needed.  Refill sent to pharmacy.  Start Anusol rectal cream twice daily x7-10 days for hemorrhoids.  Then as needed.  Stop dicyclomine.  Stop iron  Continue vitamin B12 injections per PCP.  Start vitamin D as recommended per PCP.  Follow-up in November 2022.  Advised to call with questions or concerns prior.    Aliene Altes, PA-C [facility] 03/17/2021

## 2021-03-17 ENCOUNTER — Encounter: Payer: Self-pay | Admitting: Endocrinology

## 2021-03-17 ENCOUNTER — Ambulatory Visit: Payer: No Typology Code available for payment source

## 2021-03-17 ENCOUNTER — Ambulatory Visit: Payer: Medicaid Other | Admitting: Neurology

## 2021-03-17 ENCOUNTER — Other Ambulatory Visit: Payer: Self-pay

## 2021-03-17 ENCOUNTER — Telehealth: Payer: Self-pay

## 2021-03-17 ENCOUNTER — Ambulatory Visit (INDEPENDENT_AMBULATORY_CARE_PROVIDER_SITE_OTHER): Payer: Self-pay | Admitting: Gastroenterology

## 2021-03-17 ENCOUNTER — Encounter: Payer: Self-pay | Admitting: Gastroenterology

## 2021-03-17 ENCOUNTER — Ambulatory Visit (INDEPENDENT_AMBULATORY_CARE_PROVIDER_SITE_OTHER): Payer: Medicaid Other | Admitting: Endocrinology

## 2021-03-17 VITALS — BP 130/78 | HR 90 | Ht 64.0 in | Wt 224.8 lb

## 2021-03-17 VITALS — BP 130/83 | HR 77 | Temp 97.5°F | Ht 64.0 in | Wt 223.2 lb

## 2021-03-17 DIAGNOSIS — Z794 Long term (current) use of insulin: Secondary | ICD-10-CM

## 2021-03-17 DIAGNOSIS — K219 Gastro-esophageal reflux disease without esophagitis: Secondary | ICD-10-CM

## 2021-03-17 DIAGNOSIS — R110 Nausea: Secondary | ICD-10-CM

## 2021-03-17 DIAGNOSIS — R1032 Left lower quadrant pain: Secondary | ICD-10-CM | POA: Insufficient documentation

## 2021-03-17 DIAGNOSIS — R5383 Other fatigue: Secondary | ICD-10-CM

## 2021-03-17 DIAGNOSIS — K649 Unspecified hemorrhoids: Secondary | ICD-10-CM | POA: Insufficient documentation

## 2021-03-17 DIAGNOSIS — E063 Autoimmune thyroiditis: Secondary | ICD-10-CM

## 2021-03-17 DIAGNOSIS — E1165 Type 2 diabetes mellitus with hyperglycemia: Secondary | ICD-10-CM

## 2021-03-17 MED ORDER — FREESTYLE LIBRE 2 READER DEVI: 1.0000 | Freq: Once | 0 refills | Status: AC

## 2021-03-17 MED ORDER — OMEPRAZOLE 40 MG PO CPDR: 40.0000 mg | DELAYED_RELEASE_CAPSULE | Freq: Two times a day (BID) | ORAL | 3 refills | Status: DC

## 2021-03-17 MED ORDER — INSULIN LISPRO (1 UNIT DIAL) 100 UNIT/ML (KWIKPEN): 15.0000 [IU] | PEN_INJECTOR | Freq: Three times a day (TID) | SUBCUTANEOUS | 5 refills | Status: DC

## 2021-03-17 MED ORDER — ONDANSETRON HCL 8 MG PO TABS: 8.0000 mg | ORAL_TABLET | Freq: Three times a day (TID) | ORAL | 1 refills | Status: DC | PRN

## 2021-03-17 MED ORDER — HYDROCORTISONE (PERIANAL) 2.5 % EX CREA: 1.0000 "application " | TOPICAL_CREAM | Freq: Two times a day (BID) | CUTANEOUS | 1 refills | Status: DC

## 2021-03-17 MED ORDER — FREESTYLE LIBRE 2 SENSOR MISC: 2.0000 | 3 refills | Status: DC
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
## 2021-03-17 NOTE — Patient Instructions (Addendum)
We will arrange for you to have a CT scan of your abdomen and pelvis as soon as possible to further evaluate your left lower abdominal pain.  If your CT scan does not show any acute abnormalities, we will refer you to OB/GYN as we discussed due to your history of endometriosis.  We are also arranging for you to have a gastric emptying study to further evaluate nausea with occasional vomiting.  Continue taking omeprazole for acid reflux.  I recommend you take this twice daily every day before breakfast and dinner.  Follow a GERD diet:  Avoid fried, fatty, greasy, spicy, citrus foods. Avoid caffeine and carbonated beverages. Avoid chocolate. Try eating 4-6 small meals a day rather than 3 large meals. Do not eat within 3 hours of laying down. Prop head of bed up on wood or bricks to create a 6 inch incline.  I have sent in Anusol rectal cream for your hemorrhoid.  Apply this twice daily to your rectum for the next 7-10 days.  Then use as needed.  You may stop dicyclomine as you do not feel this is helping your abdominal pain.  You may also stop iron as you do not have iron deficiency.  Continue with your B12 injections.  Recommend you start vitamin D as recommended by primary care.  We will follow-up with you in November.  Do not hesitate to call if you have any questions or concerns prior to your next visit!   [name], PA-C [facility]

## 2021-03-17 NOTE — Patient Instructions (Addendum)
8 Units HUMALOG before supper for average meals.  Start with dinner 1st  Check blood sugars on waking up 3 days a week  Also check blood sugars about 2 hours after meals and do this after different meals by rotation  Recommended blood sugar levels on waking up are 90-130 and about 2 hours after meal is 130-160  Please bring your blood sugar monitor to each visit, thank you

## 2021-03-17 NOTE — Telephone Encounter (Signed)
GES scheduled for 03/21/21 at 9:00am, arrive at 8:45am. NPO after midnight and no stomach med.  Called pt to inform her of GES appt. She needs to reschedule appt. Gave her phone# to nuc med to change appt to more convenient time.

## 2021-03-17 NOTE — Addendum Note (Signed)
Addended by: Reather Littler on: 03/17/2021 04:07 PM   Modules accepted: Orders

## 2021-03-17 NOTE — Progress Notes (Signed)
Cardiology Office Note  Date: 03/18/2021   ID: Amy Lester, DOB 16-May-1971, MRN [id number]  PCP:  Deeann Saint, MD  Cardiologist:  Dina Rich, MD Electrophysiologist:  None   Chief Complaint: Follow-up palpitations, HTN, HLD  History of Present Illness: Amy Lester is a 50 y.o. female with a history of palpitations, HTN, HLD, DM2, NAFLD, GERD, hypothyroidism, MS, OSA.  Last encounter with Dr. Wyline Mood 04/24/2020.  History of palpitations and seen in the ER in the past with episode of palpitations occurring while laying in bed.  Recent increase in symptoms with heavy heartbeats.  Symptoms lasting about 10 minutes.  Feeling weak all over, occurring few times a week.  Had a previous cardiac monitor 3 years prior.  Event monitor July 2020 no arrhythmias.  Had previously changed to Toprol which gave her fatigue.  She went back on Metoprolol 25 mg a.m., 50 mg p.m.  Still having symptoms at times.  Lasting a few minutes.  She was compliant with her statin medication.  Lipids on 02/2019 : TC 246, HDL 45, TG 28, LDL 163.  She had elevated LFTs and was seeing GI.  Later she was off statin.  Had some lower extremity edema, primarily left side.  Echo 05/28/2019 showed EF greater than 65%, indeterminate diastolic function.  He was compliant with antihypertensive medication.    She was here at a prior visit at suggestion of an outpatient clinic due to elevated LDL on recent labs.  She stated her LDL was 199.  Previously on statin medications but had elevated LFTs.  Statin was stopped and she was on Zetia only.  History of fatty liver disease and seeing gastroenterology.  Previously intolerant to statins due to statin associated muscle symptoms.  She continued to have some palpitations which were more noticeable at night when  in bed which were short-lived and she had no other symptoms.  She had what she described as weird symptoms in her arms.  She could feel like her heart was beating in her arms  occasionally.  She denied any anginal symptoms or exertional symptoms.  She is limited limited in ability to exercise due to reconstruction of her ankle/foot.  Her diabetes was much better controlled recently with hemoglobin A1c of 6.1% 2 weeks ago.  LFTs on 07/24/2020: AST 21, ALT 24.  At last visit she stated she was pending receipt of Repatha and was to start it as soon as she received it.  She was started on 140 mg subcu every 2 weeks.  She was of recent chest discomfort substernal and under left breast with exertion.  She denied radiation.  She was having some mild dizziness associated.  She denied any diaphoresis but stated she tended to sweat a lot due to to perimenopausal state.  She was concerned she may have heart disease given significant elevations in her lipid levels and associated risk factors including hypertension, HLD, DM 2.  Recent labs on 01/13/2021 demonstrated elevated glucose of 229, alkaline phosphatase 138, total cholesterol 277, LDL 178, HDL 59.4, triglycerides 196.  TSH of 9.47.,  Hemoglobin A1c 7.6.  She is here to follow-up on her recent Lexiscan stress test.  We discussed the results of the stress test at length.  Stress test was considered low risk per the interpreting physician.  She states she still continues with chest pain which comes and goes and can occur with or without activity.  States she has a strong family history of heart disease and  is continuing to be concerned about her ongoing chest pain.  At last visit he had been given sublingual nitroglycerin and had taken it and had some mild relief.  States she has been taking Repatha for between 6 and 8 weeks and has not had follow-up lipids and she is requesting to have a lipid panel done to see if this has improved.  She currently denies any DOE, palpitations, orthostatic symptoms, CVA or TIA-like symptoms, PND, orthopnea, bleeding.  No claudication-like symptoms, DVT or PE-like symptoms, or lower extremity edema.   Past  Medical History:  Diagnosis Date   Asthma    Blood transfusion without reported diagnosis    Chronic migraine    Common migraine with intractable migraine 02/16/2019   Diabetes mellitus without complication (HCC)    Endometriosis    Fatty liver    GERD (gastroesophageal reflux disease)    Hyperlipidemia    Hypertension    Hypothyroidism    Incontinence    Irregular heartbeat    Multiple sclerosis (HCC)    Palpitations    Postconcussive syndrome 05/09/2020   Pre-diabetes    Sleep apnea     Past Surgical History:  Procedure Laterality Date   ABDOMINAL HYSTERECTOMY     CESAREAN SECTION     x1   CHOLECYSTECTOMY     COLONOSCOPY WITH PROPOFOL N/A 11/04/2020   Surgeon: Corbin Adeourk, Robert M, MD; normal exam.  Repeat in 10 years.   ENDOMETRIAL ABLATION     ESOPHAGOGASTRODUODENOSCOPY (EGD) WITH PROPOFOL N/A 11/04/2020   Surgeon: Corbin Adeourk, Robert M, MD; normal exam.   FOOT SURGERY Left 06/2018   foot reconstruction   REPLACEMENT TOTAL KNEE Left     Current Outpatient Medications  Medication Sig Dispense Refill   acetaminophen (TYLENOL) 500 MG tablet Take 500 mg by mouth as needed for moderate pain.     albuterol (VENTOLIN HFA) 108 (90 Base) MCG/ACT inhaler Inhale 3 puffs into the lungs every 6 (six) hours as needed for wheezing or shortness of breath. (Patient taking differently: Inhale 1-3 puffs into the lungs every 6 (six) hours as needed for wheezing or shortness of breath.) 3 g 1   aspirin EC 81 MG tablet Take 1 tablet (81 mg total) by mouth daily. Swallow whole.     busPIRone (BUSPAR) 7.5 MG tablet Take 7.5 mg by mouth 2 (two) times daily as needed.     calcium carbonate (TUMS - DOSED IN MG ELEMENTAL CALCIUM) 500 MG chewable tablet Chew 2-3 tablets by mouth daily as needed for indigestion or heartburn.     cetirizine (ZYRTEC) 10 MG tablet Take 1 tablet (10 mg total) by mouth daily. 90 tablet 0   ciprofloxacin (CILOXAN) 0.3 % ophthalmic solution Place into both eyes daily.      clotrimazole-betamethasone (LOTRISONE) cream Apply 1 application topically as needed. (Patient taking differently: Apply 1 application topically as needed (Outbreak).) 45 g 2   Continuous Blood Gluc Sensor (FREESTYLE LIBRE 2 SENSOR) MISC 2 Devices by Does not apply route every 14 (fourteen) days. 2 each 3   Cyanocobalamin (B-12 COMPLIANCE INJECTION IJ) Inject 1 Dose as directed every 30 (thirty) days.     diclofenac (CATAFLAM) 50 MG tablet Take 1 tablet (50 mg total) by mouth 2 (two) times daily. 90 tablet 3   Erenumab-aooe (AIMOVIG, 140 MG DOSE,) 70 MG/ML SOAJ Inject 140 mg into the skin every 30 (thirty) days. (Patient taking differently: Inject 140 mg into the skin every 28 (twenty-eight) days.) 1.12 mL 5   escitalopram (  LEXAPRO) 20 MG tablet Take 1 tablet by mouth daily.     estradiol (EVAMIST) 1.53 MG/SPRAY transdermal spray Place 3 sprays onto the skin daily.     Evolocumab (REPATHA SURECLICK) 140 MG/ML SOAJ Inject 140 mg into the skin every 14 (fourteen) days.     ezetimibe (ZETIA) 10 MG tablet Take 1 tablet (10 mg total) by mouth daily. 90 tablet 3   glipiZIDE (GLUCOTROL) 10 MG tablet Take 10 mg by mouth daily.      hydrocortisone (ANUSOL-HC) 2.5 % rectal cream Place 1 application rectally 2 (two) times daily. 30 g 1   ibuprofen (ADVIL) 200 MG tablet Take 600-800 mg by mouth as needed for mild pain or moderate pain.     insulin degludec (TRESIBA FLEXTOUCH) 100 UNIT/ML FlexTouch Pen Inject 20 units under the skin twice daily. (Patient taking differently: Inject 20 Units into the skin 2 (two) times daily.) 15 mL 0   insulin lispro (HUMALOG KWIKPEN) 100 UNIT/ML KwikPen Inject 15 Units into the skin 3 (three) times daily. 15 mL 5   isosorbide mononitrate (IMDUR) 30 MG 24 hr tablet Take 0.5 tablets (15 mg total) by mouth daily. 15 tablet 6   levothyroxine (SYNTHROID) 100 MCG tablet Take 100 mcg by mouth See admin instructions. Take with the to equal 225 mcg, 6 days per week. Skip Sundays      lisinopril (ZESTRIL) 5 MG tablet Take 1 tablet (5 mg total) by mouth daily. 90 tablet 1   MAGNESIUM OXIDE PO Take 1 tablet by mouth daily.     MELATONIN PO Take 1 capsule by mouth at bedtime as needed (sleep).     metFORMIN (GLUCOPHAGE) 1000 MG tablet Take 1,000 mg by mouth 2 (two) times daily.     metoprolol tartrate (LOPRESSOR) 25 MG tablet Take 1 tablet (25 mg total) by mouth in the morning. 90 tablet 3   metoprolol tartrate (LOPRESSOR) 50 MG tablet Take 1 tablet (50 mg total) by mouth at bedtime. 90 tablet 3   metroNIDAZOLE (METROGEL) 1 % gel Apply 1 application topically at bedtime.      montelukast (SINGULAIR) 10 MG tablet Take 10 mg by mouth every morning.     Multiple Vitamins-Minerals (MULTIVITAMIN WITH MINERALS) tablet Take 1 tablet by mouth daily.     naproxen (NAPROSYN) 500 MG tablet Take 1 tablet (500 mg total) by mouth 2 (two) times daily with a meal. (Patient taking differently: Take 500 mg by mouth as needed.) 30 tablet 0   nitroGLYCERIN (NITROSTAT) 0.4 MG SL tablet Place 1 tablet (0.4 mg total) under the tongue every 5 (five) minutes as needed for chest pain. 25 tablet 3   nystatin cream (MYCOSTATIN) Apply 1 application topically daily as needed for dry skin.     Olopatadine HCl (PAZEO OP) Place 1 drop into both eyes daily.     omeprazole (PRILOSEC) 40 MG capsule Take 1 capsule (40 mg total) by mouth 2 (two) times daily before a meal. 60 capsule 3   ondansetron (ZOFRAN) 4 MG tablet Take 1 tablet (4 mg total) by mouth every 8 (eight) hours as needed for nausea or vomiting. (Patient taking differently: Take 4 mg by mouth as needed for nausea or vomiting.) 20 tablet 1   ondansetron (ZOFRAN) 8 MG tablet Take 1 tablet (8 mg total) by mouth every 8 (eight) hours as needed for nausea or vomiting. 20 tablet 1   Red Yeast Rice Extract (RED YEAST RICE PO) Take 1 tablet by mouth daily.  Riboflavin (VITAMIN B-2 PO) Take 1 tablet by mouth daily.      rizatriptan (MAXALT) 10 MG tablet  Take 1 tablet (10 mg total) by mouth 3 (three) times daily as needed for migraine. (Patient taking differently: Take 10 mg by mouth as needed for migraine.) 10 tablet 5   Semaglutide, 1 MG/DOSE, (OZEMPIC, 1 MG/DOSE,) 2 MG/1.5ML SOPN Inject 1 mg into the skin once a week. (Patient taking differently: Inject 1 mg into the skin every Sunday.) 4.5 mL 1   SYNTHROID 125 MCG tablet Take two tablets (250mg  total) by mouth days 1-6 and skip day 7. (Patient taking differently: Take 125 mcg by mouth See admin instructions. Take with 100 mcg to equal 225 mcg, 6 days per week. Skip Sundays) 180 tablet 1   Tretinoin, Facial Wrinkles, (RENOVA) 0.02 % CREA Apply 1 application topically at bedtime.     triamcinolone cream (KENALOG) 0.1 % Apply 1 application topically 2 (two) times daily as needed. (Patient taking differently: Apply 1 application topically 2 (two) times daily as needed (rash).) 45 g 0   triamterene-hydrochlorothiazide (MAXZIDE-25) 37.5-25 MG tablet Take 1 tablet by mouth daily. 90 tablet 3   venlafaxine XR (EFFEXOR XR) 75 MG 24 hr capsule Take 1 capsule (75 mg total) by mouth daily with breakfast. 30 capsule 5   Vitamin D, Ergocalciferol, (DRISDOL) 1.25 MG (50000 UNIT) CAPS capsule Take 1 capsule (50,000 Units total) by mouth every 7 (seven) days. 12 capsule 0   zonisamide (ZONEGRAN) 100 MG capsule Take 1 capsule daily 30 capsule 5   No current facility-administered medications for this visit.   Allergies:  Crestor [rosuvastatin], Lipitor [atorvastatin], Percocet [oxycodone-acetaminophen], and Zocor [simvastatin]   Social History: The patient  reports that she has never smoked. She has never used smokeless tobacco. She reports current alcohol use. She reports that she does not use drugs.   Family History: The patient's family history includes Cancer in her father, mother, and sister; Colon cancer in her father; Diabetes (age of onset: 2) in her son; Heart attack in her mother; Hyperlipidemia in her  mother; Hypothyroidism in her mother and sister; Memory loss in her daughter; Other in her daughter and son; Stroke in her mother.   ROS:  Please see the history of present illness. Otherwise, complete review of systems is positive for none.  All other systems are reviewed and negative.   Physical Exam: VS:  BP 124/88   Pulse 76   Ht 5\' 4"  (1.626 m)   Wt 225 lb (102.1 kg)   SpO2 94%   BMI 38.62 kg/m , BMI Body mass index is 38.62 kg/m.  Wt Readings from Last 3 Encounters:  03/18/21 225 lb (102.1 kg)  03/17/21 224 lb 12.8 oz (102 kg)  03/17/21 223 lb 3.2 oz (101.2 kg)    General: Obese patient appears comfortable at rest. Neck: Supple, no elevated JVP or carotid bruits, no thyromegaly. Lungs: Clear to auscultation, nonlabored breathing at rest. Cardiac: Regular rate and rhythm, no S3 or significant systolic murmur, no pericardial rub. Extremities: No pitting edema, distal pulses 2+. Skin: Warm and dry. Musculoskeletal: No kyphosis. Neuropsychiatric: Alert and oriented x3, affect grossly appropriate.  ECG:    Recent Labwork: 01/13/2021: ALT 30; AST 22; BUN 16; Creatinine, Ser 0.73; Hemoglobin 14.7; Platelets 293.0; Potassium 4.2; Sodium 136; TSH 9.47     Component Value Date/Time   CHOL 277 (H) 01/13/2021 0958   CHOL 244 (H) 08/27/2020 1113   TRIG 196.0 (H) 01/13/2021 08/29/2020  HDL 59.40 01/13/2021 0958   HDL 48 08/27/2020 1113   CHOLHDL 5 01/13/2021 0958   VLDL 39.2 01/13/2021 0958   LDLCALC 178 (H) 01/13/2021 0958   LDLCALC 159 (H) 08/27/2020 1113    Other Studies Reviewed Today:   Eugenie Birks Myoview 03/06/2021 to Study Result  Narrative & Impression  No diagnostic ST segment changes to indicate ischemia. Very small, mild intensity, basal anteroseptal defect that is partially reversible and most consistent with variable soft tissue attenuation. No large ischemic territories. This is a low risk study. Nuclear stress EF: 68%.         05/2019 echo IMPRESSIONS   1.  The left ventricle has hyperdynamic systolic function, with an ejection fraction of >65%. The cavity size was normal. Left ventricular diastolic parameters were normal. No evidence of left ventricular regional wall motion abnormalities.  2. The right ventricle has normal systolic function. The cavity was normal. There is no increase in right ventricular wall thickness.  3. The aortic valve is tricuspid. Mild aortic annular calcification noted.  4. The mitral valve is grossly normal.  5. The tricuspid valve is grossly normal.  6. The aorta is normal unless otherwise noted.     03/2019 event monitor 14 day event monitor Min HR 55, Max HR 161, Avg HR 80 Reported symptoms correlate with sinus rhythm No significant arrhythmias   Assessment and Plan:  1. Palpitations   2. Essential hypertension   3. Mixed hyperlipidemia   4. Chest pain, unspecified type     1. Palpitations Today she did not mention the recent palpitations.  Continue metoprolol 25 mg a.m., metoprolol 50 mg p.m. recent TSH 9.47.  2. Essential hypertension Blood pressure reasonably well controlled on current medications today with BP of 124/88 continue lisinopril 5 mg daily, triamterene/hydrochlorothiazide 37.5/25 mg daily.  3. Mixed hyperlipidemia She was recently started on Repatha and has had 3 injections per her statement.  She is requesting we do a lipid profile to recheck her lipid levels.  She was seeing the lipid clinic but she requests a follow-up lipid panel to recheck her lipid levels..  Please get FLP and LFTs.  4.  Chest pain Recent stress test was considered low risk study per interpreting physician.  She was started on sublingual nitroglycerin at last visit and had taken a couple of doses with some minimal relief.  She continues with ongoing chest pain with and without exertional activity.  We discussed the results of the stress test.  She is still concerned this may be anginal related pain due to her ongoing  chest pain in spite of taking the sublingual nitroglycerin and the low risk study.  We will add Imdur 15 mg daily.  Advised her if she had breakthrough pain to take a sublingual nitroglycerin and if symptoms persist we will speak with her primary cardiologist regarding possible cardiac catheterization.  I discussed thoroughly with her the risk and benefits of cardiac catheterization in the event she has to undergo this study.  She verbalizes understanding. She recently had both an EGD and colonoscopy which were both clear from GI standpoint per her statement.  I advised patient if she continues to have chest pain at next visit I would consult with her primary cardiologist Dr. Wyline Mood about possible cardiac catheterization if he feels this is warranted.  Medication Adjustments/Labs and Tests Ordered: Current medicines are reviewed at length with the patient today.  Concerns regarding medicines are outlined above.   Disposition: Follow-up with Dr. Wyline Mood or  APP 1 month  Signed, Rennis Harding, NP 03/18/2021 12:52 PM    [facility]  [address] [place], [place], [place] [address] Phone: [phone]; Fax: [phone]

## 2021-03-17 NOTE — Progress Notes (Signed)
Patient ID: Amy Lester, female   DOB: 1971-02-13, 50 y.o.   MRN: [id number]           Reason for Appointment: Endocrinology follow-up  Referring PCP: Abbe Amsterdam   History of Present Illness:          Date of diagnosis of type 2 diabetes mellitus:   2018      Background history:   She apparently had been told to have prediabetes prior to diagnosis of diabetes.  However she did not have gestational diabetes with her 3 pregnancies She remembers her A1c being 9% at the time of diagnosis but records are not available She was tried on Metformin but this caused abdominal discomfort and she did not take this and was not given any other treatment She thinks her blood sugars improved with better diet  Recent history:     Non-insulin hypoglycemic drugs the patient is taking are: Metformin 500 mg twice daily, Ozempic 1.0 mg weekly  INSULIN regimen: Tresiba 20 units twice daily  Her A1c is slightly higher at 7.6 done in 5/22  Highest 9.5 9 lowest 6.1  Current management, blood sugar patterns and problems identified: She thinks that her diet has not been consistent and likely eating more carbohydrates or desserts Given with increasing Ozempic she has not lost any weight No side effects with this  She is not able to do much walking because of pain in her foot Does some chair exercises She has checked her blood sugars less frequently apparently She again did not bring her monitor for review and did not keep a record of her blood sugars Blood sugar readings reportedly are consistently over about 180 as before, likely averaging over 200 Again fasting readings are near normal and no hypoglycemia with current dose of Tresiba        Side effects from medications have been: Abdominal distress from Metformin               Glucose monitoring:  done 0-1 times a day         Glucometer:  Generic    Blood Glucose readings by recall recently as below:    PRE-MEAL Fasting Lunch Dinner  Bedtime Overall  Glucose range: 100-110      Mean/median:        POST-MEAL PC Breakfast PC Lunch PC Dinner  Glucose range:   180-220  Mean/median:       Prior  PRE-MEAL Fasting Lunch Dinner Bedtime Overall  Glucose range:  100-115      Mean/median:     ?   POST-MEAL PC Breakfast PC Lunch PC Dinner  Glucose range:    170-250  Mean/median:       Dietician visit, most recent: 06/14/2020  Weight history:  Wt Readings from Last 3 Encounters:  03/17/21 224 lb 12.8 oz (102 kg)  03/17/21 223 lb 3.2 oz (101.2 kg)  03/13/21 223 lb (101.2 kg)    Glycemic control:   Lab Results  Component Value Date   HGBA1C 7.6 (H) 01/13/2021   HGBA1C 6.7 (A) 10/29/2020   HGBA1C 6.1 (A) 07/15/2020   Lab Results  Component Value Date   MICROALBUR 1.6 01/13/2021   LDLCALC 178 (H) 01/13/2021   CREATININE 0.73 01/13/2021   Lab Results  Component Value Date   MICRALBCREAT 0.9 01/13/2021    No results found for: FRUCTOSAMINE  HYPOTHYROIDISM: See review of systems    No visits with results within 1 Week(s) from this visit.  Latest  known visit with results is:  Hospital Outpatient Visit on 02/19/2021  Component Date Value Ref Range Status   Rest HR 03/06/2021 75  bpm Final   Rest BP 03/06/2021 130/86  mmHg Final   Peak HR 03/06/2021 115  bpm Final   Peak BP 03/06/2021 133/86  mmHg Final   SSS 03/06/2021 2   Final   SRS 03/06/2021 0   Final   SDS 03/06/2021 2   Final   LHR 03/06/2021 0.42   Final   TID 03/06/2021 1.14   Final   LV sys vol 03/06/2021 23  mL Final   LV dias vol 03/06/2021 73  46 - 106 mL Final    Allergies as of 03/17/2021       Reactions   Crestor [rosuvastatin]    myalgia   Lipitor [atorvastatin]    myalgia   Percocet [oxycodone-acetaminophen] Nausea And Vomiting   Zocor [simvastatin]    myalgia        Medication List        Accurate as of March 17, 2021  3:48 PM. If you have any questions, ask your nurse or doctor.          STOP taking these  medications    dicyclomine 10 MG capsule Commonly known as: BENTYL Stopped by: Letta Median, PA-C   FERROUS SULFATE PO Stopped by: Letta Median, PA-C       TAKE these medications    acetaminophen 500 MG tablet Commonly known as: TYLENOL Take 500 mg by mouth as needed for moderate pain.   Aimovig (140 MG Dose) 70 MG/ML Soaj Generic drug: Erenumab-aooe Inject 140 mg into the skin every 30 (thirty) days. What changed: when to take this   albuterol 108 (90 Base) MCG/ACT inhaler Commonly known as: VENTOLIN HFA Inhale 3 puffs into the lungs every 6 (six) hours as needed for wheezing or shortness of breath. What changed: how much to take   aspirin EC 81 MG tablet Take 1 tablet (81 mg total) by mouth daily. Swallow whole.   B-12 COMPLIANCE INJECTION IJ Inject 1 Dose as directed every 30 (thirty) days.   busPIRone 7.5 MG tablet Commonly known as: BUSPAR Take 7.5 mg by mouth 2 (two) times daily as needed.   calcium carbonate 500 MG chewable tablet Commonly known as: TUMS - dosed in mg elemental calcium Chew 2-3 tablets by mouth daily as needed for indigestion or heartburn.   cetirizine 10 MG tablet Commonly known as: ZYRTEC Take 1 tablet (10 mg total) by mouth daily.   ciprofloxacin 0.3 % ophthalmic solution Commonly known as: CILOXAN Place into both eyes daily.   clotrimazole-betamethasone cream Commonly known as: Lotrisone Apply 1 application topically as needed. What changed: reasons to take this   diclofenac 50 MG tablet Commonly known as: CATAFLAM Take 1 tablet (50 mg total) by mouth 2 (two) times daily.   escitalopram 20 MG tablet Commonly known as: LEXAPRO Take 1 tablet by mouth daily.   estradiol 1.53 MG/SPRAY transdermal spray Commonly known as: EVAMIST Place 3 sprays onto the skin daily.   ezetimibe 10 MG tablet Commonly known as: ZETIA Take 1 tablet (10 mg total) by mouth daily.   glipiZIDE 10 MG tablet Commonly known as:  GLUCOTROL Take 10 mg by mouth daily.   hydrocortisone 2.5 % rectal cream Commonly known as: ANUSOL-HC Place 1 application rectally 2 (two) times daily. Started by: Letta Median, PA-C   ibuprofen 200 MG tablet Commonly known as: ADVIL  Take 600-800 mg by mouth as needed for mild pain or moderate pain.   lisinopril 5 MG tablet Commonly known as: ZESTRIL Take 1 tablet (5 mg total) by mouth daily.   MAGNESIUM OXIDE PO Take 1 tablet by mouth daily.   MELATONIN PO Take 1 capsule by mouth at bedtime as needed (sleep).   metFORMIN 1000 MG tablet Commonly known as: GLUCOPHAGE Take 1,000 mg by mouth 2 (two) times daily.   metoprolol tartrate 50 MG tablet Commonly known as: LOPRESSOR Take 1 tablet (50 mg total) by mouth at bedtime.   metoprolol tartrate 25 MG tablet Commonly known as: LOPRESSOR Take 1 tablet (25 mg total) by mouth in the morning.   metroNIDAZOLE 1 % gel Commonly known as: METROGEL Apply 1 application topically at bedtime.   montelukast 10 MG tablet Commonly known as: SINGULAIR Take 10 mg by mouth every morning.   multivitamin with minerals tablet Take 1 tablet by mouth daily.   naproxen 500 MG tablet Commonly known as: Naprosyn Take 1 tablet (500 mg total) by mouth 2 (two) times daily with a meal. What changed:  when to take this reasons to take this   nitroGLYCERIN 0.4 MG SL tablet Commonly known as: NITROSTAT Place 1 tablet (0.4 mg total) under the tongue every 5 (five) minutes as needed for chest pain.   nystatin cream Commonly known as: MYCOSTATIN Apply 1 application topically daily as needed for dry skin.   omeprazole 40 MG capsule Commonly known as: PRILOSEC Take 1 capsule (40 mg total) by mouth 2 (two) times daily before a meal.   ondansetron 4 MG tablet Commonly known as: ZOFRAN Take 1 tablet (4 mg total) by mouth every 8 (eight) hours as needed for nausea or vomiting. What changed: when to take this   ondansetron 8 MG  tablet Commonly known as: ZOFRAN Take 1 tablet (8 mg total) by mouth every 8 (eight) hours as needed for nausea or vomiting. What changed: Another medication with the same name was changed. Make sure you understand how and when to take each.   Ozempic (1 MG/DOSE) 2 MG/1.5ML Sopn Generic drug: Semaglutide (1 MG/DOSE) Inject 1 mg into the skin once a week. What changed: when to take this   PAZEO OP Place 1 drop into both eyes daily.   RED YEAST RICE PO Take 1 tablet by mouth daily.   Renova 0.02 % Crea Generic drug: Tretinoin (Facial Wrinkles) Apply 1 application topically at bedtime.   rizatriptan 10 MG tablet Commonly known as: Maxalt Take 1 tablet (10 mg total) by mouth 3 (three) times daily as needed for migraine. What changed: when to take this   levothyroxine 100 MCG tablet Commonly known as: SYNTHROID Take 100 mcg by mouth See admin instructions. Take with the to equal 225 mcg, 6 days per week. Skip Sundays What changed: Another medication with the same name was changed. Make sure you understand how and when to take each.   Synthroid 125 MCG tablet Generic drug: levothyroxine Take two tablets (250mg  total) by mouth days 1-6 and skip day 7. What changed:  how much to take how to take this when to take this additional instructions   Tresiba FlexTouch 100 UNIT/ML FlexTouch Pen Generic drug: insulin degludec Inject 20 units under the skin twice daily. What changed:  how much to take how to take this when to take this additional instructions   triamcinolone cream 0.1 % Commonly known as: KENALOG Apply 1 application topically 2 (two) times  daily as needed. What changed: reasons to take this   triamterene-hydrochlorothiazide 37.5-25 MG tablet Commonly known as: MAXZIDE-25 Take 1 tablet by mouth daily.   venlafaxine XR 75 MG 24 hr capsule Commonly known as: Effexor XR Take 1 capsule (75 mg total) by mouth daily with breakfast.   VITAMIN B-2 PO Take 1  tablet by mouth daily.   Vitamin D (Ergocalciferol) 1.25 MG (50000 UNIT) Caps capsule Commonly known as: DRISDOL Take 1 capsule (50,000 Units total) by mouth every 7 (seven) days.   zonisamide 100 MG capsule Commonly known as: ZONEGRAN Take 1 capsule daily        Allergies:  Allergies  Allergen Reactions   Crestor [Rosuvastatin]     myalgia   Lipitor [Atorvastatin]     myalgia   Percocet [Oxycodone-Acetaminophen] Nausea And Vomiting   Zocor [Simvastatin]     myalgia    Past Medical History:  Diagnosis Date   Asthma    Blood transfusion without reported diagnosis    Chronic migraine    Common migraine with intractable migraine 02/16/2019   Diabetes mellitus without complication (HCC)    Endometriosis    Fatty liver    GERD (gastroesophageal reflux disease)    Hyperlipidemia    Hypertension    Hypothyroidism    Incontinence    Irregular heartbeat    Multiple sclerosis (HCC)    Palpitations    Postconcussive syndrome 05/09/2020   Pre-diabetes    Sleep apnea     Past Surgical History:  Procedure Laterality Date   ABDOMINAL HYSTERECTOMY     CESAREAN SECTION     x1   CHOLECYSTECTOMY     COLONOSCOPY WITH PROPOFOL N/A 11/04/2020   Surgeon: Corbin Ade, MD; normal exam.  Repeat in 10 years.   ENDOMETRIAL ABLATION     ESOPHAGOGASTRODUODENOSCOPY (EGD) WITH PROPOFOL N/A 11/04/2020   Surgeon: Corbin Ade, MD; normal exam.   FOOT SURGERY Left 06/2018   foot reconstruction   REPLACEMENT TOTAL KNEE Left     Family History  Problem Relation Age of Onset   Heart attack Mother    Stroke Mother    Cancer Mother        breast cancer   Hyperlipidemia Mother    Hypothyroidism Mother    Cancer Father        lymph nodes, liver cancer   Colon cancer Father        early 72s   Other Daughter        Fine Gold Type II Syndrome and cognetive issues   Memory loss Daughter    Other Son        Fine Gold Syndrome and BPES   Diabetes Son 22       IDDM    Hypothyroidism Sister    Cancer Sister    Hyperthyroidism Neg Hx     Social History:  reports that she has never smoked. She has never used smokeless tobacco. She reports current alcohol use. She reports that she does not use drugs.   Review of Systems  HYPOTHYROIDISM: She initially had symptoms of fatigue, hair loss and difficulties with weight when she was a teenager She also has a strong family history of hypothyroidism She likely has been on thyroid supplements for about 25 years or more.  Fairly consistently  Although she has been on Synthroid mostly about 2 years ago an outside physician told her to take liothyronine also that she did not know why and she did not feel  any better with starting this combination She previously was on levothyroxine 225 mcg and Cytomel 5 mcg for over 2 years  This was changed to bone 250 mcg on her visit in 11/20 Is taking this 6/7 days a week  She is complaining of feeling tired     Her PCP checked her TSH but did not recommend any changes for the higher level as below    Lab Results  Component Value Date   TSH 9.47 (H) 01/13/2021   TSH 1.89 07/15/2020   TSH 0.40 03/18/2020   FREET4 1.04 07/24/2019   FREET4 0.73 02/24/2019   FREET4 0.62 (L) 11/25/2018    Lipid history: She was given Lipitor 10 mg by her PCP but  it caused some upper body muscle aches She is able to get Repatha but has had only 1 injection so far    Lab Results  Component Value Date   CHOL 277 (H) 01/13/2021   HDL 59.40 01/13/2021   LDLCALC 178 (H) 01/13/2021   TRIG 196.0 (H) 01/13/2021   CHOLHDL 5 01/13/2021           Hypertension: Has been present since age 9 Treated with lisinopril, metoprolol and Maxzide and followed by local PCP and cardiologist Blood pressure readings:  BP Readings from Last 3 Encounters:  03/17/21 130/78  03/17/21 130/83  03/13/21 136/80  .  Lab Results  Component Value Date   K 4.2 01/13/2021    On Vitamin D2 ?  50,000 units  weekly, she was apparently given an OTC product by the pharmacist, has vitamin D deficiency on most recent lab work  Currently known complications of diabetes:?  Neuropathy  LABS:  No visits with results within 1 Week(s) from this visit.  Latest known visit with results is:  Hospital Outpatient Visit on 02/19/2021  Component Date Value Ref Range Status   Rest HR 03/06/2021 75  bpm Final   Rest BP 03/06/2021 130/86  mmHg Final   Peak HR 03/06/2021 115  bpm Final   Peak BP 03/06/2021 133/86  mmHg Final   SSS 03/06/2021 2   Final   SRS 03/06/2021 0   Final   SDS 03/06/2021 2   Final   LHR 03/06/2021 0.42   Final   TID 03/06/2021 1.14   Final   LV sys vol 03/06/2021 23  mL Final   LV dias vol 03/06/2021 73  46 - 106 mL Final    Physical Examination:  BP 130/78 (BP Location: Left Arm, Patient Position: Sitting, Cuff Size: Large)   Pulse 90   Ht 5\' 4"  (1.626 m)   Wt 224 lb 12.8 oz (102 kg)   SpO2 95%   BMI 38.59 kg/m       ASSESSMENT:  Diabetes type 2 with obesity  See history of present illness for detailed discussion of current diabetes management, blood sugar patterns and problems identified  A1c is relatively higher at 7.6 done 2 months ago  Current treatment regimen is twice a day Tresiba insulin 20 units and 1 mg weekly Ozempic along with 1 g Metformin  She is still able to obtain her Ozempic and Evaristo Bury consistently with patient assistance program  Although her fasting readings are consistently controlled she appears to have consistently high postprandial readings when they are checked in the evening Has not monitored consistently No recent weight loss  Still using a generic monitor and actual readings are not available  HYPOTHYROIDISM: She has a high TSH but not clear if  this was related to inconsistent regimen, she thinks she has taken her Synthroid more regularly   PLAN:    Today discussed in detail the need for mealtime insulin to cover postprandial  spikes, action of mealtime insulin, use of the insulin pen, timing and action of the rapid acting insulin as well as starting dose and dosage titration to target the two-hour reading of under 180   She will start with 8 units of HUMALOG if she is able to get this through her patient assistance program Detailed instructions on how to take this and also adjust the dose based on 2-hour blood sugars with a target of 120-180 If she has high readings after breakfast and lunch she will also add mealtime shots for those meals No change in [place] unless morning sugars are starting to get lower  Also discussed freestyle libre and she will try to get this through her patient assistance/indigent program locally  Blood pressure and renal function to be followed by cardiologist  Hypothyroidism: She will have TSH given rechecked  There are no Patient Instructions on file for this visit.      Reather LittlerAjay Emiline Mancebo 03/17/2021, 3:48 PM   Note: This office note was prepared with Dragon voice recognition system technology. Any transcriptional errors that result from this process are unintentional.

## 2021-03-18 ENCOUNTER — Encounter: Payer: Self-pay | Admitting: Family Medicine

## 2021-03-18 ENCOUNTER — Ambulatory Visit (INDEPENDENT_AMBULATORY_CARE_PROVIDER_SITE_OTHER): Payer: Self-pay | Admitting: Family Medicine

## 2021-03-18 ENCOUNTER — Ambulatory Visit (HOSPITAL_COMMUNITY)
Admission: RE | Admit: 2021-03-18 | Discharge: 2021-03-18 | Disposition: A | Discharge status: Home / Self Care | Payer: Self-pay | Source: Ambulatory Visit | Attending: Orthopedic Surgery | Admitting: Orthopedic Surgery

## 2021-03-18 ENCOUNTER — Ambulatory Visit (HOSPITAL_COMMUNITY)
Admission: RE | Admit: 2021-03-18 | Discharge: 2021-03-18 | Disposition: A | Discharge status: Home / Self Care | Payer: Self-pay | Source: Ambulatory Visit | Attending: Gastroenterology | Admitting: Gastroenterology

## 2021-03-18 VITALS — BP 124/88 | HR 76 | Ht 64.0 in | Wt 225.0 lb

## 2021-03-18 DIAGNOSIS — R002 Palpitations: Secondary | ICD-10-CM

## 2021-03-18 DIAGNOSIS — R1032 Left lower quadrant pain: Secondary | ICD-10-CM | POA: Insufficient documentation

## 2021-03-18 DIAGNOSIS — I10 Essential (primary) hypertension: Secondary | ICD-10-CM

## 2021-03-18 DIAGNOSIS — R079 Chest pain, unspecified: Secondary | ICD-10-CM

## 2021-03-18 DIAGNOSIS — E782 Mixed hyperlipidemia: Secondary | ICD-10-CM

## 2021-03-18 DIAGNOSIS — M19071 Primary osteoarthritis, right ankle and foot: Secondary | ICD-10-CM | POA: Insufficient documentation

## 2021-03-18 LAB — POCT I-STAT CREATININE: Creatinine, Ser: 0.8 mg/dL (ref 0.44–1.00)

## 2021-03-18 IMAGING — CT CT ANKLE*R* W/O CM
3 series · 12 of 33 positions shown, 14 images · non-contrast
Comparison: MRI right ankle dated [DATE].

CLINICAL DATA: Worsening right ankle pain over the past several
weeks. No injury.

EXAM:
CT OF THE RIGHT ANKLE WITHOUT CONTRAST
TECHNIQUE: Multidetector CT imaging of the right ankle was performed according
to the standard protocol. Multiplanar CT image reconstructions were
also generated.

[Series 8: axial st · axial · 0.27mm/px · z∈[-1560,-1443]mm · 4 of 172 slices shown, 5 images]
[im 27/172  soft-tissue]
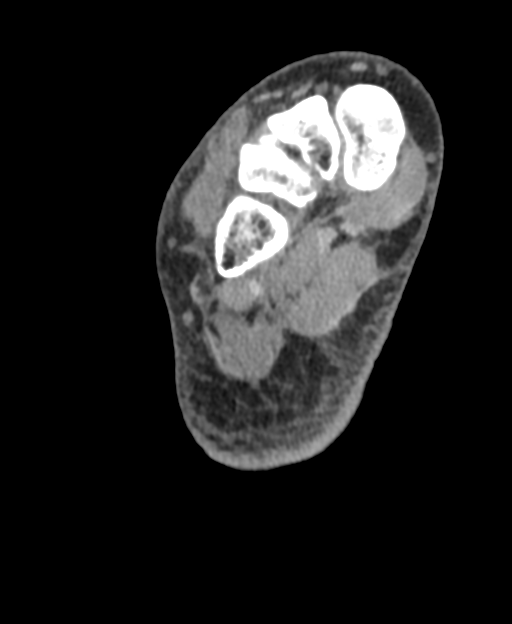
[im 27/172  bone]
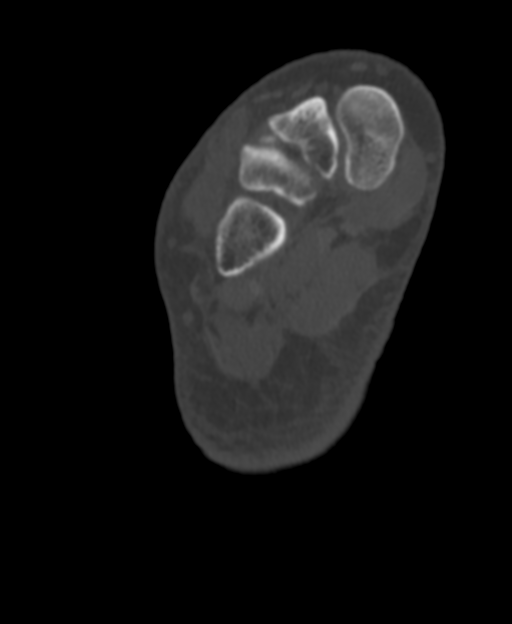
[im 66/172  bone]
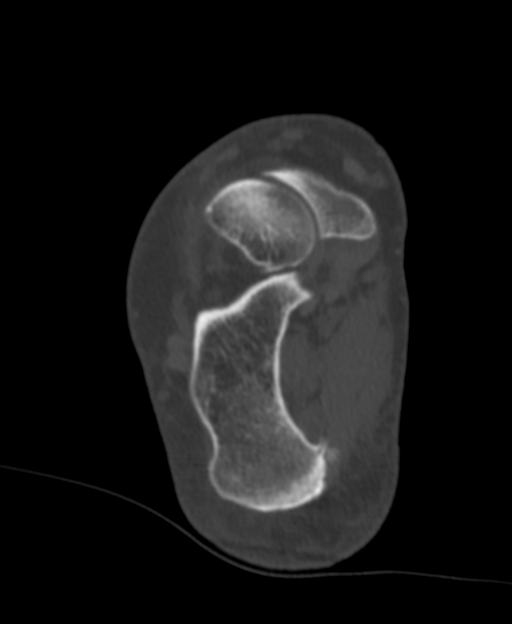
[im 106/172  bone]
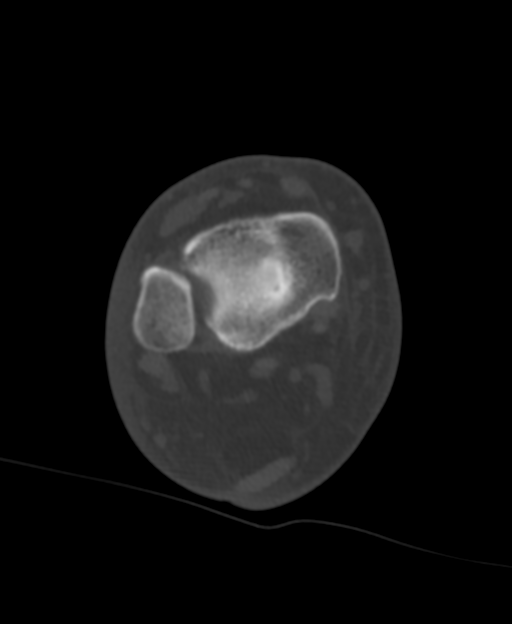
[im 145/172  bone]
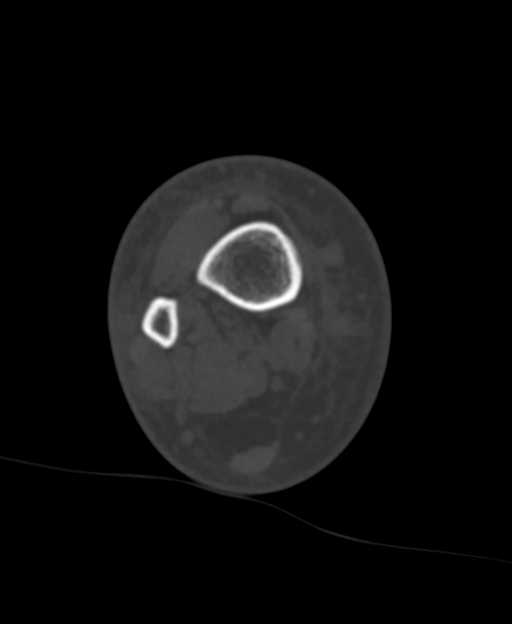

[Series 9: cor st · coronal · 0.25mm/px · 3 of 148 slices shown]
[im 30/148  bone]
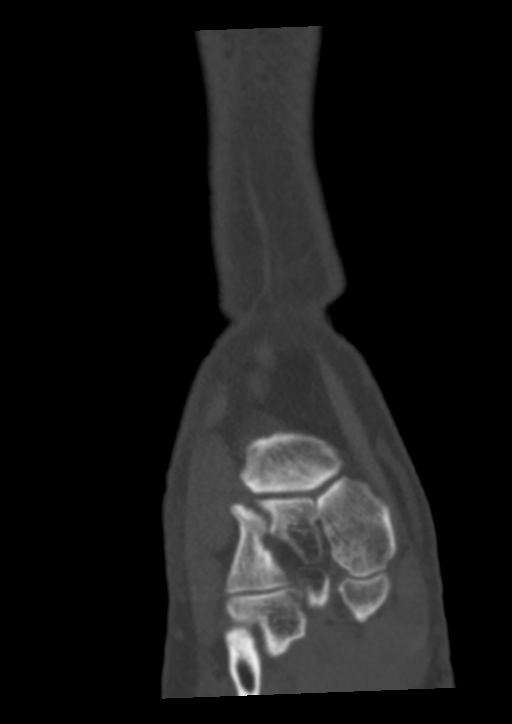
[im 59/148  bone]
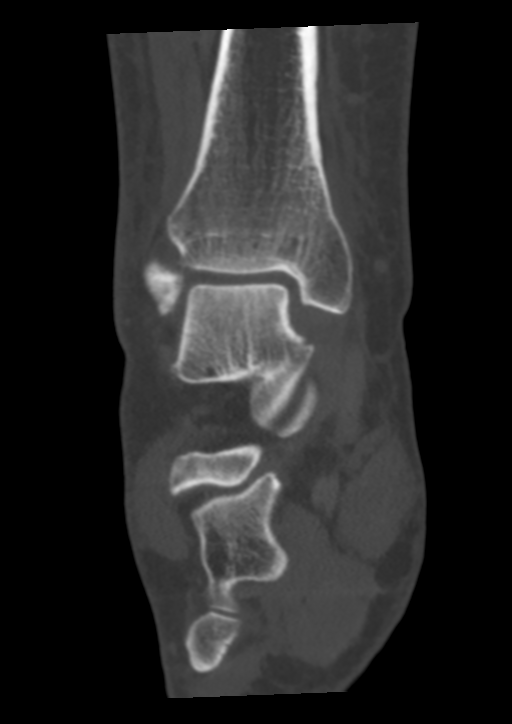
[im 89/148  bone]
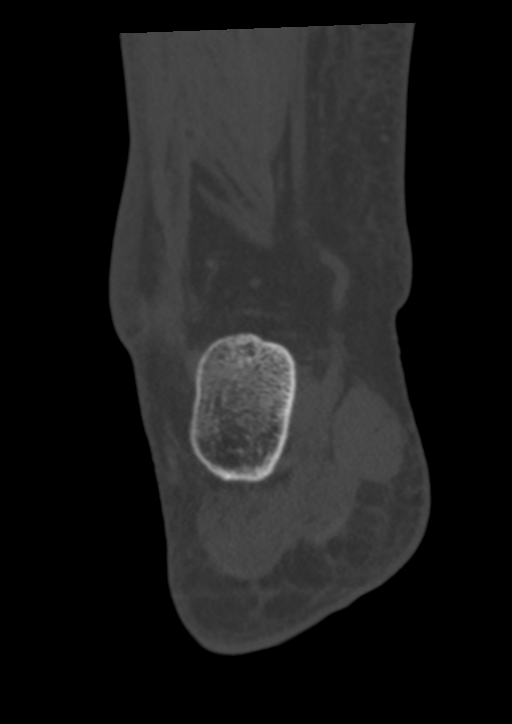

[Series 10: sag st · sagittal · 0.28mm/px · 5 of 92 slices shown, 6 images]
[im 31/92  bone]
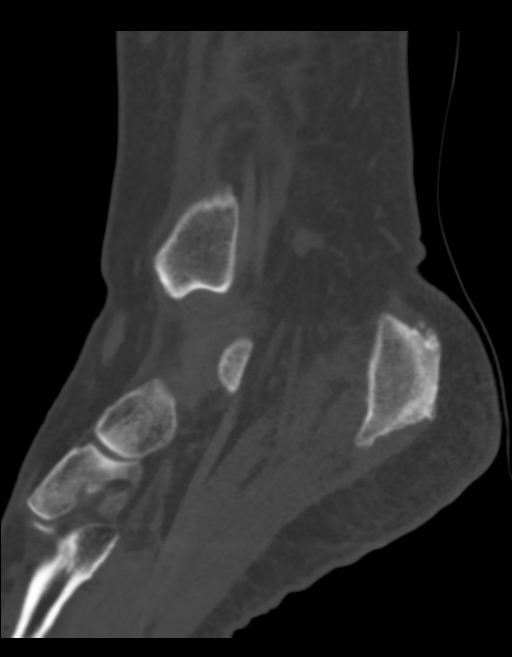
[im 38/92  bone]
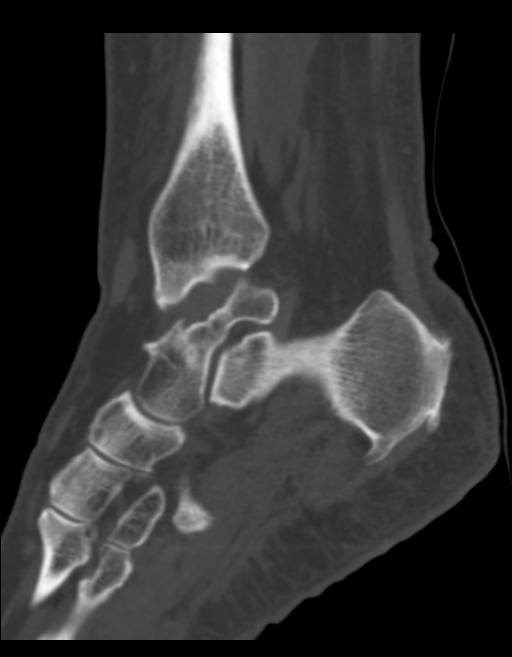
[im 46/92  soft-tissue]
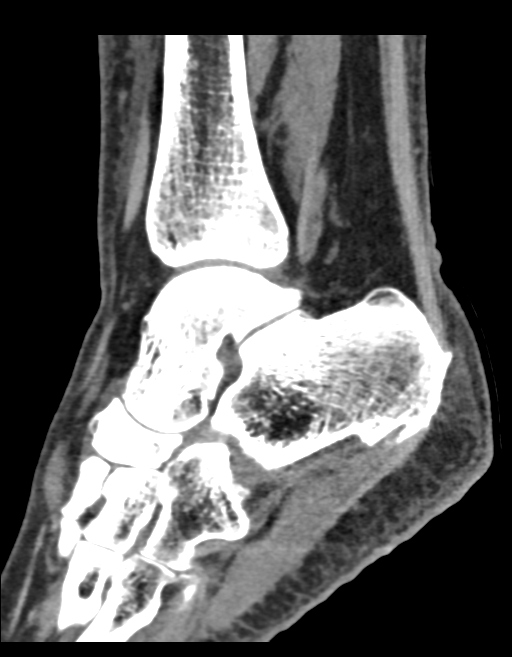
[im 46/92  bone]
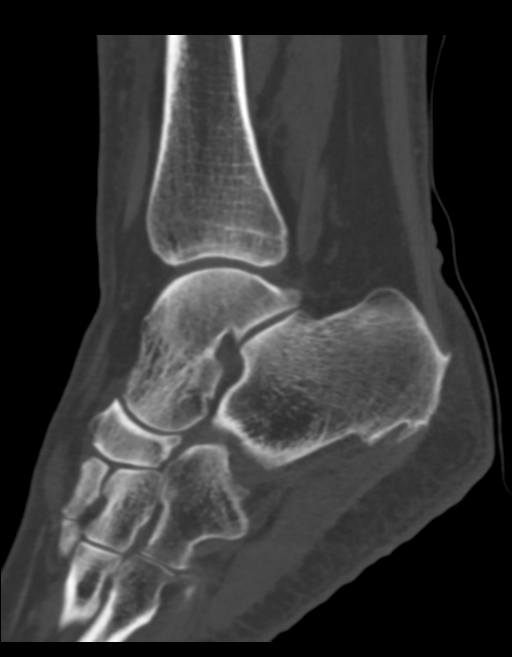
[im 54/92  bone]
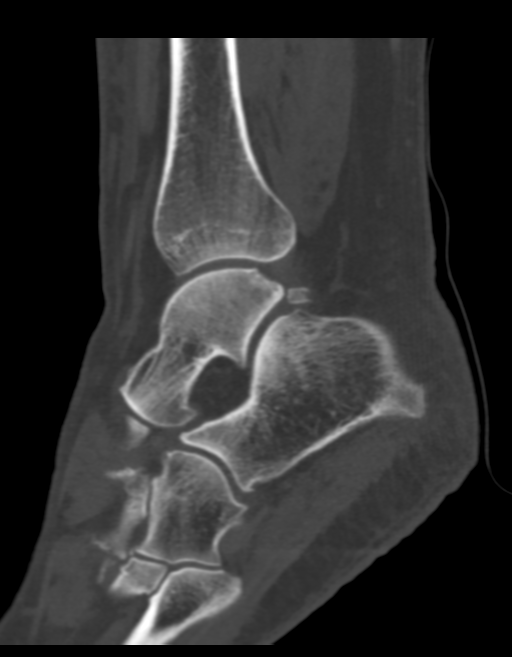
[im 61/92  bone]
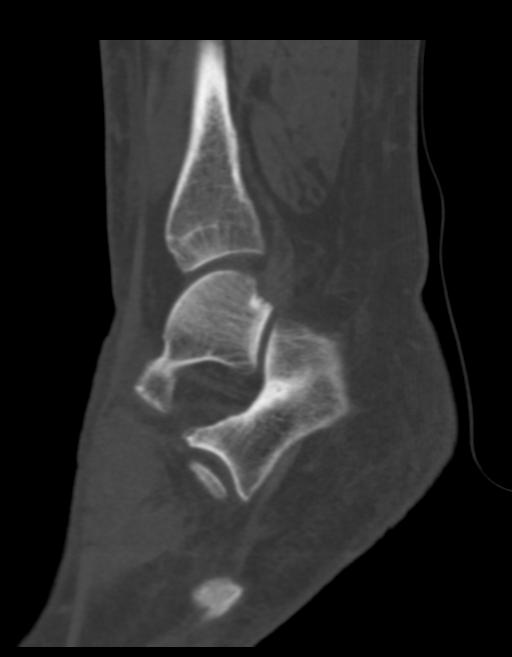

[12 of 33 positions shown; findings below may reference images not displayed]

FINDINGS: Bones/Joint/Cartilage

No fracture or dislocation. The ankle mortise is symmetric. The
talar dome is intact. Small marginal osteophytes involving the
talonavicular, navicular-lateral cuneiform, and calcaneocuboid
joints. Joint spaces are relatively preserved. No joint effusion. Os
trigonum.

Ligaments

Ligaments are suboptimally evaluated by CT.

Muscles and Tendons
Grossly intact.

Soft tissue
Distal lower leg soft tissue swelling. No fluid collection or
hematoma. No soft tissue mass. Preserved fat within the sinus tarsi.
IMPRESSION: 1.  No acute osseous abnormality.
2. Mild talonavicular, calcaneocuboid, and navicular-lateral
cuneiform osteoarthritis.

## 2021-03-18 IMAGING — CT CT ABD-PELV W/ CM
2 of 5 series · 16 of 46 positions shown, 18 images · IV contrast (Omnipaque or Isovue)
Comparison: [DATE]
COMPARISON: [DATE]

Addendum:
CLINICAL DATA: Chronic LEFT lower quadrant abdominal pain, history
elevated LFTs, hemorrhoids

EXAM:
CT ABDOMEN AND PELVIS WITH CONTRAST
TECHNIQUE: Multidetector CT imaging of the abdomen and pelvis was performed
using the standard protocol following bolus administration of
intravenous contrast. Sagittal and coronal MPR images reconstructed
from axial data set.
CONTRAST:  100mL OMNIPAQUE IOHEXOL 300 MG/ML SOLN IV. Dilute oral
contrast.

[Series 1: axial st · axial · 0.84mm/px · z∈[-774,-359]mm · 13 of 93 slices shown, 15 images]
[im 5/93  soft-tissue]
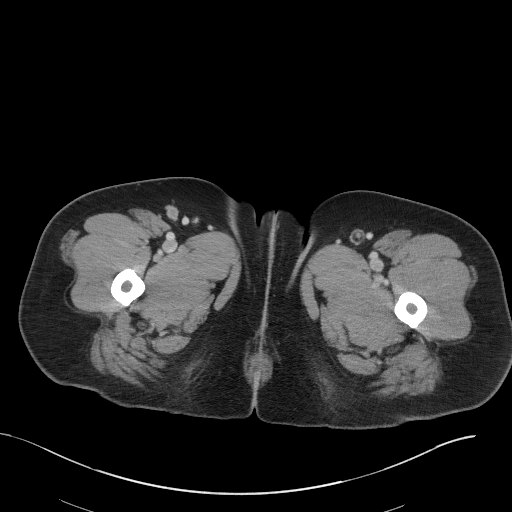
[im 5/93  bone]
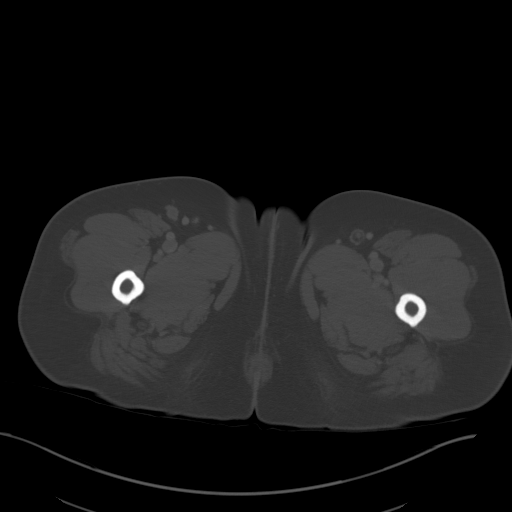
[im 15/93  soft-tissue]
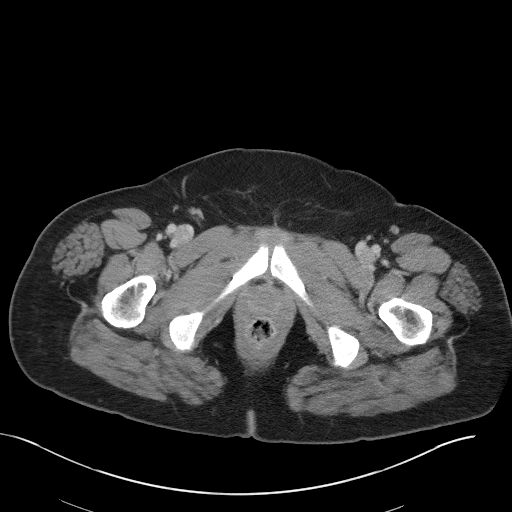
[im 20/93  soft-tissue]
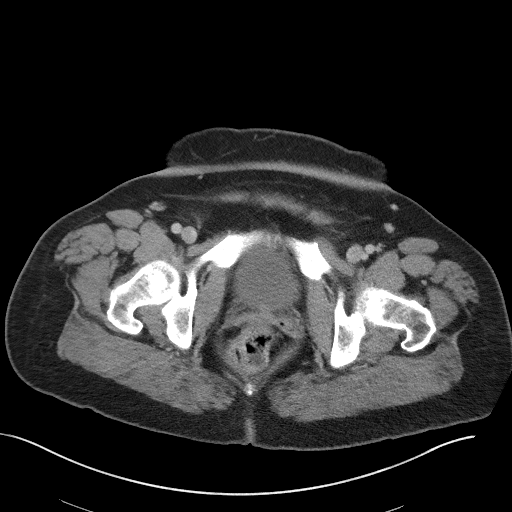
[im 25/93  soft-tissue]
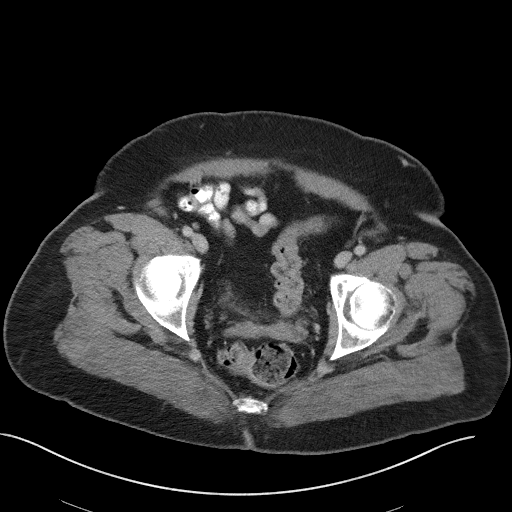
[im 34/93  soft-tissue]
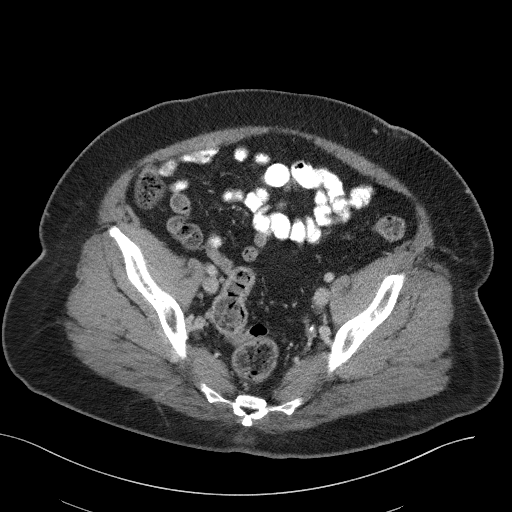
[im 39/93  soft-tissue]
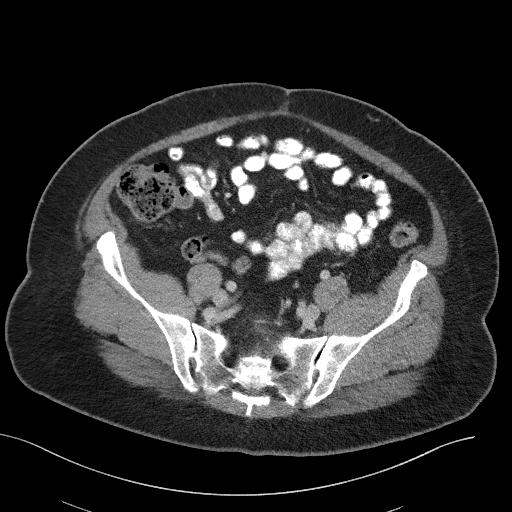
[im 49/93  soft-tissue]
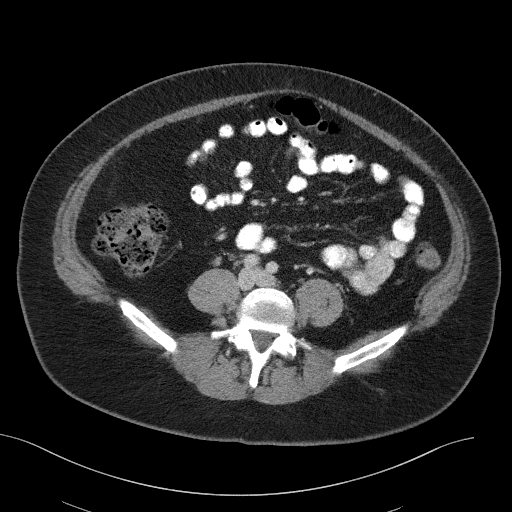
[im 54/93  soft-tissue]
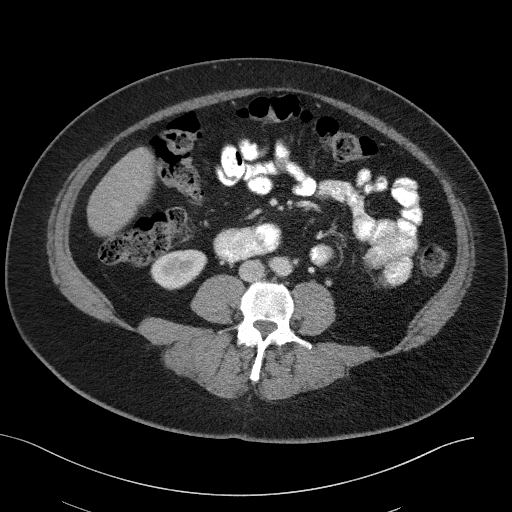
[im 59/93  soft-tissue]
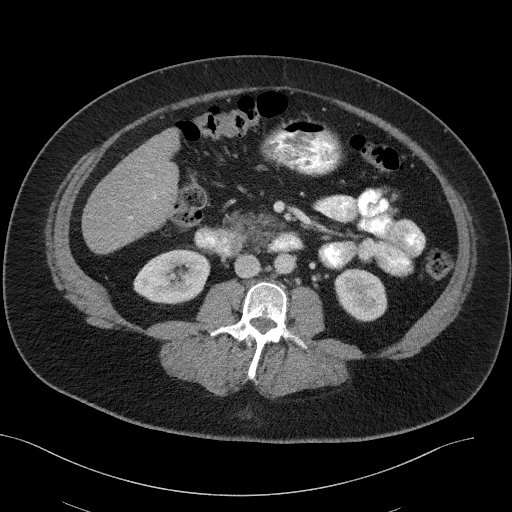
[im 59/93  bone]
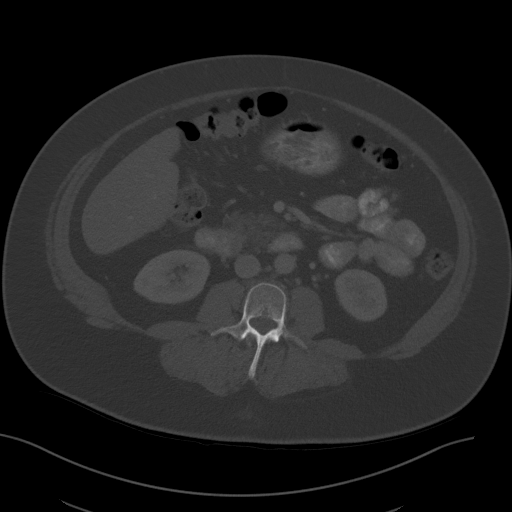
[im 68/93  soft-tissue]
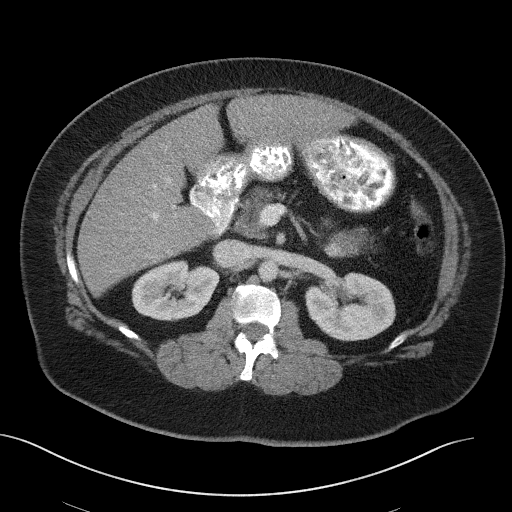
[im 73/93  soft-tissue]
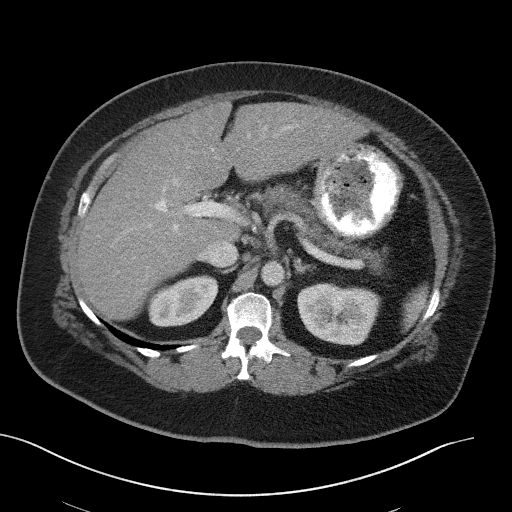
[im 78/93  soft-tissue]
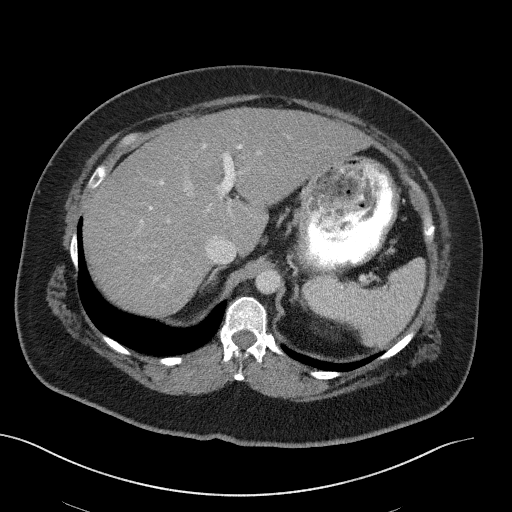
[im 88/93  soft-tissue]
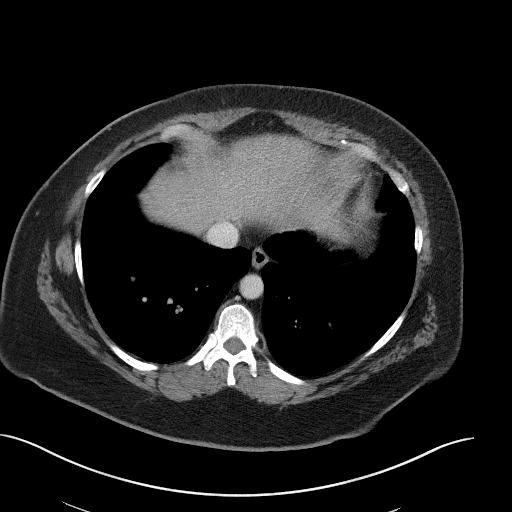

[Series 6: coronal st · coronal · 0.90mm/px · 3 of 117 slices shown]
[im 39/117  soft-tissue]
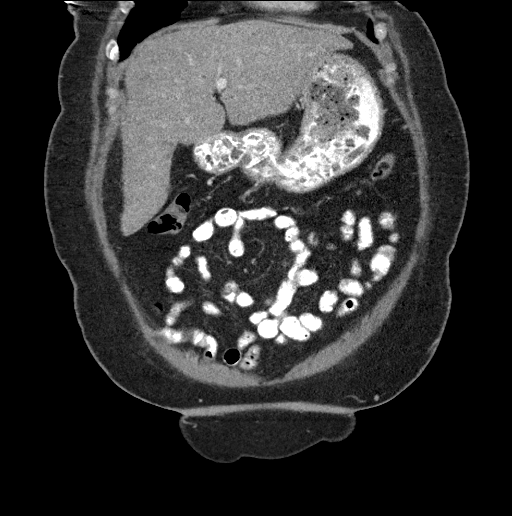
[im 52/117  soft-tissue]
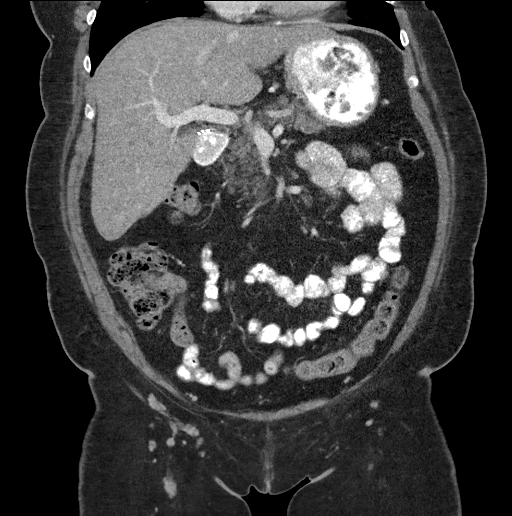
[im 65/117  soft-tissue]
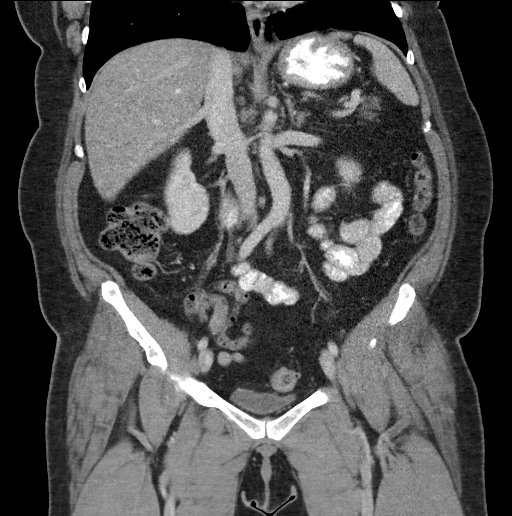

[16 of 46 positions shown; findings below may reference images not displayed]

FINDINGS: Lower chest: Lung bases clear

Hepatobiliary: Diffuse fatty infiltration of liver. Gallbladder
surgically absent. No focal hepatic abnormalities.

Pancreas: Normal appearance

Spleen: Normal appearance

Adrenals/Urinary Tract: Adrenal glands, kidneys, ureters, and
bladder normal appearance

Stomach/Bowel: Food debris in stomach. Appendix not identified, no
pericecal inflammatory process seen. Bowel loops normal appearance.

Vascular/Lymphatic: Vascular structures patent. Aorta normal
caliber. No adenopathy.

Reproductive: Uterus surgically absent with nonvisualization of
ovaries

Other: No free air or free fluid. No hernia or inflammatory process.

Musculoskeletal: Unremarkable
IMPRESSION: Fatty infiltration of liver.

Post cholecystectomy and hysterectomy.

No acute intra-abdominal or intrapelvic abnormalities.

ADDENDUM:
Additional images were located on a separate exam and were
transferred to the study for interpretation. Kidneys normal
appearance. No evidence of renal mass or collecting system filling
defect on delayed images. Single nonobstructed ureters without
hydroureteronephrosis. No additional findings.

*** End of Addendum ***
FINDINGS: Lower chest: Lung bases clear

Hepatobiliary: Diffuse fatty infiltration of liver. Gallbladder
surgically absent. No focal hepatic abnormalities.

Pancreas: Normal appearance

Spleen: Normal appearance

Adrenals/Urinary Tract: Adrenal glands, kidneys, ureters, and
bladder normal appearance

Stomach/Bowel: Food debris in stomach. Appendix not identified, no
pericecal inflammatory process seen. Bowel loops normal appearance.

Vascular/Lymphatic: Vascular structures patent. Aorta normal
caliber. No adenopathy.

Reproductive: Uterus surgically absent with nonvisualization of
ovaries

Other: No free air or free fluid. No hernia or inflammatory process.

Musculoskeletal: Unremarkable
IMPRESSION: Fatty infiltration of liver.

Post cholecystectomy and hysterectomy.

No acute intra-abdominal or intrapelvic abnormalities.

## 2021-03-18 MED ORDER — ISOSORBIDE MONONITRATE ER 30 MG PO TB24: 15.0000 mg | ORAL_TABLET | Freq: Every day | ORAL | 6 refills | Status: DC

## 2021-03-18 MED ORDER — IOHEXOL 300 MG/ML  SOLN
100.0000 mL | Freq: Once | INTRAMUSCULAR | Status: AC | PRN
  Administered 2021-03-18: 100 mL via INTRAVENOUS

## 2021-03-18 NOTE — Patient Instructions (Signed)
Medication Instructions:  Begin Imdur 15mg  daily.  Continue all other medications.    Labwork: FLP, LFT - orders given today.  Office will contact with results via phone or letter.    Testing/Procedures: none  Follow-Up: 1 month   Any Other Special Instructions Will Be Listed Below (If Applicable).  If you need a refill on your cardiac medications before your next appointment, please call your pharmacy.

## 2021-03-19 ENCOUNTER — Ambulatory Visit (INDEPENDENT_AMBULATORY_CARE_PROVIDER_SITE_OTHER): Payer: Self-pay

## 2021-03-19 ENCOUNTER — Other Ambulatory Visit: Payer: Self-pay

## 2021-03-19 DIAGNOSIS — E538 Deficiency of other specified B group vitamins: Secondary | ICD-10-CM

## 2021-03-19 MED ORDER — CYANOCOBALAMIN 1000 MCG/ML IJ SOLN
1000.0000 ug | Freq: Once | INTRAMUSCULAR | Status: AC
  Administered 2021-03-19: 1000 ug via INTRAMUSCULAR

## 2021-03-19 NOTE — Progress Notes (Signed)
Per orders from Dr Salomon Fick, patient was given B12 injection by Steffanie Rainwater, patient tolerated well.

## 2021-03-20 ENCOUNTER — Ambulatory Visit: Payer: Self-pay | Admitting: Gastroenterology

## 2021-03-21 ENCOUNTER — Other Ambulatory Visit (HOSPITAL_COMMUNITY): Payer: Medicaid Other

## 2021-03-24 ENCOUNTER — Other Ambulatory Visit: Payer: Self-pay | Admitting: *Deleted

## 2021-03-24 DIAGNOSIS — R1032 Left lower quadrant pain: Secondary | ICD-10-CM

## 2021-03-25 ENCOUNTER — Ambulatory Visit: Payer: Self-pay

## 2021-03-25 ENCOUNTER — Ambulatory Visit (INDEPENDENT_AMBULATORY_CARE_PROVIDER_SITE_OTHER): Payer: Medicaid Other | Admitting: Orthopedic Surgery

## 2021-03-25 ENCOUNTER — Other Ambulatory Visit: Payer: Self-pay

## 2021-03-25 ENCOUNTER — Encounter: Payer: Self-pay | Admitting: Orthopedic Surgery

## 2021-03-25 VITALS — Ht 64.0 in | Wt 225.0 lb

## 2021-03-25 DIAGNOSIS — G8929 Other chronic pain: Secondary | ICD-10-CM

## 2021-03-25 DIAGNOSIS — M25571 Pain in right ankle and joints of right foot: Secondary | ICD-10-CM

## 2021-03-25 DIAGNOSIS — M25562 Pain in left knee: Secondary | ICD-10-CM

## 2021-03-25 DIAGNOSIS — M6701 Short Achilles tendon (acquired), right ankle: Secondary | ICD-10-CM

## 2021-03-25 NOTE — Progress Notes (Signed)
Office Visit Note   Patient: Amy Lester           Date of Birth: 1970/12/25           MRN: [id number] Visit Date: 03/25/2021              Requested by: Vickki Hearing, MD [address] [place],  [place] [address] PCP: Deeann Saint, MD  Chief Complaint  Patient presents with   Left Knee - Pain   Right Ankle - Pain      HPI: Patient is a 50 year old woman who was seen for initial evaluation of chronic left knee pain and right foot and ankle pain.  Patient states that she is status post ligamentous reconstruction of the left ankle at [facility].  She states she was in skilled nursing postoperatively and states that she has not been doing any activities on her ankle stating that she was told to take it easy.  Patient is also status post arthroscopic debridement of her left knee in 1988.  She states she has global knee pain throughout the left knee.  Patient states her symptoms are best in the morning and states she has been having increased swelling in the right foot and has been wearing an elastic ankle brace.  She states that the right foot pain has been worse for the past 3 weeks.  She is status post a CT scan of the right ankle at [facility] July 22.  Patient states she has a history of MS.  Assessment & Plan: Visit Diagnoses:  1. Chronic pain of left knee   2. Pain in right ankle and joints of right foot   3. Achilles tendon contracture, right     Plan: Discussed with the patient that we will progress in a step by step fashion to improve her symptoms.  Recommended starting with Voltaren gel 3 times a day for the left knee and right ankle.  Also discussed importance and demonstrated Achilles stretching to do 5 times a day a minute at a time.  Discussed that once we improve the range of motion we would proceed with strengthening.  Also discussed patient may require an intra-articular injection in the knee and ankle.  Follow-Up Instructions: No follow-ups on file.    Ortho Exam  Patient is alert, oriented, no adenopathy, well-dressed, normal affect, normal respiratory effort. Examination patient has a good pulse in the right foot with her knee extended she has dorsiflexion 20 degrees short of neutral with Achilles tightness.  Patient is exquisitely tender to palpation over the Achilles there are no nodules no defects.  Patient is also tender to palpation of the anterior lateral joint line of the ankle.  She has good ankle and subtalar motion anterior drawer is stable.  Examination the left knee there is some tenderness to palpation of the patellofemoral joint as well as medial lateral joint line collateral cruciates are stable.  There is no effusion.  I reviewed the CT scan which shows no bony abnormalities of the ankle or hindfoot.  Imaging: No results found. No images are attached to the encounter.  Labs: Lab Results  Component Value Date   HGBA1C 7.6 (H) 01/13/2021   HGBA1C 6.7 (A) 10/29/2020   HGBA1C 6.1 (A) 07/15/2020   REPTSTATUS 07/25/2020 FINAL 07/24/2020   CULT (A) 07/24/2020    <10,000 COLONIES/mL INSIGNIFICANT GROWTH Performed at [facility], [address], [place], [place] [address]      Lab Results  Component Value Date   ALBUMIN 4.6 01/13/2021   ALBUMIN 3.8 12/13/2020   ALBUMIN 4.4 08/27/2020    No results found for: MG Lab Results  Component Value Date   VD25OH 22.71 (L) 01/13/2021    No results found for: PREALBUMIN CBC EXTENDED Latest Ref Rng & Units 01/13/2021 07/24/2020 02/13/2020  WBC 4.0 - 10.5 K/uL 7.9 7.7 8.0  RBC 3.87 - 5.11 Mil/uL 4.94 4.67 4.52  HGB 12.0 - 15.0 g/dL 28.7 86.7 67.2  HCT 09.4 - 46.0 % 43.2 43.3 41.5  PLT 150.0 - 400.0 K/uL 293.0 358 304  NEUTROABS 1.4 - 7.7 K/uL 5.5 4.9 4.4  LYMPHSABS 0.7 - 4.0 K/uL 1.8 2.0 2.6     Body mass index is 38.62 kg/m.  Orders:  Orders Placed This Encounter  Procedures   XR Knee 1-2 Views Left   No orders of the defined types were placed in this  encounter.    Procedures: No procedures performed  Clinical Data: No additional findings.  ROS:  All other systems negative, except as noted in the HPI. Review of Systems  Objective: Vital Signs: Ht 5\' 4"  (1.626 m)   Wt 225 lb (102.1 kg)   BMI 38.62 kg/m   Specialty Comments:  No specialty comments available.  PMFS History: Patient Active Problem List   Diagnosis Date Noted   Hemorrhoids 03/17/2021   Fatigue 03/17/2021   LLQ abdominal pain 03/17/2021   Vitamin D deficiency 01/15/2021   Non-intractable vomiting 12/13/2020   Flatulence 12/13/2020   White matter abnormality on MRI of brain 09/11/2020   Hyperlipidemia 08/28/2020   Nausea without vomiting 07/25/2020   Postconcussive syndrome 05/09/2020   Elevated LFTs 04/19/2020   Abdominal pain 04/19/2020   Dysuria 04/19/2020   Class 2 severe obesity due to excess calories with serious comorbidity and body mass index (BMI) of 38.0 to 38.9 in adult (HCC) 04/12/2019   Type 2 diabetes mellitus without complication (HCC) 04/12/2019   Palpitations 04/12/2019   Mild intermittent asthma without complication 04/12/2019   Seasonal allergies 04/12/2019   Hypothyroidism 04/12/2019   OSA treated with BiPAP 04/12/2019   Gastroesophageal reflux disease 04/12/2019   Essential hypertension 04/12/2019   Intractable chronic migraine without aura 02/16/2019   MS (multiple sclerosis) (HCC) 02/16/2019   Past Medical History:  Diagnosis Date   Asthma    Blood transfusion without reported diagnosis    Chronic migraine    Common migraine with intractable migraine 02/16/2019   Diabetes mellitus without complication (HCC)    Endometriosis    Fatty liver    GERD (gastroesophageal reflux disease)    Hyperlipidemia    Hypertension    Hypothyroidism    Incontinence    Irregular heartbeat    Multiple sclerosis (HCC)    Palpitations    Postconcussive syndrome 05/09/2020   Pre-diabetes    Sleep apnea     Family History  Problem  Relation Age of Onset   Heart attack Mother    Stroke Mother    Cancer Mother        breast cancer   Hyperlipidemia Mother    Hypothyroidism Mother    Cancer Father        lymph nodes, liver cancer   Colon cancer Father        early 40s   Other Daughter        Fine Gold Type II Syndrome and cognetive issues   Memory loss Daughter    Other Son  Fine Gold Syndrome and BPES   Diabetes Son 22       IDDM   Hypothyroidism Sister    Cancer Sister    Hyperthyroidism Neg Hx     Past Surgical History:  Procedure Laterality Date   ABDOMINAL HYSTERECTOMY     CESAREAN SECTION     x1   CHOLECYSTECTOMY     COLONOSCOPY WITH PROPOFOL N/A 11/04/2020   Surgeon: Corbin Ade, MD; normal exam.  Repeat in 10 years.   ENDOMETRIAL ABLATION     ESOPHAGOGASTRODUODENOSCOPY (EGD) WITH PROPOFOL N/A 11/04/2020   Surgeon: Corbin Ade, MD; normal exam.   FOOT SURGERY Left 06/2018   foot reconstruction   REPLACEMENT TOTAL KNEE Left    Social History   Occupational History   Not on file  Tobacco Use   Smoking status: Never   Smokeless tobacco: Never  Vaping Use   Vaping Use: Never used  Substance and Sexual Activity   Alcohol use: Yes    Comment: once a year   Drug use: Never   Sexual activity: Not on file

## 2021-03-26 ENCOUNTER — Other Ambulatory Visit: Payer: Self-pay | Admitting: Gastroenterology

## 2021-03-26 DIAGNOSIS — R1032 Left lower quadrant pain: Secondary | ICD-10-CM

## 2021-03-26 MED ORDER — HYOSCYAMINE SULFATE SL 0.125 MG SL SUBL: SUBLINGUAL_TABLET | SUBLINGUAL | 0 refills | Status: DC

## 2021-03-27 ENCOUNTER — Encounter (HOSPITAL_COMMUNITY): Payer: Self-pay

## 2021-03-27 ENCOUNTER — Encounter (HOSPITAL_COMMUNITY)
Admission: RE | Admit: 2021-03-27 | Discharge: 2021-03-27 | Disposition: A | Discharge status: Home / Self Care | Payer: Self-pay | Source: Ambulatory Visit | Attending: Gastroenterology | Admitting: Gastroenterology

## 2021-03-27 ENCOUNTER — Other Ambulatory Visit: Payer: Self-pay

## 2021-03-27 DIAGNOSIS — R110 Nausea: Secondary | ICD-10-CM | POA: Insufficient documentation

## 2021-03-27 DIAGNOSIS — K30 Functional dyspepsia: Secondary | ICD-10-CM | POA: Insufficient documentation

## 2021-03-27 IMAGING — NM NM GASTRIC EMPTYING
10 series · 10 of 10 positions shown · non-contrast
Comparison: None.

CLINICAL DATA: Nausea, vomiting, abdominal pain

EXAM:
NUCLEAR MEDICINE GASTRIC EMPTYING SCAN
TECHNIQUE: After oral ingestion of radiolabeled meal, sequential abdominal
images were obtained for 3.5 hours. Percentage of activity emptying
the stomach was calculated at 1 hour, 2 hour, 3 hour, and 3.5 hours.
Patient was unable to stay for [HOSPITAL] 4 hours.
RADIOPHARMACEUTICALS:  2.2 mCi [60] none in standardized meal

[Series 1: 0 min · 4.14mm/px · 1 of 1 slices shown (1 of 2)]
[im 1/1]
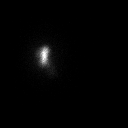

[Series 1: 0 min · 4.14mm/px · 1 of 1 slices shown (2 of 2)]
[im 1/1]
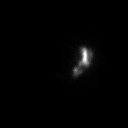

[Series 2: 60 min · 4.14mm/px · 1 of 1 slices shown (1 of 2)]
[im 1/1]
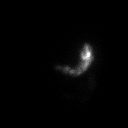

[Series 2: 60 min · 4.14mm/px · 1 of 1 slices shown (2 of 2)]
[im 1/1]
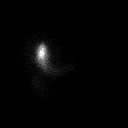

[Series 3: 120 min · 4.14mm/px · 1 of 1 slices shown (1 of 2)]
[im 1/1]
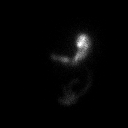

[Series 3: 120 min · 4.14mm/px · 1 of 1 slices shown (2 of 2)]
[im 1/1]
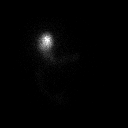

[Series 4: 180 min · 4.14mm/px · 1 of 1 slices shown (1 of 2)]
[im 1/1]
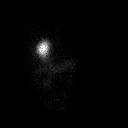

[Series 4: 180 min · 4.14mm/px · 1 of 1 slices shown (2 of 2)]
[im 1/1]
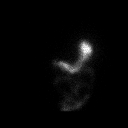

[Series 5: 200 min · 4.14mm/px · 1 of 1 slices shown (1 of 2)]
[im 1/1]
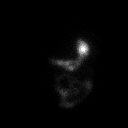

[Series 5: 200 min · 4.14mm/px · 1 of 1 slices shown (2 of 2)]
[im 1/1]
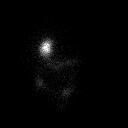

[10 of 10 positions shown; findings below may reference images not displayed]

FINDINGS: Expected location of the stomach in the left upper quadrant.

Ingested meal empties the stomach abnormally slowly over the course
of the study.

10% emptied at 1 hr ( normal >= 10%)

19% emptied at 2 hr ( normal >= 40%)

29% emptied at 3 hr ( normal >= 70%)

38% emptied at 3.5 hr (normal >= 90% at 4 hours)
IMPRESSION: Delayed gastric emptying.

## 2021-03-27 MED ORDER — TECHNETIUM TC 99M SULFUR COLLOID
2.0000 | Freq: Once | INTRAVENOUS | Status: AC | PRN
  Administered 2021-03-27: 2.2 via ORAL

## 2021-03-31 MED ORDER — OMEPRAZOLE 40 MG PO CPDR: 40.0000 mg | DELAYED_RELEASE_CAPSULE | Freq: Two times a day (BID) | ORAL | 3 refills | Status: AC

## 2021-03-31 MED ORDER — ONDANSETRON HCL 8 MG PO TABS: 8.0000 mg | ORAL_TABLET | Freq: Three times a day (TID) | ORAL | 1 refills | Status: DC | PRN

## 2021-03-31 MED ORDER — HYDROCORTISONE (PERIANAL) 2.5 % EX CREA: 1.0000 "application " | TOPICAL_CREAM | Freq: Two times a day (BID) | CUTANEOUS | 1 refills | Status: DC

## 2021-03-31 NOTE — Addendum Note (Signed)
Addended by: Noreene Larsson on: 03/31/2021 12:01 PM   Modules accepted: Orders

## 2021-04-03 ENCOUNTER — Encounter: Payer: Self-pay | Admitting: *Deleted

## 2021-04-14 ENCOUNTER — Telehealth: Payer: Self-pay

## 2021-04-14 NOTE — Telephone Encounter (Signed)
Records request received from [facility].   Last ov notes faxed over as request.

## 2021-04-21 NOTE — Progress Notes (Deleted)
Cardiology Office Note  Date: 04/21/2021   ID: Amy Lester, DOB [birth year], MRN [id number]  PCP:  Deeann Saint, MD  Cardiologist:  Dina Rich, MD Electrophysiologist:  None   Chief Complaint: Follow-up palpitations, HTN, HLD  History of Present Illness: Amy Lester is a 50 y.o. female with a history of palpitations, HTN, HLD, DM2, NAFLD, GERD, hypothyroidism, MS, OSA.  Last encounter with Dr. Wyline Mood 04/24/2020.  History of palpitations and seen in the ER in the past with episode of palpitations occurring while laying in bed.  Recent increase in symptoms with heavy heartbeats.  Symptoms lasting about 10 minutes.  Feeling weak all over, occurring few times a week.  Had a previous cardiac monitor 3 years prior.  Event monitor July 2020 no arrhythmias.  Had previously changed to Toprol which gave her fatigue.  She went back on Metoprolol 25 mg a.m., 50 mg p.m.  Still having symptoms at times.  Lasting a few minutes.  She was compliant with her statin medication.  Lipids on 02/2019 : TC 246, HDL 45, TG 28, LDL 163.  She had elevated LFTs and was seeing GI.  Later she was off statin.  Had some lower extremity edema, primarily left side.  Echo 05/28/2019 showed EF greater than 65%, indeterminate diastolic function.  He was compliant with antihypertensive medication.    She was here at a prior visit at suggestion of an outpatient clinic due to elevated LDL on recent labs.  She stated her LDL was 199.  Previously on statin medications but had elevated LFTs.  Statin was stopped and she was on Zetia only.  History of fatty liver disease and seeing gastroenterology.  Previously intolerant to statins due to statin associated muscle symptoms.  She continued to have some palpitations which were more noticeable at night when  in bed which were short-lived and she had no other symptoms.  She had what she described as weird symptoms in her arms.  She could feel like her heart was beating in her arms  occasionally.  She denied any anginal symptoms or exertional symptoms.  She is limited limited in ability to exercise due to reconstruction of her ankle/foot.  Her diabetes was much better controlled recently with hemoglobin A1c of 6.1% 2 weeks ago.  LFTs on 07/24/2020: AST 21, ALT 24.  At last visit she stated she was pending receipt of Repatha and was to start it as soon as she received it.  She was started on 140 mg subcu every 2 weeks.  She was of recent chest discomfort substernal and under left breast with exertion.  She denied radiation.  She was having some mild dizziness associated.  She denied any diaphoresis but stated she tended to sweat a lot due to to perimenopausal state.  She was concerned she may have heart disease given significant elevations in her lipid levels and associated risk factors including hypertension, HLD, DM 2.  Recent labs on 01/13/2021 demonstrated elevated glucose of 229, alkaline phosphatase 138, total cholesterol 277, LDL 178, HDL 59.4, triglycerides 196.  TSH of 9.47.,  Hemoglobin A1c 7.6.  She is here to follow-up on her recent Lexiscan stress test.  We discussed the results of the stress test at length.  Stress test was considered low risk per the interpreting physician.  She states she still continues with chest pain which comes and goes and can occur with or without activity.  States she has a strong family history of heart disease and  is continuing to be concerned about her ongoing chest pain.  At last visit he had been given sublingual nitroglycerin and had taken it and had some mild relief.  States she has been taking Repatha for between 6 and 8 weeks and has not had follow-up lipids and she is requesting to have a lipid panel done to see if this has improved.  She currently denies any DOE, palpitations, orthostatic symptoms, CVA or TIA-like symptoms, PND, orthopnea, bleeding.  No claudication-like symptoms, DVT or PE-like symptoms, or lower extremity edema.  Ordered  lab work at last office visit on 03/18/2021 lipid, hepatic panel, TSH.  It appears that these have not been drawn Recent gastric emptying study showed gastroparesis.  Provider suspected this was contributing to reflux symptoms and intermittent nausea and vomiting.   Past Medical History:  Diagnosis Date   Asthma    Blood transfusion without reported diagnosis    Chronic migraine    Common migraine with intractable migraine 02/16/2019   Diabetes mellitus without complication (HCC)    Endometriosis    Fatty liver    GERD (gastroesophageal reflux disease)    Hyperlipidemia    Hypertension    Hypothyroidism    Incontinence    Irregular heartbeat    Multiple sclerosis (HCC)    Palpitations    Postconcussive syndrome 05/09/2020   Pre-diabetes    Sleep apnea     Past Surgical History:  Procedure Laterality Date   ABDOMINAL HYSTERECTOMY     CESAREAN SECTION     x1   CHOLECYSTECTOMY     COLONOSCOPY WITH PROPOFOL N/A 11/04/2020   Surgeon: Corbin Ade, MD; normal exam.  Repeat in 10 years.   ENDOMETRIAL ABLATION     ESOPHAGOGASTRODUODENOSCOPY (EGD) WITH PROPOFOL N/A 11/04/2020   Surgeon: Corbin Ade, MD; normal exam.   FOOT SURGERY Left 06/2018   foot reconstruction   REPLACEMENT TOTAL KNEE Left     Current Outpatient Medications  Medication Sig Dispense Refill   acetaminophen (TYLENOL) 500 MG tablet Take 500 mg by mouth as needed for moderate pain.     albuterol (VENTOLIN HFA) 108 (90 Base) MCG/ACT inhaler Inhale 3 puffs into the lungs every 6 (six) hours as needed for wheezing or shortness of breath. (Patient taking differently: Inhale 1-3 puffs into the lungs every 6 (six) hours as needed for wheezing or shortness of breath.) 3 g 1   aspirin EC 81 MG tablet Take 1 tablet (81 mg total) by mouth daily. Swallow whole.     busPIRone (BUSPAR) 7.5 MG tablet Take 7.5 mg by mouth 2 (two) times daily as needed.     calcium carbonate (TUMS - DOSED IN MG ELEMENTAL CALCIUM) 500 MG  chewable tablet Chew 2-3 tablets by mouth daily as needed for indigestion or heartburn.     cetirizine (ZYRTEC) 10 MG tablet Take 1 tablet (10 mg total) by mouth daily. 90 tablet 0   ciprofloxacin (CILOXAN) 0.3 % ophthalmic solution Place into both eyes daily.     clotrimazole-betamethasone (LOTRISONE) cream Apply 1 application topically as needed. (Patient taking differently: Apply 1 application topically as needed (Outbreak).) 45 g 2   Continuous Blood Gluc Sensor (FREESTYLE LIBRE 2 SENSOR) MISC 2 Devices by Does not apply route every 14 (fourteen) days. 2 each 3   Cyanocobalamin (B-12 COMPLIANCE INJECTION IJ) Inject 1 Dose as directed every 30 (thirty) days.     diclofenac (CATAFLAM) 50 MG tablet Take 1 tablet (50 mg total) by mouth 2 (two) times  daily. 90 tablet 3   Erenumab-aooe (AIMOVIG, 140 MG DOSE,) 70 MG/ML SOAJ Inject 140 mg into the skin every 30 (thirty) days. (Patient taking differently: Inject 140 mg into the skin every 28 (twenty-eight) days.) 1.12 mL 5   escitalopram (LEXAPRO) 20 MG tablet Take 1 tablet by mouth daily.     estradiol (EVAMIST) 1.53 MG/SPRAY transdermal spray Place 3 sprays onto the skin daily.     Evolocumab (REPATHA SURECLICK) 140 MG/ML SOAJ Inject 140 mg into the skin every 14 (fourteen) days.     ezetimibe (ZETIA) 10 MG tablet Take 1 tablet (10 mg total) by mouth daily. 90 tablet 3   glipiZIDE (GLUCOTROL) 10 MG tablet Take 10 mg by mouth daily.      hydrocortisone (ANUSOL-HC) 2.5 % rectal cream Place 1 application rectally 2 (two) times daily. 30 g 1   Hyoscyamine Sulfate SL (LEVSIN/SL) 0.125 MG SUBL Place 1 tablet (0.125 mg) under the tongue every 6 hours as needed for abdominal pain.  Hold in the setting of constipation. 120 tablet 0   ibuprofen (ADVIL) 200 MG tablet Take 600-800 mg by mouth as needed for mild pain or moderate pain.     insulin degludec (TRESIBA FLEXTOUCH) 100 UNIT/ML FlexTouch Pen Inject 20 units under the skin twice daily. (Patient taking  differently: Inject 20 Units into the skin 2 (two) times daily.) 15 mL 0   insulin lispro (HUMALOG KWIKPEN) 100 UNIT/ML KwikPen Inject 15 Units into the skin 3 (three) times daily. 15 mL 5   isosorbide mononitrate (IMDUR) 30 MG 24 hr tablet Take 0.5 tablets (15 mg total) by mouth daily. 15 tablet 6   levothyroxine (SYNTHROID) 100 MCG tablet Take 100 mcg by mouth See admin instructions. Take with the to equal 225 mcg, 6 days per week. Skip Sundays     lisinopril (ZESTRIL) 5 MG tablet Take 1 tablet (5 mg total) by mouth daily. 90 tablet 1   MAGNESIUM OXIDE PO Take 1 tablet by mouth daily.     MELATONIN PO Take 1 capsule by mouth at bedtime as needed (sleep).     metFORMIN (GLUCOPHAGE) 1000 MG tablet Take 1,000 mg by mouth 2 (two) times daily.     metoprolol tartrate (LOPRESSOR) 25 MG tablet Take 1 tablet (25 mg total) by mouth in the morning. 90 tablet 3   metoprolol tartrate (LOPRESSOR) 50 MG tablet Take 1 tablet (50 mg total) by mouth at bedtime. 90 tablet 3   metroNIDAZOLE (METROGEL) 1 % gel Apply 1 application topically at bedtime.      montelukast (SINGULAIR) 10 MG tablet Take 10 mg by mouth every morning.     Multiple Vitamins-Minerals (MULTIVITAMIN WITH MINERALS) tablet Take 1 tablet by mouth daily.     naproxen (NAPROSYN) 500 MG tablet Take 1 tablet (500 mg total) by mouth 2 (two) times daily with a meal. (Patient taking differently: Take 500 mg by mouth as needed.) 30 tablet 0   nitroGLYCERIN (NITROSTAT) 0.4 MG SL tablet Place 1 tablet (0.4 mg total) under the tongue every 5 (five) minutes as needed for chest pain. 25 tablet 3   nystatin cream (MYCOSTATIN) Apply 1 application topically daily as needed for dry skin.     Olopatadine HCl (PAZEO OP) Place 1 drop into both eyes daily.     omeprazole (PRILOSEC) 40 MG capsule Take 1 capsule (40 mg total) by mouth 2 (two) times daily before a meal. 60 capsule 3   ondansetron (ZOFRAN) 4 MG tablet Take 1  tablet (4 mg total) by mouth every 8  (eight) hours as needed for nausea or vomiting. (Patient taking differently: Take 4 mg by mouth as needed for nausea or vomiting.) 20 tablet 1   ondansetron (ZOFRAN) 8 MG tablet Take 1 tablet (8 mg total) by mouth every 8 (eight) hours as needed for nausea or vomiting. 20 tablet 1   Red Yeast Rice Extract (RED YEAST RICE PO) Take 1 tablet by mouth daily.      Riboflavin (VITAMIN B-2 PO) Take 1 tablet by mouth daily.      rizatriptan (MAXALT) 10 MG tablet Take 1 tablet (10 mg total) by mouth 3 (three) times daily as needed for migraine. (Patient taking differently: Take 10 mg by mouth as needed for migraine.) 10 tablet 5   Semaglutide, 1 MG/DOSE, (OZEMPIC, 1 MG/DOSE,) 2 MG/1.5ML SOPN Inject 1 mg into the skin once a week. (Patient taking differently: Inject 1 mg into the skin every Sunday.) 4.5 mL 1   SYNTHROID 125 MCG tablet Take two tablets (250mg  total) by mouth days 1-6 and skip day 7. (Patient taking differently: Take 125 mcg by mouth See admin instructions. Take with 100 mcg to equal 225 mcg, 6 days per week. Skip Sundays) 180 tablet 1   Tretinoin, Facial Wrinkles, (RENOVA) 0.02 % CREA Apply 1 application topically at bedtime.     triamcinolone cream (KENALOG) 0.1 % Apply 1 application topically 2 (two) times daily as needed. (Patient taking differently: Apply 1 application topically 2 (two) times daily as needed (rash).) 45 g 0   triamterene-hydrochlorothiazide (MAXZIDE-25) 37.5-25 MG tablet Take 1 tablet by mouth daily. 90 tablet 3   venlafaxine XR (EFFEXOR XR) 75 MG 24 hr capsule Take 1 capsule (75 mg total) by mouth daily with breakfast. 30 capsule 5   Vitamin D, Ergocalciferol, (DRISDOL) 1.25 MG (50000 UNIT) CAPS capsule Take 1 capsule (50,000 Units total) by mouth every 7 (seven) days. 12 capsule 0   zonisamide (ZONEGRAN) 100 MG capsule Take 1 capsule daily 30 capsule 5   No current facility-administered medications for this visit.   Allergies:  Crestor [rosuvastatin], Lipitor  [atorvastatin], Percocet [oxycodone-acetaminophen], and Zocor [simvastatin]   Social History: The patient  reports that she has never smoked. She has never used smokeless tobacco. She reports current alcohol use. She reports that she does not use drugs.   Family History: The patient's family history includes Cancer in her father, mother, and sister; Colon cancer in her father; Diabetes (age of onset: 42) in her son; Heart attack in her mother; Hyperlipidemia in her mother; Hypothyroidism in her mother and sister; Memory loss in her daughter; Other in her daughter and son; Stroke in her mother.   ROS:  Please see the history of present illness. Otherwise, complete review of systems is positive for none.  All other systems are reviewed and negative.   Physical Exam: VS:  There were no vitals taken for this visit., BMI There is no height or weight on file to calculate BMI.  Wt Readings from Last 3 Encounters:  03/25/21 225 lb (102.1 kg)  03/18/21 225 lb (102.1 kg)  03/17/21 224 lb 12.8 oz (102 kg)    General: Obese patient appears comfortable at rest. Neck: Supple, no elevated JVP or carotid bruits, no thyromegaly. Lungs: Clear to auscultation, nonlabored breathing at rest. Cardiac: Regular rate and rhythm, no S3 or significant systolic murmur, no pericardial rub. Extremities: No pitting edema, distal pulses 2+. Skin: Warm and dry. Musculoskeletal: No kyphosis. Neuropsychiatric:  Alert and oriented x3, affect grossly appropriate.  ECG:    Recent Labwork: 01/13/2021: ALT 30; AST 22; BUN 16; Hemoglobin 14.7; Platelets 293.0; Potassium 4.2; Sodium 136; TSH 9.47 03/18/2021: Creatinine, Ser 0.80     Component Value Date/Time   CHOL 277 (H) 01/13/2021 0958   CHOL 244 (H) 08/27/2020 1113   TRIG 196.0 (H) 01/13/2021 0958   HDL 59.40 01/13/2021 0958   HDL 48 08/27/2020 1113   CHOLHDL 5 01/13/2021 0958   VLDL 39.2 01/13/2021 0958   LDLCALC 178 (H) 01/13/2021 0958   LDLCALC 159 (H) 08/27/2020  1113    Other Studies Reviewed Today:   Eugenie Birks Myoview 03/06/2021 to Study Result  Narrative & Impression  No diagnostic ST segment changes to indicate ischemia. Very small, mild intensity, basal anteroseptal defect that is partially reversible and most consistent with variable soft tissue attenuation. No large ischemic territories. This is a low risk study. Nuclear stress EF: 68%.      05/2019 echo IMPRESSIONS   1. The left ventricle has hyperdynamic systolic function, with an ejection fraction of >65%. The cavity size was normal. Left ventricular diastolic parameters were normal. No evidence of left ventricular regional wall motion abnormalities.  2. The right ventricle has normal systolic function. The cavity was normal. There is no increase in right ventricular wall thickness.  3. The aortic valve is tricuspid. Mild aortic annular calcification noted.  4. The mitral valve is grossly normal.  5. The tricuspid valve is grossly normal.  6. The aorta is normal unless otherwise noted.     03/2019 event monitor 14 day event monitor Min HR 55, Max HR 161, Avg HR 80 Reported symptoms correlate with sinus rhythm No significant arrhythmias   Assessment and Plan:  1. Palpitations   2. Essential hypertension   3. Mixed hyperlipidemia   4. Chest pain, unspecified type      1. Palpitations Today she did not mention recent palpitations.  Continue metoprolol 25 mg a.m., metoprolol 50 mg p.m. recent TSH 9.47.  2. Essential hypertension Blood pressure reasonably well controlled on current medications today with BP of 124/88 continue lisinopril 5 mg daily, triamterene/hydrochlorothiazide 37.5/25 mg daily.  3. Mixed hyperlipidemia She was recently started on Repatha and has had 3 injections per her statement.  She is requesting we do a lipid profile to recheck her lipid levels.  She was seeing the lipid clinic but she requests a follow-up lipid panel to recheck her lipid levels..   Please get FLP and LFTs.  4.  Chest pain Recent stress test was considered low risk study per interpreting physician.  She was started on sublingual nitroglycerin at last visit and had taken a couple of doses with some minimal relief.  She continues with ongoing chest pain with and without exertional activity.  We discussed the results of the stress test.  She is still concerned this may be anginal related pain due to her ongoing chest pain in spite of taking the sublingual nitroglycerin and the low risk study.  We will add Imdur 15 mg daily.  Advised her if she had breakthrough pain to take a sublingual nitroglycerin and if symptoms persist we will speak with her primary cardiologist regarding possible cardiac catheterization.  I discussed thoroughly with her the risk and benefits of cardiac catheterization in the event she has to undergo this study.  She verbalizes understanding. She recently had both an EGD and colonoscopy which were both clear from GI standpoint per her statement.  I  advised patient if she continues to have chest pain at next visit I would consult with her primary cardiologist Dr. Wyline MoodBranch about possible cardiac catheterization if he feels this is warranted.  Medication Adjustments/Labs and Tests Ordered: Current medicines are reviewed at length with the patient today.  Concerns regarding medicines are outlined above.   Disposition: Follow-up with Dr. Wyline MoodBranch or APP 1 month  Signed, Rennis HardingAndrew Quinn, NP 04/21/2021 1:27 PM    [facility]  [address], [place], [place] [address] Phone: [phone]; Fax: [phone]

## 2021-04-22 ENCOUNTER — Ambulatory Visit: Payer: Medicaid Other | Admitting: Family Medicine

## 2021-04-28 ENCOUNTER — Encounter: Payer: Medicaid Other | Admitting: Obstetrics & Gynecology

## 2021-04-29 ENCOUNTER — Ambulatory Visit: Payer: Medicaid Other | Admitting: Orthopedic Surgery

## 2021-04-29 ENCOUNTER — Ambulatory Visit: Payer: Medicaid Other | Admitting: Endocrinology

## 2021-05-02 ENCOUNTER — Ambulatory Visit: Payer: No Typology Code available for payment source | Admitting: Dietician

## 2021-05-09 ENCOUNTER — Ambulatory Visit: Payer: Medicaid Other | Admitting: Physician Assistant

## 2021-05-19 ENCOUNTER — Encounter: Payer: Medicaid Other | Admitting: Adult Health

## 2021-05-19 ENCOUNTER — Other Ambulatory Visit: Payer: Self-pay

## 2021-06-09 NOTE — Progress Notes (Deleted)
NEUROLOGY FOLLOW UP OFFICE NOTE  Amy Lester [id number]  Assessment/Plan:   Migraine *** History of multiple sclerosis with abnormal brain MRI nonspecific but probably represents demyelinating disease.  However, ***  Migraine prevention:  *** Migraine rescue:  *** Limit use of pain relievers to no more than 2 days out of week to prevent risk of rebound or medication-overuse headache. Keep headache diary Follow up ***   Subjective:  Amy Lester is a 50 year old left-handed female with HTN, type 2 diabetes, HLD, hypothyroidism and chronic migraines who follows up for migraines and multiple sclerosis.  UPDATE: She underwent workup for multiple sclerosis. 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  MRI of cervical spine with and without contrast showed mild-moderate canal stenosis at C5-C6 and C6-C7 with posterior discs contacting and deforming ventral cord and mild to moderate bilateral foraminal stenosis at C6-C7 (stable compared to prior imaging from 2020) but no demyelinating lesions of spinal cord.  Lumbar puncture was ordered but ***.  Labs from May revealed low vit D of 22.71 and B12 298.  She was started on D supplementation.  ***  For management of migraines, she was started on zonisamide in April. *** Current NSAIDS/analgesics:  Ibuprofen, naproxen Current triptans:  Maxalt 10mg  Current ergotamine:  none Current anti-emetic:  Zofran Current muscle relaxants:  none Current Antihypertensive medications:  Lopressor, lisinopril Current Antidepressant medications:  Venlafaxine XR 75mg  QD (anxiety) Current Anticonvulsant medications:  zonisamide 100mg  daily Current anti-CGRP:  Aimovig 140mg  Q30d Current  Vitamins/Herbal/Supplements:  Magnesium, riboflavin,  melatonin, B12 Current Antihistamines/Decongestants:  Zyrtec Other therapy:  none Hormone/birth control:  Estradiol, levothyroxine   HISTORY: She reports that she was diagnosed with multiple sclerosis around 2020 where she participated in a study at .  She has no available medical records regarding specifics of her diagnosis and treatment.  She presented with generalized muscle weakness, hemisensory loss, needed a wheelchair.  She was diagnosed with MRI and LP.  She was on several medications but does not know what they were.  She moved to [facility] in 2020 and established care with neurologist, Dr. 2021.  MRI of brain with and without contrast on 03/16/2019 showed scattered T2/FLAIR hyperintense foci in the bilateral cerebral hemispheres which appeared nonspecific.  MRI of cervical spine with and without contrast same day showed some disc protrusions but no abnormality of the cervical spinal cord.  Based on these nonspecific findings, and because patient declined lumbar puncture, definite diagnosis of multiple sclerosis could not be established.  She is ambulatory but feels weak in extremities.  She has fallen twice.  She sometimes has trouble with fine-finger movements. Symptoms fluctuate, on for two to three months and improves.  When symptoms improve, never at previous baseline.  It occurs more than once a year.  Left foot reconstruction -    She has had migraines since the mid-1990s.  They are severe frontal/vertex pounding/shooting headache.  Associated nausea, photophobia, phonophobia and sometimes vomits.  They usually last all day and occur 4-5 days a week (they were daily prior to Aimovig).  Past NSAIDS/analgesics:  Ketorolac 10mg  Past abortive triptans:  Frova, sumatriptan Past abortive ergotamine:  none Past muscle relaxants:  none Past anti-emetic:  none Past antihypertensive medications:  HCTZ Past antidepressant  medications:  amitriptyline Past anticonvulsant  medications:  Topiramate (upset stomach), gabapentin Past anti-CGRP:  none Past vitamins/Herbal/Supplements:  none Past antihistamines/decongestants:  none Other past therapies:  None.  Does not want to try Botox.    PAST MEDICAL HISTORY: Past Medical History:  Diagnosis Date   Asthma    Blood transfusion without reported diagnosis    Chronic migraine    Common migraine with intractable migraine 02/16/2019   Diabetes mellitus without complication (HCC)    Endometriosis    Fatty liver    GERD (gastroesophageal reflux disease)    Hyperlipidemia    Hypertension    Hypothyroidism    Incontinence    Irregular heartbeat    Multiple sclerosis (HCC)    Palpitations    Postconcussive syndrome 05/09/2020   Pre-diabetes    Sleep apnea     MEDICATIONS: Current Outpatient Medications on File Prior to Visit  Medication Sig Dispense Refill   acetaminophen (TYLENOL) 500 MG tablet Take 500 mg by mouth as needed for moderate pain.     albuterol (VENTOLIN HFA) 108 (90 Base) MCG/ACT inhaler Inhale 3 puffs into the lungs every 6 (six) hours as needed for wheezing or shortness of breath. (Patient taking differently: Inhale 1-3 puffs into the lungs every 6 (six) hours as needed for wheezing or shortness of breath.) 3 g 1   aspirin EC 81 MG tablet Take 1 tablet (81 mg total) by mouth daily. Swallow whole.     busPIRone (BUSPAR) 7.5 MG tablet Take 7.5 mg by mouth 2 (two) times daily as needed.     calcium carbonate (TUMS - DOSED IN MG ELEMENTAL CALCIUM) 500 MG chewable tablet Chew 2-3 tablets by mouth daily as needed for indigestion or heartburn.     cetirizine (ZYRTEC) 10 MG tablet Take 1 tablet (10 mg total) by mouth daily. 90 tablet 0   ciprofloxacin (CILOXAN) 0.3 % ophthalmic solution Place into both eyes daily.     clotrimazole-betamethasone (LOTRISONE) cream Apply 1 application topically as needed. (Patient taking differently: Apply 1 application  topically as needed (Outbreak).) 45 g 2   Continuous Blood Gluc Sensor (FREESTYLE LIBRE 2 SENSOR) MISC 2 Devices by Does not apply route every 14 (fourteen) days. 2 each 3   Cyanocobalamin (B-12 COMPLIANCE INJECTION IJ) Inject 1 Dose as directed every 30 (thirty) days.     diclofenac (CATAFLAM) 50 MG tablet Take 1 tablet (50 mg total) by mouth 2 (two) times daily. 90 tablet 3   Erenumab-aooe (AIMOVIG, 140 MG DOSE,) 70 MG/ML SOAJ Inject 140 mg into the skin every 30 (thirty) days. (Patient taking differently: Inject 140 mg into the skin every 28 (twenty-eight) days.) 1.12 mL 5   escitalopram (LEXAPRO) 20 MG tablet Take 1 tablet by mouth daily.     estradiol (EVAMIST) 1.53 MG/SPRAY transdermal spray Place 3 sprays onto the skin daily.     Evolocumab (REPATHA SURECLICK) 140 MG/ML SOAJ Inject 140 mg into the skin every 14 (fourteen) days.     ezetimibe (ZETIA) 10 MG tablet Take 1 tablet (10 mg total) by mouth daily. 90 tablet 3   glipiZIDE (GLUCOTROL) 10 MG tablet Take 10 mg by mouth daily.      hydrocortisone (ANUSOL-HC) 2.5 % rectal cream Place 1 application rectally 2 (two) times daily. 30 g 1   Hyoscyamine Sulfate SL (LEVSIN/SL) 0.125 MG SUBL Place 1 tablet (0.125 mg) under the tongue every 6 hours as needed for abdominal pain.  Hold in the setting of constipation. 120 tablet 0   ibuprofen (ADVIL) 200 MG  tablet Take 600-800 mg by mouth as needed for mild pain or moderate pain.     insulin degludec (TRESIBA FLEXTOUCH) 100 UNIT/ML FlexTouch Pen Inject 20 units under the skin twice daily. (Patient taking differently: Inject 20 Units into the skin 2 (two) times daily.) 15 mL 0   insulin lispro (HUMALOG KWIKPEN) 100 UNIT/ML KwikPen Inject 15 Units into the skin 3 (three) times daily. 15 mL 5   isosorbide mononitrate (IMDUR) 30 MG 24 hr tablet Take 0.5 tablets (15 mg total) by mouth daily. 15 tablet 6   levothyroxine (SYNTHROID) 100 MCG tablet Take 100 mcg by mouth See admin instructions. Take with the  to equal 225 mcg, 6 days per week. Skip Sundays     lisinopril (ZESTRIL) 5 MG tablet Take 1 tablet (5 mg total) by mouth daily. 90 tablet 1   MAGNESIUM OXIDE PO Take 1 tablet by mouth daily.     MELATONIN PO Take 1 capsule by mouth at bedtime as needed (sleep).     metFORMIN (GLUCOPHAGE) 1000 MG tablet Take 1,000 mg by mouth 2 (two) times daily.     metoprolol tartrate (LOPRESSOR) 25 MG tablet Take 1 tablet (25 mg total) by mouth in the morning. 90 tablet 3   metoprolol tartrate (LOPRESSOR) 50 MG tablet Take 1 tablet (50 mg total) by mouth at bedtime. 90 tablet 3   metroNIDAZOLE (METROGEL) 1 % gel Apply 1 application topically at bedtime.      montelukast (SINGULAIR) 10 MG tablet Take 10 mg by mouth every morning.     Multiple Vitamins-Minerals (MULTIVITAMIN WITH MINERALS) tablet Take 1 tablet by mouth daily.     naproxen (NAPROSYN) 500 MG tablet Take 1 tablet (500 mg total) by mouth 2 (two) times daily with a meal. (Patient taking differently: Take 500 mg by mouth as needed.) 30 tablet 0   nitroGLYCERIN (NITROSTAT) 0.4 MG SL tablet Place 1 tablet (0.4 mg total) under the tongue every 5 (five) minutes as needed for chest pain. 25 tablet 3   nystatin cream (MYCOSTATIN) Apply 1 application topically daily as needed for dry skin.     Olopatadine HCl (PAZEO OP) Place 1 drop into both eyes daily.     omeprazole (PRILOSEC) 40 MG capsule Take 1 capsule (40 mg total) by mouth 2 (two) times daily before a meal. 60 capsule 3   ondansetron (ZOFRAN) 4 MG tablet Take 1 tablet (4 mg total) by mouth every 8 (eight) hours as needed for nausea or vomiting. (Patient taking differently: Take 4 mg by mouth as needed for nausea or vomiting.) 20 tablet 1   ondansetron (ZOFRAN) 8 MG tablet Take 1 tablet (8 mg total) by mouth every 8 (eight) hours as needed for nausea or vomiting. 20 tablet 1   Red Yeast Rice Extract (RED YEAST RICE PO) Take 1 tablet by mouth daily.      Riboflavin (VITAMIN B-2 PO) Take 1 tablet by  mouth daily.      rizatriptan (MAXALT) 10 MG tablet Take 1 tablet (10 mg total) by mouth 3 (three) times daily as needed for migraine. (Patient taking differently: Take 10 mg by mouth as needed for migraine.) 10 tablet 5   Semaglutide, 1 MG/DOSE, (OZEMPIC, 1 MG/DOSE,) 2 MG/1.5ML SOPN Inject 1 mg into the skin once a week. (Patient taking differently: Inject 1 mg into the skin every Sunday.) 4.5 mL 1   SYNTHROID 125 MCG tablet Take two tablets (250mg  total) by mouth days 1-6 and skip day 7. (  Patient taking differently: Take 125 mcg by mouth See admin instructions. Take with 100 mcg to equal 225 mcg, 6 days per week. Skip Sundays) 180 tablet 1   Tretinoin, Facial Wrinkles, (RENOVA) 0.02 % CREA Apply 1 application topically at bedtime.     triamcinolone cream (KENALOG) 0.1 % Apply 1 application topically 2 (two) times daily as needed. (Patient taking differently: Apply 1 application topically 2 (two) times daily as needed (rash).) 45 g 0   triamterene-hydrochlorothiazide (MAXZIDE-25) 37.5-25 MG tablet Take 1 tablet by mouth daily. 90 tablet 3   venlafaxine XR (EFFEXOR XR) 75 MG 24 hr capsule Take 1 capsule (75 mg total) by mouth daily with breakfast. 30 capsule 5   Vitamin D, Ergocalciferol, (DRISDOL) 1.25 MG (50000 UNIT) CAPS capsule Take 1 capsule (50,000 Units total) by mouth every 7 (seven) days. 12 capsule 0   zonisamide (ZONEGRAN) 100 MG capsule Take 1 capsule daily 30 capsule 5   No current facility-administered medications on file prior to visit.    ALLERGIES: Allergies  Allergen Reactions   Crestor [Rosuvastatin]     myalgia   Lipitor [Atorvastatin]     myalgia   Percocet [Oxycodone-Acetaminophen] Nausea And Vomiting   Zocor [Simvastatin]     myalgia    FAMILY HISTORY: Family History  Problem Relation Age of Onset   Heart attack Mother    Stroke Mother    Cancer Mother        breast cancer   Hyperlipidemia Mother    Hypothyroidism Mother    Cancer Father        lymph nodes,  liver cancer   Colon cancer Father        early 7s   Other Daughter        Fine Gold Type II Syndrome and cognetive issues   Memory loss Daughter    Other Son        Fine Gold Syndrome and BPES   Diabetes Son 22       IDDM   Hypothyroidism Sister    Cancer Sister    Hyperthyroidism Neg Hx       Objective:  *** General: No acute distress.  Patient appears ***-groomed.   Head:  Normocephalic/atraumatic Eyes:  Fundi examined but not visualized Neck: supple, no paraspinal tenderness, full range of motion Heart:  Regular rate and rhythm Lungs:  Clear to auscultation bilaterally Back: No paraspinal tenderness Neurological Exam: alert and oriented to person, place, and time.  Speech fluent and not dysarthric, language intact.  CN II-XII intact. Bulk and tone normal, muscle strength 5/5 throughout.  Sensation to light touch intact.  Deep tendon reflexes 2+ throughout, toes downgoing.  Finger to nose testing intact.  Gait normal, Romberg negative.   Shon Millet, DO  CC: ***

## 2021-06-10 ENCOUNTER — Ambulatory Visit
Admission: EM | Admit: 2021-06-10 | Discharge: 2021-06-10 | Disposition: A | Discharge status: Home / Self Care | Payer: Medicaid Other | Attending: Emergency Medicine | Admitting: Emergency Medicine

## 2021-06-10 ENCOUNTER — Other Ambulatory Visit: Payer: Self-pay

## 2021-06-10 ENCOUNTER — Ambulatory Visit (INDEPENDENT_AMBULATORY_CARE_PROVIDER_SITE_OTHER): Payer: Self-pay

## 2021-06-10 ENCOUNTER — Ambulatory Visit: Payer: Self-pay | Admitting: Neurology

## 2021-06-10 DIAGNOSIS — R0602 Shortness of breath: Secondary | ICD-10-CM

## 2021-06-10 DIAGNOSIS — R059 Cough, unspecified: Secondary | ICD-10-CM

## 2021-06-10 DIAGNOSIS — J4541 Moderate persistent asthma with (acute) exacerbation: Secondary | ICD-10-CM

## 2021-06-10 DIAGNOSIS — R9431 Abnormal electrocardiogram [ECG] [EKG]: Secondary | ICD-10-CM

## 2021-06-10 DIAGNOSIS — R079 Chest pain, unspecified: Secondary | ICD-10-CM

## 2021-06-10 IMAGING — DX DG CHEST 2V
2 series · 2 of 2 positions shown · non-contrast
Comparison: Chest x-ray dated [DATE].

CLINICAL DATA: Cough, shortness of breath, and chest pain.

EXAM:
CHEST - 2 VIEW

[chest pa]
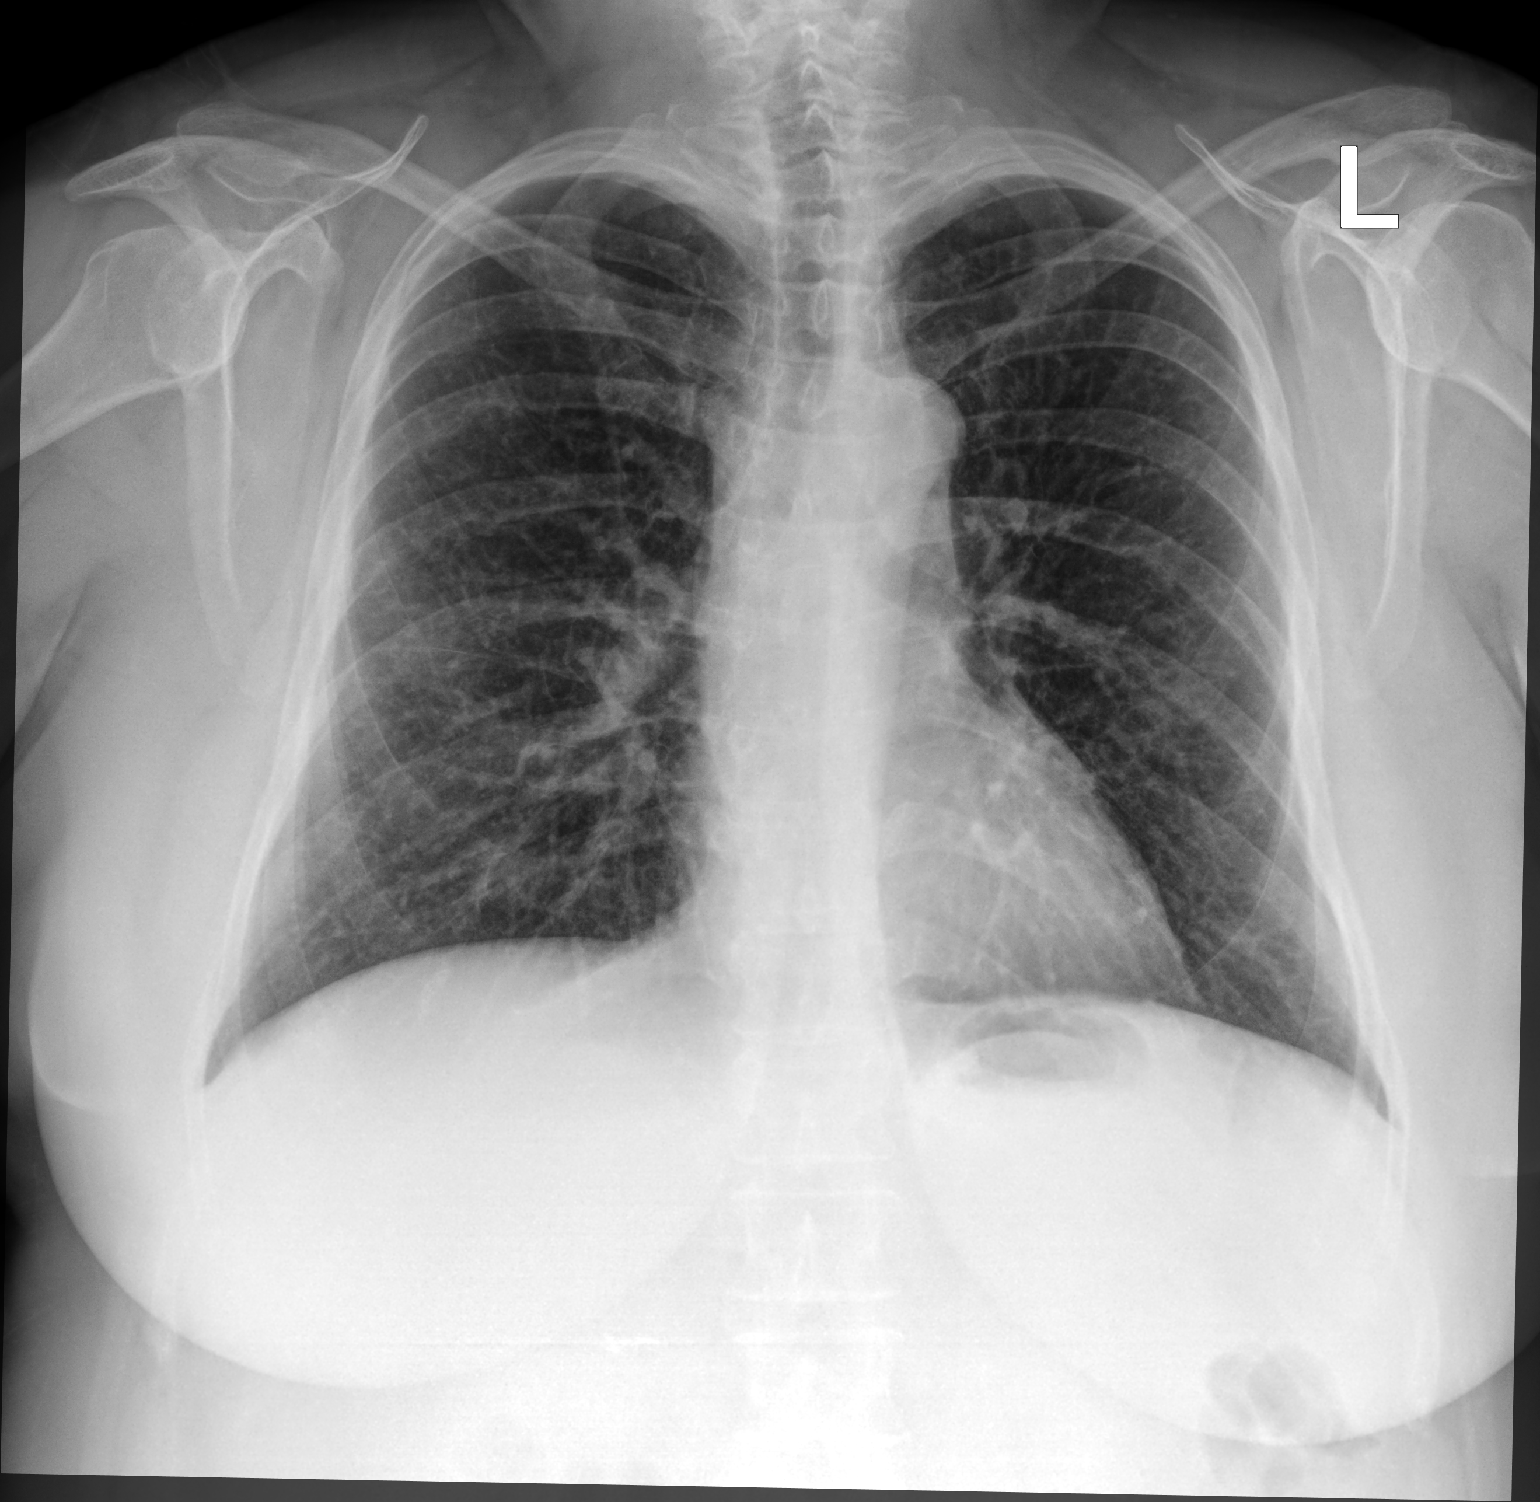

[chest lat]
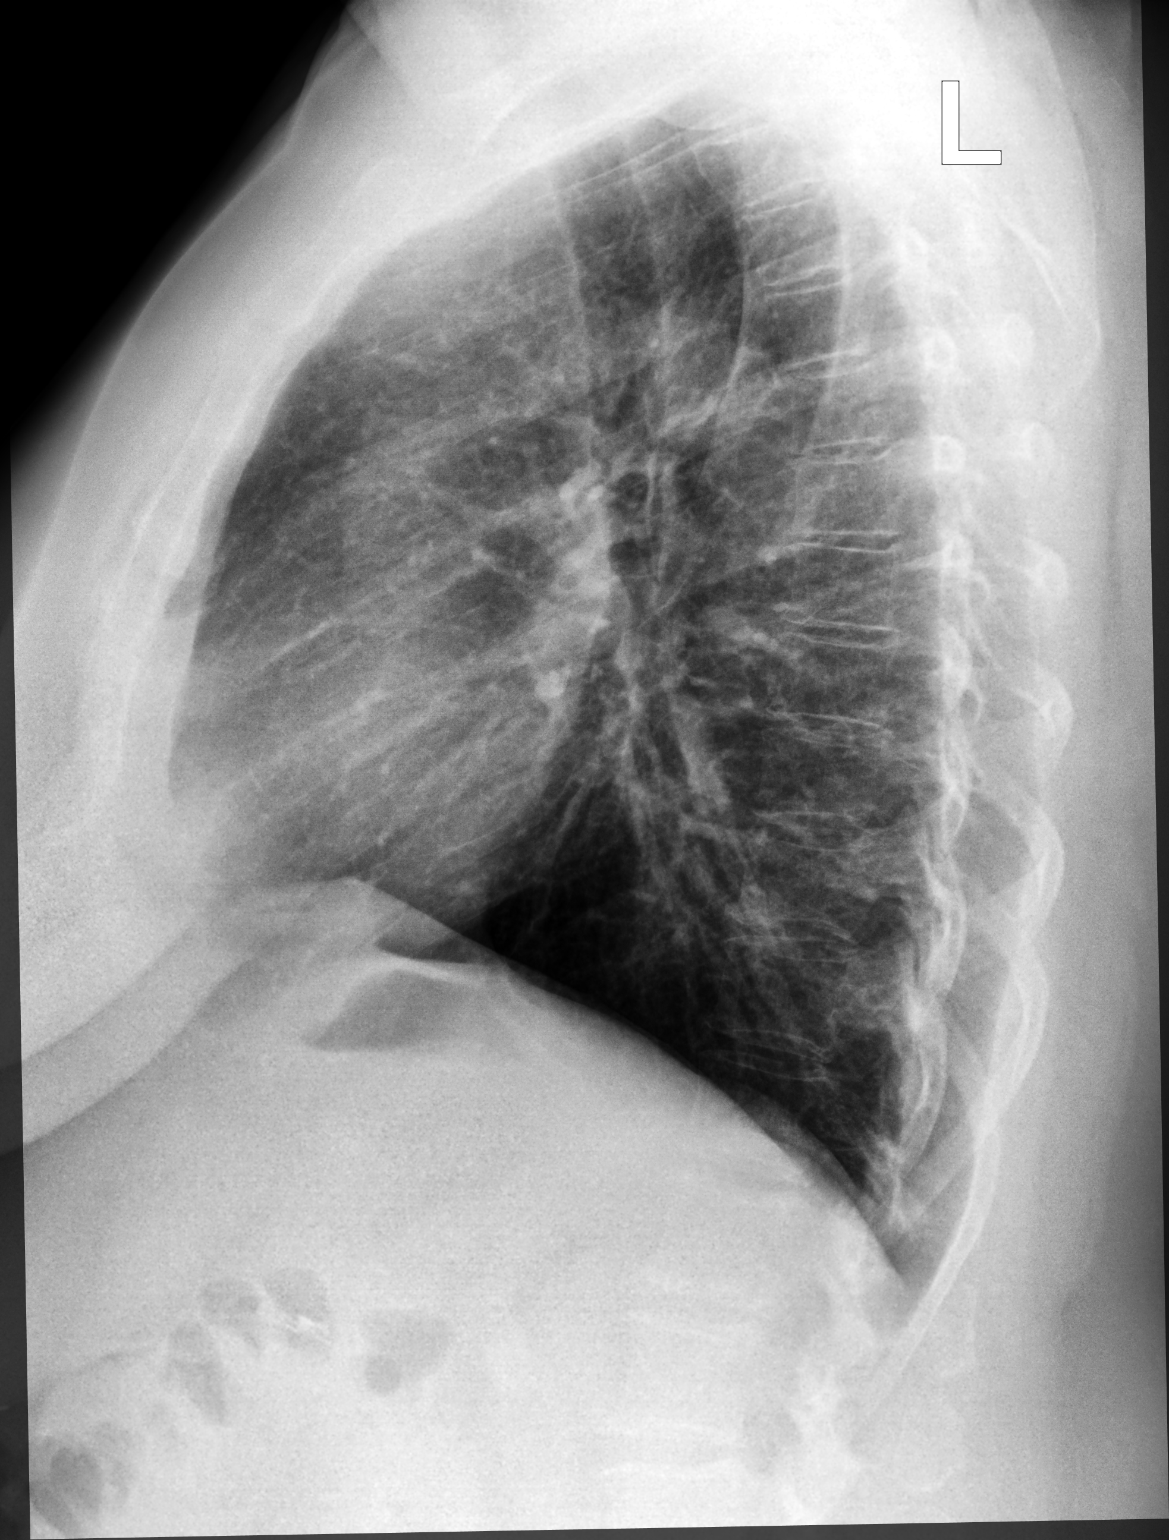

[2 of 2 positions shown; findings below may reference images not displayed]

FINDINGS: The heart size and mediastinal contours are within normal limits.
Both lungs are clear. The visualized skeletal structures are
unremarkable.
IMPRESSION: No active cardiopulmonary disease.

## 2021-06-10 MED ORDER — PREDNISONE 10 MG (21) PO TBPK: ORAL_TABLET | ORAL | 0 refills | Status: DC

## 2021-06-10 MED ORDER — ALBUTEROL SULFATE HFA 108 (90 BASE) MCG/ACT IN AERS
6.0000 | INHALATION_SPRAY | Freq: Once | RESPIRATORY_TRACT | Status: AC
  Administered 2021-06-10: 6 via RESPIRATORY_TRACT

## 2021-06-10 MED ORDER — AEROCHAMBER PLUS FLO-VU MEDIUM MISC
1.0000 | Freq: Once | Status: AC
  Administered 2021-06-10: 1

## 2021-06-10 NOTE — ED Triage Notes (Signed)
Patient presents to Urgent Care with complaints of wheezing, SOB, and chest discomfort when taking a breath x  Treating symptoms with albuterol with some relief. Pt states she believes weather has triggered her asthma flare-up. Has a hx of asthma.   Denies fever.

## 2021-06-10 NOTE — ED Provider Notes (Signed)
HPI  SUBJECTIVE:  Amy Lester is a 50 y.o. female who presents with 4 to 5 days of constant chest tightness/heaviness, "sizzling and rattling" with breathing, intermittent shortness of breath, slight cough, decreased exercise tolerance.  No nasal congestion, rhinorrhea.  No known asthma triggers.  She states that her symptoms are worse with lying down and going outside.  She has been using her albuterol with a spacer 4 times a day, normally uses it as needed, Sudafed, Tylenol.  The albuterol, Tylenol help.  No itchy, watery eyes, no GERD symptoms.  No orthopnea, nocturia, lower extremity edema, unintentional weight gain, abdominal pain, chest pressure, radiation of this pain up her neck, down her arm, through to her back, nausea, diaphoresis, exertional component.  No calf pain, swelling, hemoptysis, exogenous estrogen, recent immobilization or surgery in the past 4 weeks.  She has had symptoms like this before, was found to have an asthma exacerbation and was treated with antibiotics.  She has a past medical history of asthma for which she has been admitted in the past.  No recent steroid use or intubations.  She has has a history of GERD, allergies in the fall/sprain, hypothyroidism, hypertension, hypercholesterolemia, diabetes.  No history of CHF, MI, coronary disease, PE, DVT, cancer, smoking.  LNL:[id number], Bettey Mare, MD   Past Medical History:  Diagnosis Date   Asthma    Blood transfusion without reported diagnosis    Chronic migraine    Common migraine with intractable migraine 02/16/2019   Diabetes mellitus without complication (HCC)    Endometriosis    Fatty liver    GERD (gastroesophageal reflux disease)    Hyperlipidemia    Hypertension    Hypothyroidism    Incontinence    Irregular heartbeat    Multiple sclerosis (HCC)    Palpitations    Postconcussive syndrome 05/09/2020   Pre-diabetes    Sleep apnea     Past Surgical History:  Procedure Laterality Date   ABDOMINAL  HYSTERECTOMY     CESAREAN SECTION     x1   CHOLECYSTECTOMY     COLONOSCOPY WITH PROPOFOL N/A 11/04/2020   Surgeon: Corbin Ade, MD; normal exam.  Repeat in 10 years.   ENDOMETRIAL ABLATION     ESOPHAGOGASTRODUODENOSCOPY (EGD) WITH PROPOFOL N/A 11/04/2020   Surgeon: Corbin Ade, MD; normal exam.   FOOT SURGERY Left 06/2018   foot reconstruction   REPLACEMENT TOTAL KNEE Left     Family History  Problem Relation Age of Onset   Heart attack Mother    Stroke Mother    Cancer Mother        breast cancer   Hyperlipidemia Mother    Hypothyroidism Mother    Cancer Father        lymph nodes, liver cancer   Colon cancer Father        early 59s   Other Daughter        Fine Gold Type II Syndrome and cognetive issues   Memory loss Daughter    Other Son        Fine Gold Syndrome and BPES   Diabetes Son 22       IDDM   Hypothyroidism Sister    Cancer Sister    Hyperthyroidism Neg Hx     Social History   Tobacco Use   Smoking status: Never   Smokeless tobacco: Never  Vaping Use   Vaping Use: Never used  Substance Use Topics   Alcohol use: Yes    Comment:  once a year   Drug use: Never    No current facility-administered medications for this encounter.  Current Outpatient Medications:    predniSONE (STERAPRED UNI-PAK 21 TAB) 10 MG (21) TBPK tablet, Dispense one 6 day pack. Take as directed with food., Disp: 21 tablet, Rfl: 0   acetaminophen (TYLENOL) 500 MG tablet, Take 500 mg by mouth as needed for moderate pain., Disp: , Rfl:    aspirin EC 81 MG tablet, Take 1 tablet (81 mg total) by mouth daily. Swallow whole., Disp: , Rfl:    busPIRone (BUSPAR) 7.5 MG tablet, Take 7.5 mg by mouth 2 (two) times daily as needed., Disp: , Rfl:    calcium carbonate (TUMS - DOSED IN MG ELEMENTAL CALCIUM) 500 MG chewable tablet, Chew 2-3 tablets by mouth daily as needed for indigestion or heartburn., Disp: , Rfl:    cetirizine (ZYRTEC) 10 MG tablet, Take 1 tablet (10 mg total) by mouth  daily., Disp: 90 tablet, Rfl: 0   ciprofloxacin (CILOXAN) 0.3 % ophthalmic solution, Place into both eyes daily., Disp: , Rfl:    clotrimazole-betamethasone (LOTRISONE) cream, Apply 1 application topically as needed. (Patient taking differently: Apply 1 application topically as needed (Outbreak).), Disp: 45 g, Rfl: 2   Continuous Blood Gluc Sensor (FREESTYLE LIBRE 2 SENSOR) MISC, 2 Devices by Does not apply route every 14 (fourteen) days., Disp: 2 each, Rfl: 3   Cyanocobalamin (B-12 COMPLIANCE INJECTION IJ), Inject 1 Dose as directed every 30 (thirty) days., Disp: , Rfl:    diclofenac (CATAFLAM) 50 MG tablet, Take 1 tablet (50 mg total) by mouth 2 (two) times daily., Disp: 90 tablet, Rfl: 3   Erenumab-aooe (AIMOVIG, 140 MG DOSE,) 70 MG/ML SOAJ, Inject 140 mg into the skin every 30 (thirty) days. (Patient taking differently: Inject 140 mg into the skin every 28 (twenty-eight) days.), Disp: 1.12 mL, Rfl: 5   escitalopram (LEXAPRO) 20 MG tablet, Take 1 tablet by mouth daily., Disp: , Rfl:    estradiol (EVAMIST) 1.53 MG/SPRAY transdermal spray, Place 3 sprays onto the skin daily., Disp: , Rfl:    Evolocumab (REPATHA SURECLICK) 140 MG/ML SOAJ, Inject 140 mg into the skin every 14 (fourteen) days., Disp: , Rfl:    ezetimibe (ZETIA) 10 MG tablet, Take 1 tablet (10 mg total) by mouth daily., Disp: 90 tablet, Rfl: 3   glipiZIDE (GLUCOTROL) 10 MG tablet, Take 10 mg by mouth daily. , Disp: , Rfl:    hydrocortisone (ANUSOL-HC) 2.5 % rectal cream, Place 1 application rectally 2 (two) times daily., Disp: 30 g, Rfl: 1   Hyoscyamine Sulfate SL (LEVSIN/SL) 0.125 MG SUBL, Place 1 tablet (0.125 mg) under the tongue every 6 hours as needed for abdominal pain.  Hold in the setting of constipation., Disp: 120 tablet, Rfl: 0   ibuprofen (ADVIL) 200 MG tablet, Take 600-800 mg by mouth as needed for mild pain or moderate pain., Disp: , Rfl:    insulin degludec (TRESIBA FLEXTOUCH) 100 UNIT/ML FlexTouch Pen, Inject 20 units  under the skin twice daily. (Patient taking differently: Inject 20 Units into the skin 2 (two) times daily.), Disp: 15 mL, Rfl: 0   insulin lispro (HUMALOG KWIKPEN) 100 UNIT/ML KwikPen, Inject 15 Units into the skin 3 (three) times daily., Disp: 15 mL, Rfl: 5   isosorbide mononitrate (IMDUR) 30 MG 24 hr tablet, Take 0.5 tablets (15 mg total) by mouth daily., Disp: 15 tablet, Rfl: 6   levothyroxine (SYNTHROID) 100 MCG tablet, Take 100 mcg by mouth See admin instructions. Take  with the to equal 225 mcg, 6 days per week. Skip Sundays, Disp: , Rfl:    lisinopril (ZESTRIL) 5 MG tablet, Take 1 tablet (5 mg total) by mouth daily., Disp: 90 tablet, Rfl: 1   MAGNESIUM OXIDE PO, Take 1 tablet by mouth daily., Disp: , Rfl:    MELATONIN PO, Take 1 capsule by mouth at bedtime as needed (sleep)., Disp: , Rfl:    metFORMIN (GLUCOPHAGE) 1000 MG tablet, Take 1,000 mg by mouth 2 (two) times daily., Disp: , Rfl:    metoprolol tartrate (LOPRESSOR) 25 MG tablet, Take 1 tablet (25 mg total) by mouth in the morning., Disp: 90 tablet, Rfl: 3   metoprolol tartrate (LOPRESSOR) 50 MG tablet, Take 1 tablet (50 mg total) by mouth at bedtime., Disp: 90 tablet, Rfl: 3   metroNIDAZOLE (METROGEL) 1 % gel, Apply 1 application topically at bedtime. , Disp: , Rfl:    montelukast (SINGULAIR) 10 MG tablet, Take 10 mg by mouth every morning., Disp: , Rfl:    Multiple Vitamins-Minerals (MULTIVITAMIN WITH MINERALS) tablet, Take 1 tablet by mouth daily., Disp: , Rfl:    naproxen (NAPROSYN) 500 MG tablet, Take 1 tablet (500 mg total) by mouth 2 (two) times daily with a meal. (Patient taking differently: Take 500 mg by mouth as needed.), Disp: 30 tablet, Rfl: 0   nitroGLYCERIN (NITROSTAT) 0.4 MG SL tablet, Place 1 tablet (0.4 mg total) under the tongue every 5 (five) minutes as needed for chest pain., Disp: 25 tablet, Rfl: 3   nystatin cream (MYCOSTATIN), Apply 1 application topically daily as needed for dry skin., Disp: , Rfl:     Olopatadine HCl (PAZEO OP), Place 1 drop into both eyes daily., Disp: , Rfl:    omeprazole (PRILOSEC) 40 MG capsule, Take 1 capsule (40 mg total) by mouth 2 (two) times daily before a meal., Disp: 60 capsule, Rfl: 3   ondansetron (ZOFRAN) 4 MG tablet, Take 1 tablet (4 mg total) by mouth every 8 (eight) hours as needed for nausea or vomiting. (Patient taking differently: Take 4 mg by mouth as needed for nausea or vomiting.), Disp: 20 tablet, Rfl: 1   ondansetron (ZOFRAN) 8 MG tablet, Take 1 tablet (8 mg total) by mouth every 8 (eight) hours as needed for nausea or vomiting., Disp: 20 tablet, Rfl: 1   Red Yeast Rice Extract (RED YEAST RICE PO), Take 1 tablet by mouth daily. , Disp: , Rfl:    Riboflavin (VITAMIN B-2 PO), Take 1 tablet by mouth daily. , Disp: , Rfl:    rizatriptan (MAXALT) 10 MG tablet, Take 1 tablet (10 mg total) by mouth 3 (three) times daily as needed for migraine. (Patient taking differently: Take 10 mg by mouth as needed for migraine.), Disp: 10 tablet, Rfl: 5   Semaglutide, 1 MG/DOSE, (OZEMPIC, 1 MG/DOSE,) 2 MG/1.5ML SOPN, Inject 1 mg into the skin once a week. (Patient taking differently: Inject 1 mg into the skin every Sunday.), Disp: 4.5 mL, Rfl: 1   SYNTHROID 125 MCG tablet, Take two tablets (250mg  total) by mouth days 1-6 and skip day 7. (Patient taking differently: Take 125 mcg by mouth See admin instructions. Take with 100 mcg to equal 225 mcg, 6 days per week. Skip Sundays), Disp: 180 tablet, Rfl: 1   Tretinoin, Facial Wrinkles, (RENOVA) 0.02 % CREA, Apply 1 application topically at bedtime., Disp: , Rfl:    triamcinolone cream (KENALOG) 0.1 %, Apply 1 application topically 2 (two) times daily as needed. (Patient taking differently:  Apply 1 application topically 2 (two) times daily as needed (rash).), Disp: 45 g, Rfl: 0   triamterene-hydrochlorothiazide (MAXZIDE-25) 37.5-25 MG tablet, Take 1 tablet by mouth daily., Disp: 90 tablet, Rfl: 3   venlafaxine XR (EFFEXOR XR) 75 MG 24  hr capsule, Take 1 capsule (75 mg total) by mouth daily with breakfast., Disp: 30 capsule, Rfl: 5   Vitamin D, Ergocalciferol, (DRISDOL) 1.25 MG (50000 UNIT) CAPS capsule, Take 1 capsule (50,000 Units total) by mouth every 7 (seven) days., Disp: 12 capsule, Rfl: 0   zonisamide (ZONEGRAN) 100 MG capsule, Take 1 capsule daily, Disp: 30 capsule, Rfl: 5  Allergies  Allergen Reactions   Crestor [Rosuvastatin]     myalgia   Lipitor [Atorvastatin]     myalgia   Percocet [Oxycodone-Acetaminophen] Nausea And Vomiting   Zocor [Simvastatin]     myalgia     ROS  As noted in HPI.   Physical Exam  BP 130/87 (BP Location: Left Arm)   Pulse 79   Temp 98 F (36.7 C) (Oral)   Resp 16   SpO2 96%   Constitutional: Well developed, well nourished, no acute distress Eyes:  EOMI, conjunctiva normal bilaterally HENT: Normocephalic, atraumatic,mucus membranes moist Respiratory: Normal inspiratory effort. Diffuse expiratory wheezing, rhonchi at the bases.  Positive anterior and lateral chest wall tenderness. Cardiovascular: Normal rate, regular rhythm, no murmurs rubs or gallop GI: nondistended skin: No rash, skin intact Musculoskeletal: Calves symmetric, nontender, no edema. Neurologic: Alert & oriented x 3, no focal neuro deficits Psychiatric: Speech and behavior appropriate   ED Course   Medications  albuterol (VENTOLIN HFA) 108 (90 Base) MCG/ACT inhaler 6 puff (6 puffs Inhalation Given 06/10/21 1330)  AeroChamber Plus Flo-Vu Medium MISC 1 each (1 each Other Given 06/10/21 1330)    Orders Placed This Encounter  Procedures   DG Chest 2 View    Standing Status:   Standing    Number of Occurrences:   1    Order Specific Question:   Reason for Exam (SYMPTOM  OR DIAGNOSIS REQUIRED)    Answer:   SOB cough r/o PNA, effusion, edema   ED EKG    Standing Status:   Standing    Number of Occurrences:   1    Order Specific Question:   Reason for Exam    Answer:   Shortness of breath    No  results found for this or any previous visit (from the past 24 hour(s)). DG Chest 2 View  Result Date: 06/10/2021 CLINICAL DATA:  Cough, shortness of breath, and chest pain. EXAM: CHEST - 2 VIEW COMPARISON:  Chest x-ray dated March 24, 2019. FINDINGS: The heart size and mediastinal contours are within normal limits. Both lungs are clear. The visualized skeletal structures are unremarkable. IMPRESSION: No active cardiopulmonary disease. Electronically Signed   By: Obie Dredge M.D.   On: 06/10/2021 13:34    ED Clinical Impression  1. Moderate persistent asthma with acute exacerbation   2. Prolonged Q-T interval on ECG      ED Assessment/Plan  Suspect asthma exacerbation.  Doubt PE, ACS, CHF.  However will check EKG, chest x-ray due to the rhonchi at the bases to rule out pneumonia. will give 6-8 puffs from albuterol inhaler with a spacer and reevaluate.  EKG: Normal sinus rhythm, rate 81.  Normal axis, slightly prolonged QT C at 483, no hypertrophy.  No ST-T wave changes.  No change from EKG on 8/21.  Reviewed imaging independently.  Normal chest x-ray.  See radiology report for full details.  No evidence of ischemia on EKG, prolonged QT noted which is new, she will need to follow-up with her doctor or cardiologist regarding this.  Discussed this with patient.  Chest x-ray is reassuring.  On reevaluation, patient states she feels better.  Wheezing has resolved.  She still has some rhonchi at the bases.  Will send home with 6-day prednisone taper, albuterol q 4-6 hours.  Follow-up with PMD as needed.  ER return cautions given.  Patient originally given instructions for regularly scheduled albuterol for the next 4 days, attempted to reach patient and tell her to use it as needed rather than scheduled due to the prolonged QTC.  Left voicemail.  Discussed EKG, imaging, MDM, treatment plan, and plan for follow-up with patient. Discussed sn/sx that should prompt return to the ED. patient agrees  with plan.   Meds ordered this encounter  Medications   albuterol (VENTOLIN HFA) 108 (90 Base) MCG/ACT inhaler 6 puff   AeroChamber Plus Flo-Vu Medium MISC 1 each   predniSONE (STERAPRED UNI-PAK 21 TAB) 10 MG (21) TBPK tablet    Sig: Dispense one 6 day pack. Take as directed with food.    Dispense:  21 tablet    Refill:  0      *This clinic note was created using Scientist, clinical (histocompatibility and immunogenetics). Therefore, there may be occasional mistakes despite careful proofreading.  ?    Domenick Gong, MD 06/11/21 [phone]

## 2021-06-10 NOTE — Discharge Instructions (Addendum)
2 puffs from albuterol inhaler every 4 hours for 2 days, then every 6 hours for 2 days, then as needed.  You may back off on it if you start to feel better sooner.  Finish prednisone.  It will elevate your sugars.  Adjust your insulin accordingly.

## 2021-07-19 NOTE — Progress Notes (Deleted)
Referring Provider: Billie Ruddy, MD Primary Care Physician:  Billie Ruddy, MD Primary GI Physician: Dr. Gala Romney  No chief complaint on file.   HPI:   Amy Lester is a 50 y.o. female with history of elevated LFTs thought to be secondary to fatty liver and possible med effect in the setting of Tylenol and diclofenac (Normalized November 2021 aside from mildly elevated alk phos intermittently with negative GGT), GERD, nausea without vomiting, chronic lower abdominal pain. EGD and colonoscopy on file from February 2022 with both procedures entirely normal.  Due for repeat colonoscopy in 2027 due to family history. CT A/P  November 2021 for lower abdominal pain with no acute findings.  Celiac screen negative April 2022.  TSH elevated in May 2022.  She is presenting today for follow-up.   Last seen in our office July 2022.  GERD fairly well controlled on omeprazole 40 mg which she was taking 1-3 times a day.  Continued with nausea off and on, vomiting x1 and July, using Zofran as needed. Query component of gastroparesis contributing to nausea/vomiting.  Continued with intermittent left lower abdominal pain which she felt was worsening lasting 30 minutes - 1+ hour ranging from mild to severe.  No identified triggers, not associated with activity, meals, or bowel movements.  No relieving factors.  Trial of Bentyl not helpful.  Bowels moving every other day to every 2 days which was her baseline.  Reported having a hemorrhoid with a little toilet tissue hematochezia.  Denied melena, unintentional weight loss.  She did report a history of endometriosis and stated she had not seen OB/GYN since moving here in 2019.  On exam, she had moderate to severe TTP in LLQ.   Query adhesive disease, endometriosis, GYN etiology, versus diverticulitis causing worsening abdominal pain.  Plan included omeprazole 40 mg twice daily every day, gastric emptying study, repeat CT A/P with contrast.  If no findings on CT,  refer to OB/GYN for further evaluation.  Also prescribed Anusol cream for hemorrhoids.  - CT A/P with no acute abnormalities.  She was referred to OB/GYN and prescribed Levsin to see if this would  provide any additional benefit.  - Gastric emptying study consistent with delayed gastric emptying, 38% emptied at 3.5 hours.  Recommended PPI twice daily, gastroparesis diet, Zofran every 8 hours as needed, and requested progress report in 4 weeks.  Consider Reglan if needed.  Patient has not seen OB/GYN.  Recently found to have prolonged QT   Today:  N/V:  GERD:  Elevated LFTs:  Abdominal Pain:   Past Medical History:  Diagnosis Date   Asthma    Blood transfusion without reported diagnosis    Chronic migraine    Common migraine with intractable migraine 02/16/2019   Diabetes mellitus without complication (HCC)    Endometriosis    Fatty liver    GERD (gastroesophageal reflux disease)    Hyperlipidemia    Hypertension    Hypothyroidism    Incontinence    Irregular heartbeat    Multiple sclerosis ([place])    Palpitations    Postconcussive syndrome 05/09/2020   Pre-diabetes    Sleep apnea     Past Surgical History:  Procedure Laterality Date   ABDOMINAL HYSTERECTOMY     CESAREAN SECTION     x1   CHOLECYSTECTOMY     COLONOSCOPY WITH PROPOFOL N/A 11/04/2020   Surgeon: Daneil Dolin, MD; normal exam.  Repeat in 10 years.   ENDOMETRIAL ABLATION  ESOPHAGOGASTRODUODENOSCOPY (EGD) WITH PROPOFOL N/A 11/04/2020   Surgeon: Daneil Dolin, MD; normal exam.   FOOT SURGERY Left 06/2018   foot reconstruction   REPLACEMENT TOTAL KNEE Left     Current Outpatient Medications  Medication Sig Dispense Refill   acetaminophen (TYLENOL) 500 MG tablet Take 500 mg by mouth as needed for moderate pain.     aspirin EC 81 MG tablet Take 1 tablet (81 mg total) by mouth daily. Swallow whole.     busPIRone (BUSPAR) 7.5 MG tablet Take 7.5 mg by mouth 2 (two) times daily as needed.      calcium carbonate (TUMS - DOSED IN MG ELEMENTAL CALCIUM) 500 MG chewable tablet Chew 2-3 tablets by mouth daily as needed for indigestion or heartburn.     cetirizine (ZYRTEC) 10 MG tablet Take 1 tablet (10 mg total) by mouth daily. 90 tablet 0   ciprofloxacin (CILOXAN) 0.3 % ophthalmic solution Place into both eyes daily.     clotrimazole-betamethasone (LOTRISONE) cream Apply 1 application topically as needed. (Patient taking differently: Apply 1 application topically as needed (Outbreak).) 45 g 2   Continuous Blood Gluc Sensor (FREESTYLE LIBRE 2 SENSOR) MISC 2 Devices by Does not apply route every 14 (fourteen) days. 2 each 3   Cyanocobalamin (B-12 COMPLIANCE INJECTION IJ) Inject 1 Dose as directed every 30 (thirty) days.     diclofenac (CATAFLAM) 50 MG tablet Take 1 tablet (50 mg total) by mouth 2 (two) times daily. 90 tablet 3   Erenumab-aooe (AIMOVIG, 140 MG DOSE,) 70 MG/ML SOAJ Inject 140 mg into the skin every 30 (thirty) days. (Patient taking differently: Inject 140 mg into the skin every 28 (twenty-eight) days.) 1.12 mL 5   escitalopram (LEXAPRO) 20 MG tablet Take 1 tablet by mouth daily.     estradiol (EVAMIST) 1.53 MG/SPRAY transdermal spray Place 3 sprays onto the skin daily.     Evolocumab (REPATHA SURECLICK) 854 MG/ML SOAJ Inject 140 mg into the skin every 14 (fourteen) days.     ezetimibe (ZETIA) 10 MG tablet Take 1 tablet (10 mg total) by mouth daily. 90 tablet 3   glipiZIDE (GLUCOTROL) 10 MG tablet Take 10 mg by mouth daily.      hydrocortisone (ANUSOL-HC) 2.5 % rectal cream Place 1 application rectally 2 (two) times daily. 30 g 1   Hyoscyamine Sulfate SL (LEVSIN/SL) 0.125 MG SUBL Place 1 tablet (0.125 mg) under the tongue every 6 hours as needed for abdominal pain.  Hold in the setting of constipation. 120 tablet 0   ibuprofen (ADVIL) 200 MG tablet Take 600-800 mg by mouth as needed for mild pain or moderate pain.     insulin degludec (TRESIBA FLEXTOUCH) 100 UNIT/ML FlexTouch Pen  Inject 20 units under the skin twice daily. (Patient taking differently: Inject 20 Units into the skin 2 (two) times daily.) 15 mL 0   insulin lispro (HUMALOG KWIKPEN) 100 UNIT/ML KwikPen Inject 15 Units into the skin 3 (three) times daily. 15 mL 5   isosorbide mononitrate (IMDUR) 30 MG 24 hr tablet Take 0.5 tablets (15 mg total) by mouth daily. 15 tablet 6   levothyroxine (SYNTHROID) 100 MCG tablet Take 100 mcg by mouth See admin instructions. Take with the 140mg to equal 225 mcg, 6 days per week. Skip Sundays     lisinopril (ZESTRIL) 5 MG tablet Take 1 tablet (5 mg total) by mouth daily. 90 tablet 1   MAGNESIUM OXIDE PO Take 1 tablet by mouth daily.     MELATONIN PO  Take 1 capsule by mouth at bedtime as needed (sleep).     metFORMIN (GLUCOPHAGE) 1000 MG tablet Take 1,000 mg by mouth 2 (two) times daily.     metoprolol tartrate (LOPRESSOR) 25 MG tablet Take 1 tablet (25 mg total) by mouth in the morning. 90 tablet 3   metoprolol tartrate (LOPRESSOR) 50 MG tablet Take 1 tablet (50 mg total) by mouth at bedtime. 90 tablet 3   metroNIDAZOLE (METROGEL) 1 % gel Apply 1 application topically at bedtime.      montelukast (SINGULAIR) 10 MG tablet Take 10 mg by mouth every morning.     Multiple Vitamins-Minerals (MULTIVITAMIN WITH MINERALS) tablet Take 1 tablet by mouth daily.     naproxen (NAPROSYN) 500 MG tablet Take 1 tablet (500 mg total) by mouth 2 (two) times daily with a meal. (Patient taking differently: Take 500 mg by mouth as needed.) 30 tablet 0   nitroGLYCERIN (NITROSTAT) 0.4 MG SL tablet Place 1 tablet (0.4 mg total) under the tongue every 5 (five) minutes as needed for chest pain. 25 tablet 3   nystatin cream (MYCOSTATIN) Apply 1 application topically daily as needed for dry skin.     Olopatadine HCl (PAZEO OP) Place 1 drop into both eyes daily.     omeprazole (PRILOSEC) 40 MG capsule Take 1 capsule (40 mg total) by mouth 2 (two) times daily before a meal. 60 capsule 3   ondansetron (ZOFRAN)  4 MG tablet Take 1 tablet (4 mg total) by mouth every 8 (eight) hours as needed for nausea or vomiting. (Patient taking differently: Take 4 mg by mouth as needed for nausea or vomiting.) 20 tablet 1   ondansetron (ZOFRAN) 8 MG tablet Take 1 tablet (8 mg total) by mouth every 8 (eight) hours as needed for nausea or vomiting. 20 tablet 1   predniSONE (STERAPRED UNI-PAK 21 TAB) 10 MG (21) TBPK tablet Dispense one 6 day pack. Take as directed with food. 21 tablet 0   Red Yeast Rice Extract (RED YEAST RICE PO) Take 1 tablet by mouth daily.      Riboflavin (VITAMIN B-2 PO) Take 1 tablet by mouth daily.      rizatriptan (MAXALT) 10 MG tablet Take 1 tablet (10 mg total) by mouth 3 (three) times daily as needed for migraine. (Patient taking differently: Take 10 mg by mouth as needed for migraine.) 10 tablet 5   Semaglutide, 1 MG/DOSE, (OZEMPIC, 1 MG/DOSE,) 2 MG/1.5ML SOPN Inject 1 mg into the skin once a week. (Patient taking differently: Inject 1 mg into the skin every Sunday.) 4.5 mL 1   SYNTHROID 125 MCG tablet Take two tablets (27m total) by mouth days 1-6 and skip day 7. (Patient taking differently: Take 125 mcg by mouth See admin instructions. Take with 100 mcg to equal 225 mcg, 6 days per week. Skip Sundays) 180 tablet 1   Tretinoin, Facial Wrinkles, (RENOVA) 0.02 % CREA Apply 1 application topically at bedtime.     triamcinolone cream (KENALOG) 0.1 % Apply 1 application topically 2 (two) times daily as needed. (Patient taking differently: Apply 1 application topically 2 (two) times daily as needed (rash).) 45 g 0   triamterene-hydrochlorothiazide (MAXZIDE-25) 37.5-25 MG tablet Take 1 tablet by mouth daily. 90 tablet 3   venlafaxine XR (EFFEXOR XR) 75 MG 24 hr capsule Take 1 capsule (75 mg total) by mouth daily with breakfast. 30 capsule 5   Vitamin D, Ergocalciferol, (DRISDOL) 1.25 MG (50000 UNIT) CAPS capsule Take 1 capsule (50,000 Units  total) by mouth every 7 (seven) days. 12 capsule 0   zonisamide  (ZONEGRAN) 100 MG capsule Take 1 capsule daily 30 capsule 5   No current facility-administered medications for this visit.    Allergies as of 07/21/2021 - Review Complete 06/10/2021  Allergen Reaction Noted   Crestor [rosuvastatin]  08/27/2020   Lipitor [atorvastatin]  08/27/2020   Percocet [oxycodone-acetaminophen] Nausea And Vomiting 11/24/2018   Zocor [simvastatin]  08/27/2020    Family History  Problem Relation Age of Onset   Heart attack Mother    Stroke Mother    Cancer Mother        breast cancer   Hyperlipidemia Mother    Hypothyroidism Mother    Cancer Father        lymph nodes, liver cancer   Colon cancer Father        early 36s   Other Daughter        Fine Gold Type II Syndrome and cognetive issues   Memory loss Daughter    Other Son        Fine Gold Syndrome and BPES   Diabetes Son 75       IDDM   Hypothyroidism Sister    Cancer Sister    Hyperthyroidism Neg Hx     Social History   Socioeconomic History   Marital status: Married    Spouse name: Not on file   Number of children: Not on file   Years of education: 14   Highest education level: Not on file  Occupational History   Not on file  Tobacco Use   Smoking status: Never   Smokeless tobacco: Never  Vaping Use   Vaping Use: Never used  Substance and Sexual Activity   Alcohol use: Yes    Comment: once a year   Drug use: Never   Sexual activity: Not on file  Other Topics Concern   Not on file  Social History Narrative   Left handed    Caffeine 1 daily    Lives at home with husband and adult daughter   One story home   Social Determinants of Health   Financial Resource Strain: Not on file  Food Insecurity: Not on file  Transportation Needs: Not on file  Physical Activity: Not on file  Stress: Not on file  Social Connections: Not on file    Review of Systems: Gen: Denies fever, chills, anorexia. Denies fatigue, weakness, weight loss.  CV: Denies chest pain, palpitations, syncope,  peripheral edema, and claudication. Resp: Denies dyspnea at rest, cough, wheezing, coughing up blood, and pleurisy. GI: Denies vomiting blood, jaundice, and fecal incontinence.   Denies dysphagia or odynophagia. Derm: Denies rash, itching, dry skin Psych: Denies depression, anxiety, memory loss, confusion. No homicidal or suicidal ideation.  Heme: Denies bruising, bleeding, and enlarged lymph nodes.  Physical Exam: There were no vitals taken for this visit. General:   Alert and oriented. No distress noted. Pleasant and cooperative.  Head:  Normocephalic and atraumatic. Eyes:  Conjuctiva clear without scleral icterus. Mouth:  Oral mucosa pink and moist. Good dentition. No lesions. Heart:  S1, S2 present without murmurs appreciated. Lungs:  Clear to auscultation bilaterally. No wheezes, rales, or rhonchi. No distress.  Abdomen:  +BS, soft, non-tender and non-distended. No rebound or guarding. No HSM or masses noted. Msk:  Symmetrical without gross deformities. Normal posture. Extremities:  Without edema. Neurologic:  Alert and  oriented x4 Psych:  Alert and cooperative. Normal mood and affect.

## 2021-07-21 ENCOUNTER — Ambulatory Visit: Payer: Medicaid Other | Admitting: Gastroenterology

## 2021-09-22 ENCOUNTER — Ambulatory Visit: Payer: Medicaid Other | Admitting: Endocrinology

## 2021-09-29 ENCOUNTER — Telehealth: Payer: Self-pay | Admitting: Family Medicine

## 2021-09-29 NOTE — Progress Notes (Signed)
ACUTE VISIT Chief Complaint  Patient presents with   Headache   Nasal Congestion   Cough    X almost 2 weeks, eyes started to drain    HPI: Ms.Amy Lester is a 51 y.o. female with hx of ,MS,OSA,asthma, hypothyroidism,DM II,and GERD here today with above complaints.  URI  The current episode started 1 to 4 weeks ago. The problem has been gradually improving. Associated symptoms include congestion, coughing, ear pain, headaches, nausea, a plugged ear sensation, rhinorrhea, a sore throat and wheezing. Pertinent negatives include no abdominal pain, chest pain, diarrhea, dysuria, joint swelling, neck pain, rash, sinus pain, swollen glands or vomiting. She has tried NSAIDs, acetaminophen, decongestant and antihistamine for the symptoms. The treatment provided mild relief.  Productive cough , blood in sputum x 1. She is not sure about fever but has had chills and body aches. Bilateral earache.  She is using Albuterol inh prn.  Epiphora, "crusty" areas around eyes. She has ciprofloxacin eye drips she uses as needed. Negative for conjunctival erythema.  Home COVID 19 test negative.  About 10 days ago she completed abx treatment, Augmentin.  Fatigue, she wonders if it is because she missed B12 injection or due to low iron.  Her husband was "very" sick with similar symptoms.  Review of Systems  Constitutional:  Positive for activity change, chills and fatigue.  HENT:  Positive for congestion, ear pain, postnasal drip, rhinorrhea and sore throat. Negative for mouth sores and sinus pain.   Eyes:  Positive for itching. Negative for photophobia.  Respiratory:  Positive for cough and wheezing.   Cardiovascular:  Negative for chest pain and leg swelling.  Gastrointestinal:  Positive for nausea. Negative for abdominal pain, diarrhea and vomiting.  Genitourinary:  Negative for decreased urine volume, dysuria and hematuria.  Musculoskeletal:  Negative for gait problem and neck pain.  Skin:   Negative for rash.  Neurological:  Positive for headaches.  Rest see pertinent positives and negatives per HPI.  Current Outpatient Medications on File Prior to Visit  Medication Sig Dispense Refill   acetaminophen (TYLENOL) 500 MG tablet Take 500 mg by mouth as needed for moderate pain.     aspirin EC 81 MG tablet Take 1 tablet (81 mg total) by mouth daily. Swallow whole.     busPIRone (BUSPAR) 7.5 MG tablet Take 7.5 mg by mouth 2 (two) times daily as needed.     calcium carbonate (TUMS - DOSED IN MG ELEMENTAL CALCIUM) 500 MG chewable tablet Chew 2-3 tablets by mouth daily as needed for indigestion or heartburn.     cetirizine (ZYRTEC) 10 MG tablet Take 1 tablet (10 mg total) by mouth daily. 90 tablet 0   ciprofloxacin (CILOXAN) 0.3 % ophthalmic solution Place into both eyes daily.     clotrimazole-betamethasone (LOTRISONE) cream Apply 1 application topically as needed. (Patient taking differently: Apply 1 application topically as needed (Outbreak).) 45 g 2   Continuous Blood Gluc Sensor (FREESTYLE LIBRE 2 SENSOR) MISC 2 Devices by Does not apply route every 14 (fourteen) days. 2 each 3   Cyanocobalamin (B-12 COMPLIANCE INJECTION IJ) Inject 1 Dose as directed every 30 (thirty) days.     diclofenac (CATAFLAM) 50 MG tablet Take 1 tablet (50 mg total) by mouth 2 (two) times daily. 90 tablet 3   Erenumab-aooe (AIMOVIG, 140 MG DOSE,) 70 MG/ML SOAJ Inject 140 mg into the skin every 30 (thirty) days. (Patient taking differently: Inject 140 mg into the skin every 28 (twenty-eight) days.) 1.12 mL 5  escitalopram (LEXAPRO) 20 MG tablet Take 1 tablet by mouth daily.     Evolocumab (REPATHA SURECLICK) [id number] MG/ML SOAJ Inject 140 mg into the skin every 14 (fourteen) days.     ezetimibe (ZETIA) 10 MG tablet Take 1 tablet (10 mg total) by mouth daily. 90 tablet 3   glipiZIDE (GLUCOTROL) 10 MG tablet Take 10 mg by mouth daily.      hydrocortisone (ANUSOL-HC) 2.5 % rectal cream Place 1 application rectally 2  (two) times daily. 30 g 1   ibuprofen (ADVIL) 200 MG tablet Take 600-800 mg by mouth as needed for mild pain or moderate pain.     insulin degludec (TRESIBA FLEXTOUCH) 100 UNIT/ML FlexTouch Pen Inject 20 units under the skin twice daily. (Patient taking differently: Inject 20 Units into the skin 2 (two) times daily.) 15 mL 0   insulin lispro (HUMALOG KWIKPEN) 100 UNIT/ML KwikPen Inject 15 Units into the skin 3 (three) times daily. 15 mL 5   isosorbide mononitrate (IMDUR) 30 MG 24 hr tablet Take 0.5 tablets (15 mg total) by mouth daily. 15 tablet 6   levothyroxine (SYNTHROID) 100 MCG tablet Take 100 mcg by mouth See admin instructions. Take with the 164mcg to equal 225 mcg, 6 days per week. Skip Sundays     MAGNESIUM OXIDE PO Take 1 tablet by mouth daily.     MELATONIN PO Take 1 capsule by mouth at bedtime as needed (sleep).     metFORMIN (GLUCOPHAGE) 1000 MG tablet Take 1,000 mg by mouth 2 (two) times daily.     metoprolol tartrate (LOPRESSOR) 25 MG tablet Take 1 tablet (25 mg total) by mouth in the morning. 90 tablet 3   metoprolol tartrate (LOPRESSOR) 50 MG tablet Take 1 tablet (50 mg total) by mouth at bedtime. 90 tablet 3   metroNIDAZOLE (METROGEL) 1 % gel Apply 1 application topically at bedtime.      montelukast (SINGULAIR) 10 MG tablet Take 10 mg by mouth every morning.     Multiple Vitamins-Minerals (MULTIVITAMIN WITH MINERALS) tablet Take 1 tablet by mouth daily.     nitroGLYCERIN (NITROSTAT) 0.4 MG SL tablet Place 1 tablet (0.4 mg total) under the tongue every 5 (five) minutes as needed for chest pain. 25 tablet 3   nystatin cream (MYCOSTATIN) Apply 1 application topically daily as needed for dry skin.     Olopatadine HCl (PAZEO OP) Place 1 drop into both eyes daily.     omeprazole (PRILOSEC) 40 MG capsule Take 1 capsule (40 mg total) by mouth 2 (two) times daily before a meal. 60 capsule 3   ondansetron (ZOFRAN) 4 MG tablet Take 1 tablet (4 mg total) by mouth every 8 (eight) hours as  needed for nausea or vomiting. (Patient taking differently: Take 4 mg by mouth as needed for nausea or vomiting.) 20 tablet 1   ondansetron (ZOFRAN) 8 MG tablet Take 1 tablet (8 mg total) by mouth every 8 (eight) hours as needed for nausea or vomiting. 20 tablet 1   Red Yeast Rice Extract (RED YEAST RICE PO) Take 1 tablet by mouth daily.      Riboflavin (VITAMIN B-2 PO) Take 1 tablet by mouth daily.      rizatriptan (MAXALT) 10 MG tablet Take 1 tablet (10 mg total) by mouth 3 (three) times daily as needed for migraine. (Patient taking differently: Take 10 mg by mouth as needed for migraine.) 10 tablet 5   Semaglutide, 1 MG/DOSE, (OZEMPIC, 1 MG/DOSE,) 2 MG/1.5ML SOPN Inject 1 mg  into the skin once a week. (Patient taking differently: Inject 1 mg into the skin every Sunday.) 4.5 mL 1   SYNTHROID 125 MCG tablet Take two tablets (250mg  total) by mouth days 1-6 and skip day 7. (Patient taking differently: Take 125 mcg by mouth See admin instructions. Take with 100 mcg to equal 225 mcg, 6 days per week. Skip Sundays) 180 tablet 1   Tretinoin, Facial Wrinkles, (RENOVA) 0.02 % CREA Apply 1 application topically at bedtime.     triamcinolone cream (KENALOG) 0.1 % Apply 1 application topically 2 (two) times daily as needed. (Patient taking differently: Apply 1 application topically 2 (two) times daily as needed (rash).) 45 g 0   triamterene-hydrochlorothiazide (MAXZIDE-25) 37.5-25 MG tablet Take 1 tablet by mouth daily. 90 tablet 3   venlafaxine XR (EFFEXOR XR) 75 MG 24 hr capsule Take 1 capsule (75 mg total) by mouth daily with breakfast. 30 capsule 5   Vitamin D, Ergocalciferol, (DRISDOL) 1.25 MG (50000 UNIT) CAPS capsule Take 1 capsule (50,000 Units total) by mouth every 7 (seven) days. 12 capsule 0   zonisamide (ZONEGRAN) 100 MG capsule Take 1 capsule daily 30 capsule 5   estradiol (EVAMIST) 1.53 MG/SPRAY transdermal spray Place 3 sprays onto the skin daily. (Patient not taking: Reported on 09/30/2021)      lisinopril (ZESTRIL) 5 MG tablet Take 1 tablet (5 mg total) by mouth daily. 90 tablet 1   No current facility-administered medications on file prior to visit.   Past Medical History:  Diagnosis Date   Asthma    Blood transfusion without reported diagnosis    Chronic migraine    Common migraine with intractable migraine 02/16/2019   Diabetes mellitus without complication (HCC)    Endometriosis    Fatty liver    GERD (gastroesophageal reflux disease)    Hyperlipidemia    Hypertension    Hypothyroidism    Incontinence    Irregular heartbeat    Multiple sclerosis (HCC)    Palpitations    Postconcussive syndrome 05/09/2020   Pre-diabetes    Sleep apnea    Allergies  Allergen Reactions   Crestor [Rosuvastatin]     myalgia   Lipitor [Atorvastatin]     myalgia   Percocet [Oxycodone-Acetaminophen] Nausea And Vomiting   Zocor [Simvastatin]     myalgia    Social History   Socioeconomic History   Marital status: Married    Spouse name: Not on file   Number of children: Not on file   Years of education: 14   Highest education level: Not on file  Occupational History   Not on file  Tobacco Use   Smoking status: Never   Smokeless tobacco: Never  Vaping Use   Vaping Use: Never used  Substance and Sexual Activity   Alcohol use: Yes    Comment: once a year   Drug use: Never   Sexual activity: Not on file  Other Topics Concern   Not on file  Social History Narrative   Left handed    Caffeine 1 daily    Lives at home with husband and adult daughter   One story home   Social Determinants of Health   Financial Resource Strain: Not on file  Food Insecurity: Not on file  Transportation Needs: Not on file  Physical Activity: Not on file  Stress: Not on file  Social Connections: Not on file    Vitals:   09/30/21 1401  BP: 132/70  Pulse: 86  Resp: 16  Temp:  98.8 F (37.1 C)  SpO2: 98%   Body mass index is 36.48 kg/m.  Physical Exam Vitals and nursing note  reviewed.  Constitutional:      General: She is not in acute distress.    Appearance: She is well-developed. She is not ill-appearing.  HENT:     Head: Normocephalic and atraumatic.     Right Ear: Tympanic membrane, ear canal and external ear normal.     Left Ear: Ear canal and external ear normal. Tympanic membrane is bulging.     Ears:     Comments: Left TM with mild erythema limited to anterior inferior area.    Nose: Rhinorrhea present.     Right Sinus: No maxillary sinus tenderness or frontal sinus tenderness.     Left Sinus: No maxillary sinus tenderness or frontal sinus tenderness.     Mouth/Throat:     Mouth: Mucous membranes are moist.     Pharynx: Oropharynx is clear.  Eyes:     Conjunctiva/sclera: Conjunctivae normal.  Cardiovascular:     Rate and Rhythm: Normal rate and regular rhythm.     Heart sounds: No murmur heard. Pulmonary:     Effort: Pulmonary effort is normal. No respiratory distress.     Breath sounds: Decreased air movement present. No stridor. Wheezing present. No rhonchi or rales.  Musculoskeletal:     Cervical back: No edema or erythema. No muscular tenderness.  Lymphadenopathy:     Head:     Right side of head: No submandibular adenopathy.     Left side of head: No submandibular adenopathy.     Cervical: No cervical adenopathy.  Skin:    General: Skin is warm.     Findings: No erythema or rash.  Neurological:     Mental Status: She is alert and oriented to person, place, and time.  Psychiatric:        Mood and Affect: Mood is anxious.     Comments: Well groomed, good eye contact.   ASSESSMENT AND PLAN:  Amy Lester was seen today for headache, nasal congestion and cough.  Diagnoses and all orders for this visit: Orders Placed This Encounter  Procedures   DG Chest 2 View   CBC   Basic metabolic panel   Lab Results  Component Value Date   WBC 6.8 09/30/2021   HGB 13.7 09/30/2021   HCT 41.4 09/30/2021   MCV 88.0 09/30/2021   PLT 294.0  09/30/2021   Lab Results  Component Value Date   CREATININE 0.82 09/30/2021   BUN 15 09/30/2021   NA 138 09/30/2021   K 3.6 09/30/2021   CL 102 09/30/2021   CO2 25 09/30/2021   Moderate asthma with exacerbation, unspecified whether persistent After discussion of side effects she agrees with Duoneb treatment, she did not feel better after but lung ventilation improved, wheezing is still present. No rales or rhonchi. She has taken Prednisone in the past and well tolerated. Prednisone 40 mg daily x 5 days with breakfast. Albuterol inh 2 puff every 6 hours for a week then as needed for wheezing or shortness of breath.  Instructed about warning signs.  -     predniSONE (DELTASONE) 20 MG tablet; Take 2 tablets (40 mg total) by mouth daily with breakfast for 5 days. -     ipratropium-albuterol (DUONEB) 0.5-2.5 (3) MG/3ML nebulizer solution 3 mL  Earache symptoms, bilateral Right ear examination is negative. Left with mild meddle ear effusion and erythema. She just completed Augmentin treatment. I do  not think more abx is needed at this time. Auto inflation maneuvers may help with possible eustachian tube dysfunction. Instructed about warning signs. F/U in 7 days.  Cough, unspecified type Explained that cough and congestion can last a few more days and even weeks after URI.  Plain mucinex may help. She is concerned about pneumonia, CXR ordered. Adequate hydration.  Other fatigue It seems to be chronic,aggravated by recent URI. Requesting B12 injection,recommend, not clear if she was supposed to continue, last one in 03/2021 ,recommend following with PCP Further recommendations according to lab results.  I spent a total of 40 minutes in both face to face and non face to face activities for this visit on the date of this encounter. During this time history was obtained and documented, examination was performed, and assessment/plan discussed.  Return in about 1 week (around 10/07/2021) for  wheezing with PCP.  Daphyne Miguez G. [place], MD  [facility]. [place] office.

## 2021-09-29 NOTE — Telephone Encounter (Signed)
Patient calling in with respiratory symptoms: Shortness of breath, chest pain, palpitations or other red words send to Triage  Does the patient have a fever over 100, cough, congestion, sore throat, runny nose, lost of taste/smell (please list symptoms that patient has)? ear pain/head stopped up x 1 1/2 weeks  What date did symptoms start? 1 1/2 weeks ago (If over 5 days ago, pt may be scheduled for in person visit)  Have you tested for Covid in the last 5 days? Yes   If yes, was it positive []  OR negative [x] ? If positive in the last 5 days, please schedule virtual visit now. If negative, schedule for an in person OV with the next available provider if PCP has no openings. Please also let patient know they will be tested again (follow the script below)  "you will have to arrive 37mins prior to your appt time to be Covid tested. Please park in back of office at the [facility] & call [phone] to let the staff know you have arrived. A staff member will meet you at your car to do a rapid covid test. Once the test has resulted you will be notified by phone of your results to determine if appt will remain an in person visit or be converted to a virtual/phone visit. If you arrive less than 106mins before your appt time, your visit will be automatically converted to virtual & any recommended testing will happen AFTER the visit."   [place]  If no availability for virtual visit in office,  please schedule another [place] office  If no availability at another [place] office, please instruct patient that they can schedule an evisit or virtual visit through their mychart account. Visits up to 8pm  patients can be seen in office 5 days after positive COVID test

## 2021-09-30 ENCOUNTER — Ambulatory Visit (INDEPENDENT_AMBULATORY_CARE_PROVIDER_SITE_OTHER): Payer: Medicaid Other | Admitting: Family Medicine

## 2021-09-30 ENCOUNTER — Other Ambulatory Visit: Payer: Self-pay

## 2021-09-30 ENCOUNTER — Ambulatory Visit (INDEPENDENT_AMBULATORY_CARE_PROVIDER_SITE_OTHER): Payer: Medicaid Other

## 2021-09-30 ENCOUNTER — Encounter: Payer: Self-pay | Admitting: Family Medicine

## 2021-09-30 VITALS — BP 132/70 | HR 86 | Temp 98.8°F | Resp 16 | Ht 64.0 in | Wt 212.5 lb

## 2021-09-30 DIAGNOSIS — R059 Cough, unspecified: Secondary | ICD-10-CM

## 2021-09-30 DIAGNOSIS — H9203 Otalgia, bilateral: Secondary | ICD-10-CM

## 2021-09-30 DIAGNOSIS — J45901 Unspecified asthma with (acute) exacerbation: Secondary | ICD-10-CM

## 2021-09-30 DIAGNOSIS — R5383 Other fatigue: Secondary | ICD-10-CM

## 2021-09-30 LAB — BASIC METABOLIC PANEL
BUN: 15 mg/dL (ref 6–23)
CO2: 25 mEq/L (ref 19–32)
Calcium: 9.5 mg/dL (ref 8.4–10.5)
Chloride: 102 mEq/L (ref 96–112)
Creatinine, Ser: 0.82 mg/dL (ref 0.40–1.20)
GFR: 82.33 mL/min (ref >60.00)
Glucose, Bld: 150 mg/dL — ABNORMAL HIGH (ref 70–99)
Potassium: 3.6 mEq/L (ref 3.5–5.1)
Sodium: 138 mEq/L (ref 135–145)

## 2021-09-30 LAB — CBC
HCT: 41.4 % (ref 36.0–46.0)
Hemoglobin: 13.7 g/dL (ref 12.0–15.0)
MCHC: 33.2 g/dL (ref 30.0–36.0)
MCV: 88 fl (ref 78.0–100.0)
Platelets: 294 10*3/uL (ref 150.0–400.0)
RBC: 4.7 Mil/uL (ref 3.87–5.11)
RDW: 13.3 % (ref 11.5–15.5)
WBC: 6.8 10*3/uL (ref 4.0–10.5)

## 2021-09-30 MED ORDER — PREDNISONE 20 MG PO TABS: 40.0000 mg | ORAL_TABLET | Freq: Every day | ORAL | 0 refills | Status: AC

## 2021-09-30 MED ORDER — IPRATROPIUM-ALBUTEROL 0.5-2.5 (3) MG/3ML IN SOLN
3.0000 mL | Freq: Once | RESPIRATORY_TRACT | Status: AC
  Administered 2021-09-30: 15:00:00 3 mL via RESPIRATORY_TRACT

## 2021-09-30 NOTE — Patient Instructions (Addendum)
A few things to remember from today's visit:  Cough, unspecified type - Plan: DG Chest 2 View  Mild reactive airways disease, unspecified whether persistent - Plan: predniSONE (DELTASONE) 20 MG tablet, ipratropium-albuterol (DUONEB) 0.5-2.5 (3) MG/3ML nebulizer solution 3 mL  Dysfunction of both eustachian tubes  Other fatigue - Plan: CBC, Basic metabolic panel  If you need refills please call your pharmacy. Do not use My Chart to request refills or for acute issues that need immediate attention.   Take prednisone with breakfast. Albuterol inh 2 puff every 6 hours for a week then as needed for wheezing or shortness of breath.  Monitor temperature, let her know if 100 F or higher. Plain mucinex may help.  I do not think more antibiotic is needed.  Please be sure medication list is accurate. If a new problem present, please set up appointment sooner than planned today.

## 2021-10-15 ENCOUNTER — Other Ambulatory Visit (HOSPITAL_COMMUNITY)
Admission: RE | Admit: 2021-10-15 | Discharge: 2021-10-15 | Disposition: A | Discharge status: Home / Self Care | Payer: Medicaid Other | Source: Ambulatory Visit | Attending: Family Medicine | Admitting: Family Medicine

## 2021-10-15 ENCOUNTER — Ambulatory Visit (INDEPENDENT_AMBULATORY_CARE_PROVIDER_SITE_OTHER): Payer: Medicaid Other | Admitting: Family Medicine

## 2021-10-15 ENCOUNTER — Encounter: Payer: Self-pay | Admitting: Family Medicine

## 2021-10-15 VITALS — BP 128/88 | HR 78 | Temp 97.2°F | Resp 16 | Ht 64.0 in | Wt 212.2 lb

## 2021-10-15 DIAGNOSIS — L539 Erythematous condition, unspecified: Secondary | ICD-10-CM

## 2021-10-15 DIAGNOSIS — N760 Acute vaginitis: Secondary | ICD-10-CM | POA: Insufficient documentation

## 2021-10-15 DIAGNOSIS — R5383 Other fatigue: Secondary | ICD-10-CM

## 2021-10-15 DIAGNOSIS — M79621 Pain in right upper arm: Secondary | ICD-10-CM

## 2021-10-15 DIAGNOSIS — E1169 Type 2 diabetes mellitus with other specified complication: Secondary | ICD-10-CM

## 2021-10-15 LAB — CBC WITH DIFFERENTIAL/PLATELET
Basophils Absolute: 0 10*3/uL (ref 0.0–0.1)
Basophils Relative: 0.7 % (ref 0.0–3.0)
Eosinophils Absolute: 0.2 10*3/uL (ref 0.0–0.7)
Eosinophils Relative: 2.9 % (ref 0.0–5.0)
HCT: 43.1 % (ref 36.0–46.0)
Hemoglobin: 14.3 g/dL (ref 12.0–15.0)
Lymphocytes Relative: 27.1 % (ref 12.0–46.0)
Lymphs Abs: 1.8 10*3/uL (ref 0.7–4.0)
MCHC: 33.1 g/dL (ref 30.0–36.0)
MCV: 88.2 fl (ref 78.0–100.0)
Monocytes Absolute: 0.4 10*3/uL (ref 0.1–1.0)
Monocytes Relative: 5.5 % (ref 3.0–12.0)
Neutro Abs: 4.3 10*3/uL (ref 1.4–7.7)
Neutrophils Relative %: 63.8 % (ref 43.0–77.0)
Platelets: 268 10*3/uL (ref 150.0–400.0)
RBC: 4.89 Mil/uL (ref 3.87–5.11)
RDW: 13.1 % (ref 11.5–15.5)
WBC: 6.8 10*3/uL (ref 4.0–10.5)

## 2021-10-15 LAB — T4, FREE: Free T4: 1.24 ng/dL (ref 0.60–1.60)

## 2021-10-15 LAB — HEMOGLOBIN A1C: Hgb A1c MFr Bld: 7.8 % — ABNORMAL HIGH (ref 4.6–6.5)

## 2021-10-15 LAB — BASIC METABOLIC PANEL
BUN: 16 mg/dL (ref 6–23)
CO2: 22 mEq/L (ref 19–32)
Calcium: 10.2 mg/dL (ref 8.4–10.5)
Chloride: 102 mEq/L (ref 96–112)
Creatinine, Ser: 0.76 mg/dL (ref 0.40–1.20)
GFR: 90.17 mL/min (ref >60.00)
Glucose, Bld: 122 mg/dL — ABNORMAL HIGH (ref 70–99)
Potassium: 3.8 mEq/L (ref 3.5–5.1)
Sodium: 139 mEq/L (ref 135–145)

## 2021-10-15 LAB — VITAMIN B12: Vitamin B-12: 414 pg/mL (ref 211–911)

## 2021-10-15 LAB — VITAMIN D 25 HYDROXY (VIT D DEFICIENCY, FRACTURES): VITD: 24.98 ng/mL — ABNORMAL LOW (ref 30.00–100.00)

## 2021-10-15 LAB — TSH: TSH: 0.2 u[IU]/mL — ABNORMAL LOW (ref 0.35–5.50)

## 2021-10-15 MED ORDER — FLUCONAZOLE 150 MG PO TABS: 150.0000 mg | ORAL_TABLET | Freq: Once | ORAL | 0 refills | Status: AC

## 2021-10-15 MED ORDER — FLUCONAZOLE 150 MG PO TABS: 150.0000 mg | ORAL_TABLET | Freq: Once | ORAL | 0 refills | Status: DC

## 2021-10-15 NOTE — Progress Notes (Signed)
Subjective:    Patient ID: Amy Lester, female    DOB: [birth year], 51 y.o.   MRN: [id number]  Chief Complaint  Patient presents with   Follow-up    Pain under Right arm pit Yeast Infection symptoms  Weight Loss RX questions      HPI Patient was seen today for several ongoing concerns.  Patient endorses increased fatigue x2 weeks.  Having to take naps during the day.  Using CPAP nightly.  Inquires if iron is low.  Also inquires about vitamin B12 being low as had a few injections several months ago.  Right axilla tenderness.  Denies any changes of soaps, lotions, detergents, bumps, erythema.  Patient does not use deodorant.  States had similar symptoms several years ago with tenderness in the same area.  Imaging was negative at that time.  Patient notes vaginal discharge and irritation x3-4 days.  Has not tried anything for symptoms.  Stopped taking estradiol spray.  Patient inquires about weight loss medication as she feels it would help with the joint pain.  Patient endorses redness/irritation of face since this morning after trying a new cream with hyaluronic acid.  Patient tried to wash her face off while here in clinic.  Past Medical History:  Diagnosis Date   Asthma    Blood transfusion without reported diagnosis    Chronic migraine    Common migraine with intractable migraine 02/16/2019   Diabetes mellitus without complication (HCC)    Endometriosis    Fatty liver    GERD (gastroesophageal reflux disease)    Hyperlipidemia    Hypertension    Hypothyroidism    Incontinence    Irregular heartbeat    Multiple sclerosis (HCC)    Palpitations    Postconcussive syndrome 05/09/2020   Pre-diabetes    Sleep apnea     Allergies  Allergen Reactions   Crestor [Rosuvastatin]     myalgia   Lipitor [Atorvastatin]     myalgia   Percocet [Oxycodone-Acetaminophen] Nausea And Vomiting   Zocor [Simvastatin]     myalgia    ROS General: Denies fever, chills, night sweats, changes in  appetite +weight gain, fatigue HEENT: Denies headaches, ear pain, changes in vision, rhinorrhea, sore throat + watery eyes CV: Denies CP, palpitations, SOB, orthopnea Pulm: Denies SOB, cough, wheezing GI: Denies abdominal pain, nausea, vomiting, diarrhea, constipation GU: Denies dysuria, hematuria, frequency +vaginal discharge, irritation Msk: Denies muscle cramps, joint pains Neuro: Denies weakness, numbness, tingling Skin: Denies rashes, bruising  +sore area in R axilla, erythema/irritation of face Psych: Denies depression, anxiety, hallucinations     Objective:    Blood pressure 128/88, pulse 78, temperature (!) 97.2 F (36.2 C), resp. rate 16, height 5\' 4"  (1.626 m), weight 212 lb 3.2 oz (96.3 kg).Body mass index is 36.42 kg/m.  Gen. Pleasant, well-nourished, in no distress, normal affect   HEENT: [place]/AT, face symmetric, erythema and mild edema of face with watery eyes, conjunctiva clear, no scleral icterus, PERRLA, EOMI, nares patent without drainage Lungs: no accessory muscle use, CTAB, no wheezes or rales Cardiovascular: RRR, no m/r/g, no peripheral edema Musculoskeletal: No deformities, no cyanosis or clubbing, normal tone GU: Aptima self swab done. Neuro:  A&Ox3, CN II-XII intact, normal gait Skin:  Warm, no lesions/ rash.  TTP and mild edema of right axilla without palpable mass or lesion, no skin changes, streaking, erythema, increased warmth.   Wt Readings from Last 3 Encounters:  09/30/21 212 lb 8 oz (96.4 kg)  03/25/21 225 lb (102.1 kg)  03/18/21 225 lb (102.1 kg)    Lab Results  Component Value Date   WBC 6.8 09/30/2021   HGB 13.7 09/30/2021   HCT 41.4 09/30/2021   PLT 294.0 09/30/2021   GLUCOSE 150 (H) 09/30/2021   CHOL 277 (H) 01/13/2021   TRIG 196.0 (H) 01/13/2021   HDL 59.40 01/13/2021   LDLCALC 178 (H) 01/13/2021   ALT 30 01/13/2021   AST 22 01/13/2021   NA 138 09/30/2021   K 3.6 09/30/2021   CL 102 09/30/2021   CREATININE 0.82 09/30/2021   BUN 15  09/30/2021   CO2 25 09/30/2021   TSH 9.47 (H) 01/13/2021   HGBA1C 7.6 (H) 01/13/2021   MICROALBUR 1.6 01/13/2021    Assessment/Plan:  Fatigue, unspecified type  -Discussed possible causes -Continue wearing CPAP nightly -We will evaluate for electrolyte or vitamin deficiency - Plan: Vitamin B12, CBC with Differential/Platelet, Basic metabolic panel, TSH, T4, Free, Iron, TIBC and Ferritin Panel, Vitamin D, 25-hydroxy  Acute vaginitis  -Aptima self swab obtained -start Diflucan -We will send in additional medication if needed based on results. - Plan: Cervicovaginal ancillary only, fluconazole (DIFLUCAN) 150 MG tablet  Pain in right axilla -Symptomatic care including warm compresses -For continued or worsening symptoms obtain ultrasound as no palpable deformity noted on exam. - Plan: CBC with Differential/Platelet  Morbid obesity ([place])  -Discussed importance of lifestyle modifications, however exercise difficult 2/2 foot pain. -on Ozempic, without wt loss. -Patient interested in starting phentermine.   -We will obtain labs. - Plan: Vitamin D, 25-hydroxy  Type 2 diabetes mellitus with other specified complication, without long-term current use of insulin (HCC) -Hemoglobin A1c 7.6% on 01/13/2021 -Continue current medications including sliding scale Humalog, Tresiba, metformin 1000 mg twice daily, Ozempic 1 mg weekly -Continue follow-up with endocrinology, Dr. Dwyane Dee - Plan: Hemoglobin A1c  Facial erythema -2/2 reaction to new products with hyaluronic acid.  D/c use of new product. -Continue using gentle to cleanse face -OTC topical hydrocortisone 1% cream and ice. -Advised skin of face may peel. -Use of sunscreen advised.    F/u as needed in the next few weeks  Grier Mitts, MD

## 2021-10-16 LAB — IRON,TIBC AND FERRITIN PANEL
%SAT: 28 % (calc) (ref 16–45)
Ferritin: 291 ng/mL — ABNORMAL HIGH (ref 16–232)
Iron: 101 ug/dL (ref 45–160)
TIBC: 361 mcg/dL (calc) (ref 250–450)

## 2021-10-16 LAB — CERVICOVAGINAL ANCILLARY ONLY
Bacterial Vaginitis (gardnerella): NEGATIVE
Candida Glabrata: NEGATIVE
Candida Vaginitis: NEGATIVE
Chlamydia: NEGATIVE
Comment: NEGATIVE
Comment: NEGATIVE
Comment: NEGATIVE
Comment: NEGATIVE
Comment: NEGATIVE
Comment: NORMAL
Neisseria Gonorrhea: NEGATIVE
Trichomonas: NEGATIVE

## 2021-10-17 ENCOUNTER — Ambulatory Visit: Payer: Medicaid Other

## 2021-10-17 ENCOUNTER — Telehealth: Payer: Self-pay

## 2021-10-17 ENCOUNTER — Other Ambulatory Visit: Payer: Self-pay | Admitting: Family Medicine

## 2021-10-17 DIAGNOSIS — E559 Vitamin D deficiency, unspecified: Secondary | ICD-10-CM

## 2021-10-17 DIAGNOSIS — R5383 Other fatigue: Secondary | ICD-10-CM

## 2021-10-17 MED ORDER — VITAMIN D (ERGOCALCIFEROL) 1.25 MG (50000 UNIT) PO CAPS
50000.0000 [IU] | ORAL_CAPSULE | ORAL | 0 refills | Status: DC
Start: 2021-10-17 — End: 2023-09-17

## 2021-10-17 NOTE — Telephone Encounter (Signed)
During a result note call, pt states Phentermine was discussed during her recent OV. States she was told by [facility] in [place] that medication would be affordable for her & request that prescription be placed to [facility] in [place] for Phentermine.   Pt also notified that she does not need to continue B12 injections; can start OTC B12 daily. Pt verb understanding.

## 2021-10-17 NOTE — Telephone Encounter (Signed)
Result note written on 01/15/21: Vitamin b12 low normal at 298.  For some people this can cause numbness, tingling, and fatigue.  Consider a monthly B12 injection x 3 months vs over the counter b12 supplement daily.  The injections can be set up as a nurse visit in clinic.  Last B12 injection 03/19/21. Last B12 lab on 10/15/21 was 414

## 2021-10-17 NOTE — Telephone Encounter (Signed)
Per PCP, pt no longer needs B12 injections. Can take OTC vitamin b12 daily.

## 2021-10-24 NOTE — Telephone Encounter (Signed)
Patient called in to check the status of this request.   Please advise.

## 2021-10-24 NOTE — Telephone Encounter (Signed)
Message sent to Pcp

## 2021-10-27 NOTE — Telephone Encounter (Signed)
Patient called to get update on  Phentermine  Patient states the pharmacy does not have it and would like a call back.   Good callback number is [phone]      Please send to [facility] in [place]     Please advise

## 2021-10-27 NOTE — Telephone Encounter (Signed)
Notes from OV 10/15/21:  Morbid obesity (HCC)  -Discussed importance of lifestyle modifications, however exercise difficult 2/2 foot pain. -on Ozempic, without wt loss. -Patient interested in starting phentermine.

## 2021-11-13 NOTE — Telephone Encounter (Signed)
Ok to send pt to wt management. ?

## 2021-11-13 NOTE — Telephone Encounter (Signed)
Patient would like a phone call back because she's been waiting for almost a month on an answer regarding this Phentermine medication. ? ?Please advise. ?

## 2021-11-13 NOTE — Addendum Note (Signed)
Addended by: Anderson [place] on: 11/13/2021 02:51 PM ? ? Modules accepted: Orders ? ?

## 2021-11-13 NOTE — Telephone Encounter (Signed)
Referral placed for Wt. Management. ?

## 2021-11-21 ENCOUNTER — Telehealth: Payer: Self-pay | Admitting: Family Medicine

## 2021-11-21 NOTE — Telephone Encounter (Signed)
A referral to wt management was placed. ?

## 2021-11-21 NOTE — Telephone Encounter (Signed)
Pt is calling and had an appt on 10-15-2021 with dr banks and has check with [facility] and would like a new rx phentamine send to  ?[facility] [address], [place] - [place] [address] Phone:  [phone]  ?Fax:  [phone]  ?  ?Pt said she called 6 times ?

## 2021-11-27 ENCOUNTER — Telehealth: Payer: Self-pay | Admitting: Family Medicine

## 2021-11-27 NOTE — Telephone Encounter (Signed)
Patient called to follow up on prescription for phentermine. I let patient know that all messages had been sent back and Dr.Banks only responded with "referral had been placed for weight management". Patient requested the number for weight management and I gave her the number for [facility] on [address] in [place] as she did not have a office listed on the referral. Patient would like a callback to discuss referral (as she was unaware it was even placed) and next steps, as well as how situation was handled. ? ? ? ? ?Please advise  ?

## 2021-11-28 NOTE — Telephone Encounter (Signed)
Spoke with pt, informed her that pcp did not send in medication, but placed referral for wt management. Patient was understanding, asked would they be calling her, I informed her that they would call her to schedule an appointment.  ?

## 2021-12-01 ENCOUNTER — Telehealth: Payer: Self-pay

## 2021-12-01 ENCOUNTER — Other Ambulatory Visit: Payer: Self-pay | Admitting: Endocrinology

## 2021-12-01 DIAGNOSIS — E1165 Type 2 diabetes mellitus with hyperglycemia: Secondary | ICD-10-CM

## 2021-12-01 DIAGNOSIS — E063 Autoimmune thyroiditis: Secondary | ICD-10-CM

## 2021-12-01 DIAGNOSIS — E782 Mixed hyperlipidemia: Secondary | ICD-10-CM

## 2021-12-01 NOTE — Telephone Encounter (Signed)
Patient came into the office to make an appt. Patient was schd for 12/09/2021. Patient last was seen on 03/17/2021. Thinking the patient is going to needs to have labs done. She wants to get them done at [facility]  ? ? ?If labs need to be done could we put some orders in  ? ? ? ?Please advise  ?

## 2021-12-22 ENCOUNTER — Ambulatory Visit
Admission: EM | Admit: 2021-12-22 | Discharge: 2021-12-22 | Disposition: A | Discharge status: Home / Self Care | Payer: Medicaid Other | Attending: Family Medicine | Admitting: Family Medicine

## 2021-12-22 DIAGNOSIS — J069 Acute upper respiratory infection, unspecified: Secondary | ICD-10-CM

## 2021-12-22 DIAGNOSIS — J4521 Mild intermittent asthma with (acute) exacerbation: Secondary | ICD-10-CM

## 2021-12-22 LAB — POCT URINALYSIS DIP (MANUAL ENTRY)
Bilirubin, UA: NEGATIVE
Glucose, UA: NEGATIVE mg/dL
Ketones, POC UA: NEGATIVE mg/dL
Leukocytes, UA: NEGATIVE
Nitrite, UA: NEGATIVE
Protein Ur, POC: NEGATIVE mg/dL
Spec Grav, UA: >=1.03 — AB (ref 1.010–1.025)
Urobilinogen, UA: 0.2 E.U./dL
pH, UA: 5.5 (ref 5.0–8.0)

## 2021-12-22 MED ORDER — ALBUTEROL SULFATE HFA 108 (90 BASE) MCG/ACT IN AERS: 1.0000 | INHALATION_SPRAY | Freq: Four times a day (QID) | RESPIRATORY_TRACT | 0 refills | Status: DC | PRN

## 2021-12-22 MED ORDER — PROMETHAZINE-DM 6.25-15 MG/5ML PO SYRP: 5.0000 mL | ORAL_SOLUTION | Freq: Four times a day (QID) | ORAL | 0 refills | Status: DC | PRN

## 2021-12-22 MED ORDER — PREDNISONE 20 MG PO TABS: 40.0000 mg | ORAL_TABLET | Freq: Every day | ORAL | 0 refills | Status: DC

## 2021-12-22 NOTE — ED Triage Notes (Signed)
Pt presents with c/o cough and nasal congestion for past 3 days. Also reports urinary frequency with some incontinence  ?

## 2021-12-22 NOTE — ED Provider Notes (Signed)
?[facility] ? ? ? ?CSN: [id number] ?Arrival date & time: 12/22/21  1028 ? ? ?  ? ?History   ?Chief Complaint ?Chief Complaint  ?Patient presents with  ? Cough  ? Nasal Congestion  ? Urinary Frequency  ? ? ?HPI ?Amy Lester is a 51 y.o. female.  ? ?Patient presenting today with 3-day history of cough, congestion, chest tightness, wheezing, fatigue, body aches, weakness.  She is also complaining of urinary frequency with some incontinence with coughing spells.  Denies known fever, chest pain, shortness of breath, abdominal pain, nausea vomiting or diarrhea.  Trying multiple antihistamines, Delsym and other over-the-counter remedies with no relief.  No known sick contacts recently.  History of asthma but does not know where her inhaler is. ? ? ?Past Medical History:  ?Diagnosis Date  ? Asthma   ? Blood transfusion without reported diagnosis   ? Chronic migraine   ? Common migraine with intractable migraine 02/16/2019  ? Diabetes mellitus without complication (HCC)   ? Endometriosis   ? Fatty liver   ? GERD (gastroesophageal reflux disease)   ? Hyperlipidemia   ? Hypertension   ? Hypothyroidism   ? Incontinence   ? Irregular heartbeat   ? Multiple sclerosis (HCC)   ? Palpitations   ? Postconcussive syndrome 05/09/2020  ? Pre-diabetes   ? Sleep apnea   ? ? ?Patient Active Problem List  ? Diagnosis Date Noted  ? Hemorrhoids 03/17/2021  ? Fatigue 03/17/2021  ? LLQ abdominal pain 03/17/2021  ? Vitamin D deficiency 01/15/2021  ? Non-intractable vomiting 12/13/2020  ? Flatulence 12/13/2020  ? White matter abnormality on MRI of brain 09/11/2020  ? Hyperlipidemia 08/28/2020  ? Nausea without vomiting 07/25/2020  ? Postconcussive syndrome 05/09/2020  ? Elevated LFTs 04/19/2020  ? Abdominal pain 04/19/2020  ? Dysuria 04/19/2020  ? Class 2 severe obesity due to excess calories with serious comorbidity and body mass index (BMI) of 38.0 to 38.9 in adult [facility]) 04/12/2019  ? Type 2 diabetes mellitus without complication  (HCC) 04/12/2019  ? Palpitations 04/12/2019  ? Mild intermittent asthma without complication 04/12/2019  ? Seasonal allergies 04/12/2019  ? Hypothyroidism 04/12/2019  ? OSA treated with BiPAP 04/12/2019  ? Gastroesophageal reflux disease 04/12/2019  ? Essential hypertension 04/12/2019  ? Intractable chronic migraine without aura 02/16/2019  ? MS (multiple sclerosis) (HCC) 02/16/2019  ? ? ?Past Surgical History:  ?Procedure Laterality Date  ? ABDOMINAL HYSTERECTOMY    ? CESAREAN SECTION    ? x1  ? CHOLECYSTECTOMY    ? COLONOSCOPY WITH PROPOFOL N/A 11/04/2020  ? Surgeon: Corbin Ade, MD; normal exam.  Repeat in 10 years.  ? ENDOMETRIAL ABLATION    ? ESOPHAGOGASTRODUODENOSCOPY (EGD) WITH PROPOFOL N/A 11/04/2020  ? Surgeon: Corbin Ade, MD; normal exam.  ? FOOT SURGERY Left 06/2018  ? foot reconstruction  ? REPLACEMENT TOTAL KNEE Left   ? ? ?OB History   ?No obstetric history on file. ?  ? ? ? ?Home Medications   ? ?Prior to Admission medications   ?Medication Sig Start Date End Date Taking? Authorizing Provider  ?albuterol (VENTOLIN HFA) 108 (90 Base) MCG/ACT inhaler Inhale 1-2 puffs into the lungs every 6 (six) hours as needed for wheezing or shortness of breath. 12/22/21  Yes Particia Nearing, PA-C  ?predniSONE (DELTASONE) 20 MG tablet Take 2 tablets (40 mg total) by mouth daily with breakfast. 12/22/21  Yes Particia Nearing, PA-C  ?promethazine-dextromethorphan (PROMETHAZINE-DM) 6.25-15 MG/5ML syrup Take 5 mLs by  mouth 4 (four) times daily as needed. 12/22/21  Yes Particia Nearing, PA-C  ?acetaminophen (TYLENOL) 500 MG tablet Take 500 mg by mouth as needed for moderate pain.    [provider]  ?aspirin EC 81 MG tablet Take 1 tablet (81 mg total) by mouth daily. Swallow whole. 02/04/21   Netta Neat., NP  ?busPIRone (BUSPAR) 7.5 MG tablet Take 7.5 mg by mouth 2 (two) times daily as needed. 01/07/21   [provider]  ?calcium carbonate (TUMS - DOSED IN MG ELEMENTAL  CALCIUM) 500 MG chewable tablet Chew 2-3 tablets by mouth daily as needed for indigestion or heartburn.    [provider]  ?cetirizine (ZYRTEC) 10 MG tablet Take 1 tablet (10 mg total) by mouth daily. 03/28/19   Jacquelin Hawking, PA-C  ?ciprofloxacin (CILOXAN) 0.3 % ophthalmic solution Place into both eyes daily. 01/07/21   [provider]  ?clotrimazole-betamethasone (LOTRISONE) cream Apply 1 application topically as needed. ?Patient taking differently: Apply 1 application topically as needed (Outbreak). 04/12/19   Deeann Saint, MD  ?Continuous Blood Gluc Sensor (FREESTYLE LIBRE 2 SENSOR) MISC 2 Devices by Does not apply route every 14 (fourteen) days. 03/17/21   Reather Littler, MD  ?Cyanocobalamin (B-12 COMPLIANCE INJECTION IJ) Inject 1 Dose as directed every 30 (thirty) days.    [provider]  ?diclofenac (CATAFLAM) 50 MG tablet Take 1 tablet (50 mg total) by mouth 2 (two) times daily. 03/13/21   Vickki Hearing, MD  ?Dorise Hiss (AIMOVIG, 140 MG DOSE,) 70 MG/ML SOAJ Inject 140 mg into the skin every 30 (thirty) days. ?Patient taking differently: Inject 140 mg into the skin every 28 (twenty-eight) days. 05/09/20   York Spaniel, MD  ?escitalopram (LEXAPRO) 20 MG tablet Take 1 tablet by mouth daily. 01/07/21   [provider]  ?estradiol (EVAMIST) 1.53 MG/SPRAY transdermal spray Place 3 sprays onto the skin daily. ?Patient not taking: Reported on 09/30/2021    [provider]  ?Evolocumab (REPATHA SURECLICK) 140 MG/ML SOAJ Inject 140 mg into the skin every 14 (fourteen) days.    [provider]  ?ezetimibe (ZETIA) 10 MG tablet Take 1 tablet (10 mg total) by mouth daily. 01/13/21   Deeann Saint, MD  ?glipiZIDE (GLUCOTROL) 10 MG tablet Take 10 mg by mouth daily.  03/22/20   [provider]  ?hydrocortisone (ANUSOL-HC) 2.5 % rectal cream Place 1 application rectally 2 (two) times daily. 03/31/21   Letta Median, PA-C  ?ibuprofen (ADVIL) 200 MG tablet  Take 600-800 mg by mouth as needed for mild pain or moderate pain.    [provider]  ?insulin degludec (TRESIBA FLEXTOUCH) 100 UNIT/ML FlexTouch Pen Inject 20 units under the skin twice daily. ?Patient taking differently: Inject 20 Units into the skin 2 (two) times daily. 05/10/20   Reather Littler, MD  ?insulin lispro (HUMALOG KWIKPEN) 100 UNIT/ML KwikPen Inject 15 Units into the skin 3 (three) times daily. 03/17/21   Reather Littler, MD  ?isosorbide mononitrate (IMDUR) 30 MG 24 hr tablet Take 0.5 tablets (15 mg total) by mouth daily. 03/18/21   Netta Neat., NP  ?levothyroxine (SYNTHROID) 100 MCG tablet Take 100 mcg by mouth See admin instructions. Take with the to equal 225 mcg, 6 days per week. Skip Sundays    [provider]  ?lisinopril (ZESTRIL) 5 MG tablet Take 1 tablet (5 mg total) by mouth daily. 08/21/19 07/30/21  Antoine Poche, MD  ?MAGNESIUM OXIDE PO Take 1 tablet  by mouth daily.    [provider]  ?MELATONIN PO Take 1 capsule by mouth at bedtime as needed (sleep).    [provider]  ?metFORMIN (GLUCOPHAGE) 1000 MG tablet Take 1,000 mg by mouth 2 (two) times daily. 03/19/20   [provider]  ?metoprolol tartrate (LOPRESSOR) 25 MG tablet Take 1 tablet (25 mg total) by mouth in the morning. 02/04/21   Netta Neat., NP  ?metoprolol tartrate (LOPRESSOR) 50 MG tablet Take 1 tablet (50 mg total) by mouth at bedtime. 02/04/21   Netta Neat., NP  ?metroNIDAZOLE (METROGEL) 1 % gel Apply 1 application topically at bedtime.     [provider]  ?montelukast (SINGULAIR) 10 MG tablet Take 10 mg by mouth every morning.    [provider]  ?Multiple Vitamins-Minerals (MULTIVITAMIN WITH MINERALS) tablet Take 1 tablet by mouth daily.    [provider]  ?nitroGLYCERIN (NITROSTAT) 0.4 MG SL tablet Place 1 tablet (0.4 mg total) under the tongue every 5 (five) minutes as needed for chest pain. 02/04/21   Netta Neat., NP   ?nystatin cream (MYCOSTATIN) Apply 1 application topically daily as needed for dry skin.    [provider]  ?Olopatadine HCl (PAZEO OP) Place 1 drop into both eyes daily.    [provider]

## 2021-12-24 LAB — COVID-19, FLU A+B NAA
Influenza A, NAA: NOT DETECTED
Influenza B, NAA: NOT DETECTED
SARS-CoV-2, NAA: NOT DETECTED

## 2021-12-29 ENCOUNTER — Ambulatory Visit: Payer: Medicaid Other | Admitting: Endocrinology

## 2022-05-12 ENCOUNTER — Telehealth: Payer: Self-pay

## 2022-05-12 NOTE — Telephone Encounter (Signed)
---  Caller states she is concerned she has a UTI. Reports dysuria and lower back pain. Denies fever.  05/11/2022 4:41:16 PM See HCP within 4 Hours (or PCP triage) Elray Buba, RN, [place]  Comments User: Demetria Pore, RN Date/Time Lamount Cohen Time): 05/11/2022 4:41:43 PM Caller is requesting a follow up appointment with the office tomorrow as well.  Referrals GO TO FACILITY UNDECIDED  05/12/22 1109 - LVM instructions for pt to call office if in office appt needed.

## 2022-05-20 ENCOUNTER — Ambulatory Visit (INDEPENDENT_AMBULATORY_CARE_PROVIDER_SITE_OTHER): Payer: Self-pay | Admitting: Gastroenterology

## 2022-05-20 ENCOUNTER — Encounter: Payer: Self-pay | Admitting: Gastroenterology

## 2022-05-20 VITALS — BP 128/85 | HR 83 | Temp 98.1°F | Ht 64.0 in | Wt 193.5 lb

## 2022-05-20 DIAGNOSIS — K219 Gastro-esophageal reflux disease without esophagitis: Secondary | ICD-10-CM

## 2022-05-20 DIAGNOSIS — K645 Perianal venous thrombosis: Secondary | ICD-10-CM

## 2022-05-20 NOTE — Patient Instructions (Addendum)
I have called in a special cream to [place]. They will be calling you when it is ready.   Place this on rectum 4 times per day. I have included information to help with supportive care as well.   Take 1 stool softener twice a day. Avoid straining. Limit toilet time to 2-3 minutes.  You are at the worst of the pain right now, and it should slowly start getting better!  I would like to see you in 2 weeks!  It was a pleasure to see you today. I want to create trusting relationships with patients to provide genuine, compassionate, and quality care. I value your feedback. If you receive a survey regarding your visit,  I greatly appreciate you taking time to fill this out.   Gelene Mink, PhD, ANP-BC [facility]

## 2022-05-20 NOTE — Progress Notes (Signed)
Gastroenterology Office Note     Primary Care Physician:  Deeann Saint, MD  Primary Gastroenterologist: Dr. Jena Gauss    Chief Complaint   Chief Complaint  Patient presents with   Hemorrhoids    Patient having issues with hemorrhoid bleeding and pain. Need refill on omeprazole.      History of Present Illness   Amy Lester is a 51 y.o. female presenting today in follow-up with a history of hepatic steatosis, GERD, now with rectal bleeding. EGD and colonoscopy on file from February 2022 with both procedures entirely normal.  Due for repeat colonoscopy in 2027   1.5 weeks ago pain in rectum, swelling, blood like a period. Afraid to have BMs due to pain. Pain even without BM. Pain with defection. Knife like. Taking stool softener.   Past Medical History:  Diagnosis Date   Asthma    Blood transfusion without reported diagnosis    Chronic migraine    Common migraine with intractable migraine 02/16/2019   Diabetes mellitus without complication (HCC)    Endometriosis    Fatty liver    GERD (gastroesophageal reflux disease)    Hyperlipidemia    Hypertension    Hypothyroidism    Incontinence    Irregular heartbeat    Multiple sclerosis (HCC)    Palpitations    Postconcussive syndrome 05/09/2020   Pre-diabetes    Sleep apnea     Past Surgical History:  Procedure Laterality Date   ABDOMINAL HYSTERECTOMY     CESAREAN SECTION     x1   CHOLECYSTECTOMY     COLONOSCOPY WITH PROPOFOL N/A 11/04/2020   Surgeon: Corbin Ade, MD; normal exam.  Repeat in 10 years.   ENDOMETRIAL ABLATION     ESOPHAGOGASTRODUODENOSCOPY (EGD) WITH PROPOFOL N/A 11/04/2020   Surgeon: Corbin Ade, MD; normal exam.   FOOT SURGERY Left 06/2018   foot reconstruction   REPLACEMENT TOTAL KNEE Left     Current Outpatient Medications  Medication Sig Dispense Refill   acetaminophen (TYLENOL) 500 MG tablet Take 500 mg by mouth as needed for moderate pain.     albuterol (VENTOLIN HFA) 108  (90 Base) MCG/ACT inhaler Inhale 1-2 puffs into the lungs every 6 (six) hours as needed for wheezing or shortness of breath. 18 g 0   aspirin EC 81 MG tablet Take 1 tablet (81 mg total) by mouth daily. Swallow whole.     busPIRone (BUSPAR) 7.5 MG tablet Take 7.5 mg by mouth 2 (two) times daily as needed.     calcium carbonate (TUMS - DOSED IN MG ELEMENTAL CALCIUM) 500 MG chewable tablet Chew 2-3 tablets by mouth daily as needed for indigestion or heartburn.     cetirizine (ZYRTEC) 10 MG tablet Take 1 tablet (10 mg total) by mouth daily. 90 tablet 0   ciprofloxacin (CILOXAN) 0.3 % ophthalmic solution Place into both eyes daily.     clotrimazole-betamethasone (LOTRISONE) cream Apply 1 application topically as needed. (Patient taking differently: Apply 1 application  topically as needed (Outbreak).) 45 g 2   Continuous Blood Gluc Sensor (FREESTYLE LIBRE 2 SENSOR) MISC 2 Devices by Does not apply route every 14 (fourteen) days. 2 each 3   Cyanocobalamin (B-12 COMPLIANCE INJECTION IJ) Inject 1 Dose as directed every 30 (thirty) days.     diclofenac (CATAFLAM) 50 MG tablet Take 1 tablet (50 mg total) by mouth 2 (two) times daily. 90 tablet 3   Erenumab-aooe (AIMOVIG, 140 MG DOSE,) 70 MG/ML SOAJ Inject 140  mg into the skin every 30 (thirty) days. (Patient taking differently: Inject 140 mg into the skin every 28 (twenty-eight) days.) 1.12 mL 5   escitalopram (LEXAPRO) 20 MG tablet Take 1 tablet by mouth daily.     ezetimibe (ZETIA) 10 MG tablet Take 1 tablet (10 mg total) by mouth daily. 90 tablet 3   glipiZIDE (GLUCOTROL) 10 MG tablet Take 10 mg by mouth daily.      ibuprofen (ADVIL) 200 MG tablet Take 600-800 mg by mouth as needed for mild pain or moderate pain.     insulin degludec (TRESIBA FLEXTOUCH) 100 UNIT/ML FlexTouch Pen Inject 20 units under the skin twice daily. 15 mL 0   insulin lispro (HUMALOG KWIKPEN) 100 UNIT/ML KwikPen Inject 15 Units into the skin 3 (three) times daily. 15 mL 5    levothyroxine (SYNTHROID) 100 MCG tablet Take 100 mcg by mouth See admin instructions. Take with the to equal 225 mcg, 6 days per week. Skip Sundays     lisinopril (ZESTRIL) 5 MG tablet Take 1 tablet (5 mg total) by mouth daily. 90 tablet 1   MAGNESIUM OXIDE PO Take 1 tablet by mouth daily.     MELATONIN PO Take 1 capsule by mouth at bedtime as needed (sleep).     metFORMIN (GLUCOPHAGE) 1000 MG tablet Take 1,000 mg by mouth 2 (two) times daily.     metoprolol tartrate (LOPRESSOR) 25 MG tablet Take 1 tablet (25 mg total) by mouth in the morning. 90 tablet 3   metoprolol tartrate (LOPRESSOR) 50 MG tablet Take 1 tablet (50 mg total) by mouth at bedtime. 90 tablet 3   metroNIDAZOLE (METROGEL) 1 % gel Apply 1 application topically at bedtime.      montelukast (SINGULAIR) 10 MG tablet Take 10 mg by mouth every morning.     Multiple Vitamins-Minerals (MULTIVITAMIN WITH MINERALS) tablet Take 1 tablet by mouth daily.     nitroGLYCERIN (NITROSTAT) 0.4 MG SL tablet Place 1 tablet (0.4 mg total) under the tongue every 5 (five) minutes as needed for chest pain. 25 tablet 3   nystatin cream (MYCOSTATIN) Apply 1 application topically daily as needed for dry skin.     Olopatadine HCl (PAZEO OP) Place 1 drop into both eyes daily.     omeprazole (PRILOSEC) 40 MG capsule Take 1 capsule (40 mg total) by mouth 2 (two) times daily before a meal. 60 capsule 3   ondansetron (ZOFRAN) 4 MG tablet Take 1 tablet (4 mg total) by mouth every 8 (eight) hours as needed for nausea or vomiting. (Patient taking differently: Take 4 mg by mouth as needed for nausea or vomiting.) 20 tablet 1   ondansetron (ZOFRAN) 8 MG tablet Take 1 tablet (8 mg total) by mouth every 8 (eight) hours as needed for nausea or vomiting. 20 tablet 1   predniSONE (DELTASONE) 20 MG tablet Take 2 tablets (40 mg total) by mouth daily with breakfast. 10 tablet 0   promethazine-dextromethorphan (PROMETHAZINE-DM) 6.25-15 MG/5ML syrup Take 5 mLs by mouth 4  (four) times daily as needed. 100 mL 0   Red Yeast Rice Extract (RED YEAST RICE PO) Take 1 tablet by mouth daily.      Respiratory Therapy Supplies (CARETOUCH CPAP & BIPAP HOSE) MISC by Does not apply route. CPAP Titration @ 16 for pressure.     Riboflavin (VITAMIN B-2 PO) Take 1 tablet by mouth daily.      rizatriptan (MAXALT) 10 MG tablet Take 1 tablet (10 mg total) by mouth  3 (three) times daily as needed for migraine. (Patient taking differently: Take 10 mg by mouth as needed for migraine.) 10 tablet 5   Semaglutide, 1 MG/DOSE, (OZEMPIC, 1 MG/DOSE,) 2 MG/1.5ML SOPN Inject 1 mg into the skin once a week. (Patient taking differently: Inject 1 mg into the skin every Sunday.) 4.5 mL 1   SYNTHROID 125 MCG tablet Take two tablets (250mg  total) by mouth days 1-6 and skip day 7. (Patient taking differently: Take 125 mcg by mouth See admin instructions. Take with 100 mcg to equal 225 mcg, 6 days per week. Skip Sundays) 180 tablet 1   Tretinoin, Facial Wrinkles, (RENOVA) 0.02 % CREA Apply 1 application topically at bedtime.     triamcinolone cream (KENALOG) 0.1 % Apply 1 application topically 2 (two) times daily as needed. (Patient taking differently: Apply 1 application  topically 2 (two) times daily as needed (rash).) 45 g 0   triamterene-hydrochlorothiazide (MAXZIDE-25) 37.5-25 MG tablet Take 1 tablet by mouth daily. 90 tablet 3   venlafaxine XR (EFFEXOR XR) 75 MG 24 hr capsule Take 1 capsule (75 mg total) by mouth daily with breakfast. 30 capsule 5   Vitamin D, Ergocalciferol, (DRISDOL) 1.25 MG (50000 UNIT) CAPS capsule Take 1 capsule (50,000 Units total) by mouth every 7 (seven) days. 12 capsule 0   zonisamide (ZONEGRAN) 100 MG capsule Take 1 capsule daily 30 capsule 5   estradiol (EVAMIST) 1.53 MG/SPRAY transdermal spray Place 3 sprays onto the skin daily. (Patient not taking: Reported on 09/30/2021)     Evolocumab (REPATHA SURECLICK) 140 MG/ML SOAJ Inject 140 mg into the skin every 14 (fourteen) days.  (Patient not taking: Reported on 05/20/2022)     hydrocortisone (ANUSOL-HC) 2.5 % rectal cream Place 1 application rectally 2 (two) times daily. (Patient not taking: Reported on 05/20/2022) 30 g 1   isosorbide mononitrate (IMDUR) 30 MG 24 hr tablet Take 0.5 tablets (15 mg total) by mouth daily. (Patient not taking: Reported on 05/20/2022) 15 tablet 6   No current facility-administered medications for this visit.    Allergies as of 05/20/2022 - Review Complete 05/20/2022  Allergen Reaction Noted   Crestor [rosuvastatin]  08/27/2020   Lipitor [atorvastatin]  08/27/2020   Percocet [oxycodone-acetaminophen] Nausea And Vomiting 11/24/2018   Zocor [simvastatin]  08/27/2020    Family History  Problem Relation Age of Onset   Heart attack Mother    Stroke Mother    Cancer Mother        breast cancer   Hyperlipidemia Mother    Hypothyroidism Mother    Cancer Father        lymph nodes, liver cancer   Colon cancer Father        early 10s   Other Daughter        Fine Gold Type II Syndrome and cognetive issues   Memory loss Daughter    Other Son        Fine Gold Syndrome and BPES   Diabetes Son 22       IDDM   Hypothyroidism Sister    Cancer Sister    Hyperthyroidism Neg Hx     Social History   Socioeconomic History   Marital status: Married    Spouse name: Not on file   Number of children: Not on file   Years of education: 14   Highest education level: Not on file  Occupational History   Not on file  Tobacco Use   Smoking status: Never    Passive exposure: Past  Smokeless tobacco: Never  Vaping Use   Vaping Use: Never used  Substance and Sexual Activity   Alcohol use: Yes    Comment: once a year   Drug use: Never   Sexual activity: Not on file  Other Topics Concern   Not on file  Social History Narrative   Left handed    Caffeine 1 daily    Lives at home with husband and adult daughter   One story home   Social Determinants of Health   Financial Resource Strain:  Not on file  Food Insecurity: Not on file  Transportation Needs: Not on file  Physical Activity: Not on file  Stress: Not on file  Social Connections: Not on file  Intimate Partner Violence: Not on file     Review of Systems   Gen: Denies any fever, chills, fatigue, weight loss, lack of appetite.  CV: Denies chest pain, heart palpitations, peripheral edema, syncope.  Resp: Denies shortness of breath at rest or with exertion. Denies wheezing or cough.  GI: Denies dysphagia or odynophagia. Denies jaundice, hematemesis, fecal incontinence. GU : Denies urinary burning, urinary frequency, urinary hesitancy MS: Denies joint pain, muscle weakness, cramps, or limitation of movement.  Derm: Denies rash, itching, dry skin Psych: Denies depression, anxiety, memory loss, and confusion Heme: Denies bruising, bleeding, and enlarged lymph nodes.   Physical Exam   BP 128/85 (BP Location: Left Arm, Patient Position: Sitting, Cuff Size: Large)   Pulse 83   Temp 98.1 F (36.7 C) (Oral)   Ht 5\' 4"  (1.626 m)   Wt 193 lb 8 oz (87.8 kg)   BMI 33.21 kg/m  General:   Alert and oriented. Pleasant and cooperative. Well-nourished and well-developed.  Head:  Normocephalic and atraumatic. Eyes:  Without icterus Abdomen:  +BS, soft, non-tender and non-distended. No HSM noted. No guarding or rebound. No masses appreciated.  Rectal:  right posterior resolving thrombosed hemorrhoid, pea-sized, firm, with evidence of resorbing of clot already started, right anterior thrombosed hemorrhoid improving  Msk:  Symmetrical without gross deformities. Normal posture. Extremities:  Without edema. Neurologic:  Alert and  oriented x4;  grossly normal neurologically. Skin:  Intact without significant lesions or rashes. Psych:  Alert and cooperative. Normal mood and affect.   Assessment   Amy Lester is a 51 y.o. female presenting today in follow-up with a history of hepatic steatosis, GERD, now with rectal  bleeding. EGD and colonoscopy on file from February 2022 with both procedures entirely normal.  Due for repeat colonoscopy in 2027.   Clinical exam reveals thrombosed hemorrhoid right posterior, resolving. Evidence of clot resorbing. She is past the window of time to benefit from surgical intervention.    PLAN    Compounded 2028 Apothecary ream QID Stool softener BID Return for close follow-up in 2 weeks   [place], PhD, [facility]

## 2022-05-29 ENCOUNTER — Ambulatory Visit
Admission: EM | Admit: 2022-05-29 | Discharge: 2022-05-29 | Disposition: A | Discharge status: Home / Self Care | Payer: Medicaid Other | Attending: Family Medicine | Admitting: Family Medicine

## 2022-05-29 ENCOUNTER — Encounter: Payer: Self-pay | Admitting: Emergency Medicine

## 2022-05-29 DIAGNOSIS — R5383 Other fatigue: Secondary | ICD-10-CM

## 2022-05-29 DIAGNOSIS — G35 Multiple sclerosis: Secondary | ICD-10-CM | POA: Insufficient documentation

## 2022-05-29 DIAGNOSIS — I10 Essential (primary) hypertension: Secondary | ICD-10-CM | POA: Insufficient documentation

## 2022-05-29 DIAGNOSIS — R52 Pain, unspecified: Secondary | ICD-10-CM

## 2022-05-29 DIAGNOSIS — J069 Acute upper respiratory infection, unspecified: Secondary | ICD-10-CM

## 2022-05-29 DIAGNOSIS — Z79899 Other long term (current) drug therapy: Secondary | ICD-10-CM | POA: Insufficient documentation

## 2022-05-29 DIAGNOSIS — Z7952 Long term (current) use of systemic steroids: Secondary | ICD-10-CM | POA: Insufficient documentation

## 2022-05-29 DIAGNOSIS — Z794 Long term (current) use of insulin: Secondary | ICD-10-CM | POA: Insufficient documentation

## 2022-05-29 DIAGNOSIS — Z20822 Contact with and (suspected) exposure to covid-19: Secondary | ICD-10-CM | POA: Insufficient documentation

## 2022-05-29 DIAGNOSIS — Z7984 Long term (current) use of oral hypoglycemic drugs: Secondary | ICD-10-CM | POA: Insufficient documentation

## 2022-05-29 DIAGNOSIS — J4520 Mild intermittent asthma, uncomplicated: Secondary | ICD-10-CM | POA: Insufficient documentation

## 2022-05-29 DIAGNOSIS — E119 Type 2 diabetes mellitus without complications: Secondary | ICD-10-CM | POA: Insufficient documentation

## 2022-05-29 LAB — RESP PANEL BY RT-PCR (FLU A&B, COVID) ARPGX2
Influenza A by PCR: NEGATIVE
Influenza B by PCR: NEGATIVE
SARS Coronavirus 2 by RT PCR: NEGATIVE

## 2022-05-29 NOTE — ED Provider Notes (Signed)
[facility]    CSN: [id number] Arrival date & time: 05/29/22  1412      History   Chief Complaint No chief complaint on file.   HPI Amy Lester is a 51 y.o. female.   Patient with past medical history of asthma, diabetes, hypertension, MS presenting with 2-day history of fatigue, body aches, chills, headache, right ear pain, mild sinus pressure.  Denies chest pain, shortness of breath, abdominal pain, nausea vomiting or diarrhea.  So far trying over-the-counter pain relievers with minimal relief of symptoms.  Has been sick with similar symptoms currently.  Home COVID test was negative yesterday.    Past Medical History:  Diagnosis Date   Asthma    Blood transfusion without reported diagnosis    Chronic migraine    Common migraine with intractable migraine 02/16/2019   Diabetes mellitus without complication (HCC)    Endometriosis    Fatty liver    GERD (gastroesophageal reflux disease)    Hyperlipidemia    Hypertension    Hypothyroidism    Incontinence    Irregular heartbeat    Multiple sclerosis (HCC)    Palpitations    Postconcussive syndrome 05/09/2020   Pre-diabetes    Sleep apnea     Patient Active Problem List   Diagnosis Date Noted   Thrombosed hemorrhoids 05/20/2022   Hemorrhoids 03/17/2021   Fatigue 03/17/2021   LLQ abdominal pain 03/17/2021   Vitamin D deficiency 01/15/2021   Non-intractable vomiting 12/13/2020   Flatulence 12/13/2020   White matter abnormality on MRI of brain 09/11/2020   Hyperlipidemia 08/28/2020   Nausea without vomiting 07/25/2020   Postconcussive syndrome 05/09/2020   Elevated LFTs 04/19/2020   Abdominal pain 04/19/2020   Dysuria 04/19/2020   Class 2 severe obesity due to excess calories with serious comorbidity and body mass index (BMI) of 38.0 to 38.9 in adult (HCC) 04/12/2019   Type 2 diabetes mellitus without complication (HCC) 04/12/2019   Palpitations 04/12/2019   Mild intermittent asthma without  complication 04/12/2019   Seasonal allergies 04/12/2019   Hypothyroidism 04/12/2019   OSA treated with BiPAP 04/12/2019   Gastroesophageal reflux disease 04/12/2019   Essential hypertension 04/12/2019   Intractable chronic migraine without aura 02/16/2019   MS (multiple sclerosis) (HCC) 02/16/2019    Past Surgical History:  Procedure Laterality Date   ABDOMINAL HYSTERECTOMY     CESAREAN SECTION     x1   CHOLECYSTECTOMY     COLONOSCOPY WITH PROPOFOL N/A 11/04/2020   Surgeon: Corbin Ade, MD; normal exam.  Repeat in 10 years.   ENDOMETRIAL ABLATION     ESOPHAGOGASTRODUODENOSCOPY (EGD) WITH PROPOFOL N/A 11/04/2020   Surgeon: Corbin Ade, MD; normal exam.   FOOT SURGERY Left 06/2018   foot reconstruction   REPLACEMENT TOTAL KNEE Left     OB History   No obstetric history on file.      Home Medications    Prior to Admission medications   Medication Sig Start Date End Date Taking? Authorizing Provider  acetaminophen (TYLENOL) 500 MG tablet Take 500 mg by mouth as needed for moderate pain.    [provider]  albuterol (VENTOLIN HFA) 108 (90 Base) MCG/ACT inhaler Inhale 1-2 puffs into the lungs every 6 (six) hours as needed for wheezing or shortness of breath. 12/22/21   Particia Nearing, PA-C  aspirin EC 81 MG tablet Take 1 tablet (81 mg total) by mouth daily. Swallow whole. 02/04/21   Netta Neat., NP  busPIRone Orbie Hurst)  7.5 MG tablet Take 7.5 mg by mouth 2 (two) times daily as needed. 01/07/21   [provider]  calcium carbonate (TUMS - DOSED IN MG ELEMENTAL CALCIUM) 500 MG chewable tablet Chew 2-3 tablets by mouth daily as needed for indigestion or heartburn.    [provider]  cetirizine (ZYRTEC) 10 MG tablet Take 1 tablet (10 mg total) by mouth daily. 03/28/19   Soyla Dryer, PA-C  ciprofloxacin (CILOXAN) 0.3 % ophthalmic solution Place into both eyes daily. 01/07/21   [provider]  clotrimazole-betamethasone  (LOTRISONE) cream Apply 1 application topically as needed. Patient taking differently: Apply 1 application  topically as needed (Outbreak). 04/12/19   Billie Ruddy, MD  Continuous Blood Gluc Sensor (FREESTYLE LIBRE 2 SENSOR) MISC 2 Devices by Does not apply route every 14 (fourteen) days. 03/17/21   Elayne Snare, MD  Cyanocobalamin (B-12 COMPLIANCE INJECTION IJ) Inject 1 Dose as directed every 30 (thirty) days.    [provider]  diclofenac (CATAFLAM) 50 MG tablet Take 1 tablet (50 mg total) by mouth 2 (two) times daily. 03/13/21   Carole Civil, MD  Erenumab-aooe (AIMOVIG, 140 MG DOSE,) 70 MG/ML SOAJ Inject 140 mg into the skin every 30 (thirty) days. Patient taking differently: Inject 140 mg into the skin every 28 (twenty-eight) days. 05/09/20   Kathrynn Ducking, MD  escitalopram (LEXAPRO) 20 MG tablet Take 1 tablet by mouth daily. 01/07/21   [provider]  ezetimibe (ZETIA) 10 MG tablet Take 1 tablet (10 mg total) by mouth daily. 01/13/21   Billie Ruddy, MD  glipiZIDE (GLUCOTROL) 10 MG tablet Take 10 mg by mouth daily.  03/22/20   [provider]  ibuprofen (ADVIL) 200 MG tablet Take 600-800 mg by mouth as needed for mild pain or moderate pain.    [provider]  insulin degludec (TRESIBA FLEXTOUCH) 100 UNIT/ML FlexTouch Pen Inject 20 units under the skin twice daily. 05/10/20   Elayne Snare, MD  insulin lispro (HUMALOG KWIKPEN) 100 UNIT/ML KwikPen Inject 15 Units into the skin 3 (three) times daily. 03/17/21   Elayne Snare, MD  levothyroxine (SYNTHROID) 100 MCG tablet Take 100 mcg by mouth See admin instructions. Take with the 136mcg to equal 225 mcg, 6 days per week. Skip Sundays    [provider]  lisinopril (ZESTRIL) 5 MG tablet Take 1 tablet (5 mg total) by mouth daily. 08/21/19 05/20/22  Arnoldo Lenis, MD  MAGNESIUM OXIDE PO Take 1 tablet by mouth daily.    [provider]  MELATONIN PO Take 1 capsule by mouth at bedtime as needed  (sleep).    [provider]  metFORMIN (GLUCOPHAGE) 1000 MG tablet Take 1,000 mg by mouth 2 (two) times daily. 03/19/20   [provider]  metoprolol tartrate (LOPRESSOR) 25 MG tablet Take 1 tablet (25 mg total) by mouth in the morning. 02/04/21   Verta Ellen., NP  metoprolol tartrate (LOPRESSOR) 50 MG tablet Take 1 tablet (50 mg total) by mouth at bedtime. 02/04/21   Verta Ellen., NP  metroNIDAZOLE (METROGEL) 1 % gel Apply 1 application topically at bedtime.     [provider]  montelukast (SINGULAIR) 10 MG tablet Take 10 mg by mouth every morning.    [provider]  Multiple Vitamins-Minerals (MULTIVITAMIN WITH MINERALS) tablet Take 1 tablet by mouth daily.    [provider]  nitroGLYCERIN (NITROSTAT) 0.4 MG SL tablet Place 1 tablet (0.4 mg total) under the tongue  every 5 (five) minutes as needed for chest pain. 02/04/21   Netta Neat., NP  nystatin cream (MYCOSTATIN) Apply 1 application topically daily as needed for dry skin.    [provider]  Olopatadine HCl (PAZEO OP) Place 1 drop into both eyes daily.    [provider]  omeprazole (PRILOSEC) 40 MG capsule Take 1 capsule (40 mg total) by mouth 2 (two) times daily before a meal. 03/31/21   Letta Median, PA-C  ondansetron (ZOFRAN) 4 MG tablet Take 1 tablet (4 mg total) by mouth every 8 (eight) hours as needed for nausea or vomiting. Patient taking differently: Take 4 mg by mouth as needed for nausea or vomiting. 12/13/20   Letta Median, PA-C  ondansetron (ZOFRAN) 8 MG tablet Take 1 tablet (8 mg total) by mouth every 8 (eight) hours as needed for nausea or vomiting. 03/31/21   Letta Median, PA-C  predniSONE (DELTASONE) 20 MG tablet Take 2 tablets (40 mg total) by mouth daily with breakfast. 12/22/21   Particia Nearing, PA-C  promethazine-dextromethorphan (PROMETHAZINE-DM) 6.25-15 MG/5ML syrup Take 5 mLs by mouth 4 (four) times daily as needed.  12/22/21   Particia Nearing, PA-C  Red Yeast Rice Extract (RED YEAST RICE PO) Take 1 tablet by mouth daily.     [provider]  Respiratory Therapy Supplies (CARETOUCH CPAP & BIPAP HOSE) MISC by Does not apply route. CPAP Titration @ 16 for pressure.    [provider]  Riboflavin (VITAMIN B-2 PO) Take 1 tablet by mouth daily.     [provider]  rizatriptan (MAXALT) 10 MG tablet Take 1 tablet (10 mg total) by mouth 3 (three) times daily as needed for migraine. Patient taking differently: Take 10 mg by mouth as needed for migraine. 09/11/20   Glean Salvo, NP  Semaglutide, 1 MG/DOSE, (OZEMPIC, 1 MG/DOSE,) 2 MG/1.5ML SOPN Inject 1 mg into the skin once a week. Patient taking differently: Inject 1 mg into the skin every Sunday. 10/29/20   Reather Littler, MD  SYNTHROID 125 MCG tablet Take two tablets (  total) by mouth days 1-6 and skip day 7. Patient taking differently: Take 125 mcg by mouth See admin instructions. Take with 100 mcg to equal 225 mcg, 6 days per week. Skip Sundays 03/20/20   Reather Littler, MD  Tretinoin, Facial Wrinkles, (RENOVA) 0.02 % CREA Apply 1 application topically at bedtime.    [provider]  triamcinolone cream (KENALOG) 0.1 % Apply 1 application topically 2 (two) times daily as needed. Patient taking differently: Apply 1 application  topically 2 (two) times daily as needed (rash). 03/28/19   Jacquelin Hawking, PA-C  triamterene-hydrochlorothiazide (MAXZIDE-25) 37.5-25 MG tablet Take 1 tablet by mouth daily. 02/04/21   Netta Neat., NP  venlafaxine XR (EFFEXOR XR) 75 MG 24 hr capsule Take 1 capsule (75 mg total) by mouth daily with breakfast. 09/11/20   Glean Salvo, NP  Vitamin D, Ergocalciferol, (DRISDOL) 1.25 MG (50000 UNIT) CAPS capsule Take 1 capsule (50,000 Units total) by mouth every 7 (seven) days. 10/17/21   Deeann Saint, MD  zonisamide (ZONEGRAN) 100 MG capsule Take 1 capsule daily 01/29/21   Drema Dallas, DO     Family History Family History  Problem Relation Age of Onset   Heart attack Mother    Stroke Mother    Cancer Mother        breast cancer   Hyperlipidemia Mother    Hypothyroidism Mother  Cancer Father        lymph nodes, liver cancer   Colon cancer Father        early 82s   Other Daughter        Fine Gold Type II Syndrome and cognetive issues   Memory loss Daughter    Other Son        Fine Gold Syndrome and BPES   Diabetes Son 61       IDDM   Hypothyroidism Sister    Cancer Sister    Hyperthyroidism Neg Hx     Social History Social History   Tobacco Use   Smoking status: Never    Passive exposure: Past   Smokeless tobacco: Never  Vaping Use   Vaping Use: Never used  Substance Use Topics   Alcohol use: Yes    Comment: once a year   Drug use: Never     Allergies   Crestor [rosuvastatin], Lipitor [atorvastatin], Percocet [oxycodone-acetaminophen], and Zocor [simvastatin]   Review of Systems Review of Systems Per HPI  Physical Exam Triage Vital Signs ED Triage Vitals  Enc Vitals Group     BP 05/29/22 1449 (!) 143/90     Pulse Rate 05/29/22 1449 84     Resp 05/29/22 1449 16     Temp 05/29/22 1449 98.2 F (36.8 C)     Temp src --      SpO2 05/29/22 1449 97 %     Weight --      Height --      Head Circumference --      Peak Flow --      Pain Score 05/29/22 1450 3     Pain Loc --      Pain Edu? --      Excl. in GC? --    No data found.  Updated Vital Signs BP (!) 143/90 (BP Location: Right Arm)   Pulse 84   Temp 98.2 F (36.8 C)   Resp 16   SpO2 97%   Visual Acuity Right Eye Distance:   Left Eye Distance:   Bilateral Distance:    Right Eye Near:   Left Eye Near:    Bilateral Near:     Physical Exam Vitals and nursing note reviewed.  Constitutional:      Appearance: Normal appearance.  HENT:     Head: Atraumatic.     Right Ear: Tympanic membrane and external ear normal.     Left Ear: Tympanic membrane and external ear  normal.     Nose: Nose normal.     Mouth/Throat:     Mouth: Mucous membranes are moist.     Pharynx: No oropharyngeal exudate or posterior oropharyngeal erythema.  Eyes:     Extraocular Movements: Extraocular movements intact.     Conjunctiva/sclera: Conjunctivae normal.  Cardiovascular:     Rate and Rhythm: Normal rate and regular rhythm.     Heart sounds: Normal heart sounds.  Pulmonary:     Effort: Pulmonary effort is normal.     Breath sounds: Normal breath sounds. No wheezing or rales.  Musculoskeletal:        General: Normal range of motion.     Cervical back: Normal range of motion and neck supple.  Skin:    General: Skin is warm and dry.  Neurological:     Mental Status: She is alert and oriented to person, place, and time.     Motor: No weakness.     Gait: Gait normal.  Psychiatric:        Mood and Affect: Mood normal.        Thought Content: Thought content normal.      UC Treatments / Results  Labs (all labs ordered are listed, but only abnormal results are displayed) Labs Reviewed  RESP PANEL BY RT-PCR (FLU A&B, COVID) [id number]    EKG   Radiology No results found.  Procedures Procedures (including critical care time)  Medications Ordered in UC Medications - No data to display  Initial Impression / Assessment and Plan / UC Course  I have reviewed the triage vital signs and the nursing notes.  Pertinent labs & imaging results that were available during my care of the patient were reviewed by me and considered in my medical decision making (see chart for details).     Suspect new viral respiratory infection, respiratory panel pending, discussed supportive over-the-counter medications and home care for symptoms.  Return for any worsening symptoms.  Final Clinical Impressions(s) / UC Diagnoses   Final diagnoses:  Viral URI  Other fatigue  Generalized body aches   Discharge Instructions   None    ED Prescriptions   None    PDMP not reviewed  this encounter.   Particia Nearing, [place] 05/29/22 1842

## 2022-05-29 NOTE — ED Triage Notes (Signed)
Feels fatigued and body aches, with headache, right ear pain x 2 days.  At home covid test yesterday was negative.

## 2022-06-11 ENCOUNTER — Ambulatory Visit (INDEPENDENT_AMBULATORY_CARE_PROVIDER_SITE_OTHER): Payer: Self-pay | Admitting: Gastroenterology

## 2022-06-11 ENCOUNTER — Encounter: Payer: Self-pay | Admitting: Gastroenterology

## 2022-06-11 VITALS — BP 144/87 | HR 76 | Temp 98.1°F | Ht 63.0 in | Wt 195.6 lb

## 2022-06-11 DIAGNOSIS — K6289 Other specified diseases of anus and rectum: Secondary | ICD-10-CM

## 2022-06-11 NOTE — Progress Notes (Signed)
Gastroenterology Office Note     Primary Care Physician:  Billie Ruddy, MD  Primary Gastroenterologist: Dr. Gala Romney    Chief Complaint   Chief Complaint  Patient presents with   Hemorrhoids    Pt here for a banding but wants to discuss with Dr first     History of Present Illness   Amy Lester is a 51 y.o. female presenting today in follow-up with a history of thrombosed hemorrhoids. She was seen 05/20/22 due to painful hemorrhoids and was past the window of time for benefits of surgical intervention. Returns today in close follow-up.    Pain has now resolved. No further thrombosed hemorrhoids. However, she has rectal pain with BM. Hurts for a minute after BM then improved. Wants to scream when having BM. No pain currently. No further rectal bleeding. BM usually every 2 days, which is her normal baseline. Softer to harder stool. Doesn't want to take a stool softener. Benefiber makes her stool harder. Hasn't done [facility] cream.        Past Medical History:  Diagnosis Date   Asthma    Blood transfusion without reported diagnosis    Chronic migraine    Common migraine with intractable migraine 02/16/2019   Diabetes mellitus without complication (HCC)    Endometriosis    Fatty liver    GERD (gastroesophageal reflux disease)    Hyperlipidemia    Hypertension    Hypothyroidism    Incontinence    Irregular heartbeat    Multiple sclerosis ([place])    Palpitations    Postconcussive syndrome 05/09/2020   Pre-diabetes    Sleep apnea     Past Surgical History:  Procedure Laterality Date   ABDOMINAL HYSTERECTOMY     CESAREAN SECTION     x1   CHOLECYSTECTOMY     COLONOSCOPY WITH PROPOFOL N/A 11/04/2020   Surgeon: Daneil Dolin, MD; normal exam.  Repeat in 10 years.   ENDOMETRIAL ABLATION     ESOPHAGOGASTRODUODENOSCOPY (EGD) WITH PROPOFOL N/A 11/04/2020   Surgeon: Daneil Dolin, MD; normal exam.   FOOT SURGERY Left 06/2018   foot reconstruction    REPLACEMENT TOTAL KNEE Left     Current Outpatient Medications  Medication Sig Dispense Refill   acetaminophen (TYLENOL) 500 MG tablet Take 500 mg by mouth as needed for moderate pain.     albuterol (VENTOLIN HFA) 108 (90 Base) MCG/ACT inhaler Inhale 1-2 puffs into the lungs every 6 (six) hours as needed for wheezing or shortness of breath. 18 g 0   aspirin EC 81 MG tablet Take 1 tablet (81 mg total) by mouth daily. Swallow whole.     busPIRone (BUSPAR) 7.5 MG tablet Take 7.5 mg by mouth 2 (two) times daily as needed.     calcium carbonate (TUMS - DOSED IN MG ELEMENTAL CALCIUM) 500 MG chewable tablet Chew 2-3 tablets by mouth daily as needed for indigestion or heartburn.     cetirizine (ZYRTEC) 10 MG tablet Take 1 tablet (10 mg total) by mouth daily. 90 tablet 0   ciprofloxacin (CILOXAN) 0.3 % ophthalmic solution Place into both eyes daily.     clotrimazole-betamethasone (LOTRISONE) cream Apply 1 application topically as needed. (Patient taking differently: Apply 1 application  topically as needed (Outbreak).) 45 g 2   Continuous Blood Gluc Sensor (FREESTYLE LIBRE 2 SENSOR) MISC 2 Devices by Does not apply route every 14 (fourteen) days. 2 each 3   Cyanocobalamin (B-12 COMPLIANCE INJECTION IJ) Inject 1  Dose as directed every 30 (thirty) days.     Erenumab-aooe (AIMOVIG, 140 MG DOSE,) 70 MG/ML SOAJ Inject 140 mg into the skin every 30 (thirty) days. (Patient taking differently: Inject 140 mg into the skin every 28 (twenty-eight) days.) 1.12 mL 5   escitalopram (LEXAPRO) 20 MG tablet Take 1 tablet by mouth daily.     ezetimibe (ZETIA) 10 MG tablet Take 1 tablet (10 mg total) by mouth daily. 90 tablet 3   glipiZIDE (GLUCOTROL) 10 MG tablet Take 10 mg by mouth daily.      ibuprofen (ADVIL) 200 MG tablet Take 600-800 mg by mouth as needed for mild pain or moderate pain.     insulin degludec (TRESIBA FLEXTOUCH) 100 UNIT/ML FlexTouch Pen Inject 20 units under the skin twice daily. 15 mL 0    levothyroxine (SYNTHROID) 100 MCG tablet Take 100 mcg by mouth See admin instructions. Take with the 155mcg to equal 225 mcg, 6 days per week. Skip Sundays     MAGNESIUM OXIDE PO Take 1 tablet by mouth daily.     MELATONIN PO Take 1 capsule by mouth at bedtime as needed (sleep).     metFORMIN (GLUCOPHAGE) 1000 MG tablet Take 1,000 mg by mouth 2 (two) times daily.     metoprolol tartrate (LOPRESSOR) 25 MG tablet Take 1 tablet (25 mg total) by mouth in the morning. 90 tablet 3   metoprolol tartrate (LOPRESSOR) 50 MG tablet Take 1 tablet (50 mg total) by mouth at bedtime. 90 tablet 3   metroNIDAZOLE (METROGEL) 1 % gel Apply 1 application topically at bedtime.      montelukast (SINGULAIR) 10 MG tablet Take 10 mg by mouth every morning.     Multiple Vitamins-Minerals (MULTIVITAMIN WITH MINERALS) tablet Take 1 tablet by mouth daily.     nitroGLYCERIN (NITROSTAT) 0.4 MG SL tablet Place 1 tablet (0.4 mg total) under the tongue every 5 (five) minutes as needed for chest pain. 25 tablet 3   nystatin cream (MYCOSTATIN) Apply 1 application topically daily as needed for dry skin.     Olopatadine HCl (PAZEO OP) Place 1 drop into both eyes daily.     omeprazole (PRILOSEC) 40 MG capsule Take 1 capsule (40 mg total) by mouth 2 (two) times daily before a meal. 60 capsule 3   ondansetron (ZOFRAN) 4 MG tablet Take 1 tablet (4 mg total) by mouth every 8 (eight) hours as needed for nausea or vomiting. (Patient taking differently: Take 4 mg by mouth as needed for nausea or vomiting.) 20 tablet 1   ondansetron (ZOFRAN) 8 MG tablet Take 1 tablet (8 mg total) by mouth every 8 (eight) hours as needed for nausea or vomiting. 20 tablet 1   predniSONE (DELTASONE) 20 MG tablet Take 2 tablets (40 mg total) by mouth daily with breakfast. 10 tablet 0   Red Yeast Rice Extract (RED YEAST RICE PO) Take 1 tablet by mouth daily.      Respiratory Therapy Supplies (CARETOUCH CPAP & BIPAP HOSE) MISC by Does not apply route. CPAP Titration @  16 for pressure.     Riboflavin (VITAMIN B-2 PO) Take 1 tablet by mouth daily.      rizatriptan (MAXALT) 10 MG tablet Take 1 tablet (10 mg total) by mouth 3 (three) times daily as needed for migraine. (Patient taking differently: Take 10 mg by mouth as needed for migraine.) 10 tablet 5   Semaglutide, 1 MG/DOSE, (OZEMPIC, 1 MG/DOSE,) 2 MG/1.5ML SOPN Inject 1 mg into the skin once  a week. (Patient taking differently: Inject 1 mg into the skin every Sunday.) 4.5 mL 1   SYNTHROID 125 MCG tablet Take two tablets (250mg  total) by mouth days 1-6 and skip day 7. (Patient taking differently: Take 125 mcg by mouth See admin instructions. Take with 100 mcg to equal 225 mcg, 6 days per week. Skip Sundays) 180 tablet 1   Tretinoin, Facial Wrinkles, (RENOVA) 0.02 % CREA Apply 1 application topically at bedtime.     triamcinolone cream (KENALOG) 0.1 % Apply 1 application topically 2 (two) times daily as needed. (Patient taking differently: Apply 1 application  topically 2 (two) times daily as needed (rash).) 45 g 0   triamterene-hydrochlorothiazide (MAXZIDE-25) 37.5-25 MG tablet Take 1 tablet by mouth daily. 90 tablet 3   venlafaxine XR (EFFEXOR XR) 75 MG 24 hr capsule Take 1 capsule (75 mg total) by mouth daily with breakfast. 30 capsule 5   Vitamin D, Ergocalciferol, (DRISDOL) 1.25 MG (50000 UNIT) CAPS capsule Take 1 capsule (50,000 Units total) by mouth every 7 (seven) days. 12 capsule 0   zonisamide (ZONEGRAN) 100 MG capsule Take 1 capsule daily 30 capsule 5   lisinopril (ZESTRIL) 5 MG tablet Take 1 tablet (5 mg total) by mouth daily. 90 tablet 1   promethazine-dextromethorphan (PROMETHAZINE-DM) 6.25-15 MG/5ML syrup Take 5 mLs by mouth 4 (four) times daily as needed. 100 mL 0   No current facility-administered medications for this visit.    Allergies as of 06/11/2022 - Review Complete 06/11/2022  Allergen Reaction Noted   Crestor [rosuvastatin]  08/27/2020   Lipitor [atorvastatin]  08/27/2020   Percocet  [oxycodone-acetaminophen] Nausea And Vomiting 11/24/2018   Zocor [simvastatin]  08/27/2020    Family History  Problem Relation Age of Onset   Heart attack Mother    Stroke Mother    Cancer Mother        breast cancer   Hyperlipidemia Mother    Hypothyroidism Mother    Cancer Father        lymph nodes, liver cancer   Colon cancer Father        early 1s   Other Daughter        Fine Gold Type II Syndrome and cognetive issues   Memory loss Daughter    Other Son        Fine Gold Syndrome and BPES   Diabetes Son 29       IDDM   Hypothyroidism Sister    Cancer Sister    Hyperthyroidism Neg Hx     Social History   Socioeconomic History   Marital status: Married    Spouse name: Not on file   Number of children: Not on file   Years of education: 14   Highest education level: Not on file  Occupational History   Not on file  Tobacco Use   Smoking status: Never    Passive exposure: Past   Smokeless tobacco: Never  Vaping Use   Vaping Use: Never used  Substance and Sexual Activity   Alcohol use: Yes    Comment: once a year   Drug use: Never   Sexual activity: Not on file  Other Topics Concern   Not on file  Social History Narrative   Left handed    Caffeine 1 daily    Lives at home with husband and adult daughter   One story home   Social Determinants of Health   Financial Resource Strain: Not on file  Food Insecurity: Not on file  Transportation Needs: Not on file  Physical Activity: Not on file  Stress: Not on file  Social Connections: Not on file  Intimate Partner Violence: Not on file     Review of Systems   Gen: Denies any fever, chills, fatigue, weight loss, lack of appetite.  CV: Denies chest pain, heart palpitations, peripheral edema, syncope.  Resp: Denies shortness of breath at rest or with exertion. Denies wheezing or cough.  GI: Denies dysphagia or odynophagia. Denies jaundice, hematemesis, fecal incontinence. GU : Denies urinary burning,  urinary frequency, urinary hesitancy MS: Denies joint pain, muscle weakness, cramps, or limitation of movement.  Derm: Denies rash, itching, dry skin Psych: Denies depression, anxiety, memory loss, and confusion Heme: Denies bruising, bleeding, and enlarged lymph nodes.   Physical Exam   BP (!) 144/87   Pulse 76   Temp 98.1 F (36.7 C)   Ht 5\' 3"  (1.6 m)   Wt 195 lb 9.6 oz (88.7 kg)   BMI 34.65 kg/m  General:   Alert and oriented. Pleasant and cooperative. Well-nourished and well-developed.  Head:  Normocephalic and atraumatic. Eyes:  Without icterus Rectal:  anterior skin tag. Resolution of thrombosed hemorrhoids. Attempted DRE but pain with DRE. Possible left lateral prominence of internal hemorrhoid Msk:  Symmetrical without gross deformities. Normal posture. Extremities:  Without edema. Neurologic:  Alert and  oriented x4;  grossly normal neurologically. Skin:  Intact without significant lesions or rashes. Psych:  Alert and cooperative. Normal mood and affect.   Assessment/PLAN   Amy Lester is a 51 y.o. female presenting today in follow-up with a history of thrombosed hemorrhoids. She was seen 05/20/22 due to painful hemorrhoids and was past the window of time for benefits of surgical intervention. Returns today in close follow-up.   Thrombosed hemorrhoids now completely resolved. However, she is dealing with likely anal fissure. Unable to do complete DRE today. May also have concomitant internal prolapsing hemorrhoids. No anoscopy today due to pain with DRE.   Will treat with [facility] compounded cream with Nitro. Return in 3-4 weeks. Can perform anoscopy at that time and banding as appropriate.   Annitta Needs, PhD, ANP-BC [facility]     Annitta Needs, PhD, ANP-BC [facility]

## 2022-06-11 NOTE — Patient Instructions (Signed)
I called in the cream to [facility]. Use this per rectum 4 times a day. Use gloves and wash hands thereafter.   Please call if no improvement in next few weeks.  I will see you in 3 weeks!  I enjoyed seeing you again today! As you know, I value our relationship and want to provide genuine, compassionate, and quality care. I welcome your feedback. If you receive a survey regarding your visit,  I greatly appreciate you taking time to fill this out. See you next time!  Annitta Needs, PhD, ANP-BC [facility]

## 2022-06-30 ENCOUNTER — Other Ambulatory Visit (HOSPITAL_COMMUNITY): Payer: Self-pay | Admitting: Obstetrics and Gynecology

## 2022-06-30 DIAGNOSIS — Z1231 Encounter for screening mammogram for malignant neoplasm of breast: Secondary | ICD-10-CM

## 2022-07-02 ENCOUNTER — Encounter: Payer: Self-pay | Admitting: Gastroenterology

## 2022-07-02 ENCOUNTER — Ambulatory Visit (INDEPENDENT_AMBULATORY_CARE_PROVIDER_SITE_OTHER): Payer: Self-pay | Admitting: Gastroenterology

## 2022-07-02 VITALS — BP 152/84 | HR 103 | Temp 98.1°F | Ht 63.0 in | Wt 197.4 lb

## 2022-07-02 DIAGNOSIS — K649 Unspecified hemorrhoids: Secondary | ICD-10-CM

## 2022-07-02 NOTE — Patient Instructions (Signed)
We will refer you to the surgeon to talk about removing the external hemorrhoid.  Continue the rectal cream for another week.  Avoid straining, limit toilet time to 2-3 minutes.   We will see you back as needed!  I enjoyed seeing you again today! As you know, I value our relationship and want to provide genuine, compassionate, and quality care. I welcome your feedback. If you receive a survey regarding your visit,  I greatly appreciate you taking time to fill this out. See you next time!  Annitta Needs, PhD, ANP-BC [facility]

## 2022-07-02 NOTE — Progress Notes (Signed)
Gastroenterology Office Note     Primary Care Physician:  Deeann Saint, MD  Primary Gastroenterologist: Dr. Jena Gauss    Chief Complaint   Chief Complaint  Patient presents with   Hemorrhoids     History of Present Illness   Amy Lester is a 51 y.o. female presenting today in follow-up with a history of  hepatic steatosis, GERD, now with rectal bleeding. EGD and colonoscopy on file from February 2022 with both procedures entirely normal.  Due for repeat colonoscopy in 2027.     In September, she developed thrombosed hemorrhoids and knifelike pain. Thrombosed hemorrhoids have resolved. Rectal pain continued and was treated with [facility] compounded cream. Returning today in close follow-up.  She has had resolution of rectal pain. No constipation. No straining. One low-volume episode of bleeding since last seen but not persistent. No itching.  She would like to have the external hemorrhoid tag removed.     Past Medical History:  Diagnosis Date   Asthma    Blood transfusion without reported diagnosis    Chronic migraine    Common migraine with intractable migraine 02/16/2019   Diabetes mellitus without complication (HCC)    Endometriosis    Fatty liver    GERD (gastroesophageal reflux disease)    Hyperlipidemia    Hypertension    Hypothyroidism    Incontinence    Irregular heartbeat    Multiple sclerosis (HCC)    Palpitations    Postconcussive syndrome 05/09/2020   Pre-diabetes    Sleep apnea     Past Surgical History:  Procedure Laterality Date   ABDOMINAL HYSTERECTOMY     CESAREAN SECTION     x1   CHOLECYSTECTOMY     COLONOSCOPY WITH PROPOFOL N/A 11/04/2020   Surgeon: Corbin Ade, MD; normal exam.  Repeat in 10 years.   ENDOMETRIAL ABLATION     ESOPHAGOGASTRODUODENOSCOPY (EGD) WITH PROPOFOL N/A 11/04/2020   Surgeon: Corbin Ade, MD; normal exam.   FOOT SURGERY Left 06/2018   foot reconstruction   REPLACEMENT TOTAL KNEE Left      Current Outpatient Medications  Medication Sig Dispense Refill   acetaminophen (TYLENOL) 500 MG tablet Take 500 mg by mouth as needed for moderate pain.     albuterol (VENTOLIN HFA) 108 (90 Base) MCG/ACT inhaler Inhale 1-2 puffs into the lungs every 6 (six) hours as needed for wheezing or shortness of breath. 18 g 0   aspirin EC 81 MG tablet Take 1 tablet (81 mg total) by mouth daily. Swallow whole.     busPIRone (BUSPAR) 7.5 MG tablet Take 7.5 mg by mouth 2 (two) times daily as needed.     calcium carbonate (TUMS - DOSED IN MG ELEMENTAL CALCIUM) 500 MG chewable tablet Chew 2-3 tablets by mouth daily as needed for indigestion or heartburn.     cetirizine (ZYRTEC) 10 MG tablet Take 1 tablet (10 mg total) by mouth daily. 90 tablet 0   ciprofloxacin (CILOXAN) 0.3 % ophthalmic solution Place into both eyes daily.     clotrimazole-betamethasone (LOTRISONE) cream Apply 1 application topically as needed. (Patient taking differently: Apply 1 application  topically as needed (Outbreak).) 45 g 2   Cyanocobalamin (B-12 COMPLIANCE INJECTION IJ) Inject 1 Dose as directed every 30 (thirty) days.     Erenumab-aooe (AIMOVIG, 140 MG DOSE,) 70 MG/ML SOAJ Inject 140 mg into the skin every 30 (thirty) days. (Patient taking differently: Inject 140 mg into the skin every 28 (twenty-eight) days.) 1.12 mL 5  escitalopram (LEXAPRO) 20 MG tablet Take 1 tablet by mouth daily.     ezetimibe (ZETIA) 10 MG tablet Take 1 tablet (10 mg total) by mouth daily. 90 tablet 3   glipiZIDE (GLUCOTROL) 10 MG tablet Take 10 mg by mouth daily.      ibuprofen (ADVIL) 200 MG tablet Take 600-800 mg by mouth as needed for mild pain or moderate pain.     insulin degludec (TRESIBA FLEXTOUCH) 100 UNIT/ML FlexTouch Pen Inject 20 units under the skin twice daily. 15 mL 0   lisinopril (ZESTRIL) 5 MG tablet Take 1 tablet (5 mg total) by mouth daily. 90 tablet 1   MAGNESIUM OXIDE PO Take 1 tablet by mouth daily.     MELATONIN PO Take 1 capsule  by mouth at bedtime as needed (sleep).     metFORMIN (GLUCOPHAGE) 1000 MG tablet Take 1,000 mg by mouth 2 (two) times daily.     metoprolol tartrate (LOPRESSOR) 25 MG tablet Take 1 tablet (25 mg total) by mouth in the morning. 90 tablet 3   metoprolol tartrate (LOPRESSOR) 50 MG tablet Take 1 tablet (50 mg total) by mouth at bedtime. 90 tablet 3   metroNIDAZOLE (METROGEL) 1 % gel Apply 1 application topically at bedtime.      montelukast (SINGULAIR) 10 MG tablet Take 10 mg by mouth every morning.     Multiple Vitamins-Minerals (MULTIVITAMIN WITH MINERALS) tablet Take 1 tablet by mouth daily.     nitroGLYCERIN (NITROSTAT) 0.4 MG SL tablet Place 1 tablet (0.4 mg total) under the tongue every 5 (five) minutes as needed for chest pain. 25 tablet 3   nystatin cream (MYCOSTATIN) Apply 1 application topically daily as needed for dry skin.     Olopatadine HCl (PAZEO OP) Place 1 drop into both eyes daily.     omeprazole (PRILOSEC) 40 MG capsule Take 1 capsule (40 mg total) by mouth 2 (two) times daily before a meal. 60 capsule 3   ondansetron (ZOFRAN) 4 MG tablet Take 1 tablet (4 mg total) by mouth every 8 (eight) hours as needed for nausea or vomiting. (Patient taking differently: Take 4 mg by mouth as needed for nausea or vomiting.) 20 tablet 1   ondansetron (ZOFRAN) 8 MG tablet Take 1 tablet (8 mg total) by mouth every 8 (eight) hours as needed for nausea or vomiting. 20 tablet 1   predniSONE (DELTASONE) 20 MG tablet Take 2 tablets (40 mg total) by mouth daily with breakfast. 10 tablet 0   Red Yeast Rice Extract (RED YEAST RICE PO) Take 1 tablet by mouth daily.      Respiratory Therapy Supplies (CARETOUCH CPAP & BIPAP HOSE) MISC by Does not apply route. CPAP Titration @ 16 for pressure.     Riboflavin (VITAMIN B-2 PO) Take 1 tablet by mouth daily.      rizatriptan (MAXALT) 10 MG tablet Take 1 tablet (10 mg total) by mouth 3 (three) times daily as needed for migraine. (Patient taking differently: Take 10 mg  by mouth as needed for migraine.) 10 tablet 5   Semaglutide, 1 MG/DOSE, (OZEMPIC, 1 MG/DOSE,) 2 MG/1.5ML SOPN Inject 1 mg into the skin once a week. (Patient taking differently: Inject 1 mg into the skin every Sunday.) 4.5 mL 1   SYNTHROID 125 MCG tablet Take two tablets (250mg  total) by mouth days 1-6 and skip day 7. (Patient taking differently: Take 125 mcg by mouth See admin instructions. Take with 100 mcg to equal 225 mcg, 6 days per week. Skip  Sundays) 180 tablet 1   Tretinoin, Facial Wrinkles, (RENOVA) 0.02 % CREA Apply 1 application topically at bedtime.     triamcinolone cream (KENALOG) 0.1 % Apply 1 application topically 2 (two) times daily as needed. (Patient taking differently: Apply 1 application  topically 2 (two) times daily as needed (rash).) 45 g 0   triamterene-hydrochlorothiazide (MAXZIDE-25) 37.5-25 MG tablet Take 1 tablet by mouth daily. 90 tablet 3   venlafaxine XR (EFFEXOR XR) 75 MG 24 hr capsule Take 1 capsule (75 mg total) by mouth daily with breakfast. 30 capsule 5   Vitamin D, Ergocalciferol, (DRISDOL) 1.25 MG (50000 UNIT) CAPS capsule Take 1 capsule (50,000 Units total) by mouth every 7 (seven) days. 12 capsule 0   zonisamide (ZONEGRAN) 100 MG capsule Take 1 capsule daily 30 capsule 5   Continuous Blood Gluc Sensor (FREESTYLE LIBRE 2 SENSOR) MISC 2 Devices by Does not apply route every 14 (fourteen) days. (Patient not taking: Reported on 07/02/2022) 2 each 3   No current facility-administered medications for this visit.    Allergies as of 07/02/2022 - Review Complete 07/02/2022  Allergen Reaction Noted   Crestor [rosuvastatin]  08/27/2020   Lipitor [atorvastatin]  08/27/2020   Percocet [oxycodone-acetaminophen] Nausea And Vomiting 11/24/2018   Zocor [simvastatin]  08/27/2020    Family History  Problem Relation Age of Onset   Heart attack Mother    Stroke Mother    Cancer Mother        breast cancer   Hyperlipidemia Mother    Hypothyroidism Mother    Cancer  Father        lymph nodes, liver cancer   Colon cancer Father        early 87s   Other Daughter        Fine Gold Type II Syndrome and cognetive issues   Memory loss Daughter    Other Son        Fine Gold Syndrome and BPES   Diabetes Son 80       IDDM   Hypothyroidism Sister    Cancer Sister    Hyperthyroidism Neg Hx     Social History   Socioeconomic History   Marital status: Married    Spouse name: Not on file   Number of children: Not on file   Years of education: 14   Highest education level: Not on file  Occupational History   Not on file  Tobacco Use   Smoking status: Never    Passive exposure: Past   Smokeless tobacco: Never  Vaping Use   Vaping Use: Never used  Substance and Sexual Activity   Alcohol use: Yes    Comment: once a year   Drug use: Never   Sexual activity: Not Currently  Other Topics Concern   Not on file  Social History Narrative   Left handed    Caffeine 1 daily    Lives at home with husband and adult daughter   One story home   Social Determinants of Health   Financial Resource Strain: Not on file  Food Insecurity: Not on file  Transportation Needs: Not on file  Physical Activity: Not on file  Stress: Not on file  Social Connections: Not on file  Intimate Partner Violence: Not on file     Review of Systems   Gen: Denies any fever, chills, fatigue, weight loss, lack of appetite.  CV: Denies chest pain, heart palpitations, peripheral edema, syncope.  Resp: Denies shortness of breath at rest or with  exertion. Denies wheezing or cough.  GI: see HPI GU : Denies urinary burning, urinary frequency, urinary hesitancy MS: Denies joint pain, muscle weakness, cramps, or limitation of movement.  Derm: Denies rash, itching, dry skin Psych: Denies depression, anxiety, memory loss, and confusion Heme: Denies bruising, bleeding, and enlarged lymph nodes.   Physical Exam   BP (!) 152/84 (BP Location: Right Arm, Patient Position: Sitting,  Cuff Size: Large)   Pulse (!) 103   Temp 98.1 F (36.7 C) (Oral)   Ht 5\' 3"  (1.6 m)   Wt 197 lb 6.4 oz (89.5 kg)   SpO2 98%   BMI 34.97 kg/m  General:   Alert and oriented. Pleasant and cooperative. Well-nourished and well-developed.  Head:  Normocephalic and atraumatic. Eyes:  Without icterus Rectal:  Non-thrombosed external hemorhoid/hemorrhoid tag anterior. DRE without mass or pain. Anoscopy with Grade 1/2 columns predominantly noted in right anterior and right posterior positions.  Msk:  Symmetrical without gross deformities. Normal posture. Extremities:  Without edema. Neurologic:  Alert and  oriented x4;  grossly normal neurologically. Skin:  Intact without significant lesions or rashes. Psych:  Alert and cooperative. Normal mood and affect.   Assessment   CYNNAMON KENTER is a 51 y.o. female presenting today in follow-up with a history of  hepatic steatosis, GERD, prior thrombosed hemorrhoids, and anal fissure.  Thrombosed hemorrhoids: resolved. Now with residual skin tag and would like this evaluated by General Surgery. Anoscopy today with prominence of right anterior and right posterior columns internally; she is not having significant issues symptomatically with internal hemorrhoids, so I held off banding today. She is mainly concerned with external tags.  Anal fissure: responded nicely to Nitro compounded in [place] Cream. I have asked she continue this an additional week.   PLAN    Continue to avoid straining, limit toilet time, avoid constipation Continue compounded cream for an additional week Referral to Gen Surg at her request for external hemorrhoid/tag Return prn   Annitta Needs, PhD, [facility]

## 2022-07-10 ENCOUNTER — Encounter: Payer: Self-pay | Admitting: *Deleted

## 2022-08-04 ENCOUNTER — Encounter: Payer: Self-pay | Admitting: General Surgery

## 2022-08-04 ENCOUNTER — Ambulatory Visit (INDEPENDENT_AMBULATORY_CARE_PROVIDER_SITE_OTHER): Payer: Self-pay | Admitting: General Surgery

## 2022-08-04 VITALS — BP 133/85 | HR 67 | Temp 97.8°F | Resp 16 | Ht 63.0 in | Wt 205.0 lb

## 2022-08-04 DIAGNOSIS — K642 Third degree hemorrhoids: Secondary | ICD-10-CM

## 2022-08-04 NOTE — Progress Notes (Unsigned)
[facility] History and Physical  Reason for Referral:*** Referring Physician: ***  Chief Complaint   New Patient (Initial Visit)     Amy Lester is a 51 y.o. female.  HPI: ***.  The *** started *** and has had a duration of ***.  It is associated with ***.  The *** is improved with ***, and is made worse with ***.    Quality*** Context***  Past Medical History:  Diagnosis Date   Asthma    Blood transfusion without reported diagnosis    Chronic migraine    Common migraine with intractable migraine 02/16/2019   Diabetes mellitus without complication (HCC)    Endometriosis    Fatty liver    GERD (gastroesophageal reflux disease)    Hyperlipidemia    Hypertension    Hypothyroidism    Incontinence    Irregular heartbeat    Multiple sclerosis (HCC)    Palpitations    Postconcussive syndrome 05/09/2020   Pre-diabetes    Sleep apnea     Past Surgical History:  Procedure Laterality Date   ABDOMINAL HYSTERECTOMY     CESAREAN SECTION     x1   CHOLECYSTECTOMY     COLONOSCOPY WITH PROPOFOL N/A 11/04/2020   Surgeon: Corbin Ade, MD; normal exam.  Repeat in 10 years.   ENDOMETRIAL ABLATION     ESOPHAGOGASTRODUODENOSCOPY (EGD) WITH PROPOFOL N/A 11/04/2020   Surgeon: Corbin Ade, MD; normal exam.   FOOT SURGERY Left 06/2018   foot reconstruction   REPLACEMENT TOTAL KNEE Left     Family History  Problem Relation Age of Onset   Heart attack Mother    Stroke Mother    Cancer Mother        breast cancer   Hyperlipidemia Mother    Hypothyroidism Mother    Cancer Father        lymph nodes, liver cancer   Colon cancer Father        early 58s   Other Daughter        Fine Gold Type II Syndrome and cognetive issues   Memory loss Daughter    Other Son        Fine Gold Syndrome and BPES   Diabetes Son 22       IDDM   Hypothyroidism Sister    Cancer Sister    Hyperthyroidism Neg Hx     Social History   Tobacco Use   Smoking status: Never     Passive exposure: Past   Smokeless tobacco: Never  Vaping Use   Vaping Use: Never used  Substance Use Topics   Alcohol use: Yes    Comment: once a year   Drug use: Never    Medications: {medication reviewed/display:3041432} Allergies as of 08/04/2022       Reactions   Crestor [rosuvastatin]    myalgia   Lipitor [atorvastatin]    myalgia   Percocet [oxycodone-acetaminophen] Nausea And Vomiting   Zocor [simvastatin]    myalgia        Medication List        Accurate as of August 04, 2022 10:21 AM. If you have any questions, ask your nurse or doctor.          acetaminophen 500 MG tablet Commonly known as: TYLENOL Take 500 mg by mouth as needed for moderate pain.   Aimovig (140 MG Dose) 70 MG/ML Soaj Generic drug: Erenumab-aooe Inject 140 mg into the skin every 30 (thirty) days. What changed: when to take  this   albuterol 108 (90 Base) MCG/ACT inhaler Commonly known as: VENTOLIN HFA Inhale 1-2 puffs into the lungs every 6 (six) hours as needed for wheezing or shortness of breath.   aspirin EC 81 MG tablet Take 1 tablet (81 mg total) by mouth daily. Swallow whole.   B-12 COMPLIANCE INJECTION IJ Inject 1 Dose as directed every 30 (thirty) days.   busPIRone 7.5 MG tablet Commonly known as: BUSPAR Take 7.5 mg by mouth 2 (two) times daily as needed.   calcium carbonate 500 MG chewable tablet Commonly known as: TUMS - dosed in mg elemental calcium Chew 2-3 tablets by mouth daily as needed for indigestion or heartburn.   CareTouch CPAP & BIPAP Hose Misc by Does not apply route. CPAP Titration @ 16 for pressure.   cetirizine 10 MG tablet Commonly known as: ZYRTEC Take 1 tablet (10 mg total) by mouth daily.   ciprofloxacin 0.3 % ophthalmic solution Commonly known as: CILOXAN Place into both eyes daily.   clotrimazole-betamethasone cream Commonly known as: Lotrisone Apply 1 application topically as needed. What changed: reasons to take this    escitalopram 20 MG tablet Commonly known as: LEXAPRO Take 1 tablet by mouth daily.   ezetimibe 10 MG tablet Commonly known as: ZETIA Take 1 tablet (10 mg total) by mouth daily.   glipiZIDE 10 MG tablet Commonly known as: GLUCOTROL Take 10 mg by mouth daily.   ibuprofen 200 MG tablet Commonly known as: ADVIL Take 600-800 mg by mouth as needed for mild pain or moderate pain.   lisinopril 5 MG tablet Commonly known as: ZESTRIL Take 1 tablet (5 mg total) by mouth daily.   MAGNESIUM OXIDE PO Take 1 tablet by mouth daily.   MELATONIN PO Take 1 capsule by mouth at bedtime as needed (sleep).   metFORMIN 1000 MG tablet Commonly known as: GLUCOPHAGE Take 1,000 mg by mouth 2 (two) times daily.   metoprolol tartrate 50 MG tablet Commonly known as: LOPRESSOR Take 1 tablet (50 mg total) by mouth at bedtime.   metoprolol tartrate 25 MG tablet Commonly known as: LOPRESSOR Take 1 tablet (25 mg total) by mouth in the morning.   metroNIDAZOLE 1 % gel Commonly known as: METROGEL Apply 1 application topically at bedtime.   montelukast 10 MG tablet Commonly known as: SINGULAIR Take 10 mg by mouth every morning.   multivitamin with minerals tablet Take 1 tablet by mouth daily.   nitroGLYCERIN 0.4 MG SL tablet Commonly known as: NITROSTAT Place 1 tablet (0.4 mg total) under the tongue every 5 (five) minutes as needed for chest pain.   nystatin cream Commonly known as: MYCOSTATIN Apply 1 application topically daily as needed for dry skin.   omeprazole 40 MG capsule Commonly known as: PRILOSEC Take 1 capsule (40 mg total) by mouth 2 (two) times daily before a meal.   ondansetron 4 MG tablet Commonly known as: ZOFRAN Take 1 tablet (4 mg total) by mouth every 8 (eight) hours as needed for nausea or vomiting. What changed: when to take this   ondansetron 8 MG tablet Commonly known as: ZOFRAN Take 1 tablet (8 mg total) by mouth every 8 (eight) hours as needed for nausea or  vomiting. What changed: Another medication with the same name was changed. Make sure you understand how and when to take each.   Ozempic (1 MG/DOSE) 2 MG/1.5ML Sopn Generic drug: Semaglutide (1 MG/DOSE) Inject 1 mg into the skin once a week. What changed: when to take this  PAZEO OP Place 1 drop into both eyes daily.   predniSONE 20 MG tablet Commonly known as: DELTASONE Take 2 tablets (40 mg total) by mouth daily with breakfast.   RED YEAST RICE PO Take 1 tablet by mouth daily.   Renova 0.02 % Crea Generic drug: Tretinoin (Facial Wrinkles) Apply 1 application topically at bedtime.   rizatriptan 10 MG tablet Commonly known as: Maxalt Take 1 tablet (10 mg total) by mouth 3 (three) times daily as needed for migraine. What changed: when to take this   Synthroid 125 MCG tablet Generic drug: levothyroxine Take two tablets (250mg  total) by mouth days 1-6 and skip day 7. What changed:  how much to take how to take this when to take this additional instructions   Tresiba FlexTouch 100 UNIT/ML FlexTouch Pen Generic drug: insulin degludec Inject 20 units under the skin twice daily.   triamcinolone cream 0.1 % Commonly known as: KENALOG Apply 1 application topically 2 (two) times daily as needed. What changed: reasons to take this   triamterene-hydrochlorothiazide 37.5-25 MG tablet Commonly known as: MAXZIDE-25 Take 1 tablet by mouth daily.   venlafaxine XR 75 MG 24 hr capsule Commonly known as: Effexor XR Take 1 capsule (75 mg total) by mouth daily with breakfast.   VITAMIN B-2 PO Take 1 tablet by mouth daily.   Vitamin D (Ergocalciferol) 1.25 MG (50000 UNIT) Caps capsule Commonly known as: DRISDOL Take 1 capsule (50,000 Units total) by mouth every 7 (seven) days.   zonisamide 100 MG capsule Commonly known as: ZONEGRAN Take 1 capsule daily         ROS:  {Review of Systems:30496}  Blood pressure 133/85, pulse 67, temperature 97.8 F (36.6 C), temperature  source Oral, resp. rate 16, height 5\' 3"  (1.6 m), weight 205 lb (93 kg), SpO2 98 %. Physical Exam  Results: No results found for this or any previous visit (from the past 48 hour(s)).  No results found.   Assessment & Plan:  Amy Lester is a 51 y.o. female with *** -*** -*** -Follow up ***  All questions were answered to the satisfaction of the patient and family***.  The risk and benefits of *** were discussed including but not limited to ***.  After careful consideration, Amy Lester has decided to ***.   Future Appointments  Date Time Provider Department Center  08/14/2022 11:15 AM [facility] None  08/14/2022 12:45 PM [facility]  09/22/2022  1:30 PM 14/04/2022, MD [facility] None  10/27/2022  9:15 AM Reather Littler, MD [facility] None  11/10/2022 10:10 AM Lucretia Roers, DO [facility] None     01/10/2023 08/04/2022, 10:21 AM

## 2022-08-04 NOTE — Patient Instructions (Signed)
Surgical Procedures for Hemorrhoids Surgical procedures can be used to treat hemorrhoids. Hemorrhoids are swollen veins that are inside the rectum (internal hemorrhoids) or around the anus (external hemorrhoids). They are caused by increased pressure in the anal area. This pressure may result from straining to have a bowel movement (constipation), diarrhea, pregnancy, obesity, or sitting for long periods of time. Hemorrhoids can cause symptoms such as pain and bleeding. Surgery may be needed if diet changes, lifestyle changes, and other treatments do not help your symptoms. Common surgical methods that may be used include: Closed hemorrhoidectomy. The hemorrhoids are surgically removed, and the incisions are closed with stitches (sutures). Open hemorrhoidectomy. The hemorrhoids are surgically removed, but the incisions are allowed to heal without sutures. Stapled hemorrhoidectomy. The hemorrhoids are partially removed, and the incisions are closed with staples. Tell a health care provider about: Any allergies you have. All medicines you are taking, including vitamins, herbs, eye drops, creams, and over-the-counter medicines. Any problems you or family members have had with anesthetic medicines. Any blood disorders you have. Any surgeries you have had. Any medical conditions you have. Whether you are pregnant or may be pregnant. What are the risks? Generally, this is a safe procedure. However, problems may occur, including: Infection. Bleeding. Allergic reactions to medicines. Damage to other structures or organs. Pain. Constipation. Difficulty passing urine. Narrowing of the anal canal (stenosis). Difficulty controlling bowel movements (incontinence). Recurring hemorrhoids. A new passage (fistula) that forms between the anus or rectum and another area. What happens before the procedure? Medicines Ask your health care provider about: Changing or stopping your regular medicines. This is  especially important if you are taking diabetes medicines or blood thinners. Taking medicines such as aspirin and ibuprofen. These medicines can thin your blood. Do not take these medicines unless your health care provider tells you to take them. Taking over-the-counter medicines, vitamins, herbs, and supplements. General instructions You may need to have a procedure to examine the inside of your colon with a scope (colonoscopy). Your health care provider may do this to make sure that there are no other causes for your bleeding or pain. You may be instructed to take a laxative and an enema to clean out your colon before surgery (bowel prep). Carefully follow instructions from your health care provider about bowel prep. Plan to have someone take you home from the hospital or clinic. Plan to have a responsible adult care for you for at least 24 hours after you leave the hospital or clinic. This is important. Ask your health care provider: How your surgery site will be marked. What steps will be taken to help prevent infection. These may include: Washing skin with a germ-killing soap. Taking antibiotic medicine. What happens during the procedure? An IV will be inserted into one of your veins. You will be given one or more of the following: A medicine to help you relax (sedative). A medicine to numb the area (local anesthetic). A medicine to make you fall asleep (general anesthetic). A medicine that is injected into an area of your body to numb everything below the injection site (regional anesthetic). A lubricating jelly may be placed into your rectum. Your surgeon will insert a short scope (anoscope) into your rectum to examine the hemorrhoids. One of the following surgical methods will be used to remove the hemorrhoids: Closed hemorrhoidectomy. Your surgeon will use surgical instruments to open the tissue around the hemorrhoids. The veins that supply the hemorrhoids will be tied off with a  suture.   The hemorrhoids will be removed. The tissue that surrounds the hemorrhoids will be closed with sutures that your body can absorb (absorbable sutures). Open hemorrhoidectomy. The hemorrhoids will be removed with surgical instruments. The incisions will be left open to heal without sutures. Stapled hemorrhoidectomy. Your surgeon will use a circular stapling device to partially remove the hemorrhoids. The device will be inserted into your anus. It will remove a circular ring of tissue that includes hemorrhoid tissue and some tissue above the hemorrhoids. The staples in the device will close the edges of the tissue. This will cut off the blood supply to any remaining hemorrhoids and pull the tissue back into place. Each of these procedures may vary among health care providers and hospitals. What happens after the procedure? Your blood pressure, heart rate, breathing rate, and blood oxygen level may be monitored until you leave the hospital or clinic. You will be given pain medicine as needed. Do not drive for 24 hours if you were given a sedative during your procedure. Summary Surgery may be needed for hemorrhoids if diet changes, lifestyle changes, and other treatments do not help your symptoms. There are three common methods of surgery that are used to treat hemorrhoids. Follow instructions from your health care provider about taking medicines and about eating and drinking before the procedure. You may be instructed to take a laxative and an enema to clean out your colon before surgery (bowel prep). This information is not intended to replace advice given to you by your health care provider. Make sure you discuss any questions you have with your health care provider. Document Revised: 03/05/2021 Document Reviewed: 03/05/2021 [employer] Patient Education  2023 [employer]  

## 2022-08-14 ENCOUNTER — Ambulatory Visit (HOSPITAL_COMMUNITY): Payer: Medicaid Other

## 2022-08-14 ENCOUNTER — Inpatient Hospital Stay: Payer: Medicaid Other | Attending: Obstetrics and Gynecology

## 2022-08-19 ENCOUNTER — Ambulatory Visit (INDEPENDENT_AMBULATORY_CARE_PROVIDER_SITE_OTHER): Payer: Medicaid Other | Admitting: Primary Care

## 2022-08-19 ENCOUNTER — Encounter: Payer: Self-pay | Admitting: Primary Care

## 2022-08-19 VITALS — BP 132/80 | HR 80 | Temp 98.4°F | Ht 64.0 in | Wt 205.4 lb

## 2022-08-19 DIAGNOSIS — G4733 Obstructive sleep apnea (adult) (pediatric): Secondary | ICD-10-CM

## 2022-08-19 NOTE — Progress Notes (Signed)
@Patient  ID: , female    DOB: 1970-09-30, 51 y.o.   MRN: [id number]  Chief Complaint  Patient presents with   New Patient (Initial Visit)    OSA  LOV 2020  Renting CPAP machine in [place]  Epworth:5     Referring provider: [place], MD  HPI: 51 year old female, never smoked. PMH significant for HTN, OSA, mild intermittent asthma, type 2 diabetes, hypothyroidism, hyperlipidemia. Patient of Dr. 40.   08/19/2022 Patient presents today for OV follow for OSA. She was last seen on 06/23/2019 by Dr. 06/25/2019. HST 05/18/19 showed severe OSA, AHI 46/5/hr with SpO2 low 89%. She received message stating motor life had been exceeded. She has been renting a CPAP machine from a place in [place]. She needs local DME company, she needs new CPAP machine and supplies. She is sleeping well at night. No issues with pressure settings. She uses F&P full face mask size medium.   Airview download 04/29/2022 - 07/27/2022 Usage days 88/90 days (98%); 87 days (97%) greater than 4 hours Average usage 10 hours 13 minutes Pressure 16 to 20 cm H2O (16.6 cm H2O-95%) Air leaks 13.4 L/min (95%) AHI 0.3  Rental PAP machine 07/27/2022 - 08/11/2022 16/16 days 100%) greater than 4 hours Average usage 10 hours 38 minutes Pressure 16 to 20 cm H2O (17.3 cm H2O-95%) Air leaks 34.1 L/min (95%) AHI 0.3   Allergies  Allergen Reactions   Crestor [Rosuvastatin]     myalgia   Lipitor [Atorvastatin]     myalgia   Percocet [Oxycodone-Acetaminophen] Nausea And Vomiting   Zocor [Simvastatin]     myalgia    Immunization History  Administered Date(s) Administered   Influenza,inj,Quad PF,6+ Mos 07/05/2019    Past Medical History:  Diagnosis Date   Asthma    Blood transfusion without reported diagnosis    Chronic migraine    Common migraine with intractable migraine 02/16/2019   Diabetes mellitus without complication (HCC)    Endometriosis    Fatty liver    GERD (gastroesophageal reflux  disease)    Hyperlipidemia    Hypertension    Hypothyroidism    Incontinence    Irregular heartbeat    Multiple sclerosis (HCC)    Palpitations    Postconcussive syndrome 05/09/2020   Pre-diabetes    Sleep apnea     Tobacco History: Social History   Tobacco Use  Smoking Status Never   Passive exposure: Past  Smokeless Tobacco Never   Counseling given: Not Answered   Outpatient Medications Prior to Visit  Medication Sig Dispense Refill   acetaminophen (TYLENOL) 500 MG tablet Take 500 mg by mouth as needed for moderate pain.     albuterol (VENTOLIN HFA) 108 (90 Base) MCG/ACT inhaler Inhale 1-2 puffs into the lungs every 6 (six) hours as needed for wheezing or shortness of breath. 18 g 0   aspirin EC 81 MG tablet Take 1 tablet (81 mg total) by mouth daily. Swallow whole.     busPIRone (BUSPAR) 7.5 MG tablet Take 7.5 mg by mouth 2 (two) times daily as needed.     calcium carbonate (TUMS - DOSED IN MG ELEMENTAL CALCIUM) 500 MG chewable tablet Chew 2-3 tablets by mouth daily as needed for indigestion or heartburn.     cetirizine (ZYRTEC) 10 MG tablet Take 1 tablet (10 mg total) by mouth daily. 90 tablet 0   ciprofloxacin (CILOXAN) 0.3 % ophthalmic solution Place into both eyes daily.     clotrimazole-betamethasone (LOTRISONE)  cream Apply 1 application topically as needed. (Patient taking differently: Apply 1 application  topically as needed (Outbreak).) 45 g 2   Cyanocobalamin (B-12 COMPLIANCE INJECTION IJ) Inject 1 Dose as directed every 30 (thirty) days.     Erenumab-aooe (AIMOVIG, 140 MG DOSE,) 70 MG/ML SOAJ Inject 140 mg into the skin every 30 (thirty) days. (Patient taking differently: Inject 140 mg into the skin every 28 (twenty-eight) days.) 1.12 mL 5   escitalopram (LEXAPRO) 20 MG tablet Take 1 tablet by mouth daily.     ezetimibe (ZETIA) 10 MG tablet Take 1 tablet (10 mg total) by mouth daily. 90 tablet 3   glipiZIDE (GLUCOTROL) 10 MG tablet Take 10 mg by mouth daily.       ibuprofen (ADVIL) 200 MG tablet Take 600-800 mg by mouth as needed for mild pain or moderate pain.     insulin degludec (TRESIBA FLEXTOUCH) 100 UNIT/ML FlexTouch Pen Inject 20 units under the skin twice daily. 15 mL 0   MAGNESIUM OXIDE PO Take 1 tablet by mouth daily.     MELATONIN PO Take 1 capsule by mouth at bedtime as needed (sleep).     metFORMIN (GLUCOPHAGE) 1000 MG tablet Take 1,000 mg by mouth 2 (two) times daily.     metoprolol tartrate (LOPRESSOR) 25 MG tablet Take 1 tablet (25 mg total) by mouth in the morning. 90 tablet 3   metoprolol tartrate (LOPRESSOR) 50 MG tablet Take 1 tablet (50 mg total) by mouth at bedtime. 90 tablet 3   metroNIDAZOLE (METROGEL) 1 % gel Apply 1 application topically at bedtime.      montelukast (SINGULAIR) 10 MG tablet Take 10 mg by mouth every morning.     Multiple Vitamins-Minerals (MULTIVITAMIN WITH MINERALS) tablet Take 1 tablet by mouth daily.     nitroGLYCERIN (NITROSTAT) 0.4 MG SL tablet Place 1 tablet (0.4 mg total) under the tongue every 5 (five) minutes as needed for chest pain. 25 tablet 3   nystatin cream (MYCOSTATIN) Apply 1 application topically daily as needed for dry skin.     Olopatadine HCl (PAZEO OP) Place 1 drop into both eyes daily.     omeprazole (PRILOSEC) 40 MG capsule Take 1 capsule (40 mg total) by mouth 2 (two) times daily before a meal. 60 capsule 3   ondansetron (ZOFRAN) 4 MG tablet Take 1 tablet (4 mg total) by mouth every 8 (eight) hours as needed for nausea or vomiting. (Patient taking differently: Take 4 mg by mouth as needed for nausea or vomiting.) 20 tablet 1   ondansetron (ZOFRAN) 8 MG tablet Take 1 tablet (8 mg total) by mouth every 8 (eight) hours as needed for nausea or vomiting. 20 tablet 1   predniSONE (DELTASONE) 20 MG tablet Take 2 tablets (40 mg total) by mouth daily with breakfast. 10 tablet 0   Red Yeast Rice Extract (RED YEAST RICE PO) Take 1 tablet by mouth daily.      Respiratory Therapy Supplies (CARETOUCH CPAP  & BIPAP HOSE) MISC by Does not apply route. CPAP Titration @ 16 for pressure.     Riboflavin (VITAMIN B-2 PO) Take 1 tablet by mouth daily.      rizatriptan (MAXALT) 10 MG tablet Take 1 tablet (10 mg total) by mouth 3 (three) times daily as needed for migraine. (Patient taking differently: Take 10 mg by mouth as needed for migraine.) 10 tablet 5   Semaglutide, 1 MG/DOSE, (OZEMPIC, 1 MG/DOSE,) 2 MG/1.5ML SOPN Inject 1 mg into the skin once a  week. (Patient taking differently: Inject 1 mg into the skin every Sunday.) 4.5 mL 1   SYNTHROID 125 MCG tablet Take two tablets (250mg  total) by mouth days 1-6 and skip day 7. (Patient taking differently: Take 125 mcg by mouth See admin instructions. Take with 100 mcg to equal 225 mcg, 6 days per week. Skip Sundays) 180 tablet 1   Tretinoin, Facial Wrinkles, (RENOVA) 0.02 % CREA Apply 1 application topically at bedtime.     triamcinolone cream (KENALOG) 0.1 % Apply 1 application topically 2 (two) times daily as needed. (Patient taking differently: Apply 1 application  topically 2 (two) times daily as needed (rash).) 45 g 0   triamterene-hydrochlorothiazide (MAXZIDE-25) 37.5-25 MG tablet Take 1 tablet by mouth daily. 90 tablet 3   venlafaxine XR (EFFEXOR XR) 75 MG 24 hr capsule Take 1 capsule (75 mg total) by mouth daily with breakfast. 30 capsule 5   Vitamin D, Ergocalciferol, (DRISDOL) 1.25 MG (50000 UNIT) CAPS capsule Take 1 capsule (50,000 Units total) by mouth every 7 (seven) days. 12 capsule 0   zonisamide (ZONEGRAN) 100 MG capsule Take 1 capsule daily 30 capsule 5   lisinopril (ZESTRIL) 5 MG tablet Take 1 tablet (5 mg total) by mouth daily. 90 tablet 1   No facility-administered medications prior to visit.   Review of Systems  Review of Systems  Constitutional: Negative.   HENT: Negative.    Respiratory: Negative.    Cardiovascular: Negative.    Physical Exam  BP 132/80 (BP Location: Left Arm, Patient Position: Sitting, Cuff Size: Large)   Pulse  80   Temp 98.4 F (36.9 C) (Oral)   Ht 5\' 4"  (1.626 m)   Wt 205 lb 6.4 oz (93.2 kg)   SpO2 99%   BMI 35.26 kg/m  Physical Exam Constitutional:      Appearance: Normal appearance.  HENT:     Head: Normocephalic and atraumatic.     Mouth/Throat:     Mouth: Mucous membranes are moist.     Pharynx: Oropharynx is clear.  Cardiovascular:     Rate and Rhythm: Normal rate and regular rhythm.  Pulmonary:     Effort: Pulmonary effort is normal.     Breath sounds: Normal breath sounds.  Skin:    General: Skin is warm and dry.  Neurological:     General: No focal deficit present.     Mental Status: She is alert and oriented to person, place, and time. Mental status is at baseline.  Psychiatric:        Mood and Affect: Mood normal.        Behavior: Behavior normal.        Thought Content: Thought content normal.        Judgment: Judgment normal.      Lab Results:  CBC    Component Value Date/Time   WBC 6.8 10/15/2021 1134   RBC 4.89 10/15/2021 1134   HGB 14.3 10/15/2021 1134   HCT 43.1 10/15/2021 1134   PLT 268.0 10/15/2021 1134   MCV 88.2 10/15/2021 1134   MCH 29.3 07/24/2020 1025   MCHC 33.1 10/15/2021 1134   RDW 13.1 10/15/2021 1134   LYMPHSABS 1.8 10/15/2021 1134   MONOABS 0.4 10/15/2021 1134   EOSABS 0.2 10/15/2021 1134   BASOSABS 0.0 10/15/2021 1134    BMET    Component Value Date/Time   NA 139 10/15/2021 1134   K 3.8 10/15/2021 1134   CL 102 10/15/2021 1134   CO2 22 10/15/2021 1134  GLUCOSE 122 (H) 10/15/2021 1134   BUN 16 10/15/2021 1134   CREATININE 0.76 10/15/2021 1134   CALCIUM 10.2 10/15/2021 1134   GFRNONAA >60 10/31/2020 1540   GFRAA >60 02/13/2020 0011    BNP No results found for: "BNP"  ProBNP No results found for: "PROBNP"  Imaging: No results found.   Assessment & Plan:   OSA on CPAP - Home sleep study 05/18/2019 showed severe obstructive sleep apnea, AHI 46.5 an hour.  Maintained on CPAP and 100% compliant with use over the last  30 days. She received message stating motor life had been exceeded, she has been renting machine from a place in [place]. Current pressure 16-20cm h20; Residual AHI 0.3/hour. She needs local DME company along with new machine and supplies. Encourage weight loss and side sleeping position. FU 31-90 days after getting new CPAP for compliance check.    Glenford Bayley, NP 08/19/2022

## 2022-08-19 NOTE — Patient Instructions (Addendum)
Orders: - DME referral for new CPAP machine -pressure 16-20cm h30. Renew CPAP supplies. F&P full face mask size medium. Heated oxygen and tubing. CPAP liners and nose gel protector. Enroll in [place].   Follow-up: - 31-90 days after getting new CPAP for compliance check   CPAP and BIPAP Information CPAP and BIPAP are methods that use air pressure to keep your airways open and to help you breathe well. CPAP and BIPAP use different amounts of pressure. Your health care provider will tell you whether CPAP or BIPAP would be more helpful for you. CPAP stands for "continuous positive airway pressure." With CPAP, the amount of pressure stays the same while you breathe in (inhale) and out (exhale). BIPAP stands for "bi-level positive airway pressure." With BIPAP, the amount of pressure will be higher when you inhale and lower when you exhale. This allows you to take larger breaths. CPAP or BIPAP may be used in the hospital, or your health care provider may want you to use it at home. You may need to have a sleep study before your health care provider can order a machine for you to use at home. What are the advantages? CPAP or BIPAP can be helpful if you have: Sleep apnea. Chronic obstructive pulmonary disease (COPD). Heart failure. Medical conditions that cause muscle weakness, including muscular dystrophy or amyotrophic lateral sclerosis (ALS). Other problems that cause breathing to be shallow, weak, abnormal, or difficult. CPAP and BIPAP are most commonly used for obstructive sleep apnea (OSA) to keep the airways from collapsing when the muscles relax during sleep. What are the risks? Generally, this is a safe treatment. However, problems may occur, including: Irritated skin or skin sores if the mask does not fit properly. Dry or stuffy nose or nosebleeds. Dry mouth. Feeling gassy or bloated. Sinus or lung infection if the equipment is not cleaned properly. When should CPAP or BIPAP be used? In  most cases, the mask only needs to be worn during sleep. Generally, the mask needs to be worn throughout the night and during any daytime naps. People with certain medical conditions may also need to wear the mask at other times, such as when they are awake. Follow instructions from your health care provider about when to use the machine. What happens during CPAP or BIPAP?  Both CPAP and BIPAP are provided by a small machine with a flexible plastic tube that attaches to a plastic mask that you wear. Air is blown through the mask into your nose or mouth. The amount of pressure that is used to blow the air can be adjusted on the machine. Your health care provider will set the pressure setting and help you find the best mask for you. Tips for using the mask Because the mask needs to be snug, some people feel trapped or closed-in (claustrophobic) when first using the mask. If you feel this way, you may need to get used to the mask. One way to do this is to hold the mask loosely over your nose or mouth and then gradually apply the mask more snugly. You can also gradually increase the amount of time that you use the mask. Masks are available in various types and sizes. If your mask does not fit well, talk with your health care provider about getting a different one. Some common types of masks include: Full face masks, which fit over the mouth and nose. Nasal masks, which fit over the nose. Nasal pillow or prong masks, which fit into the nostrils.  If you are using a mask that fits over your nose and you tend to breathe through your mouth, a chin strap may be applied to help keep your mouth closed. Use a skin barrier to protect your skin as told by your health care provider. Some CPAP and BIPAP machines have alarms that may sound if the mask comes off or develops a leak. If you have trouble with the mask, it is very important that you talk with your health care provider about finding a way to make the mask  easier to tolerate. Do not stop using the mask. There could be a negative impact on your health if you stop using the mask. Tips for using the machine Place your CPAP or BIPAP machine on a secure table or stand near an electrical outlet. Know where the on/off switch is on the machine. Follow instructions from your health care provider about how to set the pressure on your machine and when you should use it. Do not eat or drink while the CPAP or BIPAP machine is on. Food or fluids could get pushed into your lungs by the pressure of the CPAP or BIPAP. For home use, CPAP and BIPAP machines can be rented or purchased through home health care companies. Many different brands of machines are available. Renting a machine before purchasing may help you find out which particular machine works well for you. Your health insurance company may also decide which machine you may get. Keep the CPAP or BIPAP machine and attachments clean. Ask your health care provider for specific instructions. Check the humidifier if you have a dry stuffy nose or nosebleeds. Make sure it is working correctly. Follow these instructions at home: Take over-the-counter and prescription medicines only as told by your health care provider. Ask if you can take sinus medicine if your sinuses are blocked. Do not use any products that contain nicotine or tobacco. These products include cigarettes, chewing tobacco, and vaping devices, such as e-cigarettes. If you need help quitting, ask your health care provider. Keep all follow-up visits. This is important. Contact a health care provider if: You have redness or pressure sores on your head, face, mouth, or nose from the mask or head gear. You have trouble using the CPAP or BIPAP machine. You cannot tolerate wearing the CPAP or BIPAP mask. Someone tells you that you snore even when wearing your CPAP or BIPAP. Get help right away if: You have trouble breathing. You feel  confused. Summary CPAP and BIPAP are methods that use air pressure to keep your airways open and to help you breathe well. If you have trouble with the mask, it is very important that you talk with your health care provider about finding a way to make the mask easier to tolerate. Do not stop using the mask. There could be a negative impact to your health if you stop using the mask. Follow instructions from your health care provider about when to use the machine. This information is not intended to replace advice given to you by your health care provider. Make sure you discuss any questions you have with your health care provider. Document Revised: 04/02/2021 Document Reviewed: 08/02/2020 Elsevier Patient Education  [place].

## 2022-08-19 NOTE — Progress Notes (Signed)
Reviewed and agree with assessment/plan.   Coralyn Helling, MD [facility] Pulmonary/Critical Care 08/19/2022, 1:01 PM Pager:  [phone]

## 2022-08-19 NOTE — Assessment & Plan Note (Signed)
-   Home sleep study 05/18/2019 showed severe obstructive sleep apnea, AHI 46.5 an hour.  Maintained on CPAP and 100% compliant with use over the last 30 days. She received message stating motor life had been exceeded, she has been renting machine from a place in [place]. Current pressure 16-20cm h20; Residual AHI 0.3/hour. She needs local DME company along with new machine and supplies. Encourage weight loss and side sleeping position. FU 31-90 days after getting new CPAP for compliance check.

## 2022-09-22 ENCOUNTER — Ambulatory Visit: Payer: Medicaid Other | Admitting: Endocrinology

## 2022-09-22 DIAGNOSIS — E1165 Type 2 diabetes mellitus with hyperglycemia: Secondary | ICD-10-CM

## 2022-09-28 ENCOUNTER — Encounter: Payer: Self-pay | Admitting: Family Medicine

## 2022-09-28 ENCOUNTER — Ambulatory Visit (INDEPENDENT_AMBULATORY_CARE_PROVIDER_SITE_OTHER): Payer: Medicaid Other | Admitting: Family Medicine

## 2022-09-28 VITALS — BP 122/76 | HR 78 | Temp 98.2°F | Ht 64.0 in | Wt 215.4 lb

## 2022-09-28 DIAGNOSIS — E785 Hyperlipidemia, unspecified: Secondary | ICD-10-CM

## 2022-09-28 DIAGNOSIS — G35 Multiple sclerosis: Secondary | ICD-10-CM | POA: Diagnosis not present

## 2022-09-28 DIAGNOSIS — H66001 Acute suppurative otitis media without spontaneous rupture of ear drum, right ear: Secondary | ICD-10-CM

## 2022-09-28 DIAGNOSIS — E1169 Type 2 diabetes mellitus with other specified complication: Secondary | ICD-10-CM

## 2022-09-28 DIAGNOSIS — Z6836 Body mass index (BMI) 36.0-36.9, adult: Secondary | ICD-10-CM

## 2022-09-28 MED ORDER — PHENTERMINE HCL 37.5 MG PO CAPS: 37.5000 mg | ORAL_CAPSULE | ORAL | 2 refills | Status: DC

## 2022-09-28 MED ORDER — DOXYCYCLINE HYCLATE 100 MG PO TABS: 100.0000 mg | ORAL_TABLET | Freq: Two times a day (BID) | ORAL | 0 refills | Status: AC

## 2022-09-28 MED ORDER — EZETIMIBE 10 MG PO TABS: 10.0000 mg | ORAL_TABLET | Freq: Every day | ORAL | 3 refills | Status: DC

## 2022-09-28 NOTE — Progress Notes (Signed)
Established Patient Office Visit   Subjective  Patient ID: Amy Lester, female    DOB: [birth year]  Age: 52 y.o. MRN: [id number]  Chief Complaint  Patient presents with   Weight Loss    Pt wants to discuss with provider on medication to help with weight loss.     Patient endorses right ear irritation x 5 days.  States feels more than sore.  Denies popping or decreased hearing.  May have noticed some drainage.  Patient requesting prescription for phentermine for weight loss.  Patient states she is unable to exercise due to left ankle injury that causes swelling and pain.  Previously referred to weight management however patient states she is not interested in diet plan she already sees a dietitian for diabetes.  Patient denies recent falls though legs give her trouble if she does too much activity.  Told this was associated with MS.  Patient requesting all medications be sent to [facility] in [place].        Review of Systems  Constitutional:        Weight gain.  HENT:  Positive for ear pain.   Musculoskeletal:        Left ankle edema   Negative unless stated above    Objective:     BP 122/76 (BP Location: Right Arm, Patient Position: Sitting, Cuff Size: Large)   Pulse 78   Temp 98.2 F (36.8 C) (Oral)   Ht 5\' 4"  (1.626 m)   Wt 215 lb 6.4 oz (97.7 kg)   SpO2 98%   BMI 36.97 kg/m    Physical Exam Constitutional:      Appearance: Normal appearance. She is obese.  HENT:     Head: Normocephalic and atraumatic.     Right Ear: A middle ear effusion is present. Tympanic membrane is erythematous and bulging.     Left Ear: Tympanic membrane normal.     Nose: Nose normal.     Mouth/Throat:     Mouth: Mucous membranes are moist.  Eyes:     Extraocular Movements: Extraocular movements intact.     Conjunctiva/sclera: Conjunctivae normal.     Pupils: Pupils are equal, round, and reactive to light.  Cardiovascular:     Rate and Rhythm: Normal rate.  Pulmonary:     Effort:  Pulmonary effort is normal.  Musculoskeletal:     Comments: Left ankle edema.  Skin:    General: Skin is warm and dry.  Neurological:     Mental Status: She is alert and oriented to person, place, and time. Mental status is at baseline.      No results found for any visits on 09/28/22.    Assessment & Plan:  Acute suppurative otitis media of right ear without spontaneous rupture of tympanic membrane, recurrence not specified -     Doxycycline Hyclate; Take 1 tablet (100 mg total) by mouth 2 (two) times daily for 7 days.  Dispense: 14 tablet; Refill: 0  Hyperlipidemia, unspecified hyperlipidemia type -     Ezetimibe; Take 1 tablet (10 mg total) by mouth daily.  Dispense: 90 tablet; Refill: 3  MS (multiple sclerosis) (HCC) -Stable -Continue follow-up with neurology  Class 2 severe obesity due to excess calories with serious comorbidity and body mass index (BMI) of 36.0 to 36.9 in adult [facility]) -Body mass index is 36.97 kg/m. -PDMP reviewed.  It appears patient was on phentermine over the summer, prescribed by another provider in [place]. -Patient advised regular follow-up will be needed while  on phentermine.  If unable to do so not be able to continue providing refills. -     Phentermine HCl; Take 1 capsule (37.5 mg total) by mouth every morning.  Dispense: 30 capsule; Refill: 2  Type 2 diabetes mellitus with other specified complication, without long-term current use of insulin (HCC) -Hemoglobin A1c 7.8% on 10/15/2021 -Continue current meds -Continue follow-up with endocrinology  Refills on medic patient's prescriber this provider sent to patient's new pharmacy.  Advised to contact other prescribers for new prescriptions if unable to transfer prescriptions.  Return in about 3 months (around 12/28/2022).   Billie Ruddy, MD

## 2022-10-27 ENCOUNTER — Ambulatory Visit: Payer: Medicaid Other | Admitting: General Surgery

## 2022-11-05 ENCOUNTER — Encounter: Payer: Self-pay | Admitting: Radiology

## 2022-11-05 ENCOUNTER — Ambulatory Visit
Admission: EM | Admit: 2022-11-05 | Discharge: 2022-11-05 | Disposition: A | Discharge status: Home / Self Care | Payer: Medicaid Other | Attending: Family Medicine | Admitting: Family Medicine

## 2022-11-05 DIAGNOSIS — J069 Acute upper respiratory infection, unspecified: Secondary | ICD-10-CM | POA: Insufficient documentation

## 2022-11-05 DIAGNOSIS — N760 Acute vaginitis: Secondary | ICD-10-CM | POA: Diagnosis not present

## 2022-11-05 DIAGNOSIS — Z1152 Encounter for screening for COVID-19: Secondary | ICD-10-CM | POA: Insufficient documentation

## 2022-11-05 DIAGNOSIS — J029 Acute pharyngitis, unspecified: Secondary | ICD-10-CM | POA: Diagnosis present

## 2022-11-05 LAB — POCT RAPID STREP A (OFFICE): Rapid Strep A Screen: NEGATIVE

## 2022-11-05 MED ORDER — FLUTICASONE PROPIONATE 50 MCG/ACT NA SUSP
1.0000 | Freq: Two times a day (BID) | NASAL | 2 refills | Status: DC
Start: 2022-11-05 — End: 2024-01-04

## 2022-11-05 MED ORDER — PROMETHAZINE-DM 6.25-15 MG/5ML PO SYRP: 5.0000 mL | ORAL_SOLUTION | Freq: Four times a day (QID) | ORAL | 0 refills | Status: AC | PRN

## 2022-11-05 MED ORDER — METRONIDAZOLE 500 MG PO TABS: 500.0000 mg | ORAL_TABLET | Freq: Two times a day (BID) | ORAL | 0 refills | Status: AC

## 2022-11-05 NOTE — ED Triage Notes (Signed)
Pt reports sore throat, head feels clogged, fatigue, right ear pain, and states "I'm very stinky down there" x 2 days. Says she does not have vaginal discharge.

## 2022-11-05 NOTE — ED Provider Notes (Signed)
[facility]    CSN: [id number] Arrival date & time: 11/05/22  1650      History   Chief Complaint Chief Complaint  Patient presents with   Sore Throat    HPI Amy Lester is a 52 y.o. female.   Patient presenting today with several day history of sore throat, nasal congestion, facial pain and pressure, ear pressure, cough, fatigue, body aches.  Denies chest pain, shortness of breath, abdominal pain, nausea vomiting or diarrhea.  So far tried to Sudafed a few days ago with no relief.  Also complaining of vaginal odor for the past 2 days.  Denies any discharge or irritation.    Past Medical History:  Diagnosis Date   Asthma    Blood transfusion without reported diagnosis    Chronic migraine    Common migraine with intractable migraine 02/16/2019   Diabetes mellitus without complication (HCC)    Endometriosis    Fatty liver    GERD (gastroesophageal reflux disease)    Hyperlipidemia    Hypertension    Hypothyroidism    Incontinence    Irregular heartbeat    Multiple sclerosis (HCC)    Palpitations    Postconcussive syndrome 05/09/2020   Pre-diabetes    Sleep apnea     Patient Active Problem List   Diagnosis Date Noted   Rectal pain 06/11/2022   Thrombosed hemorrhoids 05/20/2022   Hemorrhoids 03/17/2021   Fatigue 03/17/2021   LLQ abdominal pain 03/17/2021   Vitamin D deficiency 01/15/2021   Non-intractable vomiting 12/13/2020   Flatulence 12/13/2020   White matter abnormality on MRI of brain 09/11/2020   Hyperlipidemia 08/28/2020   Nausea without vomiting 07/25/2020   Postconcussive syndrome 05/09/2020   Elevated LFTs 04/19/2020   Abdominal pain 04/19/2020   Dysuria 04/19/2020   Class 2 severe obesity due to excess calories with serious comorbidity and body mass index (BMI) of 38.0 to 38.9 in adult ([place]) 04/12/2019   Type 2 diabetes mellitus without complication ([place]) 123XX123   Palpitations 04/12/2019   Mild intermittent asthma without  complication 123XX123   Seasonal allergies 04/12/2019   Hypothyroidism 04/12/2019   OSA on CPAP 04/12/2019   Gastroesophageal reflux disease 04/12/2019   Essential hypertension 04/12/2019   Intractable chronic migraine without aura 02/16/2019   MS (multiple sclerosis) ([place]) 02/16/2019    Past Surgical History:  Procedure Laterality Date   ABDOMINAL HYSTERECTOMY     CESAREAN SECTION     x1   CHOLECYSTECTOMY     COLONOSCOPY WITH PROPOFOL N/A 11/04/2020   Surgeon: Daneil Dolin, MD; normal exam.  Repeat in 10 years.   ENDOMETRIAL ABLATION     ESOPHAGOGASTRODUODENOSCOPY (EGD) WITH PROPOFOL N/A 11/04/2020   Surgeon: Daneil Dolin, MD; normal exam.   FOOT SURGERY Left 06/2018   foot reconstruction   REPLACEMENT TOTAL KNEE Left     OB History   No obstetric history on file.      Home Medications    Prior to Admission medications   Medication Sig Start Date End Date Taking? Authorizing Provider  fluticasone (FLONASE) 50 MCG/ACT nasal spray Place 1 spray into both nostrils 2 (two) times daily. 11/05/22  Yes Volney American, PA-C  metroNIDAZOLE (FLAGYL) 500 MG tablet Take 1 tablet (500 mg total) by mouth 2 (two) times daily. 11/05/22  Yes Volney American, PA-C  promethazine-dextromethorphan (PROMETHAZINE-DM) 6.25-15 MG/5ML syrup Take 5 mLs by mouth 4 (four) times daily as needed. 11/05/22  Yes Volney American, PA-C  acetaminophen (  TYLENOL) 500 MG tablet Take 500 mg by mouth as needed for moderate pain. Patient not taking: Reported on 09/28/2022    [provider]  albuterol (VENTOLIN HFA) 108 (90 Base) MCG/ACT inhaler Inhale 1-2 puffs into the lungs every 6 (six) hours as needed for wheezing or shortness of breath. 12/22/21   Volney American, PA-C  aspirin EC 81 MG tablet Take 1 tablet (81 mg total) by mouth daily. Swallow whole. 02/04/21   Verta Ellen., NP  busPIRone (BUSPAR) 7.5 MG tablet Take 7.5 mg by mouth 2 (two) times daily as  needed. Patient not taking: Reported on 09/28/2022 01/07/21   [provider]  calcium carbonate (TUMS - DOSED IN MG ELEMENTAL CALCIUM) 500 MG chewable tablet Chew 2-3 tablets by mouth daily as needed for indigestion or heartburn. Patient not taking: Reported on 09/28/2022    [provider]  cetirizine (ZYRTEC) 10 MG tablet Take 1 tablet (10 mg total) by mouth daily. 03/28/19   Soyla Dryer, PA-C  ciprofloxacin (CILOXAN) 0.3 % ophthalmic solution Place into both eyes daily. 01/07/21   [provider]  clotrimazole-betamethasone (LOTRISONE) cream Apply 1 application topically as needed. Patient taking differently: Apply 1 application  topically as needed ([place]). 04/12/19   Billie Ruddy, MD  Cyanocobalamin (B-12 COMPLIANCE INJECTION IJ) Inject 1 Dose as directed every 30 (thirty) days.    [provider]  Erenumab-aooe (AIMOVIG, 140 MG DOSE,) 70 MG/ML SOAJ Inject 140 mg into the skin every 30 (thirty) days. Patient taking differently: Inject 140 mg into the skin every 28 (twenty-eight) days. 05/09/20   Kathrynn Ducking, MD  escitalopram (LEXAPRO) 20 MG tablet Take 1 tablet by mouth daily. 01/07/21   [provider]  ezetimibe (ZETIA) 10 MG tablet Take 1 tablet (10 mg total) by mouth daily. 09/28/22   Billie Ruddy, MD  glipiZIDE (GLUCOTROL) 10 MG tablet Take 10 mg by mouth daily.  03/22/20   [provider]  ibuprofen (ADVIL) 200 MG tablet Take 600-800 mg by mouth as needed for mild pain or moderate pain. Patient not taking: Reported on 09/28/2022    [provider]  insulin degludec (TRESIBA FLEXTOUCH) 100 UNIT/ML FlexTouch Pen Inject 20 units under the skin twice daily. 05/10/20   Elayne Snare, MD  lisinopril (ZESTRIL) 5 MG tablet Take 1 tablet (5 mg total) by mouth daily. 08/21/19 07/02/22  Arnoldo Lenis, MD  MAGNESIUM OXIDE PO Take 1 tablet by mouth daily.    [provider]  MELATONIN PO Take 1 capsule by mouth at  bedtime as needed (sleep).    [provider]  metFORMIN (GLUCOPHAGE) 1000 MG tablet Take 1,000 mg by mouth 2 (two) times daily. 03/19/20   [provider]  metoprolol tartrate (LOPRESSOR) 25 MG tablet Take 1 tablet (25 mg total) by mouth in the morning. 02/04/21   Verta Ellen., NP  metoprolol tartrate (LOPRESSOR) 50 MG tablet Take 1 tablet (50 mg total) by mouth at bedtime. 02/04/21   Verta Ellen., NP  metroNIDAZOLE (METROGEL) 1 % gel Apply 1 application topically at bedtime.     [provider]  montelukast (SINGULAIR) 10 MG tablet Take 10 mg by mouth every morning.    [provider]  Multiple Vitamins-Minerals (MULTIVITAMIN WITH MINERALS) tablet Take 1 tablet by mouth daily.    [provider]  nitroGLYCERIN (NITROSTAT) 0.4 MG SL tablet Place 1 tablet (0.4 mg total) under the tongue every 5 (five) minutes  as needed for chest pain. 02/04/21   Verta Ellen., NP  nystatin cream (MYCOSTATIN) Apply 1 application topically daily as needed for dry skin.    [provider]  Olopatadine HCl (PAZEO OP) Place 1 drop into both eyes daily.    [provider]  omeprazole (PRILOSEC) 40 MG capsule Take 1 capsule (40 mg total) by mouth 2 (two) times daily before a meal. 03/31/21   Erenest Rasher, PA-C  ondansetron (ZOFRAN) 4 MG tablet Take 1 tablet (4 mg total) by mouth every 8 (eight) hours as needed for nausea or vomiting. Patient taking differently: Take 4 mg by mouth as needed for nausea or vomiting. 12/13/20   Erenest Rasher, PA-C  ondansetron (ZOFRAN) 8 MG tablet Take 1 tablet (8 mg total) by mouth every 8 (eight) hours as needed for nausea or vomiting. 03/31/21   Erenest Rasher, PA-C  phentermine 37.5 MG capsule Take 1 capsule (37.5 mg total) by mouth every morning. 09/28/22   Billie Ruddy, MD  predniSONE (DELTASONE) 20 MG tablet Take 2 tablets (40 mg total) by mouth daily with breakfast. Patient not taking: Reported on  09/28/2022 12/22/21   Volney American, PA-C  Red Yeast Rice Extract (RED YEAST RICE PO) Take 1 tablet by mouth daily.     [provider]  Respiratory Therapy Supplies (CARETOUCH CPAP & BIPAP HOSE) MISC by Does not apply route. CPAP Titration @ 16 for pressure.    [provider]  Riboflavin (VITAMIN B-2 PO) Take 1 tablet by mouth daily.     [provider]  rizatriptan (MAXALT) 10 MG tablet Take 1 tablet (10 mg total) by mouth 3 (three) times daily as needed for migraine. Patient taking differently: Take 10 mg by mouth as needed for migraine. 09/11/20   Suzzanne Cloud, NP  Semaglutide, 1 MG/DOSE, (OZEMPIC, 1 MG/DOSE,) 2 MG/1.5ML SOPN Inject 1 mg into the skin once a week. Patient taking differently: Inject 1 mg into the skin every Sunday. 10/29/20   Elayne Snare, MD  SYNTHROID 125 MCG tablet Take two tablets ('250mg'$  total) by mouth days 1-6 and skip day 7. Patient taking differently: Take 125 mcg by mouth See admin instructions. Take with 100 mcg to equal 225 mcg, 6 days per week. Skip Sundays 03/20/20   Elayne Snare, MD  Tretinoin, Facial Wrinkles, (RENOVA) 0.02 % CREA Apply 1 application topically at bedtime.    [provider]  triamcinolone cream (KENALOG) 0.1 % Apply 1 application topically 2 (two) times daily as needed. Patient taking differently: Apply 1 application  topically 2 (two) times daily as needed (rash). 03/28/19   Soyla Dryer, PA-C  triamterene-hydrochlorothiazide (MAXZIDE-25) 37.5-25 MG tablet Take 1 tablet by mouth daily. 02/04/21   Verta Ellen., NP  venlafaxine XR (EFFEXOR XR) 75 MG 24 hr capsule Take 1 capsule (75 mg total) by mouth daily with breakfast. 09/11/20   Suzzanne Cloud, NP  Vitamin D, Ergocalciferol, (DRISDOL) 1.25 MG (50000 UNIT) CAPS capsule Take 1 capsule (50,000 Units total) by mouth every 7 (seven) days. 10/17/21   Billie Ruddy, MD  zonisamide (ZONEGRAN) 100 MG capsule Take 1 capsule daily 01/29/21   Pieter Partridge, DO     Family History Family History  Problem Relation Age of Onset   Heart attack Mother    Stroke Mother    Cancer Mother        breast cancer   Hyperlipidemia Mother    Hypothyroidism Mother  Cancer Father        lymph nodes, liver cancer   Colon cancer Father        early 69s   Other Daughter        Fine Gold Type II Syndrome and cognetive issues   Memory loss Daughter    Other Son        Fine Gold Syndrome and BPES   Diabetes Son 71       IDDM   Hypothyroidism Sister    Cancer Sister    Hyperthyroidism Neg Hx     Social History Social History   Tobacco Use   Smoking status: Never    Passive exposure: Past   Smokeless tobacco: Never  Vaping Use   Vaping Use: Never used  Substance Use Topics   Alcohol use: Yes    Comment: once a year   Drug use: Never     Allergies   Crestor [rosuvastatin], Lipitor [atorvastatin], Percocet [oxycodone-acetaminophen], and Zocor [simvastatin]   Review of Systems Review of Systems Per HPI  Physical Exam Triage Vital Signs ED Triage Vitals  Enc Vitals Group     BP 11/05/22 1702 (!) 146/78     Pulse Rate 11/05/22 1702 84     Resp 11/05/22 1702 20     Temp 11/05/22 1702 98.2 F (36.8 C)     Temp Source 11/05/22 1702 Oral     SpO2 11/05/22 1702 95 %     Weight --      Height --      Head Circumference --      Peak Flow --      Pain Score 11/05/22 1733 0     Pain Loc --      Pain Edu? --      Excl. in [place]? --    No data found.  Updated Vital Signs BP (!) 146/78 (BP Location: Right Arm)   Pulse 84   Temp 98.2 F (36.8 C) (Oral)   Resp 20   SpO2 95%   Visual Acuity Right Eye Distance:   Left Eye Distance:   Bilateral Distance:    Right Eye Near:   Left Eye Near:    Bilateral Near:     Physical Exam Vitals and nursing note reviewed.  Constitutional:      Appearance: Normal appearance.  HENT:     Head: Atraumatic.     Right Ear: Tympanic membrane and external ear normal.     Left Ear: Tympanic  membrane and external ear normal.     Nose: Rhinorrhea present.     Mouth/Throat:     Mouth: Mucous membranes are moist.     Pharynx: Posterior oropharyngeal erythema present.  Eyes:     Extraocular Movements: Extraocular movements intact.     Conjunctiva/sclera: Conjunctivae normal.  Cardiovascular:     Rate and Rhythm: Normal rate and regular rhythm.     Heart sounds: Normal heart sounds.  Pulmonary:     Effort: Pulmonary effort is normal.     Breath sounds: Normal breath sounds. No wheezing or rales.  Abdominal:     General: Bowel sounds are normal. There is no distension.     Palpations: Abdomen is soft.     Tenderness: There is no abdominal tenderness. There is no right CVA tenderness, left CVA tenderness or guarding.  Genitourinary:    Comments: GU exam deferred, self swab performed Musculoskeletal:        General: Normal range of motion.  Cervical back: Normal range of motion and neck supple.  Skin:    General: Skin is warm and dry.  Neurological:     Mental Status: She is alert and oriented to person, place, and time.  Psychiatric:        Mood and Affect: Mood normal.        Thought Content: Thought content normal.      UC Treatments / Results  Labs (all labs ordered are listed, but only abnormal results are displayed) Labs Reviewed  SARS CORONAVIRUS 2 (TAT 6-24 HRS)  POCT RAPID STREP A (OFFICE)  CERVICOVAGINAL ANCILLARY ONLY    EKG   Radiology No results found.  Procedures Procedures (including critical care time)  Medications Ordered in UC Medications - No data to display  Initial Impression / Assessment and Plan / UC Course  I have reviewed the triage vital signs and the nursing notes.  Pertinent labs & imaging results that were available during my care of the patient were reviewed by me and considered in my medical decision making (see chart for details).     Vitals and exam overall reassuring today, suggestive of a viral upper respiratory  infection.  Rapid strep negative, COVID testing pending.  Treat with Phenergan DM, Flonase, supportive over-the-counter medications and home care.  Suspect bacterial vaginosis causing her vaginal odor, treat with Flagyl and await vaginal swab results.  Adjust if needed.  Return for worsening symptoms.  Final Clinical Impressions(s) / UC Diagnoses   Final diagnoses:  Viral URI with cough  Acute vaginitis     Discharge Instructions      We are sending out a COVID test and a vaginal swab today to further evaluate your concerns.  The COVID test should come back tomorrow and the vaginal swab over the next few days.  If we need to make any changes someone will call you.  I have started you on Flagyl for a possible bacterial vaginal infection.  Make sure to avoid any scented soaps, wipes, feminine washes as all of these can throw off your vaginal pH and cause these infections.  I have also sent over a cough syrup and nasal spray to help with your upper respiratory symptoms, take DayQuil, NyQuil and other supportive medications over-the-counter as well.    ED Prescriptions     Medication Sig Dispense Auth. Provider   fluticasone (FLONASE) 50 MCG/ACT nasal spray Place 1 spray into both nostrils 2 (two) times daily. 16 g Volney American, [place]   metroNIDAZOLE (FLAGYL) 500 MG tablet Take 1 tablet (500 mg total) by mouth 2 (two) times daily. 14 tablet Volney American, [place]   promethazine-dextromethorphan (PROMETHAZINE-DM) 6.25-15 MG/5ML syrup Take 5 mLs by mouth 4 (four) times daily as needed. 100 mL Volney American, [place]      PDMP not reviewed this encounter.   Volney American, [place] 11/05/22 1801

## 2022-11-05 NOTE — Discharge Instructions (Signed)
We are sending out a COVID test and a vaginal swab today to further evaluate your concerns.  The COVID test should come back tomorrow and the vaginal swab over the next few days.  If we need to make any changes someone will call you.  I have started you on Flagyl for a possible bacterial vaginal infection.  Make sure to avoid any scented soaps, wipes, feminine washes as all of these can throw off your vaginal pH and cause these infections.  I have also sent over a cough syrup and nasal spray to help with your upper respiratory symptoms, take DayQuil, NyQuil and other supportive medications over-the-counter as well.

## 2022-11-06 LAB — CERVICOVAGINAL ANCILLARY ONLY
Bacterial Vaginitis (gardnerella): NEGATIVE
Candida Glabrata: NEGATIVE
Candida Vaginitis: NEGATIVE
Chlamydia: NEGATIVE
Comment: NEGATIVE
Comment: NEGATIVE
Comment: NEGATIVE
Comment: NEGATIVE
Comment: NEGATIVE
Comment: NORMAL
Neisseria Gonorrhea: NEGATIVE
Trichomonas: NEGATIVE

## 2022-11-06 LAB — SARS CORONAVIRUS 2 (TAT 6-24 HRS): SARS Coronavirus 2: NEGATIVE

## 2022-11-09 NOTE — Progress Notes (Unsigned)
NEUROLOGY FOLLOW UP OFFICE NOTE  Amy Lester [id number]  Assessment/Plan:   1.  History of multiple sclerosis - MRI of brain is suspicious although still nonspecific.  Cervical spinal cord without evidence of demyelination.  At this point, I recommend lumbar puncture to evaluate for inflammation in the CSF that would support diagnosis of MS.  Patient is agreeable 2.  Chronic migraine without aura   1.  Check MRI of brain and cervical spine with and without contrast 2.  Following MRI, lumbar puncture checking CSF cell count, protein, glucose, oligoclonal bands, IgG index, gram stain and culture 3.  In addition to Aimovig '140mg'$ , start zonisamide titrating to '100mg'$  daily.  She also takes venlafaxine XR '75mg'$  daily for anxiety which may help migraines. 4.  Maxalt and Zofran for migraine rescue 5.  Continue vitamin D 6.  Follow up after testing.     Subjective:  Amy Lester is a 52 year old left-handed female with HTN, type 2 diabetes, HLD, hypothyroidism and chronic migraines who follows up for migraines and multiple sclerosis.  UPDATE: Last seen in initial consultation on 12/17/2020.  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  MRI of cervical spine revealed no convincing cord signal abnormality or enhancement but did show similar mild to moderate canal stenosis at C5-C6 and C6-C7 with posterior discs contacting and deforming the ventral cord, similar mild canal stenosis at C7-T1, and similar mild to moderate bilateral foraminal stenosis at C6-C7.  Recommended lumbar puncture, ***  Current NSAIDS/analgesics:  Ibuprofen, naproxen Current triptans:  Maxalt '10mg'$  Current ergotamine:  none Current anti-emetic:  Zofran Current muscle relaxants:  none Current  Antihypertensive medications:  Lopressor, lisinopril Current Antidepressant medications:  Venlafaxine XR '75mg'$  QD (anxiety) Current Anticonvulsant medications:  zonisamide '100mg'$  *** Current anti-CGRP:  Aimovig '140mg'$  Q30d *** Current Vitamins/Herbal/Supplements:  Magnesium, riboflavin,  melatonin, B12 Current Antihistamines/Decongestants:  Zyrtec Other therapy:  none Hormone/birth control:  Estradiol, levothyroxine   HISTORY: She reports that she was diagnosed with multiple sclerosis around 2020 where she participated in a study at [place].  She has no available medical records regarding specifics of her diagnosis and treatment.  She presented with generalized muscle weakness, hemisensory loss, needed a wheelchair.  She was diagnosed with MRI and LP.  She was on several medications but does not know what they were.  She moved to [facility] in 2020 and established care with neurologist, Dr. Floyde Parkins.  MRI of brain with and without contrast on 03/16/2019 showed scattered T2/FLAIR hyperintense foci in the bilateral cerebral hemispheres which appeared nonspecific.  MRI of cervical spine with and without contrast same day showed some disc protrusions but no abnormality of the cervical spinal cord.  Based on these nonspecific findings, and because patient declined lumbar puncture, definite diagnosis of multiple sclerosis could not be established.  She is ambulatory but feels weak in extremities.  She has fallen twice.  She sometimes has trouble with fine-finger movements. Symptoms fluctuate, on for two to three months and improves.  When symptoms improve, never at previous baseline.  It occurs more than once a year.  Left foot reconstruction -    She has had migraines since the mid-1990s.  They are severe frontal/vertex pounding/shooting headache.  Associated nausea, photophobia, phonophobia and sometimes vomits.  They usually last all day and occur 4-5  days a week (they were daily prior to [place]).      Past NSAIDS/analgesics:  Ketorolac '10mg'$  Past abortive triptans:  Frova, sumatriptan Past abortive ergotamine:  none Past muscle relaxants:  none Past anti-emetic:  none Past antihypertensive medications:  HCTZ Past antidepressant medications:  amitriptyline Past anticonvulsant medications:  Topiramate (upset stomach), gabapentin Past anti-CGRP:  none Past vitamins/Herbal/Supplements:  none Past antihistamines/decongestants:  none Other past therapies:  None.  Does not want to try Botox.    PAST MEDICAL HISTORY: Past Medical History:  Diagnosis Date   Asthma    Blood transfusion without reported diagnosis    Chronic migraine    Common migraine with intractable migraine 02/16/2019   Diabetes mellitus without complication (HCC)    Endometriosis    Fatty liver    GERD (gastroesophageal reflux disease)    Hyperlipidemia    Hypertension    Hypothyroidism    Incontinence    Irregular heartbeat    Multiple sclerosis ([place])    Palpitations    Postconcussive syndrome 05/09/2020   Pre-diabetes    Sleep apnea     MEDICATIONS: Current Outpatient Medications on File Prior to Visit  Medication Sig Dispense Refill   acetaminophen (TYLENOL) 500 MG tablet Take 500 mg by mouth as needed for moderate pain. (Patient not taking: Reported on 09/28/2022)     albuterol (VENTOLIN HFA) 108 (90 Base) MCG/ACT inhaler Inhale 1-2 puffs into the lungs every 6 (six) hours as needed for wheezing or shortness of breath. 18 g 0   aspirin EC 81 MG tablet Take 1 tablet (81 mg total) by mouth daily. Swallow whole.     busPIRone (BUSPAR) 7.5 MG tablet Take 7.5 mg by mouth 2 (two) times daily as needed. (Patient not taking: Reported on 09/28/2022)     calcium carbonate (TUMS - DOSED IN MG ELEMENTAL CALCIUM) 500 MG chewable tablet Chew 2-3 tablets by mouth daily as needed for indigestion or heartburn. (Patient not taking: Reported on 09/28/2022)     cetirizine (ZYRTEC) 10 MG tablet Take 1 tablet (10 mg total) by mouth  daily. 90 tablet 0   ciprofloxacin (CILOXAN) 0.3 % ophthalmic solution Place into both eyes daily.     clotrimazole-betamethasone (LOTRISONE) cream Apply 1 application topically as needed. (Patient taking differently: Apply 1 application  topically as needed (Outbreak).) 45 g 2   Cyanocobalamin (B-12 COMPLIANCE INJECTION IJ) Inject 1 Dose as directed every 30 (thirty) days.     Erenumab-aooe (AIMOVIG, 140 MG DOSE,) 70 MG/ML SOAJ Inject 140 mg into the skin every 30 (thirty) days. (Patient taking differently: Inject 140 mg into the skin every 28 (twenty-eight) days.) 1.12 mL 5   escitalopram (LEXAPRO) 20 MG tablet Take 1 tablet by mouth daily.     ezetimibe (ZETIA) 10 MG tablet Take 1 tablet (10 mg total) by mouth daily. 90 tablet 3   fluticasone (FLONASE) 50 MCG/ACT nasal spray Place 1 spray into both nostrils 2 (two) times daily. 16 g 2   glipiZIDE (GLUCOTROL) 10 MG tablet Take 10 mg by mouth daily.      ibuprofen (ADVIL) 200 MG tablet Take 600-800 mg by mouth as needed for mild pain or moderate pain. (Patient not taking: Reported on 09/28/2022)     insulin degludec (TRESIBA FLEXTOUCH) 100 UNIT/ML FlexTouch Pen Inject 20 units under the skin twice daily. 15 mL 0   lisinopril (ZESTRIL) 5 MG tablet Take 1 tablet (5 mg total) by mouth daily. 90 tablet 1   MAGNESIUM OXIDE PO Take  1 tablet by mouth daily.     MELATONIN PO Take 1 capsule by mouth at bedtime as needed (sleep).     metFORMIN (GLUCOPHAGE) 1000 MG tablet Take 1,000 mg by mouth 2 (two) times daily.     metoprolol tartrate (LOPRESSOR) 25 MG tablet Take 1 tablet (25 mg total) by mouth in the morning. 90 tablet 3   metoprolol tartrate (LOPRESSOR) 50 MG tablet Take 1 tablet (50 mg total) by mouth at bedtime. 90 tablet 3   metroNIDAZOLE (FLAGYL) 500 MG tablet Take 1 tablet (500 mg total) by mouth 2 (two) times daily. 14 tablet 0   metroNIDAZOLE (METROGEL) 1 % gel Apply 1 application topically at bedtime.      montelukast (SINGULAIR) 10 MG tablet  Take 10 mg by mouth every morning.     Multiple Vitamins-Minerals (MULTIVITAMIN WITH MINERALS) tablet Take 1 tablet by mouth daily.     nitroGLYCERIN (NITROSTAT) 0.4 MG SL tablet Place 1 tablet (0.4 mg total) under the tongue every 5 (five) minutes as needed for chest pain. 25 tablet 3   nystatin cream (MYCOSTATIN) Apply 1 application topically daily as needed for dry skin.     Olopatadine HCl (PAZEO OP) Place 1 drop into both eyes daily.     omeprazole (PRILOSEC) 40 MG capsule Take 1 capsule (40 mg total) by mouth 2 (two) times daily before a meal. 60 capsule 3   ondansetron (ZOFRAN) 4 MG tablet Take 1 tablet (4 mg total) by mouth every 8 (eight) hours as needed for nausea or vomiting. (Patient taking differently: Take 4 mg by mouth as needed for nausea or vomiting.) 20 tablet 1   ondansetron (ZOFRAN) 8 MG tablet Take 1 tablet (8 mg total) by mouth every 8 (eight) hours as needed for nausea or vomiting. 20 tablet 1   phentermine 37.5 MG capsule Take 1 capsule (37.5 mg total) by mouth every morning. 30 capsule 2   predniSONE (DELTASONE) 20 MG tablet Take 2 tablets (40 mg total) by mouth daily with breakfast. (Patient not taking: Reported on 09/28/2022) 10 tablet 0   promethazine-dextromethorphan (PROMETHAZINE-DM) 6.25-15 MG/5ML syrup Take 5 mLs by mouth 4 (four) times daily as needed. 100 mL 0   Red Yeast Rice Extract (RED YEAST RICE PO) Take 1 tablet by mouth daily.      Respiratory Therapy Supplies (CARETOUCH CPAP & BIPAP HOSE) MISC by Does not apply route. CPAP Titration @ 16 for pressure.     Riboflavin (VITAMIN B-2 PO) Take 1 tablet by mouth daily.      rizatriptan (MAXALT) 10 MG tablet Take 1 tablet (10 mg total) by mouth 3 (three) times daily as needed for migraine. (Patient taking differently: Take 10 mg by mouth as needed for migraine.) 10 tablet 5   Semaglutide, 1 MG/DOSE, (OZEMPIC, 1 MG/DOSE,) 2 MG/1.5ML SOPN Inject 1 mg into the skin once a week. (Patient taking differently: Inject 1 mg  into the skin every Sunday.) 4.5 mL 1   SYNTHROID 125 MCG tablet Take two tablets ('250mg'$  total) by mouth days 1-6 and skip day 7. (Patient taking differently: Take 125 mcg by mouth See admin instructions. Take with 100 mcg to equal 225 mcg, 6 days per week. Skip Sundays) 180 tablet 1   Tretinoin, Facial Wrinkles, (RENOVA) 0.02 % CREA Apply 1 application topically at bedtime.     triamcinolone cream (KENALOG) 0.1 % Apply 1 application topically 2 (two) times daily as needed. (Patient taking differently: Apply 1 application  topically 2 (two)  times daily as needed (rash).) 45 g 0   triamterene-hydrochlorothiazide (MAXZIDE-25) 37.5-25 MG tablet Take 1 tablet by mouth daily. 90 tablet 3   venlafaxine XR (EFFEXOR XR) 75 MG 24 hr capsule Take 1 capsule (75 mg total) by mouth daily with breakfast. 30 capsule 5   Vitamin D, Ergocalciferol, (DRISDOL) 1.25 MG (50000 UNIT) CAPS capsule Take 1 capsule (50,000 Units total) by mouth every 7 (seven) days. 12 capsule 0   zonisamide (ZONEGRAN) 100 MG capsule Take 1 capsule daily 30 capsule 5   No current facility-administered medications on file prior to visit.    ALLERGIES: Allergies  Allergen Reactions   Crestor [Rosuvastatin]     myalgia   Lipitor [Atorvastatin]     myalgia   Percocet [Oxycodone-Acetaminophen] Nausea And Vomiting   Zocor [Simvastatin]     myalgia    FAMILY HISTORY: Family History  Problem Relation Age of Onset   Heart attack Mother    Stroke Mother    Cancer Mother        breast cancer   Hyperlipidemia Mother    Hypothyroidism Mother    Cancer Father        lymph nodes, liver cancer   Colon cancer Father        early 9s   Other Daughter        Fine Gold Type II Syndrome and cognetive issues   Memory loss Daughter    Other Son        Fine Gold Syndrome and BPES   Diabetes Son 25       IDDM   Hypothyroidism Sister    Cancer Sister    Hyperthyroidism Neg Hx       Objective:  *** General: No acute distress.   Patient appears ***-groomed.   Head:  Normocephalic/atraumatic Eyes:  Fundi examined but not visualized Neck: supple, no paraspinal tenderness, full range of motion Heart:  Regular rate and rhythm Lungs:  Clear to auscultation bilaterally Back: No paraspinal tenderness Neurological Exam: alert and oriented to person, place, and time.  Speech fluent and not dysarthric, language intact.  CN II-XII intact. Bulk and tone normal, muscle strength 5/5 throughout.  Sensation to light touch intact.  Deep tendon reflexes 2+ throughout, toes downgoing.  Finger to nose testing intact.  Gait normal, Romberg negative.   Metta Clines, DO  CC: ***

## 2022-11-10 ENCOUNTER — Encounter: Payer: Self-pay | Admitting: Neurology

## 2022-11-10 ENCOUNTER — Ambulatory Visit (INDEPENDENT_AMBULATORY_CARE_PROVIDER_SITE_OTHER): Payer: Medicaid Other | Admitting: Neurology

## 2022-11-10 VITALS — BP 151/85 | HR 87 | Ht 64.0 in | Wt 221.3 lb

## 2022-11-10 DIAGNOSIS — G35 Multiple sclerosis: Secondary | ICD-10-CM

## 2022-11-10 DIAGNOSIS — G43709 Chronic migraine without aura, not intractable, without status migrainosus: Secondary | ICD-10-CM | POA: Diagnosis not present

## 2022-11-10 MED ORDER — AJOVY 225 MG/1.5ML ~~LOC~~ SOAJ
225.0000 mg | SUBCUTANEOUS | 11 refills | Status: DC
Start: 2022-11-10 — End: 2024-05-12

## 2022-11-10 MED ORDER — ZONISAMIDE 100 MG PO CAPS: 200.0000 mg | ORAL_CAPSULE | Freq: Every day | ORAL | 5 refills | Status: AC

## 2022-11-10 NOTE — Patient Instructions (Signed)
Plan to switch from Aimovig to Ajovy Stop sumatriptan and over the counter pain relievers.  Only rizatriptan but Limit use of pain relievers to no more than 2 days out of week to prevent risk of rebound or medication-overuse headache. Will get notes from [facility] Follow up in 5 months or sooner pending review of notes from [facility]

## 2022-11-12 ENCOUNTER — Telehealth: Payer: Self-pay | Admitting: Primary Care

## 2022-11-12 ENCOUNTER — Telehealth: Payer: Self-pay

## 2022-11-12 NOTE — Telephone Encounter (Signed)
Patient would like to be scheduled with Derl Barrow NP on 11/16/2022. Patient coming to [place] that day. Patient phone number is [phone].

## 2022-11-12 NOTE — Telephone Encounter (Signed)
Tried calling the pt and there was no answer and no VM   Beth's schedule for 11/16/22 is full  She should keep appt she has already for 3/13 or reschedule if needed  Please let pt know this when she calls back

## 2022-11-12 NOTE — Telephone Encounter (Signed)
PA needed for Ajovy.

## 2022-11-18 ENCOUNTER — Encounter: Payer: Self-pay | Admitting: Primary Care

## 2022-11-18 ENCOUNTER — Ambulatory Visit: Payer: Medicaid Other | Admitting: Primary Care

## 2022-11-18 ENCOUNTER — Ambulatory Visit (INDEPENDENT_AMBULATORY_CARE_PROVIDER_SITE_OTHER): Payer: Medicaid Other | Admitting: Primary Care

## 2022-11-18 VITALS — BP 118/76 | HR 75 | Ht 64.0 in

## 2022-11-18 DIAGNOSIS — G4733 Obstructive sleep apnea (adult) (pediatric): Secondary | ICD-10-CM | POA: Diagnosis not present

## 2022-11-18 DIAGNOSIS — J4520 Mild intermittent asthma, uncomplicated: Secondary | ICD-10-CM

## 2022-11-18 MED ORDER — ALBUTEROL SULFATE (2.5 MG/3ML) 0.083% IN NEBU: 2.5000 mg | INHALATION_SOLUTION | Freq: Four times a day (QID) | RESPIRATORY_TRACT | 3 refills | Status: DC | PRN

## 2022-11-18 NOTE — Patient Instructions (Addendum)
Sleep apnea is well-controlled on current pressure settings No changes needed  Recommendations: - Continue her CPAP every night for minimum 4 to 6 hours or longer - Use albuterol inhaler or nebulizer every 4-6 hours as needed for breakthrough shortness of breath or wheezing - Continue montelukast at bedtime - Start Zyrtec 10 mg daily - Notify office if asthma symptoms persist or recur daily, would recommend getting formal pulmonary function testing at that time  Orders: Bacterial filters for CPAP SD card for CPAP Enroll in [place]   Follow-up: 1 year with [facility] NP

## 2022-11-18 NOTE — Progress Notes (Signed)
@Patient  ID: Amy Lester, female    DOB: 1971/08/11, 52 y.o.   MRN: [id number]  Chief Complaint  Patient presents with   Follow-up    OSA     Referring provider: Billie Ruddy, MD  HPI: 52 year old female, never smoked. PMH significant for HTN, OSA, mild intermittent asthma, type 2 diabetes, hypothyroidism, hyperlipidemia. Patient of Dr. Halford Chessman.   Previous [facility] pulmonary encounter:  08/19/2022 Patient presents today for OV follow for OSA. She was last seen on 06/23/2019 by Dr. Halford Chessman. HST 05/18/19 showed severe OSA, AHI 46/5/hr with SpO2 low 89%. She received message stating motor life had been exceeded. She has been renting a CPAP machine from a place in [place]. She needs local DME company, she needs new CPAP machine and supplies. She is sleeping well at night. No issues with pressure settings. She uses F&P full face mask size medium.   Airview download 04/29/2022 - 07/27/2022 Usage days 88/90 days (98%); 87 days (97%) greater than 4 hours Average usage 10 hours 13 minutes Pressure 16 to 20 cm H2O (16.6 cm H2O-95%) Air leaks 13.4 L/min (95%) AHI 0.3  Rental PAP machine 07/27/2022 - 08/11/2022 16/16 days 100%) greater than 4 hours Average usage 10 hours 38 minutes Pressure 16 to 20 cm H2O (17.3 cm H2O-95%) Air leaks 34.1 L/min (95%) AHI 0.3  11/18/2022 Patient presents today for CPAP compliance. She received new CPAP in February 2024. Overall she sleeps well. No excessive daytime sleepiness. She uses F&P full face mask size medium. She needs an order for bacterial air filters.   She experiences intermittent wheezing symptoms which are currently exacerbated by seasonal allergies. She is taking Singulair 10mg  daily. No shortness of breath.   Airview Oct 19, 2022 - November 17, 2022 29/29 (100); 100 > 4 hours Average usage 10 hours 23 sec Pressure 16-20cm h20 AIRLEAKS 11L/min Average AHI 0.17  Allergies  Allergen Reactions   Crestor [Rosuvastatin]     myalgia   Lipitor  [Atorvastatin]     myalgia   Percocet [Oxycodone-Acetaminophen] Nausea And Vomiting   Zocor [Simvastatin]     myalgia    Immunization History  Administered Date(s) Administered   Influenza,inj,Quad PF,6+ Mos 07/05/2019    Past Medical History:  Diagnosis Date   Asthma    Blood transfusion without reported diagnosis    Chronic migraine    Common migraine with intractable migraine 02/16/2019   Diabetes mellitus without complication (HCC)    Endometriosis    Fatty liver    GERD (gastroesophageal reflux disease)    Hyperlipidemia    Hypertension    Hypothyroidism    Incontinence    Irregular heartbeat    Multiple sclerosis ([place])    Palpitations    Postconcussive syndrome 05/09/2020   Pre-diabetes    Sleep apnea     Tobacco History: Social History   Tobacco Use  Smoking Status Never   Passive exposure: Past  Smokeless Tobacco Never   Counseling given: Not Answered   Outpatient Medications Prior to Visit  Medication Sig Dispense Refill   acetaminophen (TYLENOL) 500 MG tablet Take 500 mg by mouth as needed for moderate pain.     albuterol (VENTOLIN HFA) 108 (90 Base) MCG/ACT inhaler Inhale 1-2 puffs into the lungs every 6 (six) hours as needed for wheezing or shortness of breath. 18 g 0   aspirin EC 81 MG tablet Take 1 tablet (81 mg total) by mouth daily. Swallow whole.     busPIRone (BUSPAR) 7.5  MG tablet Take 7.5 mg by mouth 2 (two) times daily as needed.     calcium carbonate (TUMS - DOSED IN MG ELEMENTAL CALCIUM) 500 MG chewable tablet Chew 2-3 tablets by mouth daily as needed for indigestion or heartburn.     cetirizine (ZYRTEC) 10 MG tablet Take 1 tablet (10 mg total) by mouth daily. 90 tablet 0   ciprofloxacin (CILOXAN) 0.3 % ophthalmic solution Place into both eyes daily.     clotrimazole-betamethasone (LOTRISONE) cream Apply 1 application topically as needed. (Patient taking differently: Apply 1 application  topically as needed (Outbreak).) 45 g 2    Cyanocobalamin (B-12 COMPLIANCE INJECTION IJ) Inject 1 Dose as directed every 30 (thirty) days.     escitalopram (LEXAPRO) 20 MG tablet Take 1 tablet by mouth daily.     ezetimibe (ZETIA) 10 MG tablet Take 1 tablet (10 mg total) by mouth daily. 90 tablet 3   fluticasone (FLONASE) 50 MCG/ACT nasal spray Place 1 spray into both nostrils 2 (two) times daily. 16 g 2   Fremanezumab-vfrm (AJOVY) 225 MG/1.5ML SOAJ Inject 225 mg into the skin every 28 (twenty-eight) days. 1.68 mL 11   glipiZIDE (GLUCOTROL) 10 MG tablet Take 10 mg by mouth daily.      ibuprofen (ADVIL) 200 MG tablet Take 600-800 mg by mouth as needed for mild pain or moderate pain.     insulin degludec (TRESIBA FLEXTOUCH) 100 UNIT/ML FlexTouch Pen Inject 20 units under the skin twice daily. 15 mL 0   MAGNESIUM OXIDE PO Take 1 tablet by mouth daily.     MELATONIN PO Take 1 capsule by mouth at bedtime as needed (sleep).     metFORMIN (GLUCOPHAGE) 1000 MG tablet Take 1,000 mg by mouth 2 (two) times daily.     metoprolol tartrate (LOPRESSOR) 25 MG tablet Take 1 tablet (25 mg total) by mouth in the morning. 90 tablet 3   metoprolol tartrate (LOPRESSOR) 50 MG tablet Take 1 tablet (50 mg total) by mouth at bedtime. 90 tablet 3   metroNIDAZOLE (FLAGYL) 500 MG tablet Take 1 tablet (500 mg total) by mouth 2 (two) times daily. 14 tablet 0   metroNIDAZOLE (METROGEL) 1 % gel Apply 1 application topically at bedtime.      montelukast (SINGULAIR) 10 MG tablet Take 10 mg by mouth every morning.     Multiple Vitamins-Minerals (MULTIVITAMIN WITH MINERALS) tablet Take 1 tablet by mouth daily.     nitroGLYCERIN (NITROSTAT) 0.4 MG SL tablet Place 1 tablet (0.4 mg total) under the tongue every 5 (five) minutes as needed for chest pain. 25 tablet 3   nystatin cream (MYCOSTATIN) Apply 1 application topically daily as needed for dry skin.     Olopatadine HCl (PAZEO OP) Place 1 drop into both eyes daily.     omeprazole (PRILOSEC) 40 MG capsule Take 1 capsule (40  mg total) by mouth 2 (two) times daily before a meal. 60 capsule 3   ondansetron (ZOFRAN) 4 MG tablet Take 1 tablet (4 mg total) by mouth every 8 (eight) hours as needed for nausea or vomiting. (Patient taking differently: Take 4 mg by mouth as needed for nausea or vomiting.) 20 tablet 1   ondansetron (ZOFRAN) 8 MG tablet Take 1 tablet (8 mg total) by mouth every 8 (eight) hours as needed for nausea or vomiting. 20 tablet 1   phentermine 37.5 MG capsule Take 1 capsule (37.5 mg total) by mouth every morning. 30 capsule 2   predniSONE (DELTASONE) 20 MG tablet Take  2 tablets (40 mg total) by mouth daily with breakfast. 10 tablet 0   promethazine-dextromethorphan (PROMETHAZINE-DM) 6.25-15 MG/5ML syrup Take 5 mLs by mouth 4 (four) times daily as needed. 100 mL 0   Red Yeast Rice Extract (RED YEAST RICE PO) Take 1 tablet by mouth daily.      Respiratory Therapy Supplies (CARETOUCH CPAP & BIPAP HOSE) MISC by Does not apply route. CPAP Titration @ 16 for pressure.     Riboflavin (VITAMIN B-2 PO) Take 1 tablet by mouth daily.      rizatriptan (MAXALT) 10 MG tablet Take 1 tablet (10 mg total) by mouth 3 (three) times daily as needed for migraine. (Patient taking differently: Take 10 mg by mouth as needed for migraine.) 10 tablet 5   Semaglutide, 1 MG/DOSE, (OZEMPIC, 1 MG/DOSE,) 2 MG/1.5ML SOPN Inject 1 mg into the skin once a week. (Patient taking differently: Inject 1 mg into the skin every Sunday.) 4.5 mL 1   SYNTHROID 125 MCG tablet Take two tablets (250mg  total) by mouth days 1-6 and skip day 7. (Patient taking differently: Take 125 mcg by mouth See admin instructions. Take with 100 mcg to equal 225 mcg, 6 days per week. Skip Sundays) 180 tablet 1   Tretinoin, Facial Wrinkles, (RENOVA) 0.02 % CREA Apply 1 application topically at bedtime.     triamcinolone cream (KENALOG) 0.1 % Apply 1 application topically 2 (two) times daily as needed. (Patient taking differently: Apply 1 application  topically 2 (two)  times daily as needed (rash).) 45 g 0   triamterene-hydrochlorothiazide (MAXZIDE-25) 37.5-25 MG tablet Take 1 tablet by mouth daily. 90 tablet 3   venlafaxine XR (EFFEXOR XR) 75 MG 24 hr capsule Take 1 capsule (75 mg total) by mouth daily with breakfast. 30 capsule 5   Vitamin D, Ergocalciferol, (DRISDOL) 1.25 MG (50000 UNIT) CAPS capsule Take 1 capsule (50,000 Units total) by mouth every 7 (seven) days. 12 capsule 0   zonisamide (ZONEGRAN) 100 MG capsule Take 2 capsules (200 mg total) by mouth daily. 60 capsule 5   lisinopril (ZESTRIL) 5 MG tablet Take 1 tablet (5 mg total) by mouth daily. 90 tablet 1   No facility-administered medications prior to visit.      Review of Systems  Review of Systems  Constitutional: Negative.   HENT: Negative.    Respiratory:  Positive for wheezing. Negative for cough and shortness of breath.   Cardiovascular: Negative.      Physical Exam  BP 118/76 (BP Location: Left Arm, Patient Position: Sitting, Cuff Size: Normal)   Pulse 75   Ht 5\' 4"  (1.626 m)   SpO2 95%   BMI 37.99 kg/m  Physical Exam Constitutional:      Appearance: Normal appearance.  HENT:     Head: Normocephalic and atraumatic.  Cardiovascular:     Rate and Rhythm: Normal rate and regular rhythm.  Pulmonary:     Effort: Pulmonary effort is normal.     Breath sounds: Normal breath sounds.  Neurological:     General: No focal deficit present.     Mental Status: She is alert and oriented to person, place, and time. Mental status is at baseline.  Psychiatric:        Mood and Affect: Mood normal.        Behavior: Behavior normal.        Thought Content: Thought content normal.        Judgment: Judgment normal.      Lab Results:  CBC  Component Value Date/Time   WBC 6.8 10/15/2021 1134   RBC 4.89 10/15/2021 1134   HGB 14.3 10/15/2021 1134   HCT 43.1 10/15/2021 1134   PLT 268.0 10/15/2021 1134   MCV 88.2 10/15/2021 1134   MCH 29.3 07/24/2020 1025   MCHC 33.1  10/15/2021 1134   RDW 13.1 10/15/2021 1134   LYMPHSABS 1.8 10/15/2021 1134   MONOABS 0.4 10/15/2021 1134   EOSABS 0.2 10/15/2021 1134   BASOSABS 0.0 10/15/2021 1134    BMET    Component Value Date/Time   NA 139 10/15/2021 1134   K 3.8 10/15/2021 1134   CL 102 10/15/2021 1134   CO2 22 10/15/2021 1134   GLUCOSE 122 (H) 10/15/2021 1134   BUN 16 10/15/2021 1134   CREATININE 0.76 10/15/2021 1134   CALCIUM 10.2 10/15/2021 1134   GFRNONAA >60 10/31/2020 1540   GFRAA >60 02/13/2020 0011    BNP No results found for: "BNP"  ProBNP No results found for: "PROBNP"  Imaging: No results found.   Assessment & Plan:   OSA on CPAP - Patient is 100% compliant with CPAP use last 30 days > 4 hours - Pressure 16-20cm h20/ AHI 0.17 - Needs order for bacterial filters  Mild intermittent asthma without complication - No formal dx asthma. Non-smoker. Experiences intermittent wheezing, worse with seasonal allergies. Recommend continuing Singulair 10mg  daily. Add back zyrtec 10mg . Use albuterol nebulizer every 4-6 hours as needed. Patient to notify office if symptoms occuring daily, would recommend getting formal PFTs at that time.      Martyn Ehrich, NP 11/23/2022

## 2022-11-23 NOTE — Assessment & Plan Note (Signed)
-   No formal dx asthma. Non-smoker. Experiences intermittent wheezing, worse with seasonal allergies. Recommend continuing Singulair 10mg  daily. Add back zyrtec 10mg . Use albuterol nebulizer every 4-6 hours as needed. Patient to notify office if symptoms occuring daily, would recommend getting formal PFTs at that time.

## 2022-11-23 NOTE — Assessment & Plan Note (Signed)
-   Patient is 100% compliant with CPAP use last 30 days > 4 hours - Pressure 16-20cm h20/ AHI 0.17 - Needs order for bacterial filters

## 2022-11-24 LAB — CBC: RBC: 4.82 (ref 3.87–5.11)

## 2022-11-24 LAB — BASIC METABOLIC PANEL
BUN: 20 (ref 4–21)
CO2: 23 — AB (ref 13–22)
Chloride: 102 (ref 99–108)
Creatinine: 1 (ref 0.5–1.1)
Glucose: 181
Potassium: 3.8 mEq/L (ref 3.5–5.1)
Sodium: 138 (ref 137–147)

## 2022-11-24 LAB — LIPID PANEL
Cholesterol: 264 — AB (ref 0–200)
HDL: 54 (ref 35–70)
LDL Cholesterol: 160
Triglycerides: 328 — AB (ref 40–160)

## 2022-11-24 LAB — COMPREHENSIVE METABOLIC PANEL
Albumin: 4.4 (ref 3.5–5.0)
Calcium: 9.1 (ref 8.7–10.7)
Globulin: 3.6
eGFR: 71

## 2022-11-24 LAB — CBC AND DIFFERENTIAL
HCT: 43 (ref 36–46)
Hemoglobin: 14.7 (ref 12.0–16.0)
Neutrophils Absolute: 5817
Platelets: 349 10*3/uL (ref 150–400)
WBC: 8.8

## 2022-11-24 LAB — HEPATIC FUNCTION PANEL
ALT: 19 U/L (ref 7–35)
AST: 17 (ref 13–35)
Alkaline Phosphatase: 113 (ref 25–125)
Bilirubin, Total: 0.3

## 2022-11-30 ENCOUNTER — Ambulatory Visit: Payer: Medicaid Other | Admitting: Family Medicine

## 2022-12-02 ENCOUNTER — Ambulatory Visit: Payer: Medicaid Other | Admitting: Primary Care

## 2022-12-04 NOTE — Telephone Encounter (Signed)
Patient was seen by [facility] on 11/18/22. Will close encounter.

## 2023-01-04 ENCOUNTER — Ambulatory Visit: Payer: Medicaid Other | Admitting: Family Medicine

## 2023-01-19 ENCOUNTER — Encounter: Payer: Self-pay | Admitting: Endocrinology

## 2023-01-19 ENCOUNTER — Ambulatory Visit (INDEPENDENT_AMBULATORY_CARE_PROVIDER_SITE_OTHER): Payer: Medicaid Other | Admitting: Endocrinology

## 2023-01-19 VITALS — BP 140/80 | HR 89 | Ht 64.0 in | Wt 227.2 lb

## 2023-01-19 DIAGNOSIS — E1165 Type 2 diabetes mellitus with hyperglycemia: Secondary | ICD-10-CM | POA: Diagnosis not present

## 2023-01-19 DIAGNOSIS — E063 Autoimmune thyroiditis: Secondary | ICD-10-CM

## 2023-01-19 DIAGNOSIS — E782 Mixed hyperlipidemia: Secondary | ICD-10-CM | POA: Diagnosis not present

## 2023-01-19 DIAGNOSIS — Z794 Long term (current) use of insulin: Secondary | ICD-10-CM

## 2023-01-19 DIAGNOSIS — Z6835 Body mass index (BMI) 35.0-35.9, adult: Secondary | ICD-10-CM

## 2023-01-19 LAB — POCT GLYCOSYLATED HEMOGLOBIN (HGB A1C): Hemoglobin A1C: 8 % — AB (ref 4.0–5.6)

## 2023-01-19 MED ORDER — REPATHA 140 MG/ML ~~LOC~~ SOSY: PREFILLED_SYRINGE | SUBCUTANEOUS | 2 refills | Status: DC

## 2023-01-19 MED ORDER — FREESTYLE LIBRE 3 SENSOR MISC: 1.0000 | 2 refills | Status: DC

## 2023-01-19 MED ORDER — LEVOTHYROXINE SODIUM 200 MCG PO TABS: 200.0000 ug | ORAL_TABLET | Freq: Every day | ORAL | 3 refills | Status: DC

## 2023-01-19 MED ORDER — TRESIBA FLEXTOUCH 200 UNIT/ML ~~LOC~~ SOPN
PEN_INJECTOR | SUBCUTANEOUS | 1 refills | Status: AC
Start: 2023-01-19 — End: ?

## 2023-01-19 NOTE — Progress Notes (Signed)
Patient ID: Amy Lester, female   DOB: 1971-08-05, 52 y.o.   MRN: [id number]           Reason for Appointment: Endocrinology follow-up  History of Present Illness:          Date of diagnosis of type 2 diabetes mellitus:   2018      Background history:   She apparently had been told to have prediabetes prior to diagnosis of diabetes.  However she did not have gestational diabetes with her 3 pregnancies She remembers her A1c being 9% at the time of diagnosis but records are not available She was tried on Metformin but this caused abdominal discomfort and she did not take this and was not given any other treatment She thinks her blood sugars improved with better diet  Recent history:     Non-insulin hypoglycemic drugs the patient is taking are:  Ozempic 1.0 mg weekly  INSULIN regimen: Tresiba 24 units twice daily  Her A1c is now 8%  She has not been seen in follow-up since 2022  Current management, blood sugar patterns and problems identified: She previously had not been able to come regularly because of lack of insurance  She apparently has continued on the same medications that were previously prescribed but without improvement in control Again for some time she states that her diet has being poor and eating more junk food She does take Ozempic regularly but in the last 3 weeks has not taken any because of traveling and not taking her medications with her Has gained weight this year, this is despite taking phentermine from PCP, last refill was in March  She is not able to do any walking because of pain in her foot She has checked her blood sugars only sporadically and not recently, generally once or twice a day She thinks her blood sugars mostly mildly increased and was 181 with her labs in February Has been seen by dietitian in the past        Side effects from medications have been: Abdominal distress from Metformin               Glucose monitoring:  done 0-1 times a  day         Glucometer:  Generic    Blood Glucose readings by recall    PRE-MEAL Fasting Lunch Dinner Bedtime Overall  Glucose range: 160-180      Mean/median:        POST-MEAL PC Breakfast PC Lunch PC Dinner  Glucose range:     Mean/median:      Prior    PRE-MEAL Fasting Lunch Dinner Bedtime Overall  Glucose range: 100-110      Mean/median:        POST-MEAL PC Breakfast PC Lunch PC Dinner  Glucose range:   180-220  Mean/median:      Dietician visit, most recent: 11/2020  Weight history:  Wt Readings from Last 3 Encounters:  01/19/23 227 lb 3.2 oz (103.1 kg)  11/10/22 221 lb 4.8 oz (100.4 kg)  09/28/22 215 lb 6.4 oz (97.7 kg)    Glycemic control:   Lab Results  Component Value Date   HGBA1C 8.0 (A) 01/19/2023   HGBA1C 7.8 (H) 10/15/2021   HGBA1C 7.6 (H) 01/13/2021   Lab Results  Component Value Date   MICROALBUR 1.6 01/13/2021   LDLCALC 178 (H) 01/13/2021   CREATININE 0.76 10/15/2021   Lab Results  Component Value Date   MICRALBCREAT 0.9 01/13/2021  No results found for: "FRUCTOSAMINE"  HYPOTHYROIDISM: See review of systems    Office Visit on 01/19/2023  Component Date Value Ref Range Status   Hemoglobin A1C 01/19/2023 8.0 (A)  4.0 - 5.6 % Final    Allergies as of 01/19/2023       Reactions   Crestor [rosuvastatin]    myalgia   Lipitor [atorvastatin]    myalgia   Percocet [oxycodone-acetaminophen] Nausea And Vomiting   Zocor [simvastatin]    myalgia        Medication List        Accurate as of Jan 19, 2023 11:45 AM. If you have any questions, ask your nurse or doctor.          acetaminophen 500 MG tablet Commonly known as: TYLENOL Take 500 mg by mouth as needed for moderate pain.   Ajovy 225 MG/1.5ML Soaj Generic drug: Fremanezumab-vfrm Inject 225 mg into the skin every 28 (twenty-eight) days.   albuterol 108 (90 Base) MCG/ACT inhaler Commonly known as: VENTOLIN HFA Inhale 1-2 puffs into the lungs every 6 (six) hours as  needed for wheezing or shortness of breath.   albuterol (2.5 MG/3ML) 0.083% nebulizer solution Commonly known as: PROVENTIL Take 3 mLs (2.5 mg total) by nebulization every 6 (six) hours as needed for wheezing or shortness of breath.   aspirin EC 81 MG tablet Take 1 tablet (81 mg total) by mouth daily. Swallow whole.   B-12 COMPLIANCE INJECTION IJ Inject 1 Dose as directed every 30 (thirty) days.   busPIRone 7.5 MG tablet Commonly known as: BUSPAR Take 7.5 mg by mouth 2 (two) times daily as needed.   calcium carbonate 500 MG chewable tablet Commonly known as: TUMS - dosed in mg elemental calcium Chew 2-3 tablets by mouth daily as needed for indigestion or heartburn.   CareTouch CPAP & BIPAP Hose Misc by Does not apply route. CPAP Titration @ 16 for pressure.   cetirizine 10 MG tablet Commonly known as: ZYRTEC Take 1 tablet (10 mg total) by mouth daily.   ciprofloxacin 0.3 % ophthalmic solution Commonly known as: CILOXAN Place into both eyes daily.   clotrimazole-betamethasone cream Commonly known as: Lotrisone Apply 1 application topically as needed. What changed: reasons to take this   escitalopram 20 MG tablet Commonly known as: LEXAPRO Take 1 tablet by mouth daily.   ezetimibe 10 MG tablet Commonly known as: ZETIA Take 1 tablet (10 mg total) by mouth daily.   fluticasone 50 MCG/ACT nasal spray Commonly known as: FLONASE Place 1 spray into both nostrils 2 (two) times daily.   glipiZIDE 10 MG tablet Commonly known as: GLUCOTROL Take 10 mg by mouth daily.   ibuprofen 200 MG tablet Commonly known as: ADVIL Take 600-800 mg by mouth as needed for mild pain or moderate pain.   lisinopril 5 MG tablet Commonly known as: ZESTRIL Take 1 tablet (5 mg total) by mouth daily.   MAGNESIUM OXIDE PO Take 1 tablet by mouth daily.   MELATONIN PO Take 1 capsule by mouth at bedtime as needed (sleep).   metFORMIN 1000 MG tablet Commonly known as: GLUCOPHAGE Take 1,000  mg by mouth 2 (two) times daily.   metoprolol tartrate 50 MG tablet Commonly known as: LOPRESSOR Take 1 tablet (50 mg total) by mouth at bedtime.   metoprolol tartrate 25 MG tablet Commonly known as: LOPRESSOR Take 1 tablet (25 mg total) by mouth in the morning.   metroNIDAZOLE 1 % gel Commonly known as: METROGEL Apply 1 application  topically at bedtime.   metroNIDAZOLE 500 MG tablet Commonly known as: FLAGYL Take 1 tablet (500 mg total) by mouth 2 (two) times daily.   montelukast 10 MG tablet Commonly known as: SINGULAIR Take 10 mg by mouth every morning.   multivitamin with minerals tablet Take 1 tablet by mouth daily.   nitroGLYCERIN 0.4 MG SL tablet Commonly known as: NITROSTAT Place 1 tablet (0.4 mg total) under the tongue every 5 (five) minutes as needed for chest pain.   nystatin cream Commonly known as: MYCOSTATIN Apply 1 application topically daily as needed for dry skin.   omeprazole 40 MG capsule Commonly known as: PRILOSEC Take 1 capsule (40 mg total) by mouth 2 (two) times daily before a meal.   ondansetron 4 MG tablet Commonly known as: ZOFRAN Take 1 tablet (4 mg total) by mouth every 8 (eight) hours as needed for nausea or vomiting. What changed: when to take this   ondansetron 8 MG tablet Commonly known as: ZOFRAN Take 1 tablet (8 mg total) by mouth every 8 (eight) hours as needed for nausea or vomiting. What changed: Another medication with the same name was changed. Make sure you understand how and when to take each.   Ozempic (1 MG/DOSE) 2 MG/1.5ML Sopn Generic drug: Semaglutide (1 MG/DOSE) Inject 1 mg into the skin once a week. What changed: when to take this   PAZEO OP Place 1 drop into both eyes daily.   phentermine 37.5 MG capsule Take 1 capsule (37.5 mg total) by mouth every morning.   predniSONE 20 MG tablet Commonly known as: DELTASONE Take 2 tablets (40 mg total) by mouth daily with breakfast.   promethazine-dextromethorphan  6.25-15 MG/5ML syrup Commonly known as: PROMETHAZINE-DM Take 5 mLs by mouth 4 (four) times daily as needed.   RED YEAST RICE PO Take 1 tablet by mouth daily.   Renova 0.02 % Crea Generic drug: Tretinoin (Facial Wrinkles) Apply 1 application topically at bedtime.   rizatriptan 10 MG tablet Commonly known as: Maxalt Take 1 tablet (10 mg total) by mouth 3 (three) times daily as needed for migraine. What changed: when to take this   Synthroid 125 MCG tablet Generic drug: levothyroxine Take two tablets (250mg  total) by mouth days 1-6 and skip day 7. What changed:  how much to take how to take this when to take this additional instructions   Tresiba FlexTouch 100 UNIT/ML FlexTouch Pen Generic drug: insulin degludec Inject 20 units under the skin twice daily.   triamcinolone cream 0.1 % Commonly known as: KENALOG Apply 1 application topically 2 (two) times daily as needed. What changed: reasons to take this   triamterene-hydrochlorothiazide 37.5-25 MG tablet Commonly known as: MAXZIDE-25 Take 1 tablet by mouth daily.   venlafaxine XR 75 MG 24 hr capsule Commonly known as: Effexor XR Take 1 capsule (75 mg total) by mouth daily with breakfast.   VITAMIN B-2 PO Take 1 tablet by mouth daily.   Vitamin D (Ergocalciferol) 1.25 MG (50000 UNIT) Caps capsule Commonly known as: DRISDOL Take 1 capsule (50,000 Units total) by mouth every 7 (seven) days.   zonisamide 100 MG capsule Commonly known as: ZONEGRAN Take 2 capsules (200 mg total) by mouth daily.        Allergies:  Allergies  Allergen Reactions   Crestor [Rosuvastatin]     myalgia   Lipitor [Atorvastatin]     myalgia   Percocet [Oxycodone-Acetaminophen] Nausea And Vomiting   Zocor [Simvastatin]     myalgia  Past Medical History:  Diagnosis Date   Asthma    Blood transfusion without reported diagnosis    Chronic migraine    Common migraine with intractable migraine 02/16/2019   Diabetes mellitus without  complication (HCC)    Endometriosis    Fatty liver    GERD (gastroesophageal reflux disease)    Hyperlipidemia    Hypertension    Hypothyroidism    Incontinence    Irregular heartbeat    Multiple sclerosis (HCC)    Palpitations    Postconcussive syndrome 05/09/2020   Pre-diabetes    Sleep apnea     Past Surgical History:  Procedure Laterality Date   ABDOMINAL HYSTERECTOMY     CESAREAN SECTION     x1   CHOLECYSTECTOMY     COLONOSCOPY WITH PROPOFOL N/A 11/04/2020   Surgeon: Corbin Ade, MD; normal exam.  Repeat in 10 years.   ENDOMETRIAL ABLATION     ESOPHAGOGASTRODUODENOSCOPY (EGD) WITH PROPOFOL N/A 11/04/2020   Surgeon: Corbin Ade, MD; normal exam.   FOOT SURGERY Left 06/2018   foot reconstruction   REPLACEMENT TOTAL KNEE Left     Family History  Problem Relation Age of Onset   Heart attack Mother    Stroke Mother    Cancer Mother        breast cancer   Hyperlipidemia Mother    Hypothyroidism Mother    Cancer Father        lymph nodes, liver cancer   Colon cancer Father        early 6s   Other Daughter        Fine Gold Type II Syndrome and cognetive issues   Memory loss Daughter    Other Son        Fine Gold Syndrome and BPES   Diabetes Son 22       IDDM   Hypothyroidism Sister    Cancer Sister    Hyperthyroidism Neg Hx     Social History:  reports that she has never smoked. She has been exposed to tobacco smoke. She has never used smokeless tobacco. She reports current alcohol use. She reports that she does not use drugs.   Review of Systems  HYPOTHYROIDISM: She initially had symptoms of fatigue, hair loss and difficulties with weight when she was a teenager She also has a strong family history of hypothyroidism She likely has been on thyroid supplements for about 25 years or more.  Fairly consistently  Although she has been on Synthroid mostly about 2 years ago an outside physician told her to take liothyronine also that she did not know why  and she did not feel any better with starting this combination She previously was on levothyroxine 225 mcg and Cytomel 5 mcg for over 2 years  Last visit here was in 03/2021 and she was taking 250 mcg 6 days a week, subsequently has been followed by PCP Subsequently TSH level was lower in 2/23  She is complaining of feeling exhausted Her PCP checked her TSH in 2/24 when it was 24.3 but did not recommend any changes to her dosage which was 1 75 mcg, apparently was on 200 mcg in 11/23   Lab Results  Component Value Date   TSH 0.20 (L) 10/15/2021   TSH 9.47 (H) 01/13/2021   TSH 1.89 07/15/2020   FREET4 1.24 10/15/2021   FREET4 1.04 07/24/2019   FREET4 0.73 02/24/2019    HYPERLIPIDEMIA: She was given Lipitor 10 mg by her PCP but  it caused some upper body muscle aches and does not want to take statins She is able to get Repatha but has had only 1 injection previously in 2022 Subsequently she has not taken this because she got the impression that it works like vaccines in the body Recent cholesterol was 264 done elsewhere Currently only on Zetia   Lab Results  Component Value Date   CHOL 277 (H) 01/13/2021   HDL 59.40 01/13/2021   LDLCALC 178 (H) 01/13/2021   TRIG 196.0 (H) 01/13/2021   CHOLHDL 5 01/13/2021           Hypertension: Has been present since age 73 Treated with lisinopril, metoprolol and Maxzide and followed by local PCP and cardiologist Blood pressure readings:  BP Readings from Last 3 Encounters:  01/19/23 (!) 140/80  11/18/22 118/76  11/10/22 (!) 151/85  .  Lab Results  Component Value Date   K 3.8 10/15/2021    On Vitamin D2 ?  50,000 units weekly, she was apparently given an OTC product by the pharmacist, has vitamin D deficiency in the past  Currently known complications of diabetes:?  Neuropathy  LABS:  Office Visit on 01/19/2023  Component Date Value Ref Range Status   Hemoglobin A1C 01/19/2023 8.0 (A)  4.0 - 5.6 % Final    Physical  Examination:  BP (!) 140/80 (BP Location: Left Arm, Patient Position: Sitting, Cuff Size: Normal)   Pulse 89   Ht 5\' 4"  (1.626 m)   Wt 227 lb 3.2 oz (103.1 kg)   SpO2 95%   BMI 39.00 kg/m   No ankle edema present    ASSESSMENT:  Relevant records over the last 2 years and labs were reviewed in detail  Diabetes type 2 with obesity  See history of present illness for detailed discussion of current diabetes management, blood sugar patterns and problems identified  A1c is still high at 8%  Current treatment regimen is twice a day Tresiba insulin 20 units and 1 mg weekly Ozempic Not tolerating metformin  She is recently not checking her blood sugars and off medications Also has gained weight from poor diet and lack of exercise  HYPOTHYROIDISM: She has a high TSH in February and unable to assess her doses currently She has not taken her levothyroxine for 3 weeks She has had nonspecific persistent fatigue  Hyperlipidemia: She has not taking Repatha and previously has not taking statin because of myalgia Has significant hypercholesterolemia and also high triglycerides   PLAN:    She will resume Ozempic but need to start with 0.5 mg for the first 2 weeks, she will dial halfway on the 1 mg pen as discussed today When she finishes her supply in about 6 weeks she will call to let us know about going up to 2 mg  Continue Tresiba unchanged until we have better data on her blood sugars Today discussed benefits of CGM and will prescribe freestyle libre to the drugstore, she can use her app on the phone for this She will let us know when she picks this up and schedule appointment with diabetes educator for training Again discussed possibility of needing mealtime insulin Will refer her for diabetes education to help her get back on regular diet  OBESITY: She has not benefited from we will need to stop this, currently Mounjaro not covered by her insurance  Blood pressure and renal  function to be followed by cardiologist  Hypothyroidism: She may need to be back on 250 mcg but unclear  what her dosage requirement will be For now we will go up to 200 mcg at least She will have TSH rechecked on the next visit  HYPERLIPIDEMIA: Reassured her that Repatha is not like a vaccine and it only works on a liver cell receptor for cholesterol.  She is agreeable to starting back on this twice a month Meanwhile continue Zetia  There are no Patient Instructions on file for this visit.   Total visit time for evaluation and management of multiple problems, review of all relevant records, counseling = 40 minutes  Reather Littler 01/19/2023, 11:45 AM   Note: This office note was prepared with Dragon voice recognition system technology. Any transcriptional errors that result from this process are unintentional.

## 2023-01-19 NOTE — Patient Instructions (Addendum)
Ozempic 0.5 x 2 weeks then 1mg  weekly, call for 2 mg dose   Tresiba same doses for now  Check blood sugars on waking up 5 days a week  Also check blood sugars about 2 hours after meals and do this after different meals by rotation  Recommended blood sugar levels on waking up are 90-130 and about 2 hours after meal is 130-160  Please bring your blood sugar monitor to each visit, thank you

## 2023-01-20 ENCOUNTER — Encounter: Payer: Self-pay | Admitting: Endocrinology

## 2023-01-25 ENCOUNTER — Telehealth: Payer: Self-pay

## 2023-01-25 MED ORDER — REPATHA 140 MG/ML ~~LOC~~ SOSY: PREFILLED_SYRINGE | SUBCUTANEOUS | 5 refills | Status: DC

## 2023-01-25 NOTE — Telephone Encounter (Signed)
Rx corrected and sent.

## 2023-01-25 NOTE — Telephone Encounter (Signed)
Fax received to verify rx sig for Evolocumab (Repatha). Rx sent with instructions to: inject contents of pen twice a week in abdominal skin.

## 2023-02-04 ENCOUNTER — Telehealth: Payer: Self-pay

## 2023-02-04 NOTE — Telephone Encounter (Signed)
*  Endo  PA request received via CMM for FreeStyle Libre 3 Sensor  PA submitted to [facility] and has been APPROVED from 02/04/2023-08/02/2023  Key: [id number]

## 2023-02-08 ENCOUNTER — Telehealth: Payer: Self-pay

## 2023-02-08 NOTE — Telephone Encounter (Signed)
Patient Advocate Encounter   Received notification from [facility] that prior authorization is required for Repatha  Submitted: 02/08/23 Key [id number]  Status is pending

## 2023-02-17 NOTE — Telephone Encounter (Signed)
Pharmacy Patient Advocate Encounter  Received notification that the request for prior authorization has been denied      

## 2023-03-04 ENCOUNTER — Other Ambulatory Visit (HOSPITAL_COMMUNITY): Payer: Self-pay | Admitting: Family Medicine

## 2023-03-04 ENCOUNTER — Ambulatory Visit (INDEPENDENT_AMBULATORY_CARE_PROVIDER_SITE_OTHER): Payer: Medicaid Other | Admitting: Endocrinology

## 2023-03-04 ENCOUNTER — Encounter: Payer: Self-pay | Admitting: Endocrinology

## 2023-03-04 VITALS — BP 126/78 | HR 71 | Ht 64.0 in | Wt 226.0 lb

## 2023-03-04 DIAGNOSIS — M25571 Pain in right ankle and joints of right foot: Secondary | ICD-10-CM

## 2023-03-04 DIAGNOSIS — Z1231 Encounter for screening mammogram for malignant neoplasm of breast: Secondary | ICD-10-CM

## 2023-03-04 DIAGNOSIS — E063 Autoimmune thyroiditis: Secondary | ICD-10-CM | POA: Diagnosis not present

## 2023-03-04 DIAGNOSIS — E782 Mixed hyperlipidemia: Secondary | ICD-10-CM

## 2023-03-04 DIAGNOSIS — Z794 Long term (current) use of insulin: Secondary | ICD-10-CM

## 2023-03-04 DIAGNOSIS — E1165 Type 2 diabetes mellitus with hyperglycemia: Secondary | ICD-10-CM | POA: Diagnosis not present

## 2023-03-04 LAB — COMPREHENSIVE METABOLIC PANEL
ALT: 43 U/L — ABNORMAL HIGH (ref 0–35)
AST: 36 U/L (ref 0–37)
Albumin: 4.6 g/dL (ref 3.5–5.2)
Alkaline Phosphatase: 110 U/L (ref 39–117)
BUN: 16 mg/dL (ref 6–23)
CO2: 27 mEq/L (ref 19–32)
Calcium: 10 mg/dL (ref 8.4–10.5)
Chloride: 101 mEq/L (ref 96–112)
Creatinine, Ser: 0.83 mg/dL (ref 0.40–1.20)
GFR: 80.34 mL/min (ref >60.00)
Glucose, Bld: 111 mg/dL — ABNORMAL HIGH (ref 70–99)
Potassium: 3.7 mEq/L (ref 3.5–5.1)
Sodium: 138 mEq/L (ref 135–145)
Total Bilirubin: 0.5 mg/dL (ref 0.2–1.2)
Total Protein: 8.5 g/dL — ABNORMAL HIGH (ref 6.0–8.3)

## 2023-03-04 LAB — MICROALBUMIN / CREATININE URINE RATIO
Creatinine,U: 133.1 mg/dL
Microalb Creat Ratio: 0.5 mg/g (ref 0.0–30.0)
Microalb, Ur: <0.7 mg/dL (ref 0.0–1.9)

## 2023-03-04 LAB — T4, FREE: Free T4: 0.77 ng/dL (ref 0.60–1.60)

## 2023-03-04 LAB — LIPID PANEL
Cholesterol: 261 mg/dL — ABNORMAL HIGH (ref 0–200)
HDL: 48.1 mg/dL
LDL Cholesterol: 178 mg/dL — ABNORMAL HIGH (ref 0–99)
NonHDL: 212.51
Total CHOL/HDL Ratio: 5
Triglycerides: 171 mg/dL — ABNORMAL HIGH (ref 0.0–149.0)
VLDL: 34.2 mg/dL (ref 0.0–40.0)

## 2023-03-04 LAB — TSH: TSH: 30.79 u[IU]/mL — ABNORMAL HIGH (ref 0.35–5.50)

## 2023-03-04 LAB — GLUCOSE, POCT (MANUAL RESULT ENTRY): POC Glucose: 121 mg/dl — AB (ref 70–99)

## 2023-03-04 MED ORDER — OZEMPIC (2 MG/DOSE) 8 MG/3ML ~~LOC~~ SOPN
PEN_INJECTOR | SUBCUTANEOUS | 1 refills | Status: DC
Start: 2023-03-04 — End: 2023-05-13

## 2023-03-04 MED ORDER — FREESTYLE LIBRE 3 SENSOR MISC: 1.0000 | 2 refills | Status: AC

## 2023-03-04 NOTE — Progress Notes (Signed)
Patient ID: Amy Lester, female   DOB: 15-May-1971, 52 y.o.   MRN: [id number]           Reason for Appointment: Endocrinology follow-up  History of Present Illness:          Date of diagnosis of type 2 diabetes mellitus:   2018      Background history:   She apparently had been told to have prediabetes prior to diagnosis of diabetes.  However she did not have gestational diabetes with her 3 pregnancies She remembers her A1c being 9% at the time of diagnosis but records are not available She was tried on Metformin but this caused abdominal discomfort and she did not take this and was not given any other treatment She thinks her blood sugars improved with better diet  Recent history:     Non-insulin hypoglycemic drugs the patient is taking are:  Ozempic 1.0 mg weekly  INSULIN regimen: Tresiba 25 units twice daily  Her A1c is recently 8%   Current management, blood sugar patterns and problems identified:  She started Ozempic after her last visit in early May Prescription was sent to [facility] for her libre sensor but [employer] they do not have it She did not bring her meter to download again and not clear how often she is monitoring She thinks her blood sugars are generally well-controlled and better with the morning No hypoglycemia also with continuing to see about twice a day and Ozempic regularly Her weight has leveled off despite not taking phentermine recently but she wants to take it for weight loss She is not able to do any walking because of pain in her foot for which she has not had her evaluation again No hypoglycemia Blood sugar in the office today 121 which she has not eaten for 4 hours + Has been seen by dietitian in the past        Side effects from medications have been: Abdominal distress from Metformin               Glucose monitoring:  done 0-1 times a day         Glucometer:  Generic    Blood Glucose readings by recall    PRE-MEAL Fasting  Lunch Dinner Bedtime Overall  Glucose range: 95-110      Mean/median:        POST-MEAL PC Breakfast PC Lunch PC Dinner  Glucose range:   160-170  Mean/median:      Prior   PRE-MEAL Fasting Lunch Dinner Bedtime Overall  Glucose range: 160-180      Mean/median:        POST-MEAL PC Breakfast PC Lunch PC Dinner  Glucose range:     Mean/median:       Dietician visit, most recent: 11/2020  Weight history:  Wt Readings from Last 3 Encounters:  03/04/23 226 lb (102.5 kg)  01/19/23 227 lb 3.2 oz (103.1 kg)  11/10/22 221 lb 4.8 oz (100.4 kg)    Glycemic control:   Lab Results  Component Value Date   HGBA1C 8.0 (A) 01/19/2023   HGBA1C 7.8 (H) 10/15/2021   HGBA1C 7.6 (H) 01/13/2021   Lab Results  Component Value Date   MICROALBUR 1.6 01/13/2021   LDLCALC 160 11/24/2022   CREATININE 1.0 11/24/2022   Lab Results  Component Value Date   MICRALBCREAT 0.9 01/13/2021    No results found for: "FRUCTOSAMINE"  HYPOTHYROIDISM: See review of systems    No visits with results  within 1 Week(s) from this visit.  Latest known visit with results is:  Abstract on 01/20/2023  Component Date Value Ref Range Status   Hemoglobin 11/24/2022 14.7  12.0 - 16.0 Final   HCT 11/24/2022 43  36 - 46 Final   Neutrophils Absolute 11/24/2022 5,817.00   Final   Platelets 11/24/2022 349  150 - 400 K/uL Final   WBC 11/24/2022 8.8   Final   RBC 11/24/2022 4.82  3.87 - 5.11 Final   Glucose 11/24/2022 181   Final   BUN 11/24/2022 20  4 - 21 Final   CO2 11/24/2022 23 (A)  13 - 22 Final   Creatinine 11/24/2022 1.0  0.5 - 1.1 Final   Potassium 11/24/2022 3.8  3.5 - 5.1 mEq/L Final   Sodium 11/24/2022 138  137 - 147 Final   Chloride 11/24/2022 102  99 - 108 Final   Globulin 11/24/2022 3.6   Final   eGFR 11/24/2022 71   Final   Calcium 11/24/2022 9.1  8.7 - 10.7 Final   Albumin 11/24/2022 4.4  3.5 - 5.0 Final   Triglycerides 11/24/2022 328 (A)  40 - 160 Final   Cholesterol 11/24/2022 264 (A)  0 -  200 Final   HDL 11/24/2022 54  35 - 70 Final   LDL Cholesterol 11/24/2022 160   Final   Alkaline Phosphatase 11/24/2022 113  25 - 125 Final   ALT 11/24/2022 19  7 - 35 U/L Final   AST 11/24/2022 17  13 - 35 Final   Bilirubin, Total 11/24/2022 0.3   Final    Allergies as of 03/04/2023       Reactions   Crestor [rosuvastatin]    myalgia   Lipitor [atorvastatin]    myalgia   Percocet [oxycodone-acetaminophen] Nausea And Vomiting   Zocor [simvastatin]    myalgia        Medication List        Accurate as of March 04, 2023 12:03 PM. If you have any questions, ask your nurse or doctor.          acetaminophen 500 MG tablet Commonly known as: TYLENOL Take 500 mg by mouth as needed for moderate pain.   Ajovy 225 MG/1.5ML Soaj Generic drug: Fremanezumab-vfrm Inject 225 mg into the skin every 28 (twenty-eight) days.   albuterol 108 (90 Base) MCG/ACT inhaler Commonly known as: VENTOLIN HFA Inhale 1-2 puffs into the lungs every 6 (six) hours as needed for wheezing or shortness of breath.   albuterol (2.5 MG/3ML) 0.083% nebulizer solution Commonly known as: PROVENTIL Take 3 mLs (2.5 mg total) by nebulization every 6 (six) hours as needed for wheezing or shortness of breath.   aspirin EC 81 MG tablet Take 1 tablet (81 mg total) by mouth daily. Swallow whole.   B-12 COMPLIANCE INJECTION IJ Inject 1 Dose as directed every 30 (thirty) days.   busPIRone 7.5 MG tablet Commonly known as: BUSPAR Take 7.5 mg by mouth 2 (two) times daily as needed.   calcium carbonate 500 MG chewable tablet Commonly known as: TUMS - dosed in mg elemental calcium Chew 2-3 tablets by mouth daily as needed for indigestion or heartburn.   CareTouch CPAP & BIPAP Hose Misc by Does not apply route. CPAP Titration @ 16 for pressure.   cetirizine 10 MG tablet Commonly known as: ZYRTEC Take 1 tablet (10 mg total) by mouth daily.   ciprofloxacin 0.3 % ophthalmic solution Commonly known as:  CILOXAN Place into both eyes daily.  clotrimazole-betamethasone cream Commonly known as: Lotrisone Apply 1 application topically as needed. What changed: reasons to take this   escitalopram 20 MG tablet Commonly known as: LEXAPRO Take 1 tablet by mouth daily.   ezetimibe 10 MG tablet Commonly known as: ZETIA Take 1 tablet (10 mg total) by mouth daily.   fluticasone 50 MCG/ACT nasal spray Commonly known as: FLONASE Place 1 spray into both nostrils 2 (two) times daily.   FreeStyle Libre 3 Sensor Misc 1 Device by Does not apply route every 14 (fourteen) days. Apply 1 sensor on upper arm every 14 days for continuous glucose monitoring   ibuprofen 200 MG tablet Commonly known as: ADVIL Take 600-800 mg by mouth as needed for mild pain or moderate pain.   levothyroxine 200 MCG tablet Commonly known as: SYNTHROID Take 1 tablet (200 mcg total) by mouth daily.   lisinopril 5 MG tablet Commonly known as: ZESTRIL Take 1 tablet (5 mg total) by mouth daily.   MAGNESIUM OXIDE PO Take 1 tablet by mouth daily.   MELATONIN PO Take 1 capsule by mouth at bedtime as needed (sleep).   metoprolol tartrate 50 MG tablet Commonly known as: LOPRESSOR Take 1 tablet (50 mg total) by mouth at bedtime.   metoprolol tartrate 25 MG tablet Commonly known as: LOPRESSOR Take 1 tablet (25 mg total) by mouth in the morning.   metroNIDAZOLE 1 % gel Commonly known as: METROGEL Apply 1 application topically at bedtime.   metroNIDAZOLE 500 MG tablet Commonly known as: FLAGYL Take 1 tablet (500 mg total) by mouth 2 (two) times daily.   montelukast 10 MG tablet Commonly known as: SINGULAIR Take 10 mg by mouth every morning.   multivitamin with minerals tablet Take 1 tablet by mouth daily.   nitroGLYCERIN 0.4 MG SL tablet Commonly known as: NITROSTAT Place 1 tablet (0.4 mg total) under the tongue every 5 (five) minutes as needed for chest pain.   nystatin cream Commonly known as:  MYCOSTATIN Apply 1 application topically daily as needed for dry skin.   omeprazole 40 MG capsule Commonly known as: PRILOSEC Take 1 capsule (40 mg total) by mouth 2 (two) times daily before a meal.   ondansetron 4 MG tablet Commonly known as: ZOFRAN Take 1 tablet (4 mg total) by mouth every 8 (eight) hours as needed for nausea or vomiting. What changed: when to take this   ondansetron 8 MG tablet Commonly known as: ZOFRAN Take 1 tablet (8 mg total) by mouth every 8 (eight) hours as needed for nausea or vomiting. What changed: Another medication with the same name was changed. Make sure you understand how and when to take each.   Ozempic (1 MG/DOSE) 2 MG/1.5ML Sopn Generic drug: Semaglutide (1 MG/DOSE) Inject 1 mg into the skin once a week. What changed: when to take this   PAZEO OP Place 1 drop into both eyes daily.   phentermine 37.5 MG capsule Take 1 capsule (37.5 mg total) by mouth every morning.   predniSONE 20 MG tablet Commonly known as: DELTASONE Take 2 tablets (40 mg total) by mouth daily with breakfast.   promethazine-dextromethorphan 6.25-15 MG/5ML syrup Commonly known as: PROMETHAZINE-DM Take 5 mLs by mouth 4 (four) times daily as needed.   RED YEAST RICE PO Take 1 tablet by mouth daily.   Renova 0.02 % Crea Generic drug: Tretinoin (Facial Wrinkles) Apply 1 application topically at bedtime.   Repatha 140 MG/ML Sosy Generic drug: Evolocumab Inject contents of pen twice a month in abdominal  skin   rizatriptan 10 MG tablet Commonly known as: Maxalt Take 1 tablet (10 mg total) by mouth 3 (three) times daily as needed for migraine. What changed: when to take this   [place] FlexTouch 200 UNIT/ML FlexTouch Pen Generic drug: insulin degludec Inject 24 units twice daily What changed: additional instructions   triamcinolone cream 0.1 % Commonly known as: KENALOG Apply 1 application topically 2 (two) times daily as needed. What changed: reasons to take  this   triamterene-hydrochlorothiazide 37.5-25 MG tablet Commonly known as: MAXZIDE-25 Take 1 tablet by mouth daily.   venlafaxine XR 75 MG 24 hr capsule Commonly known as: Effexor XR Take 1 capsule (75 mg total) by mouth daily with breakfast.   VITAMIN B-2 PO Take 1 tablet by mouth daily.   Vitamin D (Ergocalciferol) 1.25 MG (50000 UNIT) Caps capsule Commonly known as: DRISDOL Take 1 capsule (50,000 Units total) by mouth every 7 (seven) days.   zonisamide 100 MG capsule Commonly known as: ZONEGRAN Take 2 capsules (200 mg total) by mouth daily.        Allergies:  Allergies  Allergen Reactions   Crestor [Rosuvastatin]     myalgia   Lipitor [Atorvastatin]     myalgia   Percocet [Oxycodone-Acetaminophen] Nausea And Vomiting   Zocor [Simvastatin]     myalgia    Past Medical History:  Diagnosis Date   Asthma    Blood transfusion without reported diagnosis    Chronic migraine    Common migraine with intractable migraine 02/16/2019   Diabetes mellitus without complication (HCC)    Endometriosis    Fatty liver    GERD (gastroesophageal reflux disease)    Hyperlipidemia    Hypertension    Hypothyroidism    Incontinence    Irregular heartbeat    Multiple sclerosis (HCC)    Palpitations    Postconcussive syndrome 05/09/2020   Pre-diabetes    Sleep apnea     Past Surgical History:  Procedure Laterality Date   ABDOMINAL HYSTERECTOMY     CESAREAN SECTION     x1   CHOLECYSTECTOMY     COLONOSCOPY WITH PROPOFOL N/A 11/04/2020   Surgeon: Corbin Ade, MD; normal exam.  Repeat in 10 years.   ENDOMETRIAL ABLATION     ESOPHAGOGASTRODUODENOSCOPY (EGD) WITH PROPOFOL N/A 11/04/2020   Surgeon: Corbin Ade, MD; normal exam.   FOOT SURGERY Left 06/2018   foot reconstruction   REPLACEMENT TOTAL KNEE Left     Family History  Problem Relation Age of Onset   Heart attack Mother    Stroke Mother    Cancer Mother        breast cancer   Hyperlipidemia Mother     Hypothyroidism Mother    Cancer Father        lymph nodes, liver cancer   Colon cancer Father        early 25s   Other Daughter        Fine Gold Type II Syndrome and cognetive issues   Memory loss Daughter    Other Son        Fine Gold Syndrome and BPES   Diabetes Son 22       IDDM   Hypothyroidism Sister    Cancer Sister    Hyperthyroidism Neg Hx     Social History:  reports that she has never smoked. She has been exposed to tobacco smoke. She has never used smokeless tobacco. She reports current alcohol use. She reports that she does not  use drugs.   Review of Systems  HYPOTHYROIDISM: She initially had symptoms of fatigue, hair loss and difficulties with weight when she was a teenager She also has a strong family history of hypothyroidism She likely has been on thyroid supplements for about 25 years or more.  Fairly consistently  Although she has been on Synthroid mostly about 2 years ago an outside physician told her to take liothyronine also that she did not know why and she did not feel any better with starting this combination She previously was on levothyroxine 225 mcg and Cytomel 5 mcg for over 2 years  Currently taking 175 mcg levothyroxine although this is not reflected on her medication list The dose was reduced in 2/23 when her TSH was low She does not feel any different recently with her energy level  Lab Results  Component Value Date   TSH 0.20 (L) 10/15/2021   TSH 9.47 (H) 01/13/2021   TSH 1.89 07/15/2020   FREET4 1.24 10/15/2021   FREET4 1.04 07/24/2019   FREET4 0.73 02/24/2019    HYPERLIPIDEMIA: She was given Lipitor 10 mg by her PCP but  it caused some upper body muscle aches and does not want to take statins She is able to get Repatha but has had only 1 injection previously in 2022  Subsequently she has not taken this because she got the impression that it works like vaccines in the body, she was reassured that it works safely Currently only on  [employer]  Component Value Date   CHOL 264 (A) 11/24/2022   HDL 54 11/24/2022   LDLCALC 160 11/24/2022   TRIG 328 (A) 11/24/2022   CHOLHDL 5 01/13/2021           Hypertension: Has been present since age 35 Treated with lisinopril, metoprolol and Maxzide and followed by local PCP and cardiologist Blood pressure readings:  BP Readings from Last 3 Encounters:  03/04/23 126/78  01/19/23 (!) 140/80  11/18/22 118/76  .  Lab Results  Component Value Date   K 3.8 11/24/2022    On Vitamin D2 ?  50,000 units weekly, she was apparently given an OTC product by the pharmacist, has vitamin D deficiency in the past  Currently known complications of diabetes:?  Neuropathy  LABS:  No visits with results within 1 Week(s) from this visit.  Latest known visit with results is:  Abstract on 01/20/2023  Component Date Value Ref Range Status   Hemoglobin 11/24/2022 14.7  12.0 - 16.0 Final   HCT 11/24/2022 43  36 - 46 Final   Neutrophils Absolute 11/24/2022 5,817.00   Final   Platelets 11/24/2022 349  150 - 400 K/uL Final   WBC 11/24/2022 8.8   Final   RBC 11/24/2022 4.82  3.87 - 5.11 Final   Glucose 11/24/2022 181   Final   BUN 11/24/2022 20  4 - 21 Final   CO2 11/24/2022 23 (A)  13 - 22 Final   Creatinine 11/24/2022 1.0  0.5 - 1.1 Final   Potassium 11/24/2022 3.8  3.5 - 5.1 mEq/L Final   Sodium 11/24/2022 138  137 - 147 Final   Chloride 11/24/2022 102  99 - 108 Final   Globulin 11/24/2022 3.6   Final   eGFR 11/24/2022 71   Final   Calcium 11/24/2022 9.1  8.7 - 10.7 Final   Albumin 11/24/2022 4.4  3.5 - 5.0 Final   Triglycerides 11/24/2022 328 (A)  40 - 160 Final   Cholesterol  11/24/2022 264 (A)  0 - 200 Final   HDL 11/24/2022 54  35 - 70 Final   LDL Cholesterol 11/24/2022 160   Final   Alkaline Phosphatase 11/24/2022 113  25 - 125 Final   ALT 11/24/2022 19  7 - 35 U/L Final   AST 11/24/2022 17  13 - 35 Final   Bilirubin, Total 11/24/2022 0.3   Final    Physical  Examination:  BP 126/78 (BP Location: Left Arm, Patient Position: Sitting, Cuff Size: Large)   Pulse 71   Ht 5\' 4"  (1.626 m)   Wt 226 lb (102.5 kg)   SpO2 98%   BMI 38.79 kg/m       ASSESSMENT:  Diabetes type 2 with obesity  See history of present illness for detailed discussion of current diabetes management, blood sugar patterns and problems identified  A1c is recently high at 8%  Current treatment regimen is twice a day Tresiba insulin 25 units and 1 mg weekly Ozempic Does not tolerate metformin  She did not bring her meter for review and not clear how often she checking her sugars and she claims these are normal Reportedly her A1c is still 8% on a couple of weeks ago by PCP Likely been somewhat better as blood sugars are reportedly fairly good at home and her weight has leveled off instead of gaining weight  HYPOTHYROIDISM: Taking 175 mcg levothyroxine Symptoms usually do not correlate with her labs  Hyperlipidemia: She has not taking Repatha and previously has not taking statin because of myalgia Again discussed need for pharmacological treatment given her high risk status   PLAN:    She will increase Ozempic up to 2 mg weekly since she is tolerating 1 mg well When she increases the dose next Sunday she will reduce her evening dose by 5 units and possibly further if her Premeal blood sugars are below 100 Freestyle libre sensor prescription was sent again and she will let us know if she needs any help starting this, patient booklet given  Needs consistent diet She needs to see her orthopedic surgeon to get treatment for her foot pain so she can exercise  Blood pressure and renal function to be followed by cardiologist  Hypothyroidism: TSH to be checked today  HYPERLIPIDEMIA: She agrees to go back to Repatha, apparently this is provided by patient assistance program  There are no Patient Instructions on file for this visit.     Reather Littler 03/04/2023, 12:03 PM    Note: This office note was prepared with Dragon voice recognition system technology. Any transcriptional errors that result from this process are unintentional.

## 2023-03-04 NOTE — Patient Instructions (Addendum)
Take 2 mg Ozempic weekly  Insulin 25 in am and 20 in pm  Repatha every 2 weeks

## 2023-03-17 NOTE — Progress Notes (Signed)
Thyroid level is extremely low.  If she is taking 175 mcg levothyroxine needs to change the dose to levothyroxine 112 mcg, 2 tablets daily before breakfast.  Also needs follow-up appointment in about 2 months.  Her cholesterol is higher than before and needs to get the Repatha filled as directed

## 2023-03-19 ENCOUNTER — Telehealth: Payer: Self-pay

## 2023-03-19 NOTE — Telephone Encounter (Signed)
Attempted to return call from Ms Marella Syms from [facility], she had left a message inquiring about dental services in our area that are sliding scale and or federally funded.  No answer, left voicemail requesting return call.    Francee Nodal RN [employer]

## 2023-03-22 ENCOUNTER — Ambulatory Visit (HOSPITAL_COMMUNITY)
Admission: RE | Admit: 2023-03-22 | Discharge: 2023-03-22 | Disposition: A | Discharge status: Home / Self Care | Payer: Medicaid Other | Source: Ambulatory Visit | Attending: Family Medicine | Admitting: Family Medicine

## 2023-03-22 ENCOUNTER — Encounter (HOSPITAL_COMMUNITY): Payer: Self-pay

## 2023-03-22 ENCOUNTER — Other Ambulatory Visit: Payer: Self-pay

## 2023-03-22 DIAGNOSIS — Z1231 Encounter for screening mammogram for malignant neoplasm of breast: Secondary | ICD-10-CM | POA: Diagnosis present

## 2023-03-22 MED ORDER — REPATHA 140 MG/ML ~~LOC~~ SOSY: PREFILLED_SYRINGE | SUBCUTANEOUS | 5 refills | Status: DC

## 2023-03-22 MED ORDER — LEVOTHYROXINE SODIUM 112 MCG PO TABS: ORAL_TABLET | ORAL | 1 refills | Status: DC

## 2023-03-22 MED ORDER — FREESTYLE LIBRE 3 READER DEVI: 0 refills | Status: AC

## 2023-03-22 NOTE — Progress Notes (Signed)
Patient advised and medication has been sent. Patient sent to front to schedule 2 month follow up.

## 2023-03-30 ENCOUNTER — Other Ambulatory Visit: Payer: Medicaid Other

## 2023-04-06 ENCOUNTER — Other Ambulatory Visit: Payer: Self-pay | Admitting: Endocrinology

## 2023-04-06 DIAGNOSIS — E1165 Type 2 diabetes mellitus with hyperglycemia: Secondary | ICD-10-CM

## 2023-04-06 DIAGNOSIS — E063 Autoimmune thyroiditis: Secondary | ICD-10-CM

## 2023-04-06 DIAGNOSIS — E782 Mixed hyperlipidemia: Secondary | ICD-10-CM

## 2023-04-08 ENCOUNTER — Ambulatory Visit (INDEPENDENT_AMBULATORY_CARE_PROVIDER_SITE_OTHER): Payer: Medicaid Other | Admitting: Podiatry

## 2023-04-08 ENCOUNTER — Ambulatory Visit (INDEPENDENT_AMBULATORY_CARE_PROVIDER_SITE_OTHER): Payer: Medicaid Other

## 2023-04-08 ENCOUNTER — Other Ambulatory Visit: Payer: Medicaid Other

## 2023-04-08 ENCOUNTER — Other Ambulatory Visit (INDEPENDENT_AMBULATORY_CARE_PROVIDER_SITE_OTHER): Payer: Medicaid Other

## 2023-04-08 DIAGNOSIS — E063 Autoimmune thyroiditis: Secondary | ICD-10-CM | POA: Diagnosis not present

## 2023-04-08 DIAGNOSIS — M778 Other enthesopathies, not elsewhere classified: Secondary | ICD-10-CM

## 2023-04-08 DIAGNOSIS — Z794 Long term (current) use of insulin: Secondary | ICD-10-CM

## 2023-04-08 DIAGNOSIS — M7751 Other enthesopathy of right foot: Secondary | ICD-10-CM

## 2023-04-08 DIAGNOSIS — M7671 Peroneal tendinitis, right leg: Secondary | ICD-10-CM

## 2023-04-08 DIAGNOSIS — M7750 Other enthesopathy of unspecified foot: Secondary | ICD-10-CM

## 2023-04-08 DIAGNOSIS — E782 Mixed hyperlipidemia: Secondary | ICD-10-CM

## 2023-04-08 DIAGNOSIS — E1165 Type 2 diabetes mellitus with hyperglycemia: Secondary | ICD-10-CM | POA: Diagnosis not present

## 2023-04-08 LAB — LDL CHOLESTEROL, DIRECT: Direct LDL: 172 mg/dL

## 2023-04-08 LAB — GLUCOSE, RANDOM: Glucose, Bld: 164 mg/dL — ABNORMAL HIGH (ref 70–99)

## 2023-04-08 LAB — TSH: TSH: 10.25 u[IU]/mL — ABNORMAL HIGH (ref 0.35–5.50)

## 2023-04-08 LAB — HEMOGLOBIN A1C: Hgb A1c MFr Bld: 8.3 % — ABNORMAL HIGH (ref 4.6–6.5)

## 2023-04-08 MED ORDER — METHYLPREDNISOLONE 4 MG PO TBPK: ORAL_TABLET | ORAL | 0 refills | Status: DC

## 2023-04-08 NOTE — Progress Notes (Signed)
Subjective:  Patient ID: Amy Lester, female    DOB: [birth year],  MRN: [id number]  Chief Complaint  Patient presents with   Ankle Pain    RIGHT ANKLE & JOINT PAIN x 4 weeks. Patient stumbles. Feels tight.  / LEFT SIDE ANKLE PAIN    52 y.o. female presents with concern for severe right ankle pain.  She also notes pain around the outside of the right ankle.  Also having some instability of the left ankle does previously had that surgically reconstructed at [facility] numerous years ago.  She says she thinks the left ankle is irritated because she has been favoring it to offload the right side.  Having difficulty walking at this time.  She was previously seen at [facility] and they gave her a brace which she says is not helping.  She has not yet been to physical therapy.  No MRI has been completed.  She was told at [facility] to come to try foot center for further evaluation.  Patient does have a history of MS.  Past Medical History:  Diagnosis Date   Asthma    Blood transfusion without reported diagnosis    Chronic migraine    Common migraine with intractable migraine 02/16/2019   Diabetes mellitus without complication (HCC)    Endometriosis    Fatty liver    GERD (gastroesophageal reflux disease)    Hyperlipidemia    Hypertension    Hypothyroidism    Incontinence    Irregular heartbeat    Multiple sclerosis (HCC)    Palpitations    Postconcussive syndrome 05/09/2020   Pre-diabetes    Sleep apnea     Allergies  Allergen Reactions   Crestor [Rosuvastatin]     myalgia   Lipitor [Atorvastatin]     myalgia   Percocet [Oxycodone-Acetaminophen] Nausea And Vomiting   Zocor [Simvastatin]     myalgia    ROS: Negative except as per HPI above  Objective:  General: AAO x3, NAD  Dermatological: With inspection and palpation of the right and left lower extremities there are no open sores, no preulcerative lesions, no rash or signs of infection present. Nails are of normal length  thickness and coloration.   Vascular:  Dorsalis Pedis artery and Posterior Tibial artery pedal pulses are 2/4 bilateral.  Capillary fill time < 3 sec to all digits.   Neruologic: Grossly intact via light touch bilateral. Protective threshold intact to all sites bilateral.   Musculoskeletal: Pain along the course of the peroneal tendons on the right ankle.  Edema noted on the right ankle.  Patient feels that it is unstable giving out on her.  Gait: Unassisted, antalgic  No images are attached to the encounter.  Radiographs:  Date: 04/08/2023 XR both feet Weightbearing AP/Lateral/Oblique   Findings: Significant osseous spurring at the posterior and plantar aspect of the heel on the right foot. Assessment:   1. Peroneal tendinitis of right lower leg   2. Bone spur of right foot   3. Capsulitis of ankle, unspecified laterality   4. Capsulitis of foot, left   5. Capsulitis of foot      Plan:  Patient was evaluated and treated and all questions answered.  # Peroneal tendinitis of right ankle -Discussed with patient that I believe her primary issues related to peroneal tendinitis on the right ankle -Recommend an MRI to further evaluate the problem.  Ordered MRI for any pain hospital -eRx for methylprednisolone 4 mg steroid taper pack take as directed for 6 days to  decrease pain and inflammation of the right ankle -Recommend cam boot immobilization on the right side as she failed brace.  Dispensed cam boot to the patient for right ankle use at any time when ambulating for the next month. -Discussed that if the MRI comes back with any abnormality would recommend returning to [facility] to see Dr. Victorino Dike for further evaluation and treatment with possible surgical intervention per their evaluation -Referral to physical therapy as well for right ankle strength stretching and anti-inflammatory modalities  No follow-ups on file.          Corinna Gab, DPM [facility] /  [facility]

## 2023-04-15 ENCOUNTER — Ambulatory Visit: Payer: Medicaid Other | Admitting: Endocrinology

## 2023-04-22 ENCOUNTER — Telehealth: Payer: Self-pay

## 2023-04-22 NOTE — Telephone Encounter (Signed)
I left a message for the patient to return my call.

## 2023-04-22 NOTE — Telephone Encounter (Signed)
-----   Message from [place] Thapa sent at 04/19/2023  9:56 AM EDT ----- Please inform the patient of labs reviewed TSH is improving, after increasing dose of levothyroxine at the end of June.  Not enough time to normalize TSH.  She is currently taking levothyroxine 2 tablets of 112 mcg daily.  No change in the dose continue the same, will review with patient in follow-up visit in September and repeat lab on the day of visit.  Cholesterol level and hemoglobin A1c reviewed.  Will discuss with patient at upcoming follow-up visit in September.

## 2023-04-26 NOTE — Progress Notes (Deleted)
NEUROLOGY FOLLOW UP OFFICE NOTE  JEILYN SLEDGE [id number]  Assessment/Plan:   1.  Chronic migraine without aura, without status migrainosus, not intractable 2.  History of multiple sclerosis - In absence of lesions in the spinal cord, I recommend lumbar puncture for CSF analysis supporting diagnosis.  She is hesitant.  However, she was diagnosed with MS in the clinical setting at [facility], so me may be able to obtain those notes with the workup performed.      1.  Migraine prevention:  *** 2.  Migraine rescue:  She will use rizatriptan alone.  Advised to discontinue sumatriptan and OTC NSAIDs and analgesics. *** 3. Limit use of pain relievers to no more than 2 days out of week to prevent risk of rebound or medication-overuse headache. 4.  Will try to obtain records from [facility].  If not possible, I stressed that we would need to perform LP. 5.  Follow up 5 months or sooner pending review of medical records.   Subjective:  NELIA HOSP is a 52 year old left-handed female with HTN, type 2 diabetes, HLD, hypothyroidism and chronic migraines who follows up for migraines and multiple sclerosis.  UPDATE: Switched from Aimovig to Ajovy.  ***  Frequency of pain relievers:  daily (takes triptan 3-4 days a week) *** Current NSAIDS/analgesics:  Ibuprofen, naproxen *** Current triptans:  Maxalt 10mg , sumatriptan *** Current ergotamine:  none Current anti-emetic:  Zofran Current muscle relaxants:  none Current Antihypertensive medications:  Lopressor, lisinopril Current Antidepressant medications:  Venlafaxine XR 75mg  QD (anxiety), Lexapro 20mg  Current Anticonvulsant medications:  zonisamide 100mg   Current anti-CGRP:  Ajovy ***  Current Vitamins/Herbal/Supplements:  Magnesium, riboflavin,  melatonin, B12 Current Antihistamines/Decongestants:  Zyrtec Other therapy:  none Hormone/birth control:  Estradiol, levothyroxine   HISTORY: She reports that she was diagnosed with multiple sclerosis  around 2020 where she participated in a study at [place].  She has no available medical records regarding specifics of her diagnosis and treatment.  She presented with generalized muscle weakness, hemisensory loss, needed a wheelchair.  She was diagnosed with MRI and LP.  She was on several medications but does not know what they were.  She moved to [facility] in 2020 and established care with neurologist, Dr. Lesia Sago.  MRI of brain with and without contrast on 03/16/2019 showed scattered T2/FLAIR hyperintense foci in the bilateral cerebral hemispheres which appeared nonspecific.  MRI of cervical spine with and without contrast same day showed some disc protrusions but no abnormality of the cervical spinal cord.  Based on these nonspecific findings, and because patient declined lumbar puncture, definite diagnosis of multiple sclerosis could not be established.  She is ambulatory but feels weak in extremities.  She has fallen twice.  She sometimes has trouble with fine-finger movements. Symptoms fluctuate, on for two to three months and improves.  When symptoms improve, never at previous baseline.  It occurs more than once a year.  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  MRI of cervical spine revealed no convincing cord signal abnormality or enhancement but did show similar mild to moderate canal stenosis at C5-C6 and C6-C7 with posterior discs contacting and deforming the ventral cord, similar mild canal stenosis at C7-T1, and similar mild to moderate bilateral foraminal stenosis at C6-C7.  Recommended lumbar puncture, however she was uncomfortable about going through  with it.     She has had migraines since the mid-1990s.  They are severe frontal/vertex pounding/shooting headache.  Associated nausea,  photophobia, phonophobia and sometimes vomits.  They usually last all day and occur 4-5 days a week (they were daily prior to Aimovig).     Past NSAIDS/analgesics:  Ketorolac 10mg  Past abortive triptans:  Frova Past abortive ergotamine:  none Past muscle relaxants:  none Past anti-emetic:  none Past antihypertensive medications:  HCTZ Past antidepressant medications:  amitriptyline Past anticonvulsant medications:  Topiramate (upset stomach), gabapentin Past anti-CGRP:  Aimovig 140mg  (ineffective, uncontrolled blood pressure) *** Past vitamins/Herbal/Supplements:  none Past antihistamines/decongestants:  none Other past therapies:  None.  Does not want to try Botox.    PAST MEDICAL HISTORY: Past Medical History:  Diagnosis Date   Asthma    Blood transfusion without reported diagnosis    Chronic migraine    Common migraine with intractable migraine 02/16/2019   Diabetes mellitus without complication (HCC)    Endometriosis    Fatty liver    GERD (gastroesophageal reflux disease)    Hyperlipidemia    Hypertension    Hypothyroidism    Incontinence    Irregular heartbeat    Multiple sclerosis (HCC)    Palpitations    Postconcussive syndrome 05/09/2020   Pre-diabetes    Sleep apnea     MEDICATIONS: Current Outpatient Medications on File Prior to Visit  Medication Sig Dispense Refill   acetaminophen (TYLENOL) 500 MG tablet Take 500 mg by mouth as needed for moderate pain.     albuterol (PROVENTIL) (2.5 MG/3ML) 0.083% nebulizer solution Take 3 mLs (2.5 mg total) by nebulization every 6 (six) hours as needed for wheezing or shortness of breath. 75 mL 3   albuterol (VENTOLIN HFA) 108 (90 Base) MCG/ACT inhaler Inhale 1-2 puffs into the lungs every 6 (six) hours as needed for wheezing or shortness of breath. 18 g 0   aspirin EC 81 MG tablet Take 1 tablet (81 mg total) by mouth daily. Swallow whole.     busPIRone (BUSPAR) 7.5 MG tablet Take 7.5 mg by mouth 2 (two) times daily as needed.      calcium carbonate (TUMS - DOSED IN MG ELEMENTAL CALCIUM) 500 MG chewable tablet Chew 2-3 tablets by mouth daily as needed for indigestion or heartburn.     cetirizine (ZYRTEC) 10 MG tablet Take 1 tablet (10 mg total) by mouth daily. 90 tablet 0   ciprofloxacin (CILOXAN) 0.3 % ophthalmic solution Place into both eyes daily.     clotrimazole-betamethasone (LOTRISONE) cream Apply 1 application topically as needed. (Patient taking differently: Apply 1 application  topically as needed (Outbreak).) 45 g 2   Continuous Glucose Receiver (FREESTYLE LIBRE 3 READER) DEVI Use to monitor blood sugar 1 each 0   Continuous Glucose Sensor (FREESTYLE LIBRE 3 SENSOR) MISC 1 Device by Does not apply route every 14 (fourteen) days. Apply 1 sensor on upper arm every 14 days for continuous glucose monitoring 2 each 2   Cyanocobalamin (B-12 COMPLIANCE INJECTION IJ) Inject 1 Dose as directed every 30 (thirty) days.     escitalopram (LEXAPRO) 20 MG tablet Take 1 tablet by mouth daily.     Evolocumab (REPATHA) 140 MG/ML SOSY Inject contents of pen twice a month in abdominal skin 2 mL 5   ezetimibe (ZETIA) 10 MG tablet Take 1 tablet (10 mg total) by mouth daily. 90 tablet 3   fluticasone (FLONASE) 50 MCG/ACT nasal spray Place 1 spray into both nostrils 2 (two)  times daily. 16 g 2   Fremanezumab-vfrm (AJOVY) 225 MG/1.5ML SOAJ Inject 225 mg into the skin every 28 (twenty-eight) days. 1.68 mL 11   ibuprofen (ADVIL) 200 MG tablet Take 600-800 mg by mouth as needed for mild pain or moderate pain.     insulin degludec (TRESIBA FLEXTOUCH) 200 UNIT/ML FlexTouch Pen Inject 24 units twice daily (Patient taking differently: Inject 25 units twice daily) 9 mL 1   levothyroxine (SYNTHROID) 112 MCG tablet 2 tablets daily before breakfast 180 tablet 1   levothyroxine (SYNTHROID) 200 MCG tablet Take 1 tablet (200 mcg total) by mouth daily. 90 tablet 3   lisinopril (ZESTRIL) 5 MG tablet Take 1 tablet (5 mg total) by mouth daily. 90 tablet  1   MAGNESIUM OXIDE PO Take 1 tablet by mouth daily.     MELATONIN PO Take 1 capsule by mouth at bedtime as needed (sleep).     methylPREDNISolone (MEDROL DOSEPAK) 4 MG TBPK tablet Take as directed for 6 days 1 each 0   metoprolol tartrate (LOPRESSOR) 25 MG tablet Take 1 tablet (25 mg total) by mouth in the morning. 90 tablet 3   metoprolol tartrate (LOPRESSOR) 50 MG tablet Take 1 tablet (50 mg total) by mouth at bedtime. 90 tablet 3   metroNIDAZOLE (FLAGYL) 500 MG tablet Take 1 tablet (500 mg total) by mouth 2 (two) times daily. 14 tablet 0   metroNIDAZOLE (METROGEL) 1 % gel Apply 1 application topically at bedtime.      montelukast (SINGULAIR) 10 MG tablet Take 10 mg by mouth every morning.     Multiple Vitamins-Minerals (MULTIVITAMIN WITH MINERALS) tablet Take 1 tablet by mouth daily.     nitroGLYCERIN (NITROSTAT) 0.4 MG SL tablet Place 1 tablet (0.4 mg total) under the tongue every 5 (five) minutes as needed for chest pain. 25 tablet 3   nystatin cream (MYCOSTATIN) Apply 1 application topically daily as needed for dry skin.     Olopatadine HCl (PAZEO OP) Place 1 drop into both eyes daily.     omeprazole (PRILOSEC) 40 MG capsule Take 1 capsule (40 mg total) by mouth 2 (two) times daily before a meal. 60 capsule 3   ondansetron (ZOFRAN) 4 MG tablet Take 1 tablet (4 mg total) by mouth every 8 (eight) hours as needed for nausea or vomiting. (Patient taking differently: Take 4 mg by mouth as needed for nausea or vomiting.) 20 tablet 1   ondansetron (ZOFRAN) 8 MG tablet Take 1 tablet (8 mg total) by mouth every 8 (eight) hours as needed for nausea or vomiting. 20 tablet 1   predniSONE (DELTASONE) 20 MG tablet Take 2 tablets (40 mg total) by mouth daily with breakfast. 10 tablet 0   promethazine-dextromethorphan (PROMETHAZINE-DM) 6.25-15 MG/5ML syrup Take 5 mLs by mouth 4 (four) times daily as needed. 100 mL 0   Red Yeast Rice Extract (RED YEAST RICE PO) Take 1 tablet by mouth daily.      Respiratory  Therapy Supplies (CARETOUCH CPAP & BIPAP HOSE) MISC by Does not apply route. CPAP Titration @ 16 for pressure.     Riboflavin (VITAMIN B-2 PO) Take 1 tablet by mouth daily.      rizatriptan (MAXALT) 10 MG tablet Take 1 tablet (10 mg total) by mouth 3 (three) times daily as needed for migraine. (Patient taking differently: Take 10 mg by mouth as needed for migraine.) 10 tablet 5   Semaglutide, 2 MG/DOSE, (OZEMPIC, 2 MG/DOSE,) 8 MG/3ML SOPN Inject 2 mg weekly 3 mL 1  Tretinoin, Facial Wrinkles, (RENOVA) 0.02 % CREA Apply 1 application topically at bedtime.     triamcinolone cream (KENALOG) 0.1 % Apply 1 application topically 2 (two) times daily as needed. (Patient taking differently: Apply 1 application  topically 2 (two) times daily as needed (rash).) 45 g 0   triamterene-hydrochlorothiazide (MAXZIDE-25) 37.5-25 MG tablet Take 1 tablet by mouth daily. 90 tablet 3   venlafaxine XR (EFFEXOR XR) 75 MG 24 hr capsule Take 1 capsule (75 mg total) by mouth daily with breakfast. 30 capsule 5   Vitamin D, Ergocalciferol, (DRISDOL) 1.25 MG (50000 UNIT) CAPS capsule Take 1 capsule (50,000 Units total) by mouth every 7 (seven) days. 12 capsule 0   zonisamide (ZONEGRAN) 100 MG capsule Take 2 capsules (200 mg total) by mouth daily. 60 capsule 5   No current facility-administered medications on file prior to visit.    ALLERGIES: Allergies  Allergen Reactions   Crestor [Rosuvastatin]     myalgia   Lipitor [Atorvastatin]     myalgia   Percocet [Oxycodone-Acetaminophen] Nausea And Vomiting   Zocor [Simvastatin]     myalgia    FAMILY HISTORY: Family History  Problem Relation Age of Onset   Heart attack Mother    Stroke Mother    Cancer Mother        breast cancer   Hyperlipidemia Mother    Hypothyroidism Mother    Cancer Father        lymph nodes, liver cancer   Colon cancer Father        early 24s   Other Daughter        Fine Gold Type II Syndrome and cognetive issues   Memory loss Daughter     Other Son        Fine Gold Syndrome and BPES   Diabetes Son 22       IDDM   Hypothyroidism Sister    Cancer Sister    Hyperthyroidism Neg Hx       Objective:  *** General: No acute distress.  Patient appears well-groomed.   Head:  Normocephalic/atraumatic Eyes:  Fundi examined but not visualized Neck: supple, no paraspinal tenderness, full range of motion Heart:  Regular rate and rhythm Neurological Exam: ***   Shon Millet, DO  CC: Coral Ceo, FNP

## 2023-04-27 ENCOUNTER — Ambulatory Visit: Payer: Medicaid Other | Admitting: Neurology

## 2023-05-03 ENCOUNTER — Encounter: Payer: Self-pay | Admitting: Dietician

## 2023-05-03 ENCOUNTER — Encounter: Payer: Medicaid Other | Attending: Endocrinology | Admitting: Dietician

## 2023-05-03 VITALS — Wt 225.0 lb

## 2023-05-03 DIAGNOSIS — E119 Type 2 diabetes mellitus without complications: Secondary | ICD-10-CM | POA: Insufficient documentation

## 2023-05-03 DIAGNOSIS — Z794 Long term (current) use of insulin: Secondary | ICD-10-CM | POA: Diagnosis present

## 2023-05-03 NOTE — Patient Instructions (Addendum)
Armchair exercises, resistance bands.  Physical therapy   Consider making a vegetable dip with greek yogurt and ranch powder Reduce your intake of total fat (particularly ghee and butter)  Snack away from the TV  Aim to take your thyroid 1 hour before your breakfast if able  Lower calorie density foods.

## 2023-05-03 NOTE — Progress Notes (Signed)
Diabetes Self-Management Education  Visit Type: First/Initial  Appt. Start Time: 1020 Appt. End Time: 1130  05/10/2023  [name], identified by name and date of birth, is a 52 y.o. female with a diagnosis of Diabetes: Type 2.   ASSESSMENT Patient is here today alone.  She was last seen by this RD on 11/23/2022. States that she is feeling more tired.   Her thyroid medication was changed to generic 2 months ago. She is eating more "junk" food. Unable to exercise as much due to her foot issues, neuropathy, MS She needs a total foot reconstruction. Weight gain which patient mentions. States that she is always hungry. Her A1C is higher.  Fasting glucose 170 She is not taking her Ozempic now due to concerns of side effects and now inability to get this so is now taking [place]. Sleep - some stress causing issues over the past 2 weeks but better with C-pap, sleeps about 10 hours per night  History includes:  Type 2 Diabetes 2018, neuropathy MS, chronic headaches, fatty liver, endometriosis, HTN, HLD, OSA on C-pap, hypothyroid, GERD Medications include  Ozempic (currently off), and Tresiba U-200 25 units bid Magnesium oxide, monthly vitamin B-12 injection, melatonin, red yeast rice, vitamin D, vitamin B2 (riboflavin (occasionally), Lovaza A1C 8.3% 04/08/2023 and 6.7% 10/29/20 increased from 6.1% 07/15/2020, 9.5% 02/18/2021 Lipid Panel     Component Value Date/Time   CHOL 261 (H) 03/04/2023 1216   CHOL 244 (H) 08/27/2020 1113   TRIG 171.0 (H) 03/04/2023 1216   HDL 48.10 03/04/2023 1216   HDL 48 08/27/2020 1113   CHOLHDL 5 03/04/2023 1216   VLDL 34.2 03/04/2023 1216   LDLCALC 178 (H) 03/04/2023 1216   LDLCALC 159 (H) 08/27/2020 1113   LDLDIRECT 172.0 04/08/2023 1032   LABVLDL 37 08/27/2020 1113  CGM:  FreeStyle Libre 3  Weight hx: 64" 225 lbs 05/03/2023 217 lbs 11/22/2020 221 lbs 10/29/20 210 lbs 05/09/2020 lbs 06/14/2020 204 lbs July 2021 230's highest adult weight  (2016) 140  lowest adult weight (30's)   Body Composition Scale Jun 14, 2020  Current Body Weight 213.8 lbs  Total Body Fat % 42.4  Visceral Fat 14  Fat-Free Mass % 57.5   Total Body Water % 43.2  Muscle-Mass lbs 30.1  BMI    Body Fat Displacement           Torso  lbs 56         Left Leg  lbs 11.2         Right Leg  lbs 11.2         Left Arm  lbs 5.6         Right Arm   lbs 5.6    Patient lives with her husband (32 yo and starting to have dementia and has diabetes) and adult daughter who is disabled.   She does the cooking and will eat what she makes.   They moved from [place] 2019. Dislikes going outside. Decreased strength in hands, more migraines from MS.  Used to be in a wheel chair but was able to walk after an [employer] treatment. Unable to exercise due to her feet. Used to swim in the past. Family meals Eats to try to get energy.  Weight 225 lb (102.1 kg). Body mass index is 38.62 kg/m.   Diabetes Self-Management Education - 05/03/23 1058       Visit Information   Visit Type First/Initial      Initial Visit   Diabetes Type  Type 2    Date Diagnosed 2018    Are you currently following a meal plan? No    Are you taking your medications as prescribed? Yes      Health Coping   How would you rate your overall health? Good      Psychosocial Assessment   Patient Belief/Attitude about Diabetes Defeat/Burnout    What is the hardest part about your diabetes right now, causing you the most concern, or is the most worrisome to you about your diabetes?   Being active;Other (comment)   always hungry   Self-care barriers None    Self-management support Doctor's office;CDE visits;Family    Other persons present Patient    Patient Concerns Nutrition/Meal planning    Special Needs None    Preferred Learning Style No preference indicated    Learning Readiness Ready    How often do you need to have someone help you when you read instructions, pamphlets, or other written materials from  your doctor or pharmacy? 1 - Never    What is the last grade level you completed in school? 14      Pre-Education Assessment   Patient understands the diabetes disease and treatment process. Needs Review    Patient understands incorporating nutritional management into lifestyle. Needs Review    Patient undertands incorporating physical activity into lifestyle. Needs Review    Patient understands using medications safely. Needs Review    Patient understands monitoring blood glucose, interpreting and using results Needs Review    Patient understands prevention, detection, and treatment of acute complications. Needs Review    Patient understands prevention, detection, and treatment of chronic complications. Needs Review    Patient understands how to develop strategies to address psychosocial issues. Needs Review    Patient understands how to develop strategies to promote health/change behavior. Needs Review      Complications   Last HgB A1C per patient/outside source 8.3 %   04/08/2023   How often do you check your blood sugar? > 4 times/day    Fasting Blood glucose range (mg/dL) 213-086;57-846    Postprandial Blood glucose range (mg/dL) 962-952;>841    Number of hyperglycemic episodes ( >200mg /dL): Daily    Can you tell when your blood sugar is high? Yes    What do you do if your blood sugar is high? nothing    Have you had a dilated eye exam in the past 12 months? Yes    Have you had a dental exam in the past 12 months? Yes      Dietary Intake   Breakfast 2 1/2 cups oatmeal with craisins, flax seeds, chia seeds, apples    Snack (morning) banana and handful of cheerios    Lunch [place] sandwich on white sourdough, potato chips, cucumber slices, strawberry puree    Snack (afternoon) snicker's bar, doritto's, carrots, plum    Dinner chicken, noodles, green beans, bite of croisant    Snack (evening) popcorn with salt and butter, roasted cheerios, ice cream bar, gum drops    Beverage(s) water,  rare sip of juice or soda      Activity / Exercise   Activity / Exercise Type ADL's      Patient Education   Previous Diabetes Education Yes (please comment)   11/2022   Healthy Eating Meal options for control of blood glucose level and chronic complications.    Medications Reviewed patients medication for diabetes, action, purpose, timing of dose and side effects.    Diabetes Stress  and Support Identified and addressed patients feelings and concerns about diabetes;Worked with patient to identify barriers to care and solutions      Individualized Goals (developed by patient)   Nutrition General guidelines for healthy choices and portions discussed    Physical Activity Exercise 3-5 times per week;30 minutes per day    Monitoring  Test my blood glucose as discussed      Post-Education Assessment   Patient understands the diabetes disease and treatment process. Demonstrates understanding / competency    Patient understands incorporating nutritional management into lifestyle. Comprehends key points    Patient undertands incorporating physical activity into lifestyle. Demonstrates understanding / competency    Patient understands using medications safely. Demonstrates understanding / competency    Patient understands monitoring blood glucose, interpreting and using results Demonstrates understanding / competency    Patient understands prevention, detection, and treatment of acute complications. Demonstrates understanding / competency    Patient understands prevention, detection, and treatment of chronic complications. Demonstrates understanding / competency    Patient understands how to develop strategies to address psychosocial issues. Comprehends key points    Patient understands how to develop strategies to promote health/change behavior. Comprehends key points      Outcomes   Future DMSE 3-4 months    Program Status Not Completed             Individualized Plan for Diabetes  Self-Management Training:   Learning Objective:  Patient will have a greater understanding of diabetes self-management. Patient education plan is to attend individual and/or group sessions per assessed needs and concerns.   Plan:   Patient Instructions  Armchair exercises, resistance bands.  Physical therapy   Consider making a vegetable dip with greek yogurt and ranch powder Reduce your intake of total fat (particularly ghee and butter)  Snack away from the TV  Aim to take your thyroid 1 hour before your breakfast if able  Lower calorie density foods.  Expected Outcomes:     Education material provided:   If problems or questions, patient to contact team via:  Phone  Future DSME appointment: 3-4 months

## 2023-05-04 ENCOUNTER — Ambulatory Visit (HOSPITAL_COMMUNITY): Payer: Medicaid Other

## 2023-05-12 ENCOUNTER — Encounter: Payer: Self-pay | Admitting: Endocrinology

## 2023-05-12 ENCOUNTER — Ambulatory Visit: Payer: Medicaid Other | Admitting: Endocrinology

## 2023-05-12 VITALS — BP 120/85 | HR 76 | Ht 64.0 in | Wt 224.6 lb

## 2023-05-12 DIAGNOSIS — E782 Mixed hyperlipidemia: Secondary | ICD-10-CM | POA: Diagnosis not present

## 2023-05-12 DIAGNOSIS — Z794 Long term (current) use of insulin: Secondary | ICD-10-CM

## 2023-05-12 DIAGNOSIS — E063 Autoimmune thyroiditis: Secondary | ICD-10-CM | POA: Diagnosis not present

## 2023-05-12 DIAGNOSIS — E1165 Type 2 diabetes mellitus with hyperglycemia: Secondary | ICD-10-CM

## 2023-05-12 MED ORDER — GLIMEPIRIDE 2 MG PO TABS: 2.0000 mg | ORAL_TABLET | Freq: Every day | ORAL | 3 refills | Status: DC

## 2023-05-12 MED ORDER — PRAVASTATIN SODIUM 10 MG PO TABS: 10.0000 mg | ORAL_TABLET | Freq: Every day | ORAL | 3 refills | Status: DC

## 2023-05-12 MED ORDER — LEVOTHYROXINE SODIUM 125 MCG PO TABS: 250.0000 ug | ORAL_TABLET | Freq: Every day | ORAL | 3 refills | Status: DC

## 2023-05-12 NOTE — Patient Instructions (Addendum)
Continue tresiba 26 units two times a day. Start glimepiride 2mg  daily.   Pravastatin for lipid.

## 2023-05-12 NOTE — Progress Notes (Signed)
Outpatient Endocrinology Note [place] Zohar Maroney, MD  05/13/23  Patient's Name: Amy Lester    DOB: 1971-07-08    MRN: [id number]                                                    REASON OF VISIT: Follow up for type 2 diabetes mellitus  PCP: Deeann Saint, MD  HISTORY OF PRESENT ILLNESS:   Amy Lester is a 52 y.o. old female with past medical history listed below, is here for follow up of type 2 diabetes mellitus.  Patient was last seen by Dr. Lucianne Muss in June 2024.  Pertinent Diabetes History: Patient was diagnosed with type 2 diabetes mellitus in 2018.  She had prediabetes prior to diagnosis of diabetes mellitus.  She denied having gestational diabetes with her 3 pregnancies.  She had tried metformin in the beginning however had abdominal discomfort and stopped it.  Chronic Diabetes Complications : Retinopathy: no. Last ophthalmology exam was done on annually, reportedly. Nephropathy: no, on lisinopril. Peripheral neuropathy: no Coronary artery disease: no Stroke: no  Relevant comorbidities and cardiovascular risk factors: Obesity: yes Body mass index is 38.55 kg/m.  Hypertension: yes Hyperlipidemia. Yes.  Side effects from different statin, Lipitor caused upper body muscle ache, does not want to take it.  Currently on Zetia /fish oil.  Per chart review she had tried simvastatin in the past had muscle ache and is stopped.  Patient reports she also had side effect from rosuvastatin/Crestor in the past.  Repatha denied by medical insurance and she does not like to take anyway.  Current / Home Diabetic regimen includes: Tresiba 26 units 2 times a day.  Prior diabetic medications: Metformin stopped due to GI intolerance/abdominal discomfort. Ozempic stopped by patient due to concern with side effects and lawsuits. Glipizide in the past.  Glycemic data:   No glucometer data to review.  By recall same insulin fasting blood sugar 1 20-130 range and in the afternoon blood sugar  up to 220-230 range.  Hypoglycemia: Patient has no hypoglycemic episodes. Patient has hypoglycemia awareness.  Factors modifying glucose control: 1.  Diabetic diet assessment: 3 meals a day.  2.  Staying active or exercising: No formal exercise  3.  Medication compliance: compliant most of the time.  # Hypothyroidism -Strong family history of hypothyroidism.  She was diagnosed with hypothyroidism 25+ years ago.  She has mostly been on levothyroxine/Synthroid.  She had tried levothyroxine/Cytomel in the past.  Levothyroxine dose was adjusted periodically in the past.  Interval history 05/13/23 Patient stopped Ozempic few weeks ago due to concern of side effects and lawsuits.  She is currently taking [place] 26 units 2 times a day.  She is also concerned about her cholesterol.  Her Repatha was not covered and she does not like to take anyway due to possible side effects.  She has been taking levothyroxine 112 mcg 2 tablets daily, reviewed and taking in the morning and empty stomach before breakfast, reports compliance.  Recent TSH is still elevated and improved from 30 in June to 10 recently.  REVIEW OF SYSTEMS As per history of present illness.   PAST MEDICAL HISTORY: Past Medical History:  Diagnosis Date   Asthma    Blood transfusion without reported diagnosis    Chronic migraine    Common migraine with intractable migraine  02/16/2019   Diabetes mellitus without complication (HCC)    Endometriosis    Fatty liver    GERD (gastroesophageal reflux disease)    Hyperlipidemia    Hypertension    Hypothyroidism    Incontinence    Irregular heartbeat    Multiple sclerosis (HCC)    Palpitations    Postconcussive syndrome 05/09/2020   Pre-diabetes    Sleep apnea     PAST SURGICAL HISTORY: Past Surgical History:  Procedure Laterality Date   ABDOMINAL HYSTERECTOMY     CESAREAN SECTION     x1   CHOLECYSTECTOMY     COLONOSCOPY WITH PROPOFOL N/A 11/04/2020   Surgeon: Corbin Ade, MD; normal exam.  Repeat in 10 years.   ENDOMETRIAL ABLATION     ESOPHAGOGASTRODUODENOSCOPY (EGD) WITH PROPOFOL N/A 11/04/2020   Surgeon: Corbin Ade, MD; normal exam.   FOOT SURGERY Left 06/2018   foot reconstruction   REPLACEMENT TOTAL KNEE Left     ALLERGIES: Allergies  Allergen Reactions   Crestor [Rosuvastatin]     myalgia   Lipitor [Atorvastatin]     myalgia   Percocet [Oxycodone-Acetaminophen] Nausea And Vomiting   Zocor [Simvastatin]     myalgia    FAMILY HISTORY:  Family History  Problem Relation Age of Onset   Heart attack Mother    Stroke Mother    Cancer Mother        breast cancer   Hyperlipidemia Mother    Hypothyroidism Mother    Cancer Father        lymph nodes, liver cancer   Colon cancer Father        early 9s   Other Daughter        Fine Gold Type II Syndrome and cognetive issues   Memory loss Daughter    Other Son        Fine Gold Syndrome and BPES   Diabetes Son 22       IDDM   Hypothyroidism Sister    Cancer Sister    Hyperthyroidism Neg Hx     SOCIAL HISTORY: Social History   Socioeconomic History   Marital status: Married    Spouse name: Not on file   Number of children: Not on file   Years of education: 14   Highest education level: Not on file  Occupational History   Not on file  Tobacco Use   Smoking status: Never    Passive exposure: Past   Smokeless tobacco: Never  Vaping Use   Vaping status: Never Used  Substance and Sexual Activity   Alcohol use: Yes    Comment: once a year   Drug use: Never   Sexual activity: Not Currently  Other Topics Concern   Not on file  Social History Narrative   Left handed    Caffeine 1 daily    Lives at home with husband and adult daughter   One story home   Social Determinants of Health   Financial Resource Strain: Not on file  Food Insecurity: Not on file  Transportation Needs: Not on file  Physical Activity: Not on file  Stress: Not on file  Social  Connections: Not on file    MEDICATIONS:  Current Outpatient Medications  Medication Sig Dispense Refill   acetaminophen (TYLENOL) 500 MG tablet Take 500 mg by mouth as needed for moderate pain.     albuterol (PROVENTIL) (2.5 MG/3ML) 0.083% nebulizer solution Take 3 mLs (2.5 mg total) by nebulization every 6 (six) hours as  needed for wheezing or shortness of breath. 75 mL 3   albuterol (VENTOLIN HFA) 108 (90 Base) MCG/ACT inhaler Inhale 1-2 puffs into the lungs every 6 (six) hours as needed for wheezing or shortness of breath. 18 g 0   aspirin EC 81 MG tablet Take 1 tablet (81 mg total) by mouth daily. Swallow whole.     busPIRone (BUSPAR) 7.5 MG tablet Take 7.5 mg by mouth 2 (two) times daily as needed.     calcium carbonate (TUMS - DOSED IN MG ELEMENTAL CALCIUM) 500 MG chewable tablet Chew 2-3 tablets by mouth daily as needed for indigestion or heartburn.     cetirizine (ZYRTEC) 10 MG tablet Take 1 tablet (10 mg total) by mouth daily. 90 tablet 0   ciprofloxacin (CILOXAN) 0.3 % ophthalmic solution Place into both eyes daily.     clotrimazole-betamethasone (LOTRISONE) cream Apply 1 application topically as needed. (Patient taking differently: Apply 1 application  topically as needed (Outbreak).) 45 g 2   Continuous Glucose Receiver (FREESTYLE LIBRE 3 READER) DEVI Use to monitor blood sugar 1 each 0   Continuous Glucose Sensor (FREESTYLE LIBRE 3 SENSOR) MISC 1 Device by Does not apply route every 14 (fourteen) days. Apply 1 sensor on upper arm every 14 days for continuous glucose monitoring 2 each 2   Cyanocobalamin (B-12 COMPLIANCE INJECTION IJ) Inject 1 Dose as directed every 30 (thirty) days.     escitalopram (LEXAPRO) 20 MG tablet Take 1 tablet by mouth daily.     Evolocumab (REPATHA) 140 MG/ML SOSY Inject contents of pen twice a month in abdominal skin 2 mL 5   ezetimibe (ZETIA) 10 MG tablet Take 1 tablet (10 mg total) by mouth daily. 90 tablet 3   fluticasone (FLONASE) 50 MCG/ACT nasal  spray Place 1 spray into both nostrils 2 (two) times daily. 16 g 2   Fremanezumab-vfrm (AJOVY) 225 MG/1.5ML SOAJ Inject 225 mg into the skin every 28 (twenty-eight) days. 1.68 mL 11   glimepiride (AMARYL) 2 MG tablet Take 1 tablet (2 mg total) by mouth daily before breakfast. 90 tablet 3   ibuprofen (ADVIL) 200 MG tablet Take 600-800 mg by mouth as needed for mild pain or moderate pain.     insulin degludec (TRESIBA FLEXTOUCH) 200 UNIT/ML FlexTouch Pen Inject 24 units twice daily (Patient taking differently: Inject 25 units twice daily) 9 mL 1   MAGNESIUM OXIDE PO Take 1 tablet by mouth daily.     MELATONIN PO Take 1 capsule by mouth at bedtime as needed (sleep).     methylPREDNISolone (MEDROL DOSEPAK) 4 MG TBPK tablet Take as directed for 6 days 1 each 0   metoprolol tartrate (LOPRESSOR) 25 MG tablet Take 1 tablet (25 mg total) by mouth in the morning. 90 tablet 3   metoprolol tartrate (LOPRESSOR) 50 MG tablet Take 1 tablet (50 mg total) by mouth at bedtime. 90 tablet 3   metroNIDAZOLE (METROGEL) 1 % gel Apply 1 application topically at bedtime.      montelukast (SINGULAIR) 10 MG tablet Take 10 mg by mouth every morning.     Multiple Vitamins-Minerals (MULTIVITAMIN WITH MINERALS) tablet Take 1 tablet by mouth daily.     nitroGLYCERIN (NITROSTAT) 0.4 MG SL tablet Place 1 tablet (0.4 mg total) under the tongue every 5 (five) minutes as needed for chest pain. 25 tablet 3   nystatin cream (MYCOSTATIN) Apply 1 application topically daily as needed for dry skin.     Olopatadine HCl (PAZEO OP) Place 1 drop  into both eyes daily.     omega-3 acid ethyl esters (LOVAZA) 1 g capsule Take 1 g by mouth 2 (two) times daily.     omeprazole (PRILOSEC) 40 MG capsule Take 1 capsule (40 mg total) by mouth 2 (two) times daily before a meal. 60 capsule 3   ondansetron (ZOFRAN) 4 MG tablet Take 1 tablet (4 mg total) by mouth every 8 (eight) hours as needed for nausea or vomiting. (Patient taking differently: Take 4 mg  by mouth as needed for nausea or vomiting.) 20 tablet 1   ondansetron (ZOFRAN) 8 MG tablet Take 1 tablet (8 mg total) by mouth every 8 (eight) hours as needed for nausea or vomiting. 20 tablet 1   pravastatin (PRAVACHOL) 10 MG tablet Take 1 tablet (10 mg total) by mouth daily. 90 tablet 3   predniSONE (DELTASONE) 20 MG tablet Take 2 tablets (40 mg total) by mouth daily with breakfast. 10 tablet 0   Red Yeast Rice Extract (RED YEAST RICE PO) Take 1 tablet by mouth daily.      Respiratory Therapy Supplies (CARETOUCH CPAP & BIPAP HOSE) MISC by Does not apply route. CPAP Titration @ 16 for pressure.     Riboflavin (VITAMIN B-2 PO) Take 1 tablet by mouth daily.      Tretinoin, Facial Wrinkles, (RENOVA) 0.02 % CREA Apply 1 application topically at bedtime.     triamcinolone cream (KENALOG) 0.1 % Apply 1 application topically 2 (two) times daily as needed. (Patient taking differently: Apply 1 application  topically 2 (two) times daily as needed (rash).) 45 g 0   triamterene-hydrochlorothiazide (MAXZIDE-25) 37.5-25 MG tablet Take 1 tablet by mouth daily. 90 tablet 3   venlafaxine XR (EFFEXOR XR) 75 MG 24 hr capsule Take 1 capsule (75 mg total) by mouth daily with breakfast. 30 capsule 5   Vitamin D, Ergocalciferol, (DRISDOL) 1.25 MG (50000 UNIT) CAPS capsule Take 1 capsule (50,000 Units total) by mouth every 7 (seven) days. 12 capsule 0   zonisamide (ZONEGRAN) 100 MG capsule Take 2 capsules (200 mg total) by mouth daily. 60 capsule 5   levothyroxine (SYNTHROID) 125 MCG tablet Take 2 tablets (250 mcg total) by mouth daily before breakfast. 2 tablets daily before breakfast 180 tablet 3   lisinopril (ZESTRIL) 5 MG tablet Take 1 tablet (5 mg total) by mouth daily. (Patient not taking: Reported on 05/03/2023) 90 tablet 1   metroNIDAZOLE (FLAGYL) 500 MG tablet Take 1 tablet (500 mg total) by mouth 2 (two) times daily. (Patient not taking: Reported on 05/03/2023) 14 tablet 0   promethazine-dextromethorphan  (PROMETHAZINE-DM) 6.25-15 MG/5ML syrup Take 5 mLs by mouth 4 (four) times daily as needed. (Patient not taking: Reported on 05/03/2023) 100 mL 0   rizatriptan (MAXALT) 10 MG tablet Take 1 tablet (10 mg total) by mouth 3 (three) times daily as needed for migraine. (Patient taking differently: Take 10 mg by mouth as needed for migraine.) 10 tablet 5   No current facility-administered medications for this visit.    PHYSICAL EXAM: Vitals:   05/12/23 1556  BP: 120/85  Pulse: 76  SpO2: 97%  Weight: 224 lb 9.6 oz (101.9 kg)  Height: 5\' 4"  (1.626 m)   Body mass index is 38.55 kg/m.  Wt Readings from Last 3 Encounters:  05/12/23 224 lb 9.6 oz (101.9 kg)  05/03/23 225 lb (102.1 kg)  03/04/23 226 lb (102.5 kg)    General: Well developed, well nourished female in no apparent distress.  HEENT: AT/[place], no external  lesions.  Eyes: Conjunctiva clear and no icterus. Neck: Neck supple  Lungs: Respirations not labored Neurologic: Alert, oriented, normal speech Extremities / Skin: Dry. No sores or rashes noted. Dry skin.  Psychiatric: Does not appear depressed or anxious  Diabetic Foot Exam - Simple   No data filed    LABS Reviewed Lab Results  Component Value Date   HGBA1C 8.3 (H) 04/08/2023   HGBA1C 8.0 (A) 01/19/2023   HGBA1C 7.8 (H) 10/15/2021   No results found for: "FRUCTOSAMINE" Lab Results  Component Value Date   CHOL 261 (H) 03/04/2023   HDL 48.10 03/04/2023   LDLCALC 178 (H) 03/04/2023   LDLDIRECT 172.0 04/08/2023   TRIG 171.0 (H) 03/04/2023   CHOLHDL 5 03/04/2023   Lab Results  Component Value Date   MICRALBCREAT 7.0 04/07/2023   MICRALBCREAT 0.5 03/04/2023   Lab Results  Component Value Date   CREATININE 0.83 03/04/2023   Lab Results  Component Value Date   GFR 80.34 03/04/2023    ASSESSMENT / PLAN  1. Uncontrolled type 2 diabetes mellitus with hyperglycemia, with long-term current use of insulin (HCC)   2. Mixed hyperlipidemia   3. Acquired autoimmune  hypothyroidism     Diabetes Mellitus type 2, complicated by no known complications. - Diabetic status / severity: Uncontrolled  Lab Results  Component Value Date   HGBA1C 8.3 (H) 04/08/2023   Discussed about type 2 diabetes mellitus and importance of better control to avoid chronic diabetic complications.  Patient does not want to go back on Ozempic or GLP-1 receptor agonist due to potential side effects.  She has complaints of occasional vaginal candidiasis, I would like to avoid SGLT2 inhibitor at this time.  - Hemoglobin A1c goal : <7%  - Medications: See below  I) continue Tresiba 26 units 2 times a day. II) start glimepiride 2 mg daily.  - Home glucose testing: 3-4 times a day before meals and at bedtime.  Discussed about continuous glucose monitoring, will consider in the future. - Discussed/ Gave Hypoglycemia treatment plan.  # Consult : not required at this time.  # Annual urine for microalbuminuria/ creatinine ratio, no microalbuminuria currently. Last  Lab Results  Component Value Date   MICRALBCREAT 7.0 04/07/2023    # Foot check nightly.  # Annual dilated diabetic eye exams.   - Diet: Make healthy diabetic food choices - Life style / activity / exercise: Discussed.  2. Blood pressure  -  BP Readings from Last 1 Encounters:  05/12/23 120/85    - Control is in target.  - No change in current plans.  3. Lipid status / Hyperlipidemia - Last  Lab Results  Component Value Date   LDLCALC 178 (H) 03/04/2023   -Patient reports had side effect mainly muscle ache with atorvastatin, simvastatin and ? rosuvastatin.  Repatha was prescribed in the last visit however not covered by insurance.  She does not want to take Repatha anyway.  She want to try statin therapy. -Start pravastatin 10 mg daily. -Check fasting lipid panel in 3 months.  # Primary hypothyroidism : -Currently taking levothyroxine 112 mcg 2 tablets daily.  Dose was increased when TSH was 30 in June.   Recent TSH improved however is still elevated to 10.  She reports compliance.  She still has complaints of fatigue.  She reports that when she was on Synthroid used to have regulated and normal thyroid function test and thyroid hormone levels.  With the generic levothyroxine she has not been feeling  good. -Increase levothyroxine/Synthroid to 125 mcg 2 tablets daily.  Will prescribe branded Synthroid. -Check TSH, free T4 in 3 months.  Diagnoses and all orders for this visit:  Uncontrolled type 2 diabetes mellitus with hyperglycemia, with long-term current use of insulin (HCC) -     Hemoglobin A1c; Future  Mixed hyperlipidemia -     Lipid panel; Future  Acquired autoimmune hypothyroidism -     T4, free; Future -     TSH; Future  Other orders -     levothyroxine (SYNTHROID) 125 MCG tablet; Take 2 tablets (250 mcg total) by mouth daily before breakfast. 2 tablets daily before breakfast -     glimepiride (AMARYL) 2 MG tablet; Take 1 tablet (2 mg total) by mouth daily before breakfast. -     pravastatin (PRAVACHOL) 10 MG tablet; Take 1 tablet (10 mg total) by mouth daily.    DISPOSITION Follow up in clinic in 3  months suggested.   All questions answered and patient verbalized understanding of the plan.  [place] Gerad Cornelio, MD [facility] End[facility] [address] [place], [address] [place], [place] [address] Phone # [phone]  At least part of this note was generated using voice recognition software. Inadvertent word errors may have occurred, which were not recognized during the proofreading process.

## 2023-05-13 ENCOUNTER — Encounter: Payer: Self-pay | Admitting: Endocrinology

## 2023-06-10 ENCOUNTER — Ambulatory Visit: Payer: Medicaid Other | Admitting: Endocrinology

## 2023-06-10 ENCOUNTER — Telehealth: Payer: Self-pay | Admitting: Internal Medicine

## 2023-06-10 ENCOUNTER — Encounter: Payer: Self-pay | Admitting: Endocrinology

## 2023-06-10 ENCOUNTER — Ambulatory Visit (HOSPITAL_COMMUNITY): Admission: RE | Admit: 2023-06-10 | Payer: Medicaid Other | Source: Ambulatory Visit

## 2023-06-10 VITALS — BP 140/90 | HR 103 | Ht 64.0 in | Wt 219.6 lb

## 2023-06-10 DIAGNOSIS — E063 Autoimmune thyroiditis: Secondary | ICD-10-CM

## 2023-06-10 DIAGNOSIS — E1165 Type 2 diabetes mellitus with hyperglycemia: Secondary | ICD-10-CM

## 2023-06-10 DIAGNOSIS — Z794 Long term (current) use of insulin: Secondary | ICD-10-CM | POA: Diagnosis not present

## 2023-06-10 DIAGNOSIS — E782 Mixed hyperlipidemia: Secondary | ICD-10-CM

## 2023-06-10 MED ORDER — GLIMEPIRIDE 4 MG PO TABS: 4.0000 mg | ORAL_TABLET | Freq: Every day | ORAL | 3 refills | Status: DC

## 2023-06-10 MED ORDER — LEVOTHYROXINE SODIUM 125 MCG PO TABS: 250.0000 ug | ORAL_TABLET | Freq: Every day | ORAL | 3 refills | Status: DC

## 2023-06-10 MED ORDER — TRESIBA FLEXTOUCH 200 UNIT/ML ~~LOC~~ SOPN: 28.0000 [IU] | PEN_INJECTOR | Freq: Two times a day (BID) | SUBCUTANEOUS | Status: DC

## 2023-06-10 MED ORDER — PRAVASTATIN SODIUM 10 MG PO TABS: 10.0000 mg | ORAL_TABLET | Freq: Every day | ORAL | 3 refills | Status: DC

## 2023-06-10 MED ORDER — TRESIBA FLEXTOUCH 200 UNIT/ML ~~LOC~~ SOPN: 32.0000 [IU] | PEN_INJECTOR | Freq: Two times a day (BID) | SUBCUTANEOUS | 3 refills | Status: AC

## 2023-06-10 NOTE — Patient Instructions (Addendum)
Increase tresiba 32 units two times a day. Start glimepiride 4 mg daily.   Pravastatin for lipid.   Call in 1 week to review glucose.

## 2023-06-10 NOTE — Progress Notes (Signed)
Outpatient Endocrinology Note [place] Chilton Sallade, MD  06/10/23  Patient's Name: Amy Lester    DOB: [birth year]    MRN: [id number]                                                    REASON OF VISIT: Follow up for type 2 diabetes mellitus  PCP: Deeann Saint, MD  HISTORY OF PRESENT ILLNESS:   Amy Lester is a 52 y.o. old female with past medical history listed below, is here for follow up of type 2 diabetes mellitus.    Pertinent Diabetes History: Patient was diagnosed with type 2 diabetes mellitus in 2018.  She had prediabetes prior to diagnosis of diabetes mellitus.  She denied having gestational diabetes with her 3 pregnancies.  She had tried metformin in the beginning however had abdominal discomfort and stopped it.  Chronic Diabetes Complications : Retinopathy: no. Last ophthalmology exam was done on annually, reportedly. Nephropathy: no, on lisinopril. Peripheral neuropathy: no Coronary artery disease: no Stroke: no  Relevant comorbidities and cardiovascular risk factors: Obesity: yes Body mass index is 37.69 kg/m.  Hypertension: yes Hyperlipidemia. Yes.  Side effects from different statin, Lipitor caused upper body muscle ache, does not want to take it.  Currently on Zetia /fish oil.  Per chart review she had tried simvastatin in the past had muscle ache and is stopped.  Patient reports she also had side effect from rosuvastatin/Crestor in the past.  Repatha denied by medical insurance and she does not like to take anyway.  Current / Home Diabetic regimen includes: Tresiba 26 units 2 times a day.  Prior diabetic medications: Metformin stopped due to GI intolerance/abdominal discomfort. Ozempic stopped by patient due to concern with side effects and lawsuits. Glipizide in the past.  Glycemic data:   Glucometer data directly reviewed from the meter.  She is having mostly blood sugar in 300s as follows.  Checked at different times of the day including in the early  morning.  This is a new meter only available records are from last 2 days.  She did not bring her previous glucometer. 303, 236, 289, 383, 382, 389, 376,   Hypoglycemia: Patient has no hypoglycemic episodes. Patient has hypoglycemia awareness.  Factors modifying glucose control: 1.  Diabetic diet assessment: 3 meals a day.  2.  Staying active or exercising: No formal exercise  3.  Medication compliance: compliant most of the time.  # Hypothyroidism -Strong family history of hypothyroidism.  She was diagnosed with hypothyroidism 25+ years ago.  She has mostly been on levothyroxine/Synthroid.  She had tried levothyroxine/Cytomel in the past.  Levothyroxine dose was adjusted periodically in the past.  Interval history 06/10/23 Patient presented today for the evaluation of significant hyperglycemia.  She is mostly having blood sugar in 300s.  She has noticed significant hyperglycemia for last 1 week.  She double checked with different meter if there is any fault in the meter and also checked with her husband blood sugar and daughter's blood sugar they were normal.  With review of glucometer she is mostly having blood sugar in 300s.  No hypoglycemia.  Glucometer her other normal blood sugar of 154 and 110, she said these glucose normal are of her husband and daughter.  She was not able to refill the glimepiride that was prescribed in last office  visit.  She has been taking [place] 26 units 2 times a day.  She mentioned her physical activity has been somewhat less lately however her eating has been about the same.  No other illness.  She denies fever, cough or chest pain.  She thinks she has of personal yeast infection has had took one of the feel the other day.  She denies dysuria however she is having increased urinary frequency.  She has been taking levothyroxine/Synthroid 112 mcg 2 tablets daily.  Prescription for increased dose of Synthroid 125 mcg 2 tablets daily was sent in last visit,  patient states she was not able to refill.  REVIEW OF SYSTEMS As per history of present illness.   PAST MEDICAL HISTORY: Past Medical History:  Diagnosis Date   Asthma    Blood transfusion without reported diagnosis    Chronic migraine    Common migraine with intractable migraine 02/16/2019   Diabetes mellitus without complication (HCC)    Endometriosis    Fatty liver    GERD (gastroesophageal reflux disease)    Hyperlipidemia    Hypertension    Hypothyroidism    Incontinence    Irregular heartbeat    Multiple sclerosis (HCC)    Palpitations    Postconcussive syndrome 05/09/2020   Pre-diabetes    Sleep apnea     PAST SURGICAL HISTORY: Past Surgical History:  Procedure Laterality Date   ABDOMINAL HYSTERECTOMY     CESAREAN SECTION     x1   CHOLECYSTECTOMY     COLONOSCOPY WITH PROPOFOL N/A 11/04/2020   Surgeon: Corbin Ade, MD; normal exam.  Repeat in 10 years.   ENDOMETRIAL ABLATION     ESOPHAGOGASTRODUODENOSCOPY (EGD) WITH PROPOFOL N/A 11/04/2020   Surgeon: Corbin Ade, MD; normal exam.   FOOT SURGERY Left 06/2018   foot reconstruction   REPLACEMENT TOTAL KNEE Left     ALLERGIES: Allergies  Allergen Reactions   Crestor [Rosuvastatin]     myalgia   Lipitor [Atorvastatin]     myalgia   Percocet [Oxycodone-Acetaminophen] Nausea And Vomiting   Zocor [Simvastatin]     myalgia    FAMILY HISTORY:  Family History  Problem Relation Age of Onset   Heart attack Mother    Stroke Mother    Cancer Mother        breast cancer   Hyperlipidemia Mother    Hypothyroidism Mother    Cancer Father        lymph nodes, liver cancer   Colon cancer Father        early 88s   Other Daughter        Fine Gold Type II Syndrome and cognetive issues   Memory loss Daughter    Other Son        Fine Gold Syndrome and BPES   Diabetes Son 22       IDDM   Hypothyroidism Sister    Cancer Sister    Hyperthyroidism Neg Hx     SOCIAL HISTORY: Social History    Socioeconomic History   Marital status: Married    Spouse name: Not on file   Number of children: Not on file   Years of education: 14   Highest education level: Not on file  Occupational History   Not on file  Tobacco Use   Smoking status: Never    Passive exposure: Past   Smokeless tobacco: Never  Vaping Use   Vaping status: Never Used  Substance and Sexual Activity   Alcohol use:  Yes    Comment: once a year   Drug use: Never   Sexual activity: Not Currently  Other Topics Concern   Not on file  Social History Narrative   Left handed    Caffeine 1 daily    Lives at home with husband and adult daughter   One story home   Social Determinants of Health   Financial Resource Strain: Not on file  Food Insecurity: Not on file  Transportation Needs: Not on file  Physical Activity: Not on file  Stress: Not on file  Social Connections: Not on file    MEDICATIONS:  Current Outpatient Medications  Medication Sig Dispense Refill   acetaminophen (TYLENOL) 500 MG tablet Take 500 mg by mouth as needed for moderate pain.     albuterol (PROVENTIL) (2.5 MG/3ML) 0.083% nebulizer solution Take 3 mLs (2.5 mg total) by nebulization every 6 (six) hours as needed for wheezing or shortness of breath. 75 mL 3   albuterol (VENTOLIN HFA) 108 (90 Base) MCG/ACT inhaler Inhale 1-2 puffs into the lungs every 6 (six) hours as needed for wheezing or shortness of breath. 18 g 0   aspirin EC 81 MG tablet Take 1 tablet (81 mg total) by mouth daily. Swallow whole.     busPIRone (BUSPAR) 7.5 MG tablet Take 7.5 mg by mouth 2 (two) times daily as needed.     calcium carbonate (TUMS - DOSED IN MG ELEMENTAL CALCIUM) 500 MG chewable tablet Chew 2-3 tablets by mouth daily as needed for indigestion or heartburn.     cetirizine (ZYRTEC) 10 MG tablet Take 1 tablet (10 mg total) by mouth daily. 90 tablet 0   ciprofloxacin (CILOXAN) 0.3 % ophthalmic solution Place into both eyes daily.      clotrimazole-betamethasone (LOTRISONE) cream Apply 1 application topically as needed. (Patient taking differently: Apply 1 application  topically as needed (Outbreak).) 45 g 2   Continuous Glucose Receiver (FREESTYLE LIBRE 3 READER) DEVI Use to monitor blood sugar 1 each 0   Continuous Glucose Sensor (FREESTYLE LIBRE 3 SENSOR) MISC 1 Device by Does not apply route every 14 (fourteen) days. Apply 1 sensor on upper arm every 14 days for continuous glucose monitoring 2 each 2   Cyanocobalamin (B-12 COMPLIANCE INJECTION IJ) Inject 1 Dose as directed every 30 (thirty) days.     escitalopram (LEXAPRO) 20 MG tablet Take 1 tablet by mouth daily.     Evolocumab (REPATHA) 140 MG/ML SOSY Inject contents of pen twice a month in abdominal skin 2 mL 5   ezetimibe (ZETIA) 10 MG tablet Take 1 tablet (10 mg total) by mouth daily. 90 tablet 3   fluticasone (FLONASE) 50 MCG/ACT nasal spray Place 1 spray into both nostrils 2 (two) times daily. 16 g 2   ibuprofen (ADVIL) 200 MG tablet Take 600-800 mg by mouth as needed for mild pain or moderate pain.     MAGNESIUM OXIDE PO Take 1 tablet by mouth daily.     MELATONIN PO Take 1 capsule by mouth at bedtime as needed (sleep).     methylPREDNISolone (MEDROL DOSEPAK) 4 MG TBPK tablet Take as directed for 6 days 1 each 0   metoprolol tartrate (LOPRESSOR) 25 MG tablet Take 1 tablet (25 mg total) by mouth in the morning. 90 tablet 3   metoprolol tartrate (LOPRESSOR) 50 MG tablet Take 1 tablet (50 mg total) by mouth at bedtime. 90 tablet 3   metroNIDAZOLE (METROGEL) 1 % gel Apply 1 application topically at bedtime.  montelukast (SINGULAIR) 10 MG tablet Take 10 mg by mouth every morning.     Multiple Vitamins-Minerals (MULTIVITAMIN WITH MINERALS) tablet Take 1 tablet by mouth daily.     nitroGLYCERIN (NITROSTAT) 0.4 MG SL tablet Place 1 tablet (0.4 mg total) under the tongue every 5 (five) minutes as needed for chest pain. 25 tablet 3   nystatin cream (MYCOSTATIN) Apply 1  application topically daily as needed for dry skin.     Olopatadine HCl (PAZEO OP) Place 1 drop into both eyes daily.     omega-3 acid ethyl esters (LOVAZA) 1 g capsule Take 1 g by mouth 2 (two) times daily.     omeprazole (PRILOSEC) 40 MG capsule Take 1 capsule (40 mg total) by mouth 2 (two) times daily before a meal. 60 capsule 3   ondansetron (ZOFRAN) 4 MG tablet Take 1 tablet (4 mg total) by mouth every 8 (eight) hours as needed for nausea or vomiting. (Patient taking differently: Take 4 mg by mouth as needed for nausea or vomiting.) 20 tablet 1   ondansetron (ZOFRAN) 8 MG tablet Take 1 tablet (8 mg total) by mouth every 8 (eight) hours as needed for nausea or vomiting. 20 tablet 1   predniSONE (DELTASONE) 20 MG tablet Take 2 tablets (40 mg total) by mouth daily with breakfast. 10 tablet 0   Red Yeast Rice Extract (RED YEAST RICE PO) Take 1 tablet by mouth daily.      Respiratory Therapy Supplies (CARETOUCH CPAP & BIPAP HOSE) MISC by Does not apply route. CPAP Titration @ 16 for pressure.     Riboflavin (VITAMIN B-2 PO) Take 1 tablet by mouth daily.      rizatriptan (MAXALT) 10 MG tablet Take 1 tablet (10 mg total) by mouth 3 (three) times daily as needed for migraine. (Patient taking differently: Take 10 mg by mouth as needed for migraine.) 10 tablet 5   Tretinoin, Facial Wrinkles, (RENOVA) 0.02 % CREA Apply 1 application topically at bedtime.     triamcinolone cream (KENALOG) 0.1 % Apply 1 application topically 2 (two) times daily as needed. (Patient taking differently: Apply 1 application  topically 2 (two) times daily as needed (rash).) 45 g 0   triamterene-hydrochlorothiazide (MAXZIDE-25) 37.5-25 MG tablet Take 1 tablet by mouth daily. 90 tablet 3   Vitamin D, Ergocalciferol, (DRISDOL) 1.25 MG (50000 UNIT) CAPS capsule Take 1 capsule (50,000 Units total) by mouth every 7 (seven) days. 12 capsule 0   zonisamide (ZONEGRAN) 100 MG capsule Take 2 capsules (200 mg total) by mouth daily. 60 capsule  5   Fremanezumab-vfrm (AJOVY) 225 MG/1.5ML SOAJ Inject 225 mg into the skin every 28 (twenty-eight) days. 1.68 mL 11   glimepiride (AMARYL) 4 MG tablet Take 1 tablet (4 mg total) by mouth daily before breakfast. 90 tablet 3   insulin degludec (TRESIBA FLEXTOUCH) 200 UNIT/ML FlexTouch Pen Inject 32 Units into the skin in the morning and at bedtime. 30 mL 3   levothyroxine (SYNTHROID) 125 MCG tablet Take 2 tablets (250 mcg total) by mouth daily before breakfast. 2 tablets daily before breakfast 180 tablet 3   lisinopril (ZESTRIL) 5 MG tablet Take 1 tablet (5 mg total) by mouth daily. (Patient not taking: Reported on 05/03/2023) 90 tablet 1   metroNIDAZOLE (FLAGYL) 500 MG tablet Take 1 tablet (500 mg total) by mouth 2 (two) times daily. (Patient not taking: Reported on 05/03/2023) 14 tablet 0   pravastatin (PRAVACHOL) 10 MG tablet Take 1 tablet (10 mg total) by mouth  daily. 90 tablet 3   promethazine-dextromethorphan (PROMETHAZINE-DM) 6.25-15 MG/5ML syrup Take 5 mLs by mouth 4 (four) times daily as needed. (Patient not taking: Reported on 05/03/2023) 100 mL 0   venlafaxine XR (EFFEXOR XR) 75 MG 24 hr capsule Take 1 capsule (75 mg total) by mouth daily with breakfast. 30 capsule 5   No current facility-administered medications for this visit.    PHYSICAL EXAM: Vitals:   06/10/23 1410  BP: (!) 140/90  Pulse: (!) 103  SpO2: 98%  Weight: 219 lb 9.6 oz (99.6 kg)  Height: 5\' 4"  (1.626 m)   Body mass index is 37.69 kg/m.  Wt Readings from Last 3 Encounters:  06/10/23 219 lb 9.6 oz (99.6 kg)  05/12/23 224 lb 9.6 oz (101.9 kg)  05/03/23 225 lb (102.1 kg)    General: Well developed, well nourished female in no apparent distress.  HEENT: AT/[place], no external lesions.  Eyes: Conjunctiva clear and no icterus. Neck: Neck supple  Lungs: Respirations not labored Neurologic: Alert, oriented, normal speech Extremities / Skin: Dry. No sores or rashes noted.   Psychiatric: Does not appear depressed or  anxious  Diabetic Foot Exam - Simple   No data filed    LABS Reviewed Lab Results  Component Value Date   HGBA1C 8.3 (H) 04/08/2023   HGBA1C 8.0 (A) 01/19/2023   HGBA1C 7.8 (H) 10/15/2021   No results found for: "FRUCTOSAMINE" Lab Results  Component Value Date   CHOL 261 (H) 03/04/2023   HDL 48.10 03/04/2023   LDLCALC 178 (H) 03/04/2023   LDLDIRECT 172.0 04/08/2023   TRIG 171.0 (H) 03/04/2023   CHOLHDL 5 03/04/2023   Lab Results  Component Value Date   MICRALBCREAT 7.0 04/07/2023   MICRALBCREAT 0.5 03/04/2023   Lab Results  Component Value Date   CREATININE 0.83 03/04/2023   Lab Results  Component Value Date   GFR 80.34 03/04/2023    ASSESSMENT / PLAN  1. Uncontrolled type 2 diabetes mellitus with hyperglycemia, with long-term current use of insulin (HCC)   2. Acquired autoimmune hypothyroidism   3. Mixed hyperlipidemia     Diabetes Mellitus type 2, complicated by no known complications. - Diabetic status / severity: Uncontrolled  Lab Results  Component Value Date   HGBA1C 8.3 (H) 04/08/2023   Discussed about type 2 diabetes mellitus and importance of better control to avoid chronic diabetic complications.  Patient does not want to go back on Ozempic or GLP-1 receptor agonist due to potential side effects.  She has complaints of occasional vaginal candidiasis, I would like to avoid SGLT2 inhibitor at this time.  Discussed that she is having hyperglycemia in the 300s, no other known illness at this time.  This is  probably related with not enough diabetic medication.  She is only on the basal insulin.  She was not able to refill the glimepiride that was prescribed in last visit.  - Hemoglobin A1c goal : <7%  - Medications: See below  I) increase Tresiba from 26 units to 32 units 2 times a day. II) Start glimepiride 4 mg daily.  - Home glucose testing: 3-4 times a day before meals and at bedtime.  Discussed about continuous glucose monitoring, will consider  in the future.  Patient is asked to call in 1 week to review the blood sugar.  She will come with her husband's follow-up visit and also like to discuss blood sugar at that time.  - Discussed/ Gave Hypoglycemia treatment plan.  # Consult : not required  at this time.  # Annual urine for microalbuminuria/ creatinine ratio, no microalbuminuria currently. Last  Lab Results  Component Value Date   MICRALBCREAT 7.0 04/07/2023    # Foot check nightly.  # Annual dilated diabetic eye exams.   - Diet: Make healthy diabetic food choices - Life style / activity / exercise: Discussed.  2. Blood pressure  -  BP Readings from Last 1 Encounters:  06/10/23 (!) 140/90    - Control is in target.  - No change in current plans.  3. Lipid status / Hyperlipidemia - Last  Lab Results  Component Value Date   LDLCALC 178 (H) 03/04/2023   -Patient reports had side effect mainly muscle ache with atorvastatin, simvastatin and ? rosuvastatin.  Repatha was prescribed in the last visit however not covered by insurance.  She does not want to take Repatha anyway.  She want to try statin therapy. -Start pravastatin 10 mg daily.  Prescription resent.  She has not started it. -Check fasting lipid panel in 2-3 months.  # Primary hypothyroidism : -Currently taking levothyroxine 112 mcg 2 tablets daily.  Dose was increased when TSH was 30 in June.  Recent TSH improved however is still elevated to 10.  She reports compliance.  She still has complaints of fatigue.  She reports that when she was on Synthroid used to have regulated and normal thyroid function test and thyroid hormone levels.  With the generic levothyroxine she has not been feeling good. -Increase levothyroxine/Synthroid to 125 mcg 2 tablets daily.  Will prescribe branded Synthroid.  Prescription resent. -Check TSH, free T4 in 2 months, to follow-up visit in November.  Diagnoses and all orders for this visit:  Uncontrolled type 2 diabetes mellitus  with hyperglycemia, with long-term current use of insulin (HCC)  Acquired autoimmune hypothyroidism  Mixed hyperlipidemia  Other orders -     insulin degludec (TRESIBA FLEXTOUCH) 200 UNIT/ML FlexTouch Pen; Inject 32 Units into the skin in the morning and at bedtime. -     glimepiride (AMARYL) 4 MG tablet; Take 1 tablet (4 mg total) by mouth daily before breakfast. -     levothyroxine (SYNTHROID) 125 MCG tablet; Take 2 tablets (250 mcg total) by mouth daily before breakfast. 2 tablets daily before breakfast -     pravastatin (PRAVACHOL) 10 MG tablet; Take 1 tablet (10 mg total) by mouth daily.     DISPOSITION Follow up in clinic in as scheduled in November 1.   All questions answered and patient verbalized understanding of the plan.  [place] Sylvain Hasten, MD [facility] Endocrinology [facility] [address] [place], [address] [place], [place] [address] Phone # [phone]  At least part of this note was generated using voice recognition software. Inadvertent word errors may have occurred, which were not recognized during the proofreading process.

## 2023-06-10 NOTE — Telephone Encounter (Signed)
Received a call from the oncall service on 06/09/2023 at 2130 regarding BG's > 300 mg    Patient is on Tresiba 25 units twice daily, patient was advised to increase Tresiba to 28 twice daily and to contact the office during working hours for persistent hyperglycemia      Abby Raelyn Mora, MD  [facility] Endocrinology  [facility] [address] [place], [place] [address] Phone: [phone] FAX: [phone]

## 2023-06-16 ENCOUNTER — Other Ambulatory Visit: Payer: Self-pay | Admitting: Endocrinology

## 2023-06-16 ENCOUNTER — Other Ambulatory Visit: Payer: Self-pay

## 2023-06-16 ENCOUNTER — Telehealth: Payer: Self-pay | Admitting: Endocrinology

## 2023-06-16 MED ORDER — LEVOTHYROXINE SODIUM 112 MCG PO TABS: 224.0000 ug | ORAL_TABLET | Freq: Every day | ORAL | 3 refills | Status: DC

## 2023-06-16 NOTE — Telephone Encounter (Signed)
Patient is here saying that :her  1.)  Levothyroxine levothyroxine (SYNTHROID) 125 MCG tablet was supposed to be increased to 225 mcg not to .   2.)Patient states that a second shrt acting insulin was supposed to be sent in, but that the pharmacy never received it. ??   glimepiride glimepiride (AMARYL) 4 MG tablet ??

## 2023-06-16 NOTE — Telephone Encounter (Signed)
Per Dr Nicholos Johns last note she is supposed to take Levothyroxine 125 mcg 2 times daily that equals daily and Glipizide 4 mg daily was sent to her pharmacy and approved , Dr Erroll Luna please advise?

## 2023-07-01 ENCOUNTER — Ambulatory Visit: Payer: Medicaid Other | Admitting: Dietician

## 2023-07-09 ENCOUNTER — Ambulatory Visit (INDEPENDENT_AMBULATORY_CARE_PROVIDER_SITE_OTHER): Payer: Medicaid Other | Admitting: Endocrinology

## 2023-07-09 ENCOUNTER — Encounter: Payer: Self-pay | Admitting: Endocrinology

## 2023-07-09 VITALS — BP 126/80 | HR 73 | Resp 20 | Ht 64.0 in | Wt 213.0 lb

## 2023-07-09 DIAGNOSIS — Z794 Long term (current) use of insulin: Secondary | ICD-10-CM | POA: Diagnosis not present

## 2023-07-09 DIAGNOSIS — E1165 Type 2 diabetes mellitus with hyperglycemia: Secondary | ICD-10-CM

## 2023-07-09 DIAGNOSIS — E669 Obesity, unspecified: Secondary | ICD-10-CM

## 2023-07-09 DIAGNOSIS — E063 Autoimmune thyroiditis: Secondary | ICD-10-CM | POA: Diagnosis not present

## 2023-07-09 LAB — POCT GLYCOSYLATED HEMOGLOBIN (HGB A1C): Hemoglobin A1C: 9.8 % — AB (ref 4.0–5.6)

## 2023-07-09 MED ORDER — INSULIN PEN NEEDLE 32G X 4 MM MISC: 3 refills | Status: AC

## 2023-07-09 MED ORDER — NOVOLOG FLEXPEN 100 UNIT/ML ~~LOC~~ SOPN: 8.0000 [IU] | PEN_INJECTOR | Freq: Three times a day (TID) | SUBCUTANEOUS | 4 refills | Status: DC

## 2023-07-09 NOTE — Progress Notes (Unsigned)
Outpatient Endocrinology Note [name], MD  07/10/23  Patient's Name: Amy Lester    DOB: [birth year]    MRN: [id number]                                                    REASON OF VISIT: Follow up for type 2 diabetes mellitus  PCP: Deeann Saint, MD  HISTORY OF PRESENT ILLNESS:   Amy Lester is a 52 y.o. old female with past medical history listed below, is here for follow up of type 2 diabetes mellitus /hypothyroidism.    Pertinent Diabetes History: Patient was diagnosed with type 2 diabetes mellitus in 2018.  She had prediabetes prior to diagnosis of diabetes mellitus.  She denied having gestational diabetes with her 3 pregnancies.  She had tried metformin in the beginning however had abdominal discomfort and stopped it.  Chronic Diabetes Complications : Retinopathy: no. Last ophthalmology exam was done on annually, reportedly. Nephropathy: no, on lisinopril. Peripheral neuropathy: no Coronary artery disease: no Stroke: no  Relevant comorbidities and cardiovascular risk factors: Obesity: yes Body mass index is 36.56 kg/m.  Hypertension: yes Hyperlipidemia. Yes.  Side effects from different statin, Lipitor caused upper body muscle ache, does not want to take it.  Currently on Zetia /fish oil.  Per chart review she had tried simvastatin in the past had muscle ache and is stopped.  Patient reports she also had side effect from rosuvastatin/Crestor in the past.  Repatha denied by medical insurance and she does not like to take anyway.  Current / Home Diabetic regimen includes: Tresiba 30 units in the morning and 32 units at bedtime. Glimepiride 4 mg daily.  Prior diabetic medications: Metformin stopped due to GI intolerance/abdominal discomfort. Ozempic stopped by patient due to concern with side effects and lawsuits. Glipizide in the past.  Glycemic data:   Glucose log reviewed, mostly hyperglycemia with blood sugar up to 300 range.  Blood sugar in the range  of 164-360.  She has been checking mostly in the morning and at bedtime and sometime in the afternoon.  182, 315, 223, 203, 223, 293, 226, 248, 164, 225, 289, 333, 182, 211, 211, 287, 290, 203, 259, 241, 236, 318, 164, 177, 328, 217, 360.   Hypoglycemia: Patient has no hypoglycemic episodes. Patient has hypoglycemia awareness.  Factors modifying glucose control: 1.  Diabetic diet assessment: 3 meals a day.  2.  Staying active or exercising: No formal exercise  3.  Medication compliance: compliant most of the time.  # Hypothyroidism -Strong family history of hypothyroidism.  She was diagnosed with hypothyroidism 25+ years ago.  She has mostly been on levothyroxine/Synthroid.  She had tried levothyroxine/Cytomel in the past.  Levothyroxine dose was adjusted periodically in the past.  Interval history  Patient continued to have significant hyperglycemia mostly in the range of 200-300.  She rarely has blood sugar below 200.  She reports compliance with [place] and glimepiride.  She complains of nausea or a stomach upset with the glimepiride hesitant to increase the dose.  She has been taking levothyroxine/Synthroid 112 mcg 2 tablets daily.  She is concerned about continued weight gain and would like to see weight management.   REVIEW OF SYSTEMS As per history of present illness.   PAST MEDICAL HISTORY: Past Medical History:  Diagnosis Date   Asthma  Blood transfusion without reported diagnosis    Chronic migraine    Common migraine with intractable migraine 02/16/2019   Diabetes mellitus without complication (HCC)    Endometriosis    Fatty liver    GERD (gastroesophageal reflux disease)    Hyperlipidemia    Hypertension    Hypothyroidism    Incontinence    Irregular heartbeat    Multiple sclerosis (HCC)    Palpitations    Postconcussive syndrome 05/09/2020   Pre-diabetes    Sleep apnea     PAST SURGICAL HISTORY: Past Surgical History:  Procedure Laterality Date    ABDOMINAL HYSTERECTOMY     CESAREAN SECTION     x1   CHOLECYSTECTOMY     COLONOSCOPY WITH PROPOFOL N/A 11/04/2020   Surgeon: Corbin Ade, MD; normal exam.  Repeat in 10 years.   ENDOMETRIAL ABLATION     ESOPHAGOGASTRODUODENOSCOPY (EGD) WITH PROPOFOL N/A 11/04/2020   Surgeon: Corbin Ade, MD; normal exam.   FOOT SURGERY Left 06/2018   foot reconstruction   REPLACEMENT TOTAL KNEE Left     ALLERGIES: Allergies  Allergen Reactions   Crestor [Rosuvastatin]     myalgia   Lipitor [Atorvastatin]     myalgia   Percocet [Oxycodone-Acetaminophen] Nausea And Vomiting   Zocor [Simvastatin]     myalgia    FAMILY HISTORY:  Family History  Problem Relation Age of Onset   Heart attack Mother    Stroke Mother    Cancer Mother        breast cancer   Hyperlipidemia Mother    Hypothyroidism Mother    Cancer Father        lymph nodes, liver cancer   Colon cancer Father        early 22s   Other Daughter        Fine Gold Type II Syndrome and cognetive issues   Memory loss Daughter    Other Son        Fine Gold Syndrome and BPES   Diabetes Son 22       IDDM   Hypothyroidism Sister    Cancer Sister    Hyperthyroidism Neg Hx     SOCIAL HISTORY: Social History   Socioeconomic History   Marital status: Married    Spouse name: Not on file   Number of children: Not on file   Years of education: 14   Highest education level: Not on file  Occupational History   Not on file  Tobacco Use   Smoking status: Never    Passive exposure: Past   Smokeless tobacco: Never  Vaping Use   Vaping status: Never Used  Substance and Sexual Activity   Alcohol use: Yes    Comment: once a year   Drug use: Never   Sexual activity: Not Currently  Other Topics Concern   Not on file  Social History Narrative   Left handed    Caffeine 1 daily    Lives at home with husband and adult daughter   One story home   Social Determinants of Health   Financial Resource Strain: Not on file   Food Insecurity: Not on file  Transportation Needs: Not on file  Physical Activity: Not on file  Stress: Not on file  Social Connections: Not on file    MEDICATIONS:  Current Outpatient Medications  Medication Sig Dispense Refill   acetaminophen (TYLENOL) 500 MG tablet Take 500 mg by mouth as needed for moderate pain.     albuterol (PROVENTIL) (  2.5 MG/3ML) 0.083% nebulizer solution Take 3 mLs (2.5 mg total) by nebulization every 6 (six) hours as needed for wheezing or shortness of breath. 75 mL 3   albuterol (VENTOLIN HFA) 108 (90 Base) MCG/ACT inhaler Inhale 1-2 puffs into the lungs every 6 (six) hours as needed for wheezing or shortness of breath. 18 g 0   aspirin EC 81 MG tablet Take 1 tablet (81 mg total) by mouth daily. Swallow whole.     busPIRone (BUSPAR) 7.5 MG tablet Take 7.5 mg by mouth 2 (two) times daily as needed.     calcium carbonate (TUMS - DOSED IN MG ELEMENTAL CALCIUM) 500 MG chewable tablet Chew 2-3 tablets by mouth daily as needed for indigestion or heartburn.     cetirizine (ZYRTEC) 10 MG tablet Take 1 tablet (10 mg total) by mouth daily. 90 tablet 0   ciprofloxacin (CILOXAN) 0.3 % ophthalmic solution Place into both eyes daily.     clotrimazole-betamethasone (LOTRISONE) cream Apply 1 application topically as needed. (Patient taking differently: Apply 1 application  topically as needed (Outbreak).) 45 g 2   Continuous Glucose Receiver (FREESTYLE LIBRE 3 READER) DEVI Use to monitor blood sugar 1 each 0   Continuous Glucose Sensor (FREESTYLE LIBRE 3 SENSOR) MISC 1 Device by Does not apply route every 14 (fourteen) days. Apply 1 sensor on upper arm every 14 days for continuous glucose monitoring 2 each 2   Cyanocobalamin (B-12 COMPLIANCE INJECTION IJ) Inject 1 Dose as directed every 30 (thirty) days.     escitalopram (LEXAPRO) 20 MG tablet Take 1 tablet by mouth daily.     Evolocumab (REPATHA) 140 MG/ML SOSY Inject contents of pen twice a month in abdominal skin 2 mL 5    ezetimibe (ZETIA) 10 MG tablet Take 1 tablet (10 mg total) by mouth daily. 90 tablet 3   fluticasone (FLONASE) 50 MCG/ACT nasal spray Place 1 spray into both nostrils 2 (two) times daily. 16 g 2   Fremanezumab-vfrm (AJOVY) 225 MG/1.5ML SOAJ Inject 225 mg into the skin every 28 (twenty-eight) days. 1.68 mL 11   glimepiride (AMARYL) 4 MG tablet Take 1 tablet (4 mg total) by mouth daily before breakfast. 90 tablet 3   ibuprofen (ADVIL) 200 MG tablet Take 600-800 mg by mouth as needed for mild pain or moderate pain.     insulin aspart (NOVOLOG FLEXPEN) 100 UNIT/ML FlexPen Inject 8 Units into the skin 3 (three) times daily with meals. 30 mL 4   insulin degludec (TRESIBA FLEXTOUCH) 200 UNIT/ML FlexTouch Pen Inject 32 Units into the skin in the morning and at bedtime. 30 mL 3   Insulin Pen Needle 32G X 4 MM MISC Use 5x a day 300 each 3   levothyroxine (SYNTHROID) 112 MCG tablet Take 2 tablets (224 mcg total) by mouth daily. 180 tablet 3   MAGNESIUM OXIDE PO Take 1 tablet by mouth daily.     MELATONIN PO Take 1 capsule by mouth at bedtime as needed (sleep).     methylPREDNISolone (MEDROL DOSEPAK) 4 MG TBPK tablet Take as directed for 6 days 1 each 0   metoprolol tartrate (LOPRESSOR) 25 MG tablet Take 1 tablet (25 mg total) by mouth in the morning. 90 tablet 3   metoprolol tartrate (LOPRESSOR) 50 MG tablet Take 1 tablet (50 mg total) by mouth at bedtime. 90 tablet 3   metroNIDAZOLE (FLAGYL) 500 MG tablet Take 1 tablet (500 mg total) by mouth 2 (two) times daily. 14 tablet 0   metroNIDAZOLE (METROGEL)  1 % gel Apply 1 application topically at bedtime.      montelukast (SINGULAIR) 10 MG tablet Take 10 mg by mouth every morning.     Multiple Vitamins-Minerals (MULTIVITAMIN WITH MINERALS) tablet Take 1 tablet by mouth daily.     nitroGLYCERIN (NITROSTAT) 0.4 MG SL tablet Place 1 tablet (0.4 mg total) under the tongue every 5 (five) minutes as needed for chest pain. 25 tablet 3   nystatin cream (MYCOSTATIN)  Apply 1 application topically daily as needed for dry skin.     Olopatadine HCl (PAZEO OP) Place 1 drop into both eyes daily.     omega-3 acid ethyl esters (LOVAZA) 1 g capsule Take 1 g by mouth 2 (two) times daily.     omeprazole (PRILOSEC) 40 MG capsule Take 1 capsule (40 mg total) by mouth 2 (two) times daily before a meal. 60 capsule 3   ondansetron (ZOFRAN) 4 MG tablet Take 1 tablet (4 mg total) by mouth every 8 (eight) hours as needed for nausea or vomiting. (Patient taking differently: Take 4 mg by mouth as needed for nausea or vomiting.) 20 tablet 1   ondansetron (ZOFRAN) 8 MG tablet Take 1 tablet (8 mg total) by mouth every 8 (eight) hours as needed for nausea or vomiting. 20 tablet 1   pravastatin (PRAVACHOL) 10 MG tablet Take 1 tablet (10 mg total) by mouth daily. 90 tablet 3   predniSONE (DELTASONE) 20 MG tablet Take 2 tablets (40 mg total) by mouth daily with breakfast. 10 tablet 0   promethazine-dextromethorphan (PROMETHAZINE-DM) 6.25-15 MG/5ML syrup Take 5 mLs by mouth 4 (four) times daily as needed. 100 mL 0   Red Yeast Rice Extract (RED YEAST RICE PO) Take 1 tablet by mouth daily.      Respiratory Therapy Supplies (CARETOUCH CPAP & BIPAP HOSE) MISC by Does not apply route. CPAP Titration @ 16 for pressure.     Riboflavin (VITAMIN B-2 PO) Take 1 tablet by mouth daily.      rizatriptan (MAXALT) 10 MG tablet Take 1 tablet (10 mg total) by mouth 3 (three) times daily as needed for migraine. (Patient taking differently: Take 10 mg by mouth as needed for migraine.) 10 tablet 5   Tretinoin, Facial Wrinkles, (RENOVA) 0.02 % CREA Apply 1 application topically at bedtime.     triamcinolone cream (KENALOG) 0.1 % Apply 1 application topically 2 (two) times daily as needed. (Patient taking differently: Apply 1 application  topically 2 (two) times daily as needed (rash).) 45 g 0   triamterene-hydrochlorothiazide (MAXZIDE-25) 37.5-25 MG tablet Take 1 tablet by mouth daily. 90 tablet 3   venlafaxine  XR (EFFEXOR XR) 75 MG 24 hr capsule Take 1 capsule (75 mg total) by mouth daily with breakfast. 30 capsule 5   Vitamin D, Ergocalciferol, (DRISDOL) 1.25 MG (50000 UNIT) CAPS capsule Take 1 capsule (50,000 Units total) by mouth every 7 (seven) days. 12 capsule 0   zonisamide (ZONEGRAN) 100 MG capsule Take 2 capsules (200 mg total) by mouth daily. 60 capsule 5   lisinopril (ZESTRIL) 5 MG tablet Take 1 tablet (5 mg total) by mouth daily. (Patient not taking: Reported on 05/03/2023) 90 tablet 1   No current facility-administered medications for this visit.    PHYSICAL EXAM: Vitals:   07/09/23 1011  BP: 126/80  Pulse: 73  Resp: 20  SpO2: 97%  Weight: 213 lb (96.6 kg)  Height: 5\' 4"  (1.626 m)    Body mass index is 36.56 kg/m.  Wt Readings from  Last 3 Encounters:  07/09/23 213 lb (96.6 kg)  06/10/23 219 lb 9.6 oz (99.6 kg)  05/12/23 224 lb 9.6 oz (101.9 kg)    General: Well developed, well nourished female in no apparent distress.  HEENT: AT/[place], no external lesions.  Eyes: Conjunctiva clear and no icterus. Neck: Neck supple  Lungs: Respirations not labored Neurologic: Alert, oriented, normal speech Extremities / Skin: Dry.   Psychiatric: Does not appear depressed or anxious  Diabetic Foot Exam - Simple   No data filed    LABS Reviewed Lab Results  Component Value Date   HGBA1C 9.8 (A) 07/09/2023   HGBA1C 8.3 (H) 04/08/2023   HGBA1C 8.0 (A) 01/19/2023   No results found for: "FRUCTOSAMINE" Lab Results  Component Value Date   CHOL 261 (H) 03/04/2023   HDL 48.10 03/04/2023   LDLCALC 178 (H) 03/04/2023   LDLDIRECT 172.0 04/08/2023   TRIG 171.0 (H) 03/04/2023   CHOLHDL 5 03/04/2023   Lab Results  Component Value Date   MICRALBCREAT 7.0 04/07/2023   MICRALBCREAT 0.5 03/04/2023   Lab Results  Component Value Date   CREATININE 0.83 03/04/2023   Lab Results  Component Value Date   GFR 80.34 03/04/2023    ASSESSMENT / PLAN  1. Uncontrolled type 2 diabetes  mellitus with hyperglycemia, with long-term current use of insulin (HCC)   2. Acquired autoimmune hypothyroidism   3. Obesity (BMI 30-39.9)      Diabetes Mellitus type 2, complicated by no known complications. - Diabetic status / severity: Uncontrolled  Lab Results  Component Value Date   HGBA1C 9.8 (A) 07/09/2023   Discussed about type 2 diabetes mellitus and importance of better control to avoid chronic diabetic complications.  Patient does not want to go back on Ozempic or GLP-1 receptor agonist due to potential side effects.  She has complaints of occasional vaginal candidiasis, I would like to avoid SGLT2 inhibitor at this time.  Hemoglobin A1c today 9.8% worsening.  She is mostly having blood sugar in the range of 200-300.  I would like to start mealtime insulin.  - Hemoglobin A1c goal : <7%  - Medications: See below  I) increase Tresiba from 32 units 2 times a day. II) continue glimepiride 4 mg daily. III) start NovoLog 8 units with meals 3 times a day.  - Home glucose testing: 3-4 times a day before meals and at bedtime.  Discussed about continuous glucose monitoring, will consider in the future.  - Discussed/ Gave Hypoglycemia treatment plan.  # Consult : not required at this time.  # Annual urine for microalbuminuria/ creatinine ratio, no microalbuminuria currently. Last  Lab Results  Component Value Date   MICRALBCREAT 7.0 04/07/2023    # Foot check nightly.  # Annual dilated diabetic eye exams.   - Diet: Make healthy diabetic food choices - Life style / activity / exercise: Discussed.  2. Blood pressure  -  BP Readings from Last 1 Encounters:  07/09/23 126/80    - Control is in target.  - No change in current plans.  3. Lipid status / Hyperlipidemia - Last  Lab Results  Component Value Date   LDLCALC 178 (H) 03/04/2023   -Patient reports had side effect mainly muscle ache with atorvastatin, simvastatin and ? rosuvastatin.  Repatha was  prescribed in the last visit however not covered by insurance.  She does not want to take Repatha anyway.  She want to try statin therapy. -Start pravastatin 10 mg daily.  Prescription resent.   -  Check fasting lipid panel  # Primary hypothyroidism : -Currently taking levothyroxine 112 mcg 2 tablets daily.  Dose was increased when TSH was 30 in June.  Recent TSH improved however is still elevated to 10 as of August 1.  She reports compliance.  She still has complaints of fatigue.  She reports that when she was on Synthroid used to have regulated and normal thyroid function test and thyroid hormone levels.  With the generic levothyroxine she has not been feeling good. -She is currently taking Synthroid 112 mcg 2 tablets daily. --Check TSH, free T4   # Obesity, BMI 36 -Will refer to weight management clinic.  Diagnoses and all orders for this visit:  Uncontrolled type 2 diabetes mellitus with hyperglycemia, with long-term current use of insulin (HCC) -     POCT glycosylated hemoglobin (Hb A1C) -     Lipid panel; Future -     Basic metabolic panel; Future  Acquired autoimmune hypothyroidism -     T4, free; Future -     TSH; Future  Obesity (BMI 30-39.9) -     Amb Ref to Medical Weight Management  Other orders -     insulin aspart (NOVOLOG FLEXPEN) 100 UNIT/ML FlexPen; Inject 8 Units into the skin 3 (three) times daily with meals. -     Insulin Pen Needle 32G X 4 MM MISC; Use 5x a day   DISPOSITION Follow up in clinic in 2 months..   All questions answered and patient verbalized understanding of the plan.  [place] Shatoria Stooksbury, MD [facility] [address] [place], [address] [place], [place] [address] Phone # [phone]  At least part of this note was generated using voice recognition software. Inadvertent word errors may have occurred, which were not recognized during the proofreading process.

## 2023-07-09 NOTE — Patient Instructions (Signed)
Increase tresiba 32 units two times a day. Glimepiride 4 mg daily.  Novolog 8 units with meals three times a day.

## 2023-07-13 ENCOUNTER — Other Ambulatory Visit (HOSPITAL_COMMUNITY)
Admission: RE | Admit: 2023-07-13 | Discharge: 2023-07-13 | Disposition: A | Discharge status: Home / Self Care | Payer: Medicaid Other | Source: Ambulatory Visit | Attending: Endocrinology | Admitting: Endocrinology

## 2023-07-13 DIAGNOSIS — E1165 Type 2 diabetes mellitus with hyperglycemia: Secondary | ICD-10-CM

## 2023-07-13 DIAGNOSIS — Z794 Long term (current) use of insulin: Secondary | ICD-10-CM | POA: Diagnosis present

## 2023-07-13 LAB — BASIC METABOLIC PANEL
Anion gap: 10 (ref 5–15)
BUN: 19 mg/dL (ref 6–20)
CO2: 21 mmol/L — ABNORMAL LOW (ref 22–32)
Calcium: 9.1 mg/dL (ref 8.9–10.3)
Chloride: 105 mmol/L (ref 98–111)
Creatinine, Ser: 0.75 mg/dL (ref 0.44–1.00)
GFR, Estimated: >60 mL/min (ref >60)
Glucose, Bld: 161 mg/dL — ABNORMAL HIGH (ref 70–99)
Potassium: 3.8 mmol/L (ref 3.5–5.1)
Sodium: 136 mmol/L (ref 135–145)

## 2023-07-13 LAB — LIPID PANEL
Cholesterol: 191 mg/dL (ref 0–200)
HDL: 48 mg/dL (ref >40)
LDL Cholesterol: 131 mg/dL — ABNORMAL HIGH (ref 0–99)
Total CHOL/HDL Ratio: 4 {ratio}
Triglycerides: 59 mg/dL (ref <150)
VLDL: 12 mg/dL (ref 0–40)

## 2023-07-14 ENCOUNTER — Telehealth: Payer: Self-pay

## 2023-07-14 NOTE — Telephone Encounter (Signed)
Lab results given to patient as directed by MD

## 2023-07-14 NOTE — Telephone Encounter (Signed)
-----   Message from Amy Lester sent at 07/14/2023  5:24 AM EST ----- Please notify patient of cholesterol levels improving, LDL improved to 131 from 178, continue on current dose of pravastatin for now, will discuss to consider increase in follow up visit. Electrolytes and renal function normal.

## 2023-08-20 ENCOUNTER — Ambulatory Visit: Payer: Medicaid Other | Admitting: Dietician

## 2023-09-15 ENCOUNTER — Encounter: Payer: Self-pay | Admitting: Endocrinology

## 2023-09-15 ENCOUNTER — Ambulatory Visit: Payer: Medicaid Other | Admitting: Endocrinology

## 2023-09-15 VITALS — BP 116/60 | HR 84 | Ht 64.0 in | Wt 212.8 lb

## 2023-09-15 DIAGNOSIS — E1165 Type 2 diabetes mellitus with hyperglycemia: Secondary | ICD-10-CM | POA: Diagnosis not present

## 2023-09-15 DIAGNOSIS — E063 Autoimmune thyroiditis: Secondary | ICD-10-CM | POA: Diagnosis not present

## 2023-09-15 DIAGNOSIS — E782 Mixed hyperlipidemia: Secondary | ICD-10-CM

## 2023-09-15 DIAGNOSIS — Z794 Long term (current) use of insulin: Secondary | ICD-10-CM | POA: Diagnosis not present

## 2023-09-15 DIAGNOSIS — E669 Obesity, unspecified: Secondary | ICD-10-CM

## 2023-09-15 MED ORDER — NOVOLOG FLEXPEN 100 UNIT/ML ~~LOC~~ SOPN: 10.0000 [IU] | PEN_INJECTOR | Freq: Three times a day (TID) | SUBCUTANEOUS | 11 refills | Status: DC

## 2023-09-15 NOTE — Patient Instructions (Signed)
 Please complete labs few days prior to follow up visit.

## 2023-09-15 NOTE — Progress Notes (Signed)
 Outpatient Endocrinology Note Amy Foor, MD  09/15/23  Patient's Name: Amy Lester    DOB: [birth year]    MRN: [id number]                                                    REASON OF VISIT: Follow up for type 2 diabetes mellitus /hypothyroidism  PCP: Mercer Clotilda SAUNDERS, MD  HISTORY OF PRESENT ILLNESS:   Amy Lester is a 53 y.o. old female with past medical history listed below, is here for follow up of type 2 diabetes mellitus / hypothyroidism.    Pertinent Diabetes History: Patient was diagnosed with type 2 diabetes mellitus in 2018.  She had prediabetes prior to diagnosis of diabetes mellitus.  Patient has uncontrolled type 2 diabetes mellitus.    Chronic Diabetes Complications : Retinopathy: no. Last ophthalmology exam was done on annually, reportedly. Nephropathy: no, on lisinopril . Peripheral neuropathy: no Coronary artery disease: no Stroke: no  Relevant comorbidities and cardiovascular risk factors: Obesity: yes Body mass index is 36.53 kg/m.  Hypertension: yes Hyperlipidemia. Yes.  Side effects from different statin, Lipitor caused upper body muscle ache, does not want to take it.  Currently on Zetia  /fish oil.  Per chart review she had tried simvastatin in the past had muscle ache and is stopped.  Patient reports she also had side effect from rosuvastatin/Crestor in the past.  Repatha  denied by medical insurance and she does not like to take anyway.  Current / Home Diabetic regimen includes: Tresiba  32 units in the morning and 32 units at bedtime. NovoLog  10 units with meals 3 times a day. Glimepiride  4 mg daily.  Prior diabetic medications: Metformin stopped due to GI intolerance/abdominal discomfort. Ozempic  stopped by patient due to concern with side effects and lawsuits. Glipizide  in the past.  Glycemic data:  No glucometer data to review.  She forgot to bring the glucometer in the clinic today.  She reports blood sugar has been  high.  Hypoglycemia: Patient has no hypoglycemic episodes. Patient has hypoglycemia awareness.  Factors modifying glucose control: 1.  Diabetic diet assessment: 3 meals a day.  2.  Staying active or exercising: No formal exercise  3.  Medication compliance: compliant most of the time.  # Hypothyroidism -Strong family history of hypothyroidism.  She was diagnosed with hypothyroidism 25+ years ago.  She has mostly been on levothyroxine /Synthroid .  She had tried levothyroxine /Cytomel  in the past.  Levothyroxine  dose was adjusted periodically in the past.  Interval history  Patient reports she is having a lot of the stress, death in the family and eating has not been right in last 1 and half months.  She thinks like diabetes probably remained uncontrolled, and expecting to improve in coming days.  No glucose data to review.  She reports compliance with insulin  and diabetes regimen as noted above.  She has been taking levothyroxine /Synthroid  224 mcg daily.  Reports compliance.  Denies palpitation.  She has not completed thyroid  labs as planned in the last visit.  She was referred to weight management clinic last time she reports she has not heard anything and appointment has not been set up.  REVIEW OF SYSTEMS As per history of present illness.   PAST MEDICAL HISTORY: Past Medical History:  Diagnosis Date   Asthma    Blood transfusion without reported diagnosis  Chronic migraine    Common migraine with intractable migraine 02/16/2019   Diabetes mellitus without complication (HCC)    Endometriosis    Fatty liver    GERD (gastroesophageal reflux disease)    Hyperlipidemia    Hypertension    Hypothyroidism    Incontinence    Irregular heartbeat    Multiple sclerosis (HCC)    Palpitations    Postconcussive syndrome 05/09/2020   Pre-diabetes    Sleep apnea     PAST SURGICAL HISTORY: Past Surgical History:  Procedure Laterality Date   ABDOMINAL HYSTERECTOMY     CESAREAN  SECTION     x1   CHOLECYSTECTOMY     COLONOSCOPY WITH PROPOFOL  N/A 11/04/2020   Surgeon: Shaaron Lamar HERO, MD; normal exam.  Repeat in 10 years.   ENDOMETRIAL ABLATION     ESOPHAGOGASTRODUODENOSCOPY (EGD) WITH PROPOFOL  N/A 11/04/2020   Surgeon: Shaaron Lamar HERO, MD; normal exam.   FOOT SURGERY Left 06/2018   foot reconstruction   REPLACEMENT TOTAL KNEE Left     ALLERGIES: Allergies  Allergen Reactions   Crestor [Rosuvastatin]     myalgia   Lipitor [Atorvastatin ]     myalgia   Percocet [Oxycodone-Acetaminophen ] Nausea And Vomiting   Zocor [Simvastatin]     myalgia    FAMILY HISTORY:  Family History  Problem Relation Age of Onset   Heart attack Mother    Stroke Mother    Cancer Mother        breast cancer   Hyperlipidemia Mother    Hypothyroidism Mother    Cancer Father        lymph nodes, liver cancer   Colon cancer Father        early 3s   Other Daughter        Fine Gold Type II Syndrome and cognetive issues   Memory loss Daughter    Other Son        Fine Gold Syndrome and BPES   Diabetes Son 22       IDDM   Hypothyroidism Sister    Cancer Sister    Hyperthyroidism Neg Hx     SOCIAL HISTORY: Social History   Socioeconomic History   Marital status: Married    Spouse name: Not on file   Number of children: Not on file   Years of education: 14   Highest education level: Not on file  Occupational History   Not on file  Tobacco Use   Smoking status: Never    Passive exposure: Past   Smokeless tobacco: Never  Vaping Use   Vaping status: Never Used  Substance and Sexual Activity   Alcohol use: Yes    Comment: once a year   Drug use: Never   Sexual activity: Not Currently  Other Topics Concern   Not on file  Social History Narrative   Left handed    Caffeine 1 daily    Lives at home with husband and adult daughter   One story home   Social Drivers of Health   Financial Resource Strain: Not on file  Food Insecurity: Not on file  Transportation  Needs: Not on file  Physical Activity: Not on file  Stress: Not on file  Social Connections: Not on file    MEDICATIONS:  Current Outpatient Medications  Medication Sig Dispense Refill   acetaminophen  (TYLENOL ) 500 MG tablet Take 500 mg by mouth as needed for moderate pain.     albuterol  (PROVENTIL ) (2.5 MG/3ML) 0.083% nebulizer solution Take 3 mLs (  2.5 mg total) by nebulization every 6 (six) hours as needed for wheezing or shortness of breath. 75 mL 3   albuterol  (VENTOLIN  HFA) 108 (90 Base) MCG/ACT inhaler Inhale 1-2 puffs into the lungs every 6 (six) hours as needed for wheezing or shortness of breath. 18 g 0   aspirin  EC 81 MG tablet Take 1 tablet (81 mg total) by mouth daily. Swallow whole.     busPIRone (BUSPAR) 7.5 MG tablet Take 7.5 mg by mouth 2 (two) times daily as needed.     calcium  carbonate (TUMS - DOSED IN MG ELEMENTAL CALCIUM ) 500 MG chewable tablet Chew 2-3 tablets by mouth daily as needed for indigestion or heartburn.     cetirizine  (ZYRTEC ) 10 MG tablet Take 1 tablet (10 mg total) by mouth daily. 90 tablet 0   ciprofloxacin (CILOXAN) 0.3 % ophthalmic solution Place into both eyes daily.     clotrimazole -betamethasone  (LOTRISONE ) cream Apply 1 application topically as needed. (Patient taking differently: Apply 1 application  topically as needed (Outbreak).) 45 g 2   Continuous Glucose Receiver (FREESTYLE LIBRE 3 READER) DEVI Use to monitor blood sugar 1 each 0   Continuous Glucose Sensor (FREESTYLE LIBRE 3 SENSOR) MISC 1 Device by Does not apply route every 14 (fourteen) days. Apply 1 sensor on upper arm every 14 days for continuous glucose monitoring 2 each 2   Cyanocobalamin  (B-12 COMPLIANCE INJECTION IJ) Inject 1 Dose as directed every 30 (thirty) days.     escitalopram  (LEXAPRO ) 20 MG tablet Take 1 tablet by mouth daily.     Evolocumab  (REPATHA ) 140 MG/ML SOSY Inject contents of pen twice a month in abdominal skin 2 mL 5   ezetimibe  (ZETIA ) 10 MG tablet Take 1 tablet (10  mg total) by mouth daily. 90 tablet 3   fluticasone  (FLONASE ) 50 MCG/ACT nasal spray Place 1 spray into both nostrils 2 (two) times daily. 16 g 2   Fremanezumab -vfrm (AJOVY ) 225 MG/1.5ML SOAJ Inject 225 mg into the skin every 28 (twenty-eight) days. 1.68 mL 11   glimepiride  (AMARYL ) 4 MG tablet Take 1 tablet (4 mg total) by mouth daily before breakfast. 90 tablet 3   ibuprofen  (ADVIL ) 200 MG tablet Take 600-800 mg by mouth as needed for mild pain or moderate pain.     insulin  degludec (TRESIBA  FLEXTOUCH) 200 UNIT/ML FlexTouch Pen Inject 32 Units into the skin in the morning and at bedtime. 30 mL 3   Insulin  Pen Needle 32G X 4 MM MISC Use 5x a day 300 each 3   levothyroxine  (SYNTHROID ) 112 MCG tablet Take 2 tablets (224 mcg total) by mouth daily. 180 tablet 3   MAGNESIUM OXIDE PO Take 1 tablet by mouth daily.     MELATONIN PO Take 1 capsule by mouth at bedtime as needed (sleep).     methylPREDNISolone  (MEDROL  DOSEPAK) 4 MG TBPK tablet Take as directed for 6 days 1 each 0   metoprolol  tartrate (LOPRESSOR ) 25 MG tablet Take 1 tablet (25 mg total) by mouth in the morning. 90 tablet 3   metoprolol  tartrate (LOPRESSOR ) 50 MG tablet Take 1 tablet (50 mg total) by mouth at bedtime. 90 tablet 3   metroNIDAZOLE  (FLAGYL ) 500 MG tablet Take 1 tablet (500 mg total) by mouth 2 (two) times daily. 14 tablet 0   metroNIDAZOLE  (METROGEL ) 1 % gel Apply 1 application topically at bedtime.      montelukast  (SINGULAIR ) 10 MG tablet Take 10 mg by mouth every morning.     Multiple Vitamins-Minerals (  MULTIVITAMIN WITH MINERALS) tablet Take 1 tablet by mouth daily.     nitroGLYCERIN  (NITROSTAT ) 0.4 MG SL tablet Place 1 tablet (0.4 mg total) under the tongue every 5 (five) minutes as needed for chest pain. 25 tablet 3   nystatin  cream (MYCOSTATIN ) Apply 1 application topically daily as needed for dry skin.     Olopatadine HCl (PAZEO OP) Place 1 drop into both eyes daily.     omega-3 acid ethyl esters (LOVAZA ) 1 g capsule  Take 1 g by mouth 2 (two) times daily.     omeprazole  (PRILOSEC) 40 MG capsule Take 1 capsule (40 mg total) by mouth 2 (two) times daily before a meal. 60 capsule 3   ondansetron  (ZOFRAN ) 4 MG tablet Take 1 tablet (4 mg total) by mouth every 8 (eight) hours as needed for nausea or vomiting. (Patient taking differently: Take 4 mg by mouth as needed for nausea or vomiting.) 20 tablet 1   ondansetron  (ZOFRAN ) 8 MG tablet Take 1 tablet (8 mg total) by mouth every 8 (eight) hours as needed for nausea or vomiting. 20 tablet 1   pravastatin  (PRAVACHOL ) 10 MG tablet Take 1 tablet (10 mg total) by mouth daily. 90 tablet 3   Red Yeast Rice Extract (RED YEAST RICE PO) Take 1 tablet by mouth daily.      Respiratory Therapy Supplies (CARETOUCH CPAP & BIPAP HOSE) MISC by Does not apply route. CPAP Titration @ 16 for pressure.     Riboflavin (VITAMIN B-2 PO) Take 1 tablet by mouth daily.      rizatriptan  (MAXALT ) 10 MG tablet Take 1 tablet (10 mg total) by mouth 3 (three) times daily as needed for migraine. (Patient taking differently: Take 10 mg by mouth as needed for migraine.) 10 tablet 5   Tretinoin, Facial Wrinkles, (RENOVA) 0.02 % CREA Apply 1 application topically at bedtime.     triamcinolone  cream (KENALOG ) 0.1 % Apply 1 application topically 2 (two) times daily as needed. (Patient taking differently: Apply 1 application  topically 2 (two) times daily as needed (rash).) 45 g 0   triamterene -hydrochlorothiazide  (MAXZIDE-25) 37.5-25 MG tablet Take 1 tablet by mouth daily. 90 tablet 3   venlafaxine  XR (EFFEXOR  XR) 75 MG 24 hr capsule Take 1 capsule (75 mg total) by mouth daily with breakfast. 30 capsule 5   Vitamin D , Ergocalciferol , (DRISDOL ) 1.25 MG (50000 UNIT) CAPS capsule Take 1 capsule (50,000 Units total) by mouth every 7 (seven) days. 12 capsule 0   zonisamide  (ZONEGRAN ) 100 MG capsule Take 2 capsules (200 mg total) by mouth daily. 60 capsule 5   insulin  aspart (NOVOLOG  FLEXPEN) 100 UNIT/ML FlexPen  Inject 10 Units into the skin 3 (three) times daily with meals. 15 mL 11   lisinopril  (ZESTRIL ) 5 MG tablet Take 1 tablet (5 mg total) by mouth daily. (Patient not taking: Reported on 05/03/2023) 90 tablet 1   predniSONE  (DELTASONE ) 20 MG tablet Take 2 tablets (40 mg total) by mouth daily with breakfast. (Patient not taking: Reported on 09/15/2023) 10 tablet 0   promethazine -dextromethorphan  (PROMETHAZINE -DM) 6.25-15 MG/5ML syrup Take 5 mLs by mouth 4 (four) times daily as needed. (Patient not taking: Reported on 09/15/2023) 100 mL 0   No current facility-administered medications for this visit.    PHYSICAL EXAM: Vitals:   09/15/23 1534  BP: 116/60  Pulse: 84  SpO2: 97%  Weight: 212 lb 12.8 oz (96.5 kg)  Height: 5' 4 (1.626 m)     Body mass index is 36.53 kg/m.  Wt Readings from Last 3 Encounters:  09/15/23 212 lb 12.8 oz (96.5 kg)  07/09/23 213 lb (96.6 kg)  06/10/23 219 lb 9.6 oz (99.6 kg)    General: Well developed, well nourished female in no apparent distress.  HEENT: AT/[place], no external lesions.  Eyes: Conjunctiva clear and no icterus. Neck: Neck supple  Lungs: Respirations not labored Neurologic: Alert, oriented, normal speech Extremities / Skin: Dry.  No edema. Psychiatric: Does not appear depressed or anxious  Diabetic Foot Exam - Simple   No data filed    LABS Reviewed Lab Results  Component Value Date   HGBA1C 9.8 (A) 07/09/2023   HGBA1C 8.3 (H) 04/08/2023   HGBA1C 8.0 (A) 01/19/2023   No results found for: FRUCTOSAMINE Lab Results  Component Value Date   CHOL 191 07/13/2023   HDL 48 07/13/2023   LDLCALC 131 (H) 07/13/2023   LDLDIRECT 172.0 04/08/2023   TRIG 59 07/13/2023   CHOLHDL 4.0 07/13/2023   Lab Results  Component Value Date   MICRALBCREAT 7.0 04/07/2023   MICRALBCREAT 0.5 03/04/2023   Lab Results  Component Value Date   CREATININE 0.75 07/13/2023   Lab Results  Component Value Date   GFR 80.34 03/04/2023    ASSESSMENT / PLAN  1.  Uncontrolled type 2 diabetes mellitus with hyperglycemia, with long-term current use of insulin  (HCC)   2. Acquired autoimmune hypothyroidism   3. Obesity (BMI 30-39.9)   4. Mixed hyperlipidemia     Diabetes Mellitus type 2, complicated by no known complications. - Diabetic status / severity: Uncontrolled  Lab Results  Component Value Date   HGBA1C 9.8 (A) 07/09/2023   Discussed about type 2 diabetes mellitus and importance of better control to avoid chronic diabetic complications.  Patient does not want to go back on Ozempic  or GLP-1 receptor agonist due to potential side effects.  She has complaints of occasional vaginal candidiasis, I would like to avoid SGLT2 inhibitor at this time.  Unable to adjust diabetes regimen, no glucose data to review.  Patient expect to improve on stress level and diet in coming days.  - Medications: See below  I) continue Tresiba  from 32 units 2 times a day. II) continue glimepiride  4 mg daily. III) continue NovoLog  10 units with meals 3 times a day.  - Home glucose testing: 3-4 times a day before meals and at bedtime.  Discussed about continuous glucose monitoring, will consider in the future.  Asked to bring glucometer in the follow-up visit.  - Discussed/ Gave Hypoglycemia treatment plan.  # Consult : not required at this time.  # Annual urine for microalbuminuria/ creatinine ratio, no microalbuminuria currently. Last  Lab Results  Component Value Date   MICRALBCREAT 7.0 04/07/2023    # Foot check nightly.  # Annual dilated diabetic eye exams.   - Diet: Make healthy diabetic food choices - Life style / activity / exercise: Discussed.  2. Blood pressure  -  BP Readings from Last 1 Encounters:  09/15/23 116/60    - Control is in target.  - No change in current plans.  3. Lipid status / Hyperlipidemia - Last  Lab Results  Component Value Date   LDLCALC 131 (H) 07/13/2023   -Patient reports had side effect mainly muscle ache with  atorvastatin , simvastatin and ? rosuvastatin.  Repatha  was prescribed in the last visit however not covered by insurance.  She does not want to take Repatha  anyway.  She want to try statin therapy. -Continue pravastatin  10 mg  daily.  Prescription resent.   -Check lipid panel prior to follow-up visit.  # Primary hypothyroidism : -Currently taking levothyroxine  112 mcg 2 tablets daily.  Dose was increased when TSH was 30 in June.  Recent TSH improved however is still elevated to 10 as of August 1.  She reports compliance.  She still has complaints of fatigue.  She reports that when she was on Synthroid  used to have regulated and normal thyroid  function test and thyroid  hormone levels.  With the generic levothyroxine  she has not been feeling good. -She is currently taking Synthroid  112 mcg 2 tablets daily. -Check TSH, free T4 prior to follow-up visit.  # Obesity, BMI 36 -Will rerefer to weight management clinic, per patient request.  Diagnoses and all orders for this visit:  Uncontrolled type 2 diabetes mellitus with hyperglycemia, with long-term current use of insulin  (HCC) -     Hemoglobin A1c; Future  Acquired autoimmune hypothyroidism -     T4, free; Future -     TSH; Future  Obesity (BMI 30-39.9) -     Amb Ref to Medical Weight Management  Mixed hyperlipidemia -     Lipid panel; Future  Other orders -     insulin  aspart (NOVOLOG  FLEXPEN) 100 UNIT/ML FlexPen; Inject 10 Units into the skin 3 (three) times daily with meals.   Patient will complete lab at [facility] locally prior to follow-up visit.  DISPOSITION Follow up in clinic in 2 months suggested.   All questions answered and patient verbalized understanding of the plan.  Monica Zahler, MD [facility] [address] [place], [address] [place], [place] [address] Phone # [phone]  At least part of this note was generated using voice recognition software. Inadvertent word errors may  have occurred, which were not recognized during the proofreading process.

## 2023-09-17 ENCOUNTER — Encounter: Payer: Self-pay | Admitting: Family Medicine

## 2023-09-17 ENCOUNTER — Ambulatory Visit (INDEPENDENT_AMBULATORY_CARE_PROVIDER_SITE_OTHER): Payer: Medicaid Other | Admitting: Family Medicine

## 2023-09-17 VITALS — BP 122/74 | HR 87 | Temp 98.6°F | Ht 64.0 in | Wt 209.2 lb

## 2023-09-17 DIAGNOSIS — R253 Fasciculation: Secondary | ICD-10-CM | POA: Diagnosis not present

## 2023-09-17 DIAGNOSIS — E785 Hyperlipidemia, unspecified: Secondary | ICD-10-CM | POA: Diagnosis not present

## 2023-09-17 DIAGNOSIS — H6502 Acute serous otitis media, left ear: Secondary | ICD-10-CM

## 2023-09-17 DIAGNOSIS — Z0000 Encounter for general adult medical examination without abnormal findings: Secondary | ICD-10-CM

## 2023-09-17 DIAGNOSIS — E1169 Type 2 diabetes mellitus with other specified complication: Secondary | ICD-10-CM | POA: Diagnosis not present

## 2023-09-17 DIAGNOSIS — T3695XA Adverse effect of unspecified systemic antibiotic, initial encounter: Secondary | ICD-10-CM

## 2023-09-17 DIAGNOSIS — E559 Vitamin D deficiency, unspecified: Secondary | ICD-10-CM | POA: Diagnosis not present

## 2023-09-17 DIAGNOSIS — B379 Candidiasis, unspecified: Secondary | ICD-10-CM

## 2023-09-17 DIAGNOSIS — Z7984 Long term (current) use of oral hypoglycemic drugs: Secondary | ICD-10-CM

## 2023-09-17 LAB — HEMOGLOBIN A1C: Hgb A1c MFr Bld: 8.1 % — ABNORMAL HIGH (ref 4.6–6.5)

## 2023-09-17 LAB — CBC WITH DIFFERENTIAL/PLATELET
Basophils Absolute: 0 10*3/uL (ref 0.0–0.1)
Basophils Relative: 0.6 % (ref 0.0–3.0)
Eosinophils Absolute: 0.2 10*3/uL (ref 0.0–0.7)
Eosinophils Relative: 2.8 % (ref 0.0–5.0)
HCT: 44.6 % (ref 36.0–46.0)
Hemoglobin: 14.7 g/dL (ref 12.0–15.0)
Lymphocytes Relative: 20.1 % (ref 12.0–46.0)
Lymphs Abs: 1.4 10*3/uL (ref 0.7–4.0)
MCHC: 32.9 g/dL (ref 30.0–36.0)
MCV: 88.9 fL (ref 78.0–100.0)
Monocytes Absolute: 0.4 10*3/uL (ref 0.1–1.0)
Monocytes Relative: 5 % (ref 3.0–12.0)
Neutro Abs: 5.1 10*3/uL (ref 1.4–7.7)
Neutrophils Relative %: 71.5 % (ref 43.0–77.0)
Platelets: 316 10*3/uL (ref 150.0–400.0)
RBC: 5.02 Mil/uL (ref 3.87–5.11)
RDW: 13.1 % (ref 11.5–15.5)
WBC: 7.1 10*3/uL (ref 4.0–10.5)

## 2023-09-17 LAB — LIPID PANEL
Cholesterol: 217 mg/dL — ABNORMAL HIGH (ref 0–200)
HDL: 48.3 mg/dL (ref >39.00)
LDL Cholesterol: 139 mg/dL — ABNORMAL HIGH (ref 0–99)
NonHDL: 168.24
Total CHOL/HDL Ratio: 4
Triglycerides: 146 mg/dL (ref 0.0–149.0)
VLDL: 29.2 mg/dL (ref 0.0–40.0)

## 2023-09-17 LAB — COMPREHENSIVE METABOLIC PANEL
ALT: 34 U/L (ref 0–35)
AST: 23 U/L (ref 0–37)
Albumin: 4.3 g/dL (ref 3.5–5.2)
Alkaline Phosphatase: 130 U/L — ABNORMAL HIGH (ref 39–117)
BUN: 23 mg/dL (ref 6–23)
CO2: 24 meq/L (ref 19–32)
Calcium: 9.5 mg/dL (ref 8.4–10.5)
Chloride: 105 meq/L (ref 96–112)
Creatinine, Ser: 0.76 mg/dL (ref 0.40–1.20)
GFR: 88.96 mL/min (ref >60.00)
Glucose, Bld: 245 mg/dL — ABNORMAL HIGH (ref 70–99)
Potassium: 4.3 meq/L (ref 3.5–5.1)
Sodium: 139 meq/L (ref 135–145)
Total Bilirubin: 0.4 mg/dL (ref 0.2–1.2)
Total Protein: 7.6 g/dL (ref 6.0–8.3)

## 2023-09-17 LAB — TSH: TSH: 0.01 u[IU]/mL — ABNORMAL LOW (ref 0.35–5.50)

## 2023-09-17 LAB — MAGNESIUM: Magnesium: 2.1 mg/dL (ref 1.5–2.5)

## 2023-09-17 LAB — T4, FREE: Free T4: 1.61 ng/dL — ABNORMAL HIGH (ref 0.60–1.60)

## 2023-09-17 LAB — VITAMIN D 25 HYDROXY (VIT D DEFICIENCY, FRACTURES): VITD: 37.96 ng/mL (ref 30.00–100.00)

## 2023-09-17 MED ORDER — AMOXICILLIN 500 MG PO TABS: 500.0000 mg | ORAL_TABLET | Freq: Two times a day (BID) | ORAL | 0 refills | Status: AC

## 2023-09-17 MED ORDER — FLUCONAZOLE 150 MG PO TABS: ORAL_TABLET | ORAL | 0 refills | Status: AC

## 2023-09-17 NOTE — Progress Notes (Signed)
 Established Patient Office Visit   Subjective  Patient ID: Amy Lester, female    DOB: [birth year]  Age: 53 y.o. MRN: [id number]  Chief Complaint  Patient presents with   Annual Exam   Ear Pain    Bilateral ear pain, left is worse. Started 1 1/2 weeks ago     Pt is a 53 yo female seen for CPE and who has various concerns.  Pt endorses increased stress d/t several deaths in the family.   Seeing Endo for DM.  Now taking Jardiance .  Does not want to go back on a GLP-1 inhibitor due to potential s/es.  Pt having difficulty losing wt.  Wants to restart Phentermine .  Not currently exercising.  States L foot pain makes it difficult.    Pt with increased drainage from eyes.  Denies irritation, pain, redness.  Using pataday prn in spring and fall.  Also with Ear pain.  L ear feels itchy and has drainage in it at times.  Pt mentions occasional muscle twitch in R medial calf.  Skin looks like it dents in when occurs.    Patient Active Problem List   Diagnosis Date Noted   Rectal pain 06/11/2022   Thrombosed hemorrhoids 05/20/2022   Hemorrhoids 03/17/2021   Fatigue 03/17/2021   LLQ abdominal pain 03/17/2021   Vitamin D  deficiency 01/15/2021   Non-intractable vomiting 12/13/2020   Flatulence 12/13/2020   White matter abnormality on MRI of brain 09/11/2020   Hyperlipidemia 08/28/2020   Nausea without vomiting 07/25/2020   Postconcussive syndrome 05/09/2020   Elevated LFTs 04/19/2020   Abdominal pain 04/19/2020   Dysuria 04/19/2020   Class 2 severe obesity due to excess calories with serious comorbidity and body mass index (BMI) of 38.0 to 38.9 in adult [facility]) 04/12/2019   Type 2 diabetes mellitus without complication (HCC) 04/12/2019   Palpitations 04/12/2019   Mild intermittent asthma without complication 04/12/2019   Seasonal allergies 04/12/2019   Hypothyroidism 04/12/2019   OSA on CPAP 04/12/2019   Gastroesophageal reflux disease 04/12/2019   Essential hypertension 04/12/2019    Intractable chronic migraine without aura 02/16/2019   MS (multiple sclerosis) (HCC) 02/16/2019   Past Medical History:  Diagnosis Date   Asthma    Blood transfusion without reported diagnosis    Chronic migraine    Common migraine with intractable migraine 02/16/2019   Diabetes mellitus without complication (HCC)    Endometriosis    Fatty liver    GERD (gastroesophageal reflux disease)    Hyperlipidemia    Hypertension    Hypothyroidism    Incontinence    Irregular heartbeat    Multiple sclerosis (HCC)    Palpitations    Postconcussive syndrome 05/09/2020   Pre-diabetes    Sleep apnea    Past Surgical History:  Procedure Laterality Date   ABDOMINAL HYSTERECTOMY     CESAREAN SECTION     x1   CHOLECYSTECTOMY     COLONOSCOPY WITH PROPOFOL  N/A 11/04/2020   Surgeon: Shaaron Lamar HERO, MD; normal exam.  Repeat in 10 years.   ENDOMETRIAL ABLATION     ESOPHAGOGASTRODUODENOSCOPY (EGD) WITH PROPOFOL  N/A 11/04/2020   Surgeon: Shaaron Lamar HERO, MD; normal exam.   FOOT SURGERY Left 06/2018   foot reconstruction   REPLACEMENT TOTAL KNEE Left    Social History   Tobacco Use   Smoking status: Never    Passive exposure: Past   Smokeless tobacco: Never  Vaping Use   Vaping status: Never Used  Substance Use Topics  Alcohol use: Yes    Comment: once a year   Drug use: Never   Family History  Problem Relation Age of Onset   Heart attack Mother    Stroke Mother    Cancer Mother        breast cancer   Hyperlipidemia Mother    Hypothyroidism Mother    Cancer Father        lymph nodes, liver cancer   Colon cancer Father        early 39s   Other Daughter        Fine Gold Type II Syndrome and cognetive issues   Memory loss Daughter    Other Son        Fine Gold Syndrome and BPES   Diabetes Son 22       IDDM   Hypothyroidism Sister    Cancer Sister    Hyperthyroidism Neg Hx    Allergies  Allergen Reactions   Crestor [Rosuvastatin]     myalgia   Lipitor [Atorvastatin ]      myalgia   Percocet [Oxycodone-Acetaminophen ] Nausea And Vomiting   Zocor [Simvastatin]     myalgia      ROS Negative unless stated above    Objective:     BP 122/74 (BP Location: Left Arm, Patient Position: Sitting, Cuff Size: Large)   Pulse 87   Temp 98.6 F (37 C) (Oral)   Ht 5' 4 (1.626 m)   Wt 209 lb 3.2 oz (94.9 kg)   SpO2 98%   BMI 35.91 kg/m    Wt Readings from Last 3 Encounters:  09/17/23 209 lb 3.2 oz (94.9 kg)  09/15/23 212 lb 12.8 oz (96.5 kg)  07/09/23 213 lb (96.6 kg)     Physical Exam Constitutional:      Appearance: Normal appearance.  HENT:     Head: Normocephalic and atraumatic.     Right Ear: Tympanic membrane, ear canal and external ear normal.     Left Ear: Ear canal and external ear normal.     Ears:     Comments: Left TM with mild bulging and slight erythema of edges.    Nose: Nose normal.     Mouth/Throat:     Mouth: Mucous membranes are moist.     Pharynx: No oropharyngeal exudate or posterior oropharyngeal erythema.  Eyes:     General: No scleral icterus.    Extraocular Movements: Extraocular movements intact.     Conjunctiva/sclera: Conjunctivae normal.     Pupils: Pupils are equal, round, and reactive to light.  Neck:     Thyroid : No thyromegaly.  Cardiovascular:     Rate and Rhythm: Normal rate and regular rhythm.     Pulses: Normal pulses.     Heart sounds: Normal heart sounds. No murmur heard.    No friction rub.  Pulmonary:     Effort: Pulmonary effort is normal.     Breath sounds: Normal breath sounds. No wheezing, rhonchi or rales.  Abdominal:     General: Bowel sounds are normal.     Palpations: Abdomen is soft.     Tenderness: There is no abdominal tenderness.  Musculoskeletal:        General: No deformity. Normal range of motion.  Lymphadenopathy:     Cervical: No cervical adenopathy.  Skin:    General: Skin is warm and dry.     Findings: No lesion.  Neurological:     General: No focal deficit present.      Mental  Status: She is alert and oriented to person, place, and time.  Psychiatric:        Mood and Affect: Mood normal.        Thought Content: Thought content normal.      No results found for any visits on 09/17/23.    Assessment & Plan:  Well adult exam -Age-appropriate health screenings discussed -Obtain labs -Mammogram done 03/22/2023 -Colonoscopy done 11/04/2020 -OB/GYN for Pap -Immunizations reviewed.  Patient declines at this time. -Next CPE in 1 year -     CBC with Differential/Platelet; Future -     Comprehensive metabolic panel; Future -     Lipid panel; Future -     TSH; Future -     T4, free  Hyperlipidemia, unspecified hyperlipidemia type -Continue Zetia  10 mg daily, Lovaza  1 g twice a day, and pravastatin  10 mg daily -Continue follow-up with cardiology for cholesterol. -     Lipid panel; Future  Type 2 diabetes mellitus with other specified complication, without long-term current use of insulin  (HCC) -Continue current medications including NovoLog  10 units 3 times daily, Tresiba  200 units/mL 32 units in a.m. and nightly, glimepiride  4 mg, Jardiance  25 mg -Continue lifestyle modifications -Patient endorses eye exam in June or July 2024.  Will obtain records. -Foot exam due. -Continue statin and ACE-I -     Hemoglobin A1c; Future  Morbid obesity (HCC) -Body mass index is 35.91 kg/m. -Patient interested in restarting weight loss medication.  Would strongly recommend weight management referral as patient previously started on phentermine  but did not follow-up. -Lifestyle modifications encouraged.  Consider chair exercises. -     CBC with Differential/Platelet; Future -     Comprehensive metabolic panel; Future -     TSH; Future -     T4, free  Vitamin D  deficiency -     VITAMIN D  25 Hydroxy (Vit-D Deficiency, Fractures)  Muscle twitch -     Comprehensive metabolic panel; Future -     Magnesium  Acute serous otitis media of left ear, recurrence not  specified -     Amoxicillin ; Take 1 tablet (500 mg total) by mouth 2 (two) times daily for 7 days.  Dispense: 14 tablet; Refill: 0  Antibiotic-induced yeast infection -     Fluconazole ; Take one tab now.  Repeat dose in 3 days if needed.  Dispense: 2 tablet; Refill: 0   Return if symptoms worsen or fail to improve.   Clotilda JONELLE Single, MD

## 2023-09-20 ENCOUNTER — Other Ambulatory Visit: Payer: Self-pay | Admitting: Family Medicine

## 2023-09-20 DIAGNOSIS — R748 Abnormal levels of other serum enzymes: Secondary | ICD-10-CM

## 2023-10-04 ENCOUNTER — Ambulatory Visit: Payer: Medicaid Other | Admitting: Nurse Practitioner

## 2023-10-07 ENCOUNTER — Encounter (INDEPENDENT_AMBULATORY_CARE_PROVIDER_SITE_OTHER): Payer: Self-pay

## 2023-10-29 ENCOUNTER — Ambulatory Visit: Payer: Medicaid Other | Admitting: Family Medicine

## 2023-11-08 ENCOUNTER — Encounter (INDEPENDENT_AMBULATORY_CARE_PROVIDER_SITE_OTHER): Payer: Self-pay

## 2023-11-10 ENCOUNTER — Encounter (INDEPENDENT_AMBULATORY_CARE_PROVIDER_SITE_OTHER): Payer: Self-pay

## 2023-11-12 ENCOUNTER — Ambulatory Visit: Payer: Medicaid Other | Admitting: Family Medicine

## 2023-11-17 ENCOUNTER — Ambulatory Visit: Payer: Medicaid Other | Admitting: Endocrinology

## 2023-11-19 ENCOUNTER — Ambulatory Visit: Admitting: Family Medicine

## 2023-11-19 ENCOUNTER — Ambulatory Visit: Payer: Medicaid Other | Admitting: Primary Care

## 2023-12-09 ENCOUNTER — Encounter: Payer: Self-pay | Admitting: Endocrinology

## 2023-12-09 ENCOUNTER — Encounter (INDEPENDENT_AMBULATORY_CARE_PROVIDER_SITE_OTHER): Admitting: Family Medicine

## 2023-12-09 ENCOUNTER — Encounter: Payer: Self-pay | Admitting: Family Medicine

## 2023-12-09 ENCOUNTER — Ambulatory Visit: Payer: Medicaid Other | Admitting: Endocrinology

## 2023-12-09 ENCOUNTER — Ambulatory Visit: Admitting: Family Medicine

## 2023-12-09 VITALS — BP 130/72 | HR 68 | Resp 20 | Ht 64.0 in | Wt 211.0 lb

## 2023-12-09 VITALS — BP 132/90 | HR 74 | Temp 101.1°F | Ht 64.0 in | Wt 210.4 lb

## 2023-12-09 DIAGNOSIS — E559 Vitamin D deficiency, unspecified: Secondary | ICD-10-CM

## 2023-12-09 DIAGNOSIS — E538 Deficiency of other specified B group vitamins: Secondary | ICD-10-CM

## 2023-12-09 DIAGNOSIS — I10 Essential (primary) hypertension: Secondary | ICD-10-CM | POA: Diagnosis not present

## 2023-12-09 DIAGNOSIS — F4323 Adjustment disorder with mixed anxiety and depressed mood: Secondary | ICD-10-CM | POA: Diagnosis not present

## 2023-12-09 DIAGNOSIS — E782 Mixed hyperlipidemia: Secondary | ICD-10-CM | POA: Diagnosis not present

## 2023-12-09 DIAGNOSIS — Z794 Long term (current) use of insulin: Secondary | ICD-10-CM

## 2023-12-09 DIAGNOSIS — E66812 Obesity, class 2: Secondary | ICD-10-CM | POA: Diagnosis not present

## 2023-12-09 DIAGNOSIS — R748 Abnormal levels of other serum enzymes: Secondary | ICD-10-CM | POA: Diagnosis not present

## 2023-12-09 DIAGNOSIS — E063 Autoimmune thyroiditis: Secondary | ICD-10-CM | POA: Diagnosis not present

## 2023-12-09 DIAGNOSIS — E1165 Type 2 diabetes mellitus with hyperglycemia: Secondary | ICD-10-CM

## 2023-12-09 DIAGNOSIS — E669 Obesity, unspecified: Secondary | ICD-10-CM

## 2023-12-09 DIAGNOSIS — E1169 Type 2 diabetes mellitus with other specified complication: Secondary | ICD-10-CM

## 2023-12-09 DIAGNOSIS — Z6836 Body mass index (BMI) 36.0-36.9, adult: Secondary | ICD-10-CM

## 2023-12-09 DIAGNOSIS — D229 Melanocytic nevi, unspecified: Secondary | ICD-10-CM

## 2023-12-09 LAB — COMPREHENSIVE METABOLIC PANEL WITH GFR
ALT: 24 U/L (ref 0–35)
AST: 20 U/L (ref 0–37)
Albumin: 4.2 g/dL (ref 3.5–5.2)
Alkaline Phosphatase: 145 U/L — ABNORMAL HIGH (ref 39–117)
BUN: 12 mg/dL (ref 6–23)
CO2: 26 meq/L (ref 19–32)
Calcium: 9.3 mg/dL (ref 8.4–10.5)
Chloride: 102 meq/L (ref 96–112)
Creatinine, Ser: 0.69 mg/dL (ref 0.40–1.20)
GFR: 98.37 mL/min (ref >60.00)
Glucose, Bld: 134 mg/dL — ABNORMAL HIGH (ref 70–99)
Potassium: 4.4 meq/L (ref 3.5–5.1)
Sodium: 137 meq/L (ref 135–145)
Total Bilirubin: 0.5 mg/dL (ref 0.2–1.2)
Total Protein: 7.8 g/dL (ref 6.0–8.3)

## 2023-12-09 LAB — VITAMIN B12: Vitamin B-12: 516 pg/mL (ref 211–911)

## 2023-12-09 LAB — POCT GLYCOSYLATED HEMOGLOBIN (HGB A1C): Hemoglobin A1C: 7.8 % — AB (ref 4.0–5.6)

## 2023-12-09 LAB — VITAMIN D 25 HYDROXY (VIT D DEFICIENCY, FRACTURES): VITD: 35.09 ng/mL (ref 30.00–100.00)

## 2023-12-09 LAB — GAMMA GT: GGT: 25 U/L (ref 7–51)

## 2023-12-09 MED ORDER — NOVOLOG FLEXPEN 100 UNIT/ML ~~LOC~~ SOPN: 12.0000 [IU] | PEN_INJECTOR | Freq: Three times a day (TID) | SUBCUTANEOUS | 4 refills | Status: AC

## 2023-12-09 MED ORDER — INSULIN PEN NEEDLE 32G X 4 MM MISC: 3 refills | Status: AC

## 2023-12-09 MED ORDER — CYANOCOBALAMIN 1000 MCG/ML IJ SOLN
1000.0000 ug | Freq: Once | INTRAMUSCULAR | Status: AC
Start: 2023-12-09 — End: 2023-12-09
  Administered 2023-12-09: 1000 ug via INTRAMUSCULAR

## 2023-12-09 MED ORDER — ESCITALOPRAM OXALATE 20 MG PO TABS: 20.0000 mg | ORAL_TABLET | Freq: Every day | ORAL | 3 refills | Status: AC

## 2023-12-09 MED ORDER — PHENTERMINE HCL 15 MG PO CAPS
15.0000 mg | ORAL_CAPSULE | ORAL | 2 refills | Status: AC
  Filled 2024-05-03: qty 30, 30d supply, fill #0

## 2023-12-09 NOTE — Progress Notes (Signed)
 Established Patient Office Visit   Subjective  Patient ID: Amy Lester, female    DOB: [birth year]  Age: 53 y.o. MRN: [id number]  Chief Complaint  Patient presents with   Follow-up    1 MONTH follow-up, talk about weight loss, phentermine     Patient is a 53 year old female seen for follow-up.  Patient states a lot has been going on causing extra stress in her life.  Patient states her husband got into legal trouble which is impacting her and her daughter.  Patient is currently in biweekly counseling.  Pt notes decrease in activity/finds it difficult to get up and do things.  Does because she knows she has to take care of her daughter.  In the past patient had prescriptions from previous provider for BuSpar, Lexapro, and Effexor.  Patient states she would take the medications for a few days as needed.  Inquires about refills.  Patient had appointment with endocrinology today.  States insulin doses were increased.  Taking Synthroid 224 mcg daily.  Given Rx for phentermine.  In the past patient was given phentermine by this provider however was lost to follow-up.  Patient was also sent to weight management however expresses concerns due to cost of visits/not being covered by insurance.  Patient has seen nutritionist in the past.  Has not been exercising consistently.  Also limited in exercise due to history of ankle injury.  Endorses eating more junk food late at night due to current stress.  Patient requesting referral to dermatology.  Has noticed several new bumps on cheek and neck.  Patient requesting vitamin B12 injection while he here.  Has B12 injection scheduled for tomorrow.  Unsure of last vitamin B12 check.    Patient Active Problem List   Diagnosis Date Noted   Rectal pain 06/11/2022   Thrombosed hemorrhoids 05/20/2022   Hemorrhoids 03/17/2021   Fatigue 03/17/2021   LLQ abdominal pain 03/17/2021   Vitamin D deficiency 01/15/2021   Non-intractable vomiting 12/13/2020    Flatulence 12/13/2020   White matter abnormality on MRI of brain 09/11/2020   Hyperlipidemia 08/28/2020   Nausea without vomiting 07/25/2020   Postconcussive syndrome 05/09/2020   Elevated LFTs 04/19/2020   Abdominal pain 04/19/2020   Dysuria 04/19/2020   Class 2 severe obesity due to excess calories with serious comorbidity and body mass index (BMI) of 38.0 to 38.9 in adult (HCC) 04/12/2019   Type 2 diabetes mellitus without complication (HCC) 04/12/2019   Palpitations 04/12/2019   Mild intermittent asthma without complication 04/12/2019   Seasonal allergies 04/12/2019   Hypothyroidism 04/12/2019   OSA on CPAP 04/12/2019   Gastroesophageal reflux disease 04/12/2019   Essential hypertension 04/12/2019   Intractable chronic migraine without aura 02/16/2019   MS (multiple sclerosis) (HCC) 02/16/2019   Past Medical History:  Diagnosis Date   Asthma    Blood transfusion without reported diagnosis    Chronic migraine    Common migraine with intractable migraine 02/16/2019   Diabetes mellitus without complication (HCC)    Endometriosis    Fatty liver    GERD (gastroesophageal reflux disease)    Hyperlipidemia    Hypertension    Hypothyroidism    Incontinence    Irregular heartbeat    Multiple sclerosis (HCC)    Palpitations    Postconcussive syndrome 05/09/2020   Pre-diabetes    Sleep apnea    Past Surgical History:  Procedure Laterality Date   ABDOMINAL HYSTERECTOMY     CESAREAN SECTION     x1  CHOLECYSTECTOMY     COLONOSCOPY WITH PROPOFOL N/A 11/04/2020   Surgeon: Corbin Ade, MD; normal exam.  Repeat in 10 years.   ENDOMETRIAL ABLATION     ESOPHAGOGASTRODUODENOSCOPY (EGD) WITH PROPOFOL N/A 11/04/2020   Surgeon: Corbin Ade, MD; normal exam.   FOOT SURGERY Left 06/2018   foot reconstruction   REPLACEMENT TOTAL KNEE Left    Social History   Tobacco Use   Smoking status: Never    Passive exposure: Past   Smokeless tobacco: Never  Vaping Use   Vaping  status: Never Used  Substance Use Topics   Alcohol use: Yes    Comment: once a year   Drug use: Never   Family History  Problem Relation Age of Onset   Heart attack Mother    Stroke Mother    Cancer Mother        breast cancer   Hyperlipidemia Mother    Hypothyroidism Mother    Cancer Father        lymph nodes, liver cancer   Colon cancer Father        early 29s   Other Daughter        Fine Gold Type II Syndrome and cognetive issues   Memory loss Daughter    Other Son        Fine Gold Syndrome and BPES   Diabetes Son 22       IDDM   Hypothyroidism Sister    Cancer Sister    Hyperthyroidism Neg Hx    Allergies  Allergen Reactions   Crestor [Rosuvastatin]     myalgia   Lipitor [Atorvastatin]     myalgia   Percocet [Oxycodone-Acetaminophen] Nausea And Vomiting   Zocor [Simvastatin]     myalgia      ROS Negative unless stated above    Objective:     BP (!) 132/90 (BP Location: Left Arm, Patient Position: Sitting, Cuff Size: Normal)   Pulse 74   Temp (!) 101.1 F (38.4 C) (Oral)   Ht 5\' 4"  (1.626 m)   Wt 210 lb 6.4 oz (95.4 kg)   SpO2 97%   BMI 36.12 kg/m  BP Readings from Last 3 Encounters:  12/09/23 (!) 132/90  12/09/23 130/72  09/17/23 122/74   Wt Readings from Last 3 Encounters:  12/09/23 210 lb 6.4 oz (95.4 kg)  12/09/23 211 lb (95.7 kg)  09/17/23 209 lb 3.2 oz (94.9 kg)      Physical Exam Constitutional:      General: She is not in acute distress.    Appearance: Normal appearance. She is obese.  HENT:     Head: Normocephalic and atraumatic.     Nose: Nose normal.     Mouth/Throat:     Mouth: Mucous membranes are moist.  Cardiovascular:     Rate and Rhythm: Normal rate and regular rhythm.     Heart sounds: Normal heart sounds. No murmur heard.    No gallop.  Pulmonary:     Effort: Pulmonary effort is normal. No respiratory distress.     Breath sounds: Normal breath sounds. No wheezing, rhonchi or rales.  Skin:    General: Skin is  warm and dry.     Comments: Nevi on bilateral neck and lateral cheek/face.  Neurological:     Mental Status: She is alert and oriented to person, place, and time.       12/09/2023   12:10 PM 09/17/2023    1:22 PM 05/03/2023   10:33 AM  Depression screen PHQ 2/9  Decreased Interest 1 1 0  Down, Depressed, Hopeless 0 0 0  PHQ - 2 Score 1 1 0  Altered sleeping 0 1   Tired, decreased energy 1 1   Change in appetite 0 0   Feeling bad or failure about yourself  0 0   Trouble concentrating 0 0   Moving slowly or fidgety/restless 0 0   Suicidal thoughts 0 0   PHQ-9 Score 2 3   Difficult doing work/chores Somewhat difficult Not difficult at all       12/09/2023   12:11 PM 09/17/2023    1:23 PM 09/28/2022    1:52 PM  GAD 7 : Generalized Anxiety Score  Nervous, Anxious, on Edge 0 0 0  Control/stop worrying 1 0 0  Worry too much - different things 0 0 0  Trouble relaxing 1 0 0  Restless 0 0 0  Easily annoyed or irritable 0 1 0  Afraid - awful might happen 0 0 0  Total GAD 7 Score 2 1 0  Anxiety Difficulty Somewhat difficult Not difficult at all       Results for orders placed or performed in visit on 12/09/23  POCT glycosylated hemoglobin (Hb A1C)  Result Value Ref Range   Hemoglobin A1C 7.8 (A) 4.0 - 5.6 %   HbA1c POC (<> result, manual entry)     HbA1c, POC (prediabetic range)     HbA1c, POC (controlled diabetic range)        Assessment & Plan:  Adjustment reaction with anxiety and depression -     Escitalopram Oxalate; Take 1 tablet (20 mg total) by mouth daily.  Dispense: 30 tablet; Refill: 3  Class 2 severe obesity due to excess calories with serious comorbidity and body mass index (BMI) of 36.0 to 36.9 in adult [facility])  Vitamin B12 deficiency -     Vitamin B12; Future -     Cyanocobalamin  Type 2 diabetes mellitus with other specified complication, without long-term current use of insulin (HCC)  Essential hypertension  Vitamin D deficiency -     VITAMIN D 25  Hydroxy (Vit-D Deficiency, Fractures); Future  Pt with increased anxiety and depression symptoms.  PHQ-9 score 2 this visit and GAD-7 score 2.  Patient previously on several antianxiety/antidepression medications from an outside provider.  Advised to take only escitalopram 20 mg daily, consistently.  Continue counseling.  Discussed the importance of self-care.  Body mass index is 36.12 kg/m.  Discussed the importance of lifestyle modifications and increasing physical activity.  Patient to work on stress eating.  Advised to stop eating after 7 PM.  Continue follow-up with nutritionist.  Start phentermine as prescribed/to be managed by endocrinology.  Will check with referral coordinator regarding weight management clinics that might take patient's insurance.  Advised may be limited.  Recheck vitamin B12 level this visit.  Then patient can receive a B12 injection.  If levels are normal pt can take OTC vitamin B12 supplements.  BP elevated.  Recheck.  Patient previously on blood pressure medications however per chart review does not appear to have had recent refills since 2022.  Previously on Lopressor 25 mg in a.m. and 50 mg nightly, triamterene-hydrochlorothiazide 37.5-25 mg daily, and lisinopril 5 mg daily.  If needed consider restarting lisinopril 5 mg daily for renal protection due to diabetes.   Return in about 6 weeks (around 01/20/2024).   Deeann Saint, MD

## 2023-12-09 NOTE — Patient Instructions (Addendum)
 Latest Reference Range & Units 04/08/23 10:32 07/09/23 10:30 09/17/23 10:25 12/09/23 09:22  Hemoglobin A1C 4.0 - 5.6 % 8.3 (H) 9.8 ! Pend 8.1 (H) 7.8 !  (H): Data is abnormally high !: Data is abnormal  Labs in 4 weeks.

## 2023-12-09 NOTE — Progress Notes (Signed)
 Outpatient Endocrinology Note [place] Josemaria Brining, MD  12/09/23  Patient's Name: Amy Lester    DOB: August 20, 1971    MRN: [id number]                                                    REASON OF VISIT: Follow up for type 2 diabetes mellitus /hypothyroidism  PCP: Deeann Saint, MD  HISTORY OF PRESENT ILLNESS:   Amy Lester is a 53 y.o. old female with past medical history listed below, is here for follow up of type 2 diabetes mellitus / hypothyroidism and obesity.    Pertinent Diabetes History: Patient was diagnosed with type 2 diabetes mellitus in 2018.  She had prediabetes prior to diagnosis of diabetes mellitus.  Patient has uncontrolled type 2 diabetes mellitus.    Chronic Diabetes Complications : Retinopathy: no. Last ophthalmology exam was done on annually, reportedly. Nephropathy: no, on lisinopril. Peripheral neuropathy: no Coronary artery disease: no Stroke: no  Relevant comorbidities and cardiovascular risk factors: Obesity: yes Body mass index is 36.22 kg/m.  Hypertension: yes Hyperlipidemia. Yes.  Side effects from different statin, Lipitor caused upper body muscle ache, does not want to take it.  Currently on Zetia /fish oil.  Per chart review she had tried simvastatin in the past had muscle ache and is stopped.  Patient reports she also had side effect from rosuvastatin/Crestor in the past.  Repatha denied by medical insurance and she does not like to take anyway.  Current / Home Diabetic regimen includes: Tresiba 32 units in the morning and 32 units at bedtime. NovoLog 8-10 units with meals 3 times a day. Glimepiride 4 mg daily. ? 8 mg   Prior diabetic medications: Metformin stopped due to GI intolerance/abdominal discomfort. Ozempic stopped by patient due to concern with side effects and lawsuits. Glipizide in the past.  Glycemic data:  Blood sugar log reviewed as follows mostly acceptable blood sugar in the morning fasting and hyperglycemia at  bedtime..  AM : 145, 132, 122, 134, 103, 118, 118, 130, 109, 122  Evening: 340, 304, 262, 240,292, 272, 302,288, 328  Hypoglycemia: Patient has no hypoglycemic episodes. Patient has hypoglycemia awareness.  Factors modifying glucose control: 1.  Diabetic diet assessment: 3 meals a day.  2.  Staying active or exercising: No formal exercise  3.  Medication compliance: compliant most of the time.  # Hypothyroidism -Strong family history of hypothyroidism.  She was diagnosed with hypothyroidism 25+ years ago.  She has mostly been on levothyroxine/Synthroid.  She had tried levothyroxine/Cytomel in the past.  Levothyroxine dose was adjusted periodically in the past.  She has been lately taking 225 mcg daily.  Interval history  Diabetes regimen as reviewed and noted above.  She has mild improvement on diabetes with hemoglobin A1c of 7.8%.  Patient has been taking levothyroxine 225 Mg daily however she reports she did not take about a week about 2 weeks ago when she was traveling to [place].  She was not able to be seen in weight loss clinic as her insurance did not cover it.  She wants to try short-term phentermine to help with weight loss.  No other complaints today.  REVIEW OF SYSTEMS As per history of present illness.   PAST MEDICAL HISTORY: Past Medical History:  Diagnosis Date   Asthma    Blood transfusion  without reported diagnosis    Chronic migraine    Common migraine with intractable migraine 02/16/2019   Diabetes mellitus without complication (HCC)    Endometriosis    Fatty liver    GERD (gastroesophageal reflux disease)    Hyperlipidemia    Hypertension    Hypothyroidism    Incontinence    Irregular heartbeat    Multiple sclerosis (HCC)    Palpitations    Postconcussive syndrome 05/09/2020   Pre-diabetes    Sleep apnea     PAST SURGICAL HISTORY: Past Surgical History:  Procedure Laterality Date   ABDOMINAL HYSTERECTOMY     CESAREAN SECTION     x1    CHOLECYSTECTOMY     COLONOSCOPY WITH PROPOFOL N/A 11/04/2020   Surgeon: Corbin Ade, MD; normal exam.  Repeat in 10 years.   ENDOMETRIAL ABLATION     ESOPHAGOGASTRODUODENOSCOPY (EGD) WITH PROPOFOL N/A 11/04/2020   Surgeon: Corbin Ade, MD; normal exam.   FOOT SURGERY Left 06/2018   foot reconstruction   REPLACEMENT TOTAL KNEE Left     ALLERGIES: Allergies  Allergen Reactions   Crestor [Rosuvastatin]     myalgia   Lipitor [Atorvastatin]     myalgia   Percocet [Oxycodone-Acetaminophen] Nausea And Vomiting   Zocor [Simvastatin]     myalgia    FAMILY HISTORY:  Family History  Problem Relation Age of Onset   Heart attack Mother    Stroke Mother    Cancer Mother        breast cancer   Hyperlipidemia Mother    Hypothyroidism Mother    Cancer Father        lymph nodes, liver cancer   Colon cancer Father        early 16s   Other Daughter        Fine Gold Type II Syndrome and cognetive issues   Memory loss Daughter    Other Son        Fine Gold Syndrome and BPES   Diabetes Son 22       IDDM   Hypothyroidism Sister    Cancer Sister    Hyperthyroidism Neg Hx     SOCIAL HISTORY: Social History   Socioeconomic History   Marital status: Married    Spouse name: Not on file   Number of children: Not on file   Years of education: 14   Highest education level: Not on file  Occupational History   Not on file  Tobacco Use   Smoking status: Never    Passive exposure: Past   Smokeless tobacco: Never  Vaping Use   Vaping status: Never Used  Substance and Sexual Activity   Alcohol use: Yes    Comment: once a year   Drug use: Never   Sexual activity: Not Currently  Other Topics Concern   Not on file  Social History Narrative   Left handed    Caffeine 1 daily    Lives at home with husband and adult daughter   One story home   Social Drivers of Health   Financial Resource Strain: Not on file  Food Insecurity: Not on file  Transportation Needs: No  Transportation Needs (11/29/2023)   [facility]   PRAPARE - Transportation    Lack of Transportation (Medical): No    Lack of Transportation (Non-Medical): No  Physical Activity: Not on file  Stress: Not on file  Social Connections: Not on file    MEDICATIONS:  Current Outpatient Medications  Medication Sig Dispense Refill   acetaminophen (TYLENOL) 500 MG tablet Take 500 mg by mouth as needed for moderate pain.     albuterol (PROVENTIL) (2.5 MG/3ML) 0.083% nebulizer solution Take 3 mLs (2.5 mg total) by nebulization every 6 (six) hours as needed for wheezing or shortness of breath. 75 mL 3   albuterol (VENTOLIN HFA) 108 (90 Base) MCG/ACT inhaler Inhale 1-2 puffs into the lungs every 6 (six) hours as needed for wheezing or shortness of breath. 18 g 0   aspirin EC 81 MG tablet Take 1 tablet (81 mg total) by mouth daily. Swallow whole.     busPIRone (BUSPAR) 7.5 MG tablet Take 7.5 mg by mouth 2 (two) times daily as needed.     calcium carbonate (TUMS - DOSED IN MG ELEMENTAL CALCIUM) 500 MG chewable tablet Chew 2-3 tablets by mouth daily as needed for indigestion or heartburn.     cetirizine (ZYRTEC) 10 MG tablet Take 1 tablet (10 mg total) by mouth daily. 90 tablet 0   ciprofloxacin (CILOXAN) 0.3 % ophthalmic solution Place into both eyes daily.     clotrimazole-betamethasone (LOTRISONE) cream Apply 1 application topically as needed. (Patient taking differently: Apply 1 application  topically as needed (Outbreak).) 45 g 2   Continuous Glucose Receiver (FREESTYLE LIBRE 3 READER) DEVI Use to monitor blood sugar 1 each 0   Continuous Glucose Sensor (FREESTYLE LIBRE 3 SENSOR) MISC 1 Device by Does not apply route every 14 (fourteen) days. Apply 1 sensor on upper arm every 14 days for continuous glucose monitoring 2 each 2   Cyanocobalamin (B-12 COMPLIANCE INJECTION IJ) Inject 1 Dose as directed every 30 (thirty) days.     escitalopram (LEXAPRO) 20 MG tablet Take 1 tablet by mouth  daily.     Evolocumab (REPATHA) 140 MG/ML SOSY Inject contents of pen twice a month in abdominal skin 2 mL 5   ezetimibe (ZETIA) 10 MG tablet Take 1 tablet (10 mg total) by mouth daily. 90 tablet 3   fluconazole (DIFLUCAN) 150 MG tablet Take one tab now.  Repeat dose in 3 days if needed. 2 tablet 0   fluticasone (FLONASE) 50 MCG/ACT nasal spray Place 1 spray into both nostrils 2 (two) times daily. 16 g 2   Fremanezumab-vfrm (AJOVY) 225 MG/1.5ML SOAJ Inject 225 mg into the skin every 28 (twenty-eight) days. 1.68 mL 11   glimepiride (AMARYL) 4 MG tablet Take 1 tablet (4 mg total) by mouth daily before breakfast. 90 tablet 3   ibuprofen (ADVIL) 200 MG tablet Take 600-800 mg by mouth as needed for mild pain or moderate pain.     insulin degludec (TRESIBA FLEXTOUCH) 200 UNIT/ML FlexTouch Pen Inject 32 Units into the skin in the morning and at bedtime. 30 mL 3   Insulin Pen Needle 32G X 4 MM MISC Use 5x a day 300 each 3   Insulin Pen Needle 32G X 4 MM MISC Use 4x a day 300 each 3   levothyroxine (SYNTHROID) 112 MCG tablet Take 2 tablets (224 mcg total) by mouth daily. 180 tablet 3   MAGNESIUM OXIDE PO Take 1 tablet by mouth daily.     MELATONIN PO Take 1 capsule by mouth at bedtime as needed (sleep).     methylPREDNISolone (MEDROL DOSEPAK) 4 MG TBPK tablet Take as directed for 6 days 1 each 0   metoprolol tartrate (LOPRESSOR) 25 MG tablet Take 1 tablet (25 mg total) by mouth in the morning. 90 tablet 3   metoprolol tartrate (LOPRESSOR)  50 MG tablet Take 1 tablet (50 mg total) by mouth at bedtime. 90 tablet 3   metroNIDAZOLE (FLAGYL) 500 MG tablet Take 1 tablet (500 mg total) by mouth 2 (two) times daily. 14 tablet 0   metroNIDAZOLE (METROGEL) 1 % gel Apply 1 application topically at bedtime.      montelukast (SINGULAIR) 10 MG tablet Take 10 mg by mouth every morning.     Multiple Vitamins-Minerals (MULTIVITAMIN WITH MINERALS) tablet Take 1 tablet by mouth daily.     nitroGLYCERIN (NITROSTAT) 0.4 MG SL  tablet Place 1 tablet (0.4 mg total) under the tongue every 5 (five) minutes as needed for chest pain. 25 tablet 3   nystatin cream (MYCOSTATIN) Apply 1 application topically daily as needed for dry skin.     Olopatadine HCl (PAZEO OP) Place 1 drop into both eyes daily.     omega-3 acid ethyl esters (LOVAZA) 1 g capsule Take 1 g by mouth 2 (two) times daily.     omeprazole (PRILOSEC) 40 MG capsule Take 1 capsule (40 mg total) by mouth 2 (two) times daily before a meal. 60 capsule 3   ondansetron (ZOFRAN) 4 MG tablet Take 1 tablet (4 mg total) by mouth every 8 (eight) hours as needed for nausea or vomiting. (Patient taking differently: Take 4 mg by mouth as needed for nausea or vomiting.) 20 tablet 1   ondansetron (ZOFRAN) 8 MG tablet Take 1 tablet (8 mg total) by mouth every 8 (eight) hours as needed for nausea or vomiting. 20 tablet 1   phentermine 15 MG capsule Take 1 capsule (15 mg total) by mouth every morning. 30 capsule 2   pravastatin (PRAVACHOL) 10 MG tablet Take 1 tablet (10 mg total) by mouth daily. 90 tablet 3   predniSONE (DELTASONE) 20 MG tablet Take 2 tablets (40 mg total) by mouth daily with breakfast. 10 tablet 0   promethazine-dextromethorphan (PROMETHAZINE-DM) 6.25-15 MG/5ML syrup Take 5 mLs by mouth 4 (four) times daily as needed. 100 mL 0   Red Yeast Rice Extract (RED YEAST RICE PO) Take 1 tablet by mouth daily.      Respiratory Therapy Supplies (CARETOUCH CPAP & BIPAP HOSE) MISC by Does not apply route. CPAP Titration @ 16 for pressure.     Riboflavin (VITAMIN B-2 PO) Take 1 tablet by mouth daily.      rizatriptan (MAXALT) 10 MG tablet Take 1 tablet (10 mg total) by mouth 3 (three) times daily as needed for migraine. (Patient taking differently: Take 10 mg by mouth as needed for migraine.) 10 tablet 5   Tretinoin, Facial Wrinkles, (RENOVA) 0.02 % CREA Apply 1 application topically at bedtime.     triamcinolone cream (KENALOG) 0.1 % Apply 1 application topically 2 (two) times  daily as needed. (Patient taking differently: Apply 1 application  topically 2 (two) times daily as needed (rash).) 45 g 0   triamterene-hydrochlorothiazide (MAXZIDE-25) 37.5-25 MG tablet Take 1 tablet by mouth daily. 90 tablet 3   venlafaxine XR (EFFEXOR XR) 75 MG 24 hr capsule Take 1 capsule (75 mg total) by mouth daily with breakfast. 30 capsule 5   zonisamide (ZONEGRAN) 100 MG capsule Take 2 capsules (200 mg total) by mouth daily. 60 capsule 5   insulin aspart (NOVOLOG FLEXPEN) 100 UNIT/ML FlexPen Inject 12 Units into the skin 3 (three) times daily with meals. 30 mL 4   lisinopril (ZESTRIL) 5 MG tablet Take 1 tablet (5 mg total) by mouth daily. (Patient not taking: Reported on 05/03/2023) 90  tablet 1   No current facility-administered medications for this visit.    PHYSICAL EXAM: Vitals:   12/09/23 0920  BP: 130/72  Pulse: 68  Resp: 20  SpO2: 99%  Weight: 211 lb (95.7 kg)  Height: 5\' 4"  (1.626 m)      Body mass index is 36.22 kg/m.  Wt Readings from Last 3 Encounters:  12/09/23 211 lb (95.7 kg)  09/17/23 209 lb 3.2 oz (94.9 kg)  09/15/23 212 lb 12.8 oz (96.5 kg)    General: Well developed, well nourished female in no apparent distress.  HEENT: AT/[place], no external lesions.  Eyes: Conjunctiva clear and no icterus. Neck: Neck supple  Lungs: Respirations not labored Neurologic: Alert, oriented, normal speech Extremities / Skin: Dry.  No edema. Psychiatric: Does not appear depressed or anxious  Diabetic Foot Exam - Simple   No data filed    LABS Reviewed Lab Results  Component Value Date   HGBA1C 7.8 (A) 12/09/2023   HGBA1C 8.1 (H) 09/17/2023   HGBA1C 9.8 (A) 07/09/2023   No results found for: "FRUCTOSAMINE" Lab Results  Component Value Date   CHOL 217 (H) 09/17/2023   HDL 48.30 09/17/2023   LDLCALC 139 (H) 09/17/2023   LDLDIRECT 172.0 04/08/2023   TRIG 146.0 09/17/2023   CHOLHDL 4 09/17/2023   Lab Results  Component Value Date   MICRALBCREAT 7.0 04/07/2023    MICRALBCREAT 0.5 03/04/2023   Lab Results  Component Value Date   CREATININE 0.76 09/17/2023   Lab Results  Component Value Date   GFR 88.96 09/17/2023    ASSESSMENT / PLAN  1. Uncontrolled type 2 diabetes mellitus with hyperglycemia, with long-term current use of insulin (HCC)   2. Acquired autoimmune hypothyroidism   3. Mixed hyperlipidemia   4. Obesity (BMI 30-39.9)      Diabetes Mellitus type 2, complicated by no known complications. - Diabetic status / severity: Uncontrolled  Lab Results  Component Value Date   HGBA1C 7.8 (A) 12/09/2023   Discussed about type 2 diabetes mellitus and importance of better control to avoid chronic diabetic complications.  Patient does not want to go back on Ozempic or GLP-1 receptor agonist due to potential side effects.  She has complaints of occasional vaginal candidiasis, I would like to avoid SGLT2 inhibitor at this time.  Blood sugar log reviewed.  Mostly acceptable fasting blood sugar and hyperglycemia at bedtime.  - Medications: See below  I) continue Tresiba from 32 units 2 times a day. II) continue glimepiride 4 mg daily. III) increase NovoLog 8 - 10 units to 10 to 12 units with meals 3 times a day.  - Home glucose testing: 3-4 times a day before meals and at bedtime.  Discussed about continuous glucose monitoring, will consider in the future.  Asked to bring glucometer in the follow-up visit.  - Discussed/ Gave Hypoglycemia treatment plan.  # Consult : not required at this time.  # Annual urine for microalbuminuria/ creatinine ratio, no microalbuminuria currently. Last  Lab Results  Component Value Date   MICRALBCREAT 7.0 04/07/2023    # Foot check nightly.  # Annual dilated diabetic eye exams.   - Diet: Make healthy diabetic food choices - Life style / activity / exercise: Discussed.  2. Blood pressure  -  BP Readings from Last 1 Encounters:  12/09/23 130/72    - Control is in target.  - No change in  current plans.  3. Lipid status / Hyperlipidemia - Last  Lab Results  Component  Value Date   LDLCALC 139 (H) 09/17/2023   -Patient reports had side effect mainly muscle ache with atorvastatin, simvastatin and ? rosuvastatin.  Repatha was prescribed in the last visit however not covered by insurance.  She does not want to take Repatha anyway.  She want to try statin therapy. -Continue pravastatin 10 mg daily.   -Check lipid panel in 1 month.  # Primary hypothyroidism : -Currently taking levothyroxine 112 mcg 2 tablets daily.  Dose was increased when TSH was 30 in June.  Recent TSH improved however is still elevated to 10 as of August 1.  She reports compliance.  She still has complaints of fatigue.  She reports that when she was on Synthroid used to have regulated and normal thyroid function test and thyroid hormone levels.  With the generic levothyroxine she has not been feeling good. -She is currently taking Synthroid 112 mcg 2 tablets daily. -She had thyroid function test in January with low TSH and elevated free T4.  Patient reports she had missed taking thyroid medication for about a week 2 weeks ago.  She denies palpitation or heat intolerance. -No plan for thyroid lab today.  Patient is asked to complete lab in 4 weeks, order placed.  # Obesity, BMI 36 -Patient was referred to weight management clinic, patient reports not able to be seen due to insurance not covering it.  She wants to try phentermine. -Discussed in detail about lifestyle modification with diet control and exercise. -Will plan for short-term trial of phentermine, phentermine 15 mg daily.  Discussed about potential side effects and asked to call our clinic if it develops.   Diagnoses and all orders for this visit:  Uncontrolled type 2 diabetes mellitus with hyperglycemia, with long-term current use of insulin (HCC) -     POCT glycosylated hemoglobin (Hb A1C) -     insulin aspart (NOVOLOG FLEXPEN) 100 UNIT/ML FlexPen;  Inject 12 Units into the skin 3 (three) times daily with meals. -     Insulin Pen Needle 32G X 4 MM MISC; Use 4x a day  Acquired autoimmune hypothyroidism -     T4, free; Future -     TSH; Future  Mixed hyperlipidemia -     Lipid panel; Future  Obesity (BMI 30-39.9) -     phentermine 15 MG capsule; Take 1 capsule (15 mg total) by mouth every morning.    Patient will complete lab at [facility] locally in 4 weeks as ordered.  DISPOSITION Follow up in clinic in 3 months suggested.   All questions answered and patient verbalized understanding of the plan.  [place] Amy Sladek, MD [facility] Endocrinology [facility] [address] [place], [address] [place], [place] [address] Phone # [phone]  At least part of this note was generated using voice recognition software. Inadvertent word errors may have occurred, which were not recognized during the proofreading process.

## 2023-12-16 ENCOUNTER — Encounter: Payer: Self-pay | Admitting: Family Medicine

## 2023-12-16 ENCOUNTER — Encounter (INDEPENDENT_AMBULATORY_CARE_PROVIDER_SITE_OTHER): Payer: Self-pay

## 2023-12-23 ENCOUNTER — Other Ambulatory Visit (HOSPITAL_COMMUNITY): Payer: Self-pay

## 2024-01-04 ENCOUNTER — Encounter: Payer: Self-pay | Admitting: Primary Care

## 2024-01-04 ENCOUNTER — Ambulatory Visit: Admitting: Primary Care

## 2024-01-04 VITALS — BP 127/85 | HR 76 | Temp 98.6°F | Ht 63.0 in | Wt 211.8 lb

## 2024-01-04 DIAGNOSIS — J4520 Mild intermittent asthma, uncomplicated: Secondary | ICD-10-CM

## 2024-01-04 DIAGNOSIS — G4733 Obstructive sleep apnea (adult) (pediatric): Secondary | ICD-10-CM | POA: Diagnosis not present

## 2024-01-04 MED ORDER — ESZOPICLONE 1 MG PO TABS: 1.0000 mg | ORAL_TABLET | Freq: Every evening | ORAL | 0 refills | Status: AC | PRN

## 2024-01-04 NOTE — Progress Notes (Signed)
 @Patient  ID: Amy Lester, female    DOB: 18-Dec-1970, 53 y.o.   MRN: [id number]  Chief Complaint  Patient presents with   Follow-up    OSA and Asthma F/U  Complains of S.O.B with exertion - moderate- x1 year    Referring provider: Joanne Muckle, FNP  HPI: 53 year old female, never smoked. PMH significant for HTN, OSA, mild intermittent asthma, type 2 diabetes, hypothyroidism, hyperlipidemia. Patient of Dr. Matilde Son.   Previous [facility] pulmonary encounter:  08/19/2022 Patient presents today for OV follow for OSA. She was last seen on 06/23/2019 by Dr. Matilde Son. HST 05/18/19 showed severe OSA, AHI 46/5/hr with SpO2 low 89%. She received message stating motor life had been exceeded. She has been renting a CPAP machine from a place in [place]. She needs local DME company, she needs new CPAP machine and supplies. She is sleeping well at night. No issues with pressure settings. She uses F&P full face mask size medium.   Airview download 04/29/2022 - 07/27/2022 Usage days 88/90 days (98%); 87 days (97%) greater than 4 hours Average usage 10 hours 13 minutes Pressure 16 to 20 cm H2O (16.6 cm H2O-95%) Air leaks 13.4 L/min (95%) AHI 0.3  Rental PAP machine 07/27/2022 - 08/11/2022 16/16 days 100%) greater than 4 hours Average usage 10 hours 38 minutes Pressure 16 to 20 cm H2O (17.3 cm H2O-95%) Air leaks 34.1 L/min (95%) AHI 0.3  11/18/2022 Patient presents today for CPAP compliance. She received new CPAP in February 2024. Overall she sleeps well. No excessive daytime sleepiness. She uses F&P full face mask size medium. She needs an order for bacterial air filters.   She experiences intermittent wheezing symptoms which are currently exacerbated by seasonal allergies. She is taking Singulair  10mg  daily. No shortness of breath.   Airview Oct 19, 2022 - November 17, 2022 29/29 (100); 100 > 4 hours Average usage 10 hours 23 sec Pressure 16-20cm h20 AIRLEAKS 11L/min Average AHI 0.17   01/04/2024  interim Discussed the use of AI scribe software for clinical note transcription with the patient, who gave verbal consent to proceed.  History of Present Illness   Amy Lester is a 53 year old female with sleep apnea and asthma who presents with sleep disturbances and CPAP equipment issues.  She experiences sleep disturbances characterized by nocturnal awakenings, increased nocturnal urination, and difficulty returning to sleep due to racing thoughts. Despite using her CPAP machine, she often wakes up tired, although not every day. She attributes her sleep issues to increased stress in her life, including personal and legal matters involving her family. She spends nine to ten hours in bed but only sleeps for about six hours.  She expresses frustration with her CPAP equipment, noting that the rubber part of the tube often detaches, requiring duct tape for repairs. She is concerned about running the machine without a filter, as she only receives one filter every six months despite needing monthly changes. Additionally, she receives replacement hoses every six months instead of every three months, which she believes contributes to equipment wear and tear.  She is currently using Lexapro  for anxiety, managed by her primary care provider. She has tried melatonin and milk thistle in the past for sleep but is not currently using them. She is undergoing therapy but feels it has not made a significant difference due to ongoing stressors in her life.  She uses Singulair  and Zyrtec  for allergies but refuses to use nasal sprays due to a negative past  experience. She mentions that her mind feels foggy, possibly due to allergies.      Airview download 12/04/2023 - 01/02/2024 Usage days 30/30 days (100%) Average usage 10 hours 54 minutes Pressure 16 to 17 cm H2O Air leak 7.3 L/min (95%) AHI 0.3  Allergies  Allergen Reactions   Crestor [Rosuvastatin]     myalgia   Lipitor [Atorvastatin ]     myalgia    Percocet [Oxycodone-Acetaminophen ] Nausea And Vomiting   Zocor [Simvastatin]     myalgia    Immunization History  Administered Date(s) Administered   Influenza,inj,Quad PF,6+ Mos 07/05/2019    Past Medical History:  Diagnosis Date   Asthma    Blood transfusion without reported diagnosis    Chronic migraine    Common migraine with intractable migraine 02/16/2019   Diabetes mellitus without complication (HCC)    Endometriosis    Fatty liver    GERD (gastroesophageal reflux disease)    Hyperlipidemia    Hypertension    Hypothyroidism    Incontinence    Irregular heartbeat    Multiple sclerosis (HCC)    Palpitations    Postconcussive syndrome 05/09/2020   Pre-diabetes    Sleep apnea     Tobacco History: Social History   Tobacco Use  Smoking Status Never   Passive exposure: Past  Smokeless Tobacco Never   Counseling given: Not Answered   Outpatient Medications Prior to Visit  Medication Sig Dispense Refill   acetaminophen  (TYLENOL ) 500 MG tablet Take 500 mg by mouth as needed for moderate pain.     albuterol  (PROVENTIL ) (2.5 MG/3ML) 0.083% nebulizer solution Take 3 mLs (2.5 mg total) by nebulization every 6 (six) hours as needed for wheezing or shortness of breath. 75 mL 3   albuterol  (VENTOLIN  HFA) 108 (90 Base) MCG/ACT inhaler Inhale 1-2 puffs into the lungs every 6 (six) hours as needed for wheezing or shortness of breath. 18 g 0   aspirin  EC 81 MG tablet Take 1 tablet (81 mg total) by mouth daily. Swallow whole.     calcium  carbonate (TUMS - DOSED IN MG ELEMENTAL CALCIUM ) 500 MG chewable tablet Chew 2-3 tablets by mouth daily as needed for indigestion or heartburn.     cetirizine  (ZYRTEC ) 10 MG tablet Take 1 tablet (10 mg total) by mouth daily. 90 tablet 0   ciprofloxacin (CILOXAN) 0.3 % ophthalmic solution Place into both eyes daily.     clotrimazole -betamethasone  (LOTRISONE ) cream Apply 1 application topically as needed. (Patient taking differently: Apply 1  application  topically as needed (Outbreak).) 45 g 2   Continuous Glucose Receiver (FREESTYLE LIBRE 3 READER) DEVI Use to monitor blood sugar 1 each 0   Continuous Glucose Sensor (FREESTYLE LIBRE 3 SENSOR) MISC 1 Device by Does not apply route every 14 (fourteen) days. Apply 1 sensor on upper arm every 14 days for continuous glucose monitoring 2 each 2   Cyanocobalamin  (B-12 COMPLIANCE INJECTION IJ) Inject 1 Dose as directed every 30 (thirty) days.     escitalopram  (LEXAPRO ) 20 MG tablet Take 1 tablet (20 mg total) by mouth daily. 30 tablet 3   Evolocumab  (REPATHA ) 140 MG/ML SOSY Inject contents of pen twice a month in abdominal skin 2 mL 5   ezetimibe  (ZETIA ) 10 MG tablet Take 1 tablet (10 mg total) by mouth daily. 90 tablet 3   fluconazole  (DIFLUCAN ) 150 MG tablet Take one tab now.  Repeat dose in 3 days if needed. 2 tablet 0   Fremanezumab -vfrm (AJOVY ) 225 MG/1.5ML SOAJ Inject 225 mg  into the skin every 28 (twenty-eight) days. 1.68 mL 11   glimepiride  (AMARYL ) 4 MG tablet Take 1 tablet (4 mg total) by mouth daily before breakfast. 90 tablet 3   ibuprofen (ADVIL) 200 MG tablet Take 600-800 mg by mouth as needed for mild pain or moderate pain.     insulin  aspart (NOVOLOG  FLEXPEN) 100 UNIT/ML FlexPen Inject 12 Units into the skin 3 (three) times daily with meals. 30 mL 4   insulin  degludec (TRESIBA  FLEXTOUCH) 200 UNIT/ML FlexTouch Pen Inject 32 Units into the skin in the morning and at bedtime. 30 mL 3   Insulin  Pen Needle 32G X 4 MM MISC Use 5x a day 300 each 3   Insulin  Pen Needle 32G X 4 MM MISC Use 4x a day 300 each 3   levothyroxine  (SYNTHROID ) 112 MCG tablet Take 2 tablets (224 mcg total) by mouth daily. 180 tablet 3   MAGNESIUM OXIDE PO Take 1 tablet by mouth daily.     metoprolol  tartrate (LOPRESSOR ) 25 MG tablet Take 1 tablet (25 mg total) by mouth in the morning. 90 tablet 3   metoprolol  tartrate (LOPRESSOR ) 50 MG tablet Take 1 tablet (50 mg total) by mouth at bedtime. 90 tablet 3    metroNIDAZOLE  (FLAGYL ) 500 MG tablet Take 1 tablet (500 mg total) by mouth 2 (two) times daily. 14 tablet 0   metroNIDAZOLE  (METROGEL ) 1 % gel Apply 1 application topically at bedtime.      montelukast  (SINGULAIR ) 10 MG tablet Take 10 mg by mouth every morning.     Multiple Vitamins-Minerals (MULTIVITAMIN WITH MINERALS) tablet Take 1 tablet by mouth daily.     nitroGLYCERIN  (NITROSTAT ) 0.4 MG SL tablet Place 1 tablet (0.4 mg total) under the tongue every 5 (five) minutes as needed for chest pain. 25 tablet 3   nystatin cream (MYCOSTATIN) Apply 1 application topically daily as needed for dry skin.     Olopatadine HCl (PAZEO OP) Place 1 drop into both eyes daily.     omega-3 acid ethyl esters (LOVAZA) 1 g capsule Take 1 g by mouth 2 (two) times daily.     omeprazole  (PRILOSEC) 40 MG capsule Take 1 capsule (40 mg total) by mouth 2 (two) times daily before a meal. 60 capsule 3   ondansetron  (ZOFRAN ) 4 MG tablet Take 1 tablet (4 mg total) by mouth every 8 (eight) hours as needed for nausea or vomiting. (Patient taking differently: Take 4 mg by mouth as needed for nausea or vomiting.) 20 tablet 1   ondansetron  (ZOFRAN ) 8 MG tablet Take 1 tablet (8 mg total) by mouth every 8 (eight) hours as needed for nausea or vomiting. 20 tablet 1   phentermine  15 MG capsule Take 1 capsule (15 mg total) by mouth every morning. 30 capsule 2   pravastatin  (PRAVACHOL ) 10 MG tablet Take 1 tablet (10 mg total) by mouth daily. 90 tablet 3   promethazine -dextromethorphan (PROMETHAZINE -DM) 6.25-15 MG/5ML syrup Take 5 mLs by mouth 4 (four) times daily as needed. 100 mL 0   Red Yeast Rice Extract (RED YEAST RICE PO) Take 1 tablet by mouth daily.      Respiratory Therapy Supplies (CARETOUCH CPAP & BIPAP HOSE) MISC by Does not apply route. CPAP Titration @ 16 for pressure.     Riboflavin (VITAMIN B-2 PO) Take 1 tablet by mouth daily.      rizatriptan  (MAXALT ) 10 MG tablet Take 1 tablet (10 mg total) by mouth 3 (three) times daily  as needed for migraine. (  Patient taking differently: Take 10 mg by mouth as needed for migraine.) 10 tablet 5   Tretinoin, Facial Wrinkles, (RENOVA) 0.02 % CREA Apply 1 application topically at bedtime.     triamcinolone  cream (KENALOG ) 0.1 % Apply 1 application topically 2 (two) times daily as needed. (Patient taking differently: Apply 1 application  topically 2 (two) times daily as needed (rash).) 45 g 0   triamterene -hydrochlorothiazide  (MAXZIDE-25) 37.5-25 MG tablet Take 1 tablet by mouth daily. 90 tablet 3   zonisamide  (ZONEGRAN ) 100 MG capsule Take 2 capsules (200 mg total) by mouth daily. 60 capsule 5   lisinopril  (ZESTRIL ) 5 MG tablet Take 1 tablet (5 mg total) by mouth daily. (Patient not taking: Reported on 05/03/2023) 90 tablet 1   fluticasone  (FLONASE ) 50 MCG/ACT nasal spray Place 1 spray into both nostrils 2 (two) times daily. (Patient not taking: Reported on 01/04/2024) 16 g 2   MELATONIN PO Take 1 capsule by mouth at bedtime as needed (sleep). (Patient not taking: Reported on 01/04/2024)     No facility-administered medications prior to visit.   Review of Systems  Review of Systems  Constitutional:  Positive for fatigue.  HENT: Negative.    Respiratory: Negative.  Negative for cough, shortness of breath and wheezing.   Cardiovascular: Negative.   Psychiatric/Behavioral:  Positive for sleep disturbance.    Physical Exam  Pulse 76   SpO2 96%  Physical Exam Constitutional:      General: She is not in acute distress.    Appearance: Normal appearance. She is not ill-appearing.  HENT:     Head: Normocephalic and atraumatic.  Cardiovascular:     Rate and Rhythm: Normal rate and regular rhythm.  Pulmonary:     Effort: Pulmonary effort is normal.     Breath sounds: Normal breath sounds.  Musculoskeletal:        General: Normal range of motion.  Skin:    General: Skin is warm and dry.  Neurological:     General: No focal deficit present.     Mental Status: She is alert and  oriented to person, place, and time. Mental status is at baseline.  Psychiatric:        Mood and Affect: Mood normal.        Behavior: Behavior normal.        Thought Content: Thought content normal.        Judgment: Judgment normal.      Lab Results:  CBC    Component Value Date/Time   WBC 7.1 09/17/2023 1025   RBC 5.02 09/17/2023 1025   HGB 14.7 09/17/2023 1025   HCT 44.6 09/17/2023 1025   PLT 316.0 09/17/2023 1025   MCV 88.9 09/17/2023 1025   MCH 29.3 07/24/2020 1025   MCHC 32.9 09/17/2023 1025   RDW 13.1 09/17/2023 1025   LYMPHSABS 1.4 09/17/2023 1025   MONOABS 0.4 09/17/2023 1025   EOSABS 0.2 09/17/2023 1025   BASOSABS 0.0 09/17/2023 1025    BMET    Component Value Date/Time   NA 137 12/09/2023 1417   NA 138 11/24/2022 0000   K 4.4 12/09/2023 1417   CL 102 12/09/2023 1417   CO2 26 12/09/2023 1417   GLUCOSE 134 (H) 12/09/2023 1417   BUN 12 12/09/2023 1417   BUN 20 11/24/2022 0000   CREATININE 0.69 12/09/2023 1417   CALCIUM  9.3 12/09/2023 1417   GFRNONAA >60 07/13/2023 0940   GFRAA >60 02/13/2020 0011    BNP No results found for: "BNP"  ProBNP No results found for: "PROBNP"  Imaging: No results found.   Assessment & Plan:   1. OSA on CPAP (Primary) - AMB REFERRAL FOR DME - AMB REFERRAL FOR DME  2. Mild intermittent asthma without complication - AMB REFERRAL FOR DME - AMB REFERRAL FOR DME  Assessment and Plan    Asthma Asthma is well-managed with Singulair  and Zyrtec . No exacerbations or acute symptoms reported. - Renew nebulizer supplies   Sleep apnea Sleep apnea is well-controlled with CPAP therapy. Current pressure 16-17cm h20 with residual AHI 0.3/hour. She reports frustration with CPAP supplies, including inadequate frequency of replacement parts and filters, which may affect the machine's performance. - Renew CPAP supplies and specify correct amounts: filters once a month, tubing every three months, headgear every six months. - Advise  her to consider purchasing supplies from [employer] or [employer] if insurance does not cover frequent replacements.  Situational insomnia due to psychological stress Insomnia is situational, likely due to psychological stress from recent personal events. She reports difficulty staying asleep and waking up tired. Previously used melatonin but not currently. Considering Lunesta as a short-term sleep aid. Discussed potential for dependency with sleep aids and advised cautious use. She is advised to take Lunesta no more than three times a week to minimize dependency risk. - Prescribe Lunesta 1mg  qhs, 30 tablets, no refills, to be taken as needed, no more than three times a week. - Advise her to ensure at least four hours of sleep after taking Lunesta and to avoid taking it after midnight or 1 AM. - Continue behavioral therapy/counseling for anxiety  - Schedule follow-up in three months to reassess insomnia.   Antonio Baumgarten, NP 01/04/2024

## 2024-01-04 NOTE — Patient Instructions (Addendum)
 YOUR PLAN: -ASTHMA: Your asthma is well-managed with your current medications, Singulair  and Zyrtec . You have not reported any recent flare-ups or acute symptoms.  -SLEEP APNEA: Your sleep apnea is well-controlled with your CPAP machine, but you are experiencing issues with the equipment. We will renew your CPAP supplies and ensure you receive the correct amounts: mask cushion monthly, filters once a month, tubing every three months, and headgear every six months. If your insurance does not cover frequent replacements, consider purchasing supplies from [email or web address] or [email or web address].  -SITUATIONAL INSOMNIA DUE TO PSYCHOLOGICAL STRESS: Your insomnia is likely due to recent personal stress. We discussed using Lunesta as a short-term sleep aid. You should take Lunesta no more than three times a week to avoid dependency. Ensure you have at least four hours of sleep after taking it and avoid taking it after midnight or 1 AM. We will reassess your insomnia in three months.  INSTRUCTIONS: Please follow up in three months to reassess your insomnia. If you have any issues with your CPAP supplies or experience any changes in your asthma symptoms, contact us  immediately.  Orders Renew CPAP and nebulizer supplies  Follow-up 3-4 months CPAP compliance/insomnia with Beth NP  CPAP and BIPAP Information CPAP and BIPAP use air pressure to keep your airways open and help you breathe well. CPAP and BIPAP use different amounts of pressure. Your health care provider will tell you whether CPAP or BIPAP would be best for you. CPAP stands for continuous positive airway pressure. With CPAP, the amount of pressure stays the same while you breathe in and out. BIPAP stands for bi-level positive airway pressure. With BIPAP, the amount of pressure will be higher when you breathe in and lower when you breathe out. This allows you to take bigger breaths. CPAP or BIPAP may be used in the hospital or at home. You may need to have a  sleep study before your provider can order a device for you to use at home. What are the advantages? CPAP and BIPAP are most often used for obstructive sleep apnea to keep the airways from collapsing when the muscles relax during sleep. CPAP or BIPAP can be used if you have: Chronic obstructive pulmonary disease. Heart failure. Medical conditions that cause muscle weakness. Other problems that cause breathing to be shallow, weak, or difficult. What are the risks? Your provider will talk with you about risks. These may include: Sores on your nose or face caused from the mask, prongs, or nasal pillows. Dry or stuffy nose or nosebleeds. Feeling gassy or bloated. Sinus or lung infection if the equipment is not cleaned well. When should CPAP or BIPAP be used? In most cases, CPAP or BIPAP is used during sleep at night or whenever the main sleep time happens. It's also used during naps. People with some medical conditions may need to wear the mask when they're awake. Follow instructions from your provider about when to use your CPAP or BIPAP. What happens during CPAP or BIPAP?  Both CPAP and BIPAP use a small machine that uses electricity to create air pressure. A long tube connects the device to a plastic mask. Air is blown through the mask into your nose or mouth. The amount of pressure that's used to blow the air can be adjusted. Your provider will set the pressure setting and help you find the best mask for you. Tips for using the mask There are different types and sizes of masks. If your mask does not fit well, talk with your  provider about getting a different one. Some common types of masks include: Full face masks, which fit over the mouth and nose. Nasal masks, which fit over the nose. Nasal pillow or prong masks, which fit into the nostrils. The mask needs to be snug to your face, so some people feel trapped or closed in at first. If you feel this way, you may need to get used to the  mask. Hold the mask loosely over your nose or mouth and then gradually put the the mask on more snugly. Slowly increase the amount of time you use the mask. If you have trouble with your mask not fitting well or leaking, talk with your provider. Do not stop using the mask. Tips for using the device Follow instructions from your provider about how to and how often to use the device. For home use, CPAP and BIPAP devices come from home health care companies. There are many different brands. Your health insurance company will help to decide which device you get. Keep the CPAP or BIPAP device and attachments clean. Ask your home health care company or check the instruction book for cleaning instructions. Make sure the humidifier is filled with germ-free (sterile) water and is working correctly. This will help prevent a dry or stuffy nose or nosebleeds. A nasal saline mist or spray may keep your nose from getting dry and sore. Do not eat or drink while the CPAP or BIPAP device is on. Food or drinks could get pushed into your lungs by the pressure of the CPAP or BIPAP. Follow these instructions at home: Take over-the-counter and prescription medicines only as told by your provider. Do not smoke, vape, or use nicotine or tobacco. Contact a health care provider if: You have redness or pressure sores on your head, face, mouth, or nose from the mask or headgear. You have trouble using the CPAP or BIPAP device. You have trouble going to sleep or staying asleep. Someone tells you that you snore even when wearing your CPAP or BIPAP device. Get help right away if: You have trouble breathing. You feel confused. These symptoms may be an emergency. Get help right away. Call 911. Do not wait to see if the symptoms will go away. Do not drive yourself to the hospital. This information is not intended to replace advice given to you by your health care provider. Make sure you discuss any questions you have with  your health care provider. Document Revised: 12/16/2022 Document Reviewed: 12/16/2022 Elsevier Patient Education  2024 [employer]  Insomnia Insomnia is a sleep disorder that makes it difficult to fall asleep or stay asleep. Insomnia can cause fatigue, low energy, difficulty concentrating, mood swings, and poor performance at work or school. There are three different ways to classify insomnia: Difficulty falling asleep. Difficulty staying asleep. Waking up too early in the morning. Any type of insomnia can be long-term (chronic) or short-term (acute). Both are common. Short-term insomnia usually lasts for 3 months or less. Chronic insomnia occurs at least three times a week for longer than 3 months. What are the causes? Insomnia may be caused by another condition, situation, or substance, such as: Having certain mental health conditions, such as anxiety and depression. Using caffeine, alcohol, tobacco, or drugs. Having gastrointestinal conditions, such as gastroesophageal reflux disease (GERD). Having certain medical conditions. These include: Asthma. Alzheimer's disease. Stroke. Chronic pain. An overactive thyroid  gland (hyperthyroidism). Other sleep disorders, such as restless legs syndrome and sleep apnea. Menopause. Sometimes, the cause of insomnia  may not be known. What increases the risk? Risk factors for insomnia include: Gender. Females are affected more often than males. Age. Insomnia is more common as people get older. Stress and certain medical and mental health conditions. Lack of exercise. Having an irregular work schedule. This may include working night shifts and traveling between different time zones. What are the signs or symptoms? If you have insomnia, the main symptom is having trouble falling asleep or having trouble staying asleep. This may lead to other symptoms, such as: Feeling tired or having low energy. Feeling nervous about going to sleep. Not feeling  rested in the morning. Having trouble concentrating. Feeling irritable, anxious, or depressed. How is this diagnosed? This condition may be diagnosed based on: Your symptoms and medical history. Your health care provider may ask about: Your sleep habits. Any medical conditions you have. Your mental health. A physical exam. How is this treated? Treatment for insomnia depends on the cause. Treatment may focus on treating an underlying condition that is causing the insomnia. Treatment may also include: Medicines to help you sleep. Counseling or therapy. Lifestyle adjustments to help you sleep better. Follow these instructions at home: Eating and drinking  Limit or avoid alcohol, caffeinated beverages, and products that contain nicotine and tobacco, especially close to bedtime. These can disrupt your sleep. Do not eat a large meal or eat spicy foods right before bedtime. This can lead to digestive discomfort that can make it hard for you to sleep. Sleep habits  Keep a sleep diary to help you and your health care provider figure out what could be causing your insomnia. Write down: When you sleep. When you wake up during the night. How well you sleep and how rested you feel the next day. Any side effects of medicines you are taking. What you eat and drink. Make your bedroom a dark, comfortable place where it is easy to fall asleep. Put up shades or blackout curtains to block light from outside. Use a white noise machine to block noise. Keep the temperature cool. Limit screen use before bedtime. This includes: Not watching TV. Not using your smartphone, tablet, or computer. Stick to a routine that includes going to bed and waking up at the same times every day and night. This can help you fall asleep faster. Consider making a quiet activity, such as reading, part of your nighttime routine. Try to avoid taking naps during the day so that you sleep better at night. Get out of bed if you  are still awake after 15 minutes of trying to sleep. Keep the lights down, but try reading or doing a quiet activity. When you feel sleepy, go back to bed. General instructions Take over-the-counter and prescription medicines only as told by your health care provider. Exercise regularly as told by your health care provider. However, avoid exercising in the hours right before bedtime. Use relaxation techniques to manage stress. Ask your health care provider to suggest some techniques that may work well for you. These may include: Breathing exercises. Routines to release muscle tension. Visualizing peaceful scenes. Make sure that you drive carefully. Do not drive if you feel very sleepy. Keep all follow-up visits. This is important. Contact a health care provider if: You are tired throughout the day. You have trouble in your daily routine due to sleepiness. You continue to have sleep problems, or your sleep problems get worse. Get help right away if: You have thoughts about hurting yourself or someone else. Get help right  away if you feel like you may hurt yourself or others, or have thoughts about taking your own life. Go to your nearest emergency room or: Call 911. Call the National Suicide Prevention Lifeline at 5803722302 or 988. This is open 24 hours a day. Text the Crisis Text Line at (901) 272-4745. Summary Insomnia is a sleep disorder that makes it difficult to fall asleep or stay asleep. Insomnia can be long-term (chronic) or short-term (acute). Treatment for insomnia depends on the cause. Treatment may focus on treating an underlying condition that is causing the insomnia. Keep a sleep diary to help you and your health care provider figure out what could be causing your insomnia. This information is not intended to replace advice given to you by your health care provider. Make sure you discuss any questions you have with your health care provider. Document Revised: 08/04/2021 Document  Reviewed: 08/04/2021 Elsevier Patient Education  2024 [employer]                            Contains text generated by [employer].    Orders: Renew CPAP and nebulizer supplies

## 2024-02-24 ENCOUNTER — Encounter: Payer: Self-pay | Admitting: Internal Medicine

## 2024-03-16 ENCOUNTER — Emergency Department (HOSPITAL_COMMUNITY)
Admission: EM | Admit: 2024-03-16 | Discharge: 2024-03-16 | Disposition: A | Discharge status: Home / Self Care | Attending: Emergency Medicine | Admitting: Emergency Medicine

## 2024-03-16 ENCOUNTER — Emergency Department (HOSPITAL_COMMUNITY)

## 2024-03-16 ENCOUNTER — Encounter (HOSPITAL_COMMUNITY): Payer: Self-pay

## 2024-03-16 ENCOUNTER — Other Ambulatory Visit: Payer: Self-pay

## 2024-03-16 DIAGNOSIS — Z7984 Long term (current) use of oral hypoglycemic drugs: Secondary | ICD-10-CM | POA: Diagnosis not present

## 2024-03-16 DIAGNOSIS — M542 Cervicalgia: Secondary | ICD-10-CM | POA: Diagnosis not present

## 2024-03-16 DIAGNOSIS — R0781 Pleurodynia: Secondary | ICD-10-CM | POA: Insufficient documentation

## 2024-03-16 DIAGNOSIS — M25551 Pain in right hip: Secondary | ICD-10-CM | POA: Insufficient documentation

## 2024-03-16 DIAGNOSIS — W19XXXA Unspecified fall, initial encounter: Secondary | ICD-10-CM | POA: Diagnosis not present

## 2024-03-16 DIAGNOSIS — R079 Chest pain, unspecified: Secondary | ICD-10-CM | POA: Insufficient documentation

## 2024-03-16 DIAGNOSIS — Y92009 Unspecified place in unspecified non-institutional (private) residence as the place of occurrence of the external cause: Secondary | ICD-10-CM | POA: Insufficient documentation

## 2024-03-16 DIAGNOSIS — M25511 Pain in right shoulder: Secondary | ICD-10-CM | POA: Diagnosis not present

## 2024-03-16 DIAGNOSIS — J45909 Unspecified asthma, uncomplicated: Secondary | ICD-10-CM | POA: Diagnosis not present

## 2024-03-16 DIAGNOSIS — R519 Headache, unspecified: Secondary | ICD-10-CM | POA: Insufficient documentation

## 2024-03-16 DIAGNOSIS — Z794 Long term (current) use of insulin: Secondary | ICD-10-CM | POA: Insufficient documentation

## 2024-03-16 DIAGNOSIS — R55 Syncope and collapse: Secondary | ICD-10-CM | POA: Diagnosis not present

## 2024-03-16 DIAGNOSIS — E119 Type 2 diabetes mellitus without complications: Secondary | ICD-10-CM | POA: Insufficient documentation

## 2024-03-16 DIAGNOSIS — Z7982 Long term (current) use of aspirin: Secondary | ICD-10-CM | POA: Insufficient documentation

## 2024-03-16 DIAGNOSIS — R42 Dizziness and giddiness: Secondary | ICD-10-CM | POA: Diagnosis present

## 2024-03-16 LAB — COMPREHENSIVE METABOLIC PANEL WITH GFR
ALT: 24 U/L (ref 0–44)
AST: 27 U/L (ref 15–41)
Albumin: 3.7 g/dL (ref 3.5–5.0)
Alkaline Phosphatase: 123 U/L (ref 38–126)
Anion gap: 12 (ref 5–15)
BUN: 16 mg/dL (ref 6–20)
CO2: 21 mmol/L — ABNORMAL LOW (ref 22–32)
Calcium: 9.3 mg/dL (ref 8.9–10.3)
Chloride: 106 mmol/L (ref 98–111)
Creatinine, Ser: 1.06 mg/dL — ABNORMAL HIGH (ref 0.44–1.00)
GFR, Estimated: >60 mL/min (ref >60)
Glucose, Bld: 199 mg/dL — ABNORMAL HIGH (ref 70–99)
Potassium: 3.9 mmol/L (ref 3.5–5.1)
Sodium: 139 mmol/L (ref 135–145)
Total Bilirubin: 0.3 mg/dL (ref 0.0–1.2)
Total Protein: 8.1 g/dL (ref 6.5–8.1)

## 2024-03-16 LAB — CBC WITH DIFFERENTIAL/PLATELET
Abs Immature Granulocytes: 0.02 K/uL (ref 0.00–0.07)
Basophils Absolute: 0 K/uL (ref 0.0–0.1)
Basophils Relative: 1 %
Eosinophils Absolute: 0.2 K/uL (ref 0.0–0.5)
Eosinophils Relative: 3 %
HCT: 43.7 % (ref 36.0–46.0)
Hemoglobin: 14.5 g/dL (ref 12.0–15.0)
Immature Granulocytes: 0 %
Lymphocytes Relative: 24 %
Lymphs Abs: 1.7 K/uL (ref 0.7–4.0)
MCH: 29.7 pg (ref 26.0–34.0)
MCHC: 33.2 g/dL (ref 30.0–36.0)
MCV: 89.4 fL (ref 80.0–100.0)
Monocytes Absolute: 0.4 K/uL (ref 0.1–1.0)
Monocytes Relative: 6 %
Neutro Abs: 4.8 K/uL (ref 1.7–7.7)
Neutrophils Relative %: 66 %
Platelets: 321 K/uL (ref 150–400)
RBC: 4.89 MIL/uL (ref 3.87–5.11)
RDW: 13.1 % (ref 11.5–15.5)
WBC: 7.1 K/uL (ref 4.0–10.5)
nRBC: 0 % (ref 0.0–0.2)

## 2024-03-16 LAB — URINALYSIS, ROUTINE W REFLEX MICROSCOPIC
Bilirubin Urine: NEGATIVE
Glucose, UA: >=500 mg/dL — AB
Hgb urine dipstick: NEGATIVE
Ketones, ur: NEGATIVE mg/dL
Leukocytes,Ua: NEGATIVE
Nitrite: NEGATIVE
Protein, ur: NEGATIVE mg/dL
Specific Gravity, Urine: 1.03 (ref 1.005–1.030)
pH: 5 (ref 5.0–8.0)

## 2024-03-16 LAB — CBG MONITORING, ED: Glucose-Capillary: 196 mg/dL — ABNORMAL HIGH (ref 70–99)

## 2024-03-16 MED ORDER — MECLIZINE HCL 25 MG PO TABS: 25.0000 mg | ORAL_TABLET | Freq: Three times a day (TID) | ORAL | 0 refills | Status: AC | PRN

## 2024-03-16 MED ORDER — MECLIZINE HCL 12.5 MG PO TABS
25.0000 mg | ORAL_TABLET | Freq: Once | ORAL | Status: AC
  Administered 2024-03-16: 25 mg via ORAL
  Filled 2024-03-16: qty 2

## 2024-03-16 MED ORDER — ACETAMINOPHEN 500 MG PO TABS
1000.0000 mg | ORAL_TABLET | Freq: Once | ORAL | Status: AC
Start: 2024-03-16 — End: 2024-03-16
  Administered 2024-03-16: 1000 mg via ORAL
  Filled 2024-03-16: qty 2

## 2024-03-16 MED ORDER — ONDANSETRON HCL 4 MG/2ML IJ SOLN: 4.0000 mg | Freq: Once | INTRAMUSCULAR | Status: DC

## 2024-03-16 MED ORDER — LORAZEPAM 2 MG/ML IJ SOLN: 1.0000 mg | Freq: Once | INTRAMUSCULAR | Status: DC

## 2024-03-16 NOTE — ED Triage Notes (Signed)
 Pt arrived via POV from home c/o feeling dizziness, and falling at home earlier today. Pt reports she did experience LOC. Pt does endorse Hx of anxiety as well. Pt does report her right hip does hurt a little.

## 2024-03-16 NOTE — ED Notes (Signed)
 Pt does present anxious in Triage and is tearful.

## 2024-03-16 NOTE — ED Notes (Signed)
 Orthostatic vitals, IV, and medications will be done and given when pt returns from MRI

## 2024-03-16 NOTE — ED Notes (Signed)
 Patient brought back to hallway 4 from [place], patient c/o dizziness, asking for medication  Pt able to ambulate independently to wheelchair and in bathroom

## 2024-03-16 NOTE — ED Provider Notes (Signed)
 [place] EMERGENCY DEPARTMENT AT [facility] Provider Note   CSN: [id number] Arrival date & time: 03/16/24  1247     Patient presents with: Felton Johnson JAYSON Gala is a 53 y.o. female.    Fall Associated symptoms include chest pain (Right rib pain) and headaches. Pertinent negatives include no abdominal pain and no shortness of breath.       HATSUMI STEINHART is a 53 y.o. female with past medical history of type 2 diabetes, intractable chronic migraines, multiple sclerosis, asthma who presents to the Emergency Department complaining of syncopal episode that occurred this morning.  States she woke up feeling dizzy this morning has been dizzy intermittently for the last 3 days.  Describes a sensation of movement even when lying supine.  Was standing in the kitchen this morning and describes having a brief syncopal episode lasting maybe 1 to 2 minutes.  Woke with her daughter helping her up from the floor.  She describes having pain to the right side of her head, neck pain, shoulder pain, right rib and right hip pain.  Headache began after the fall.  Denies any nausea, chest pain, shortness of breath, numbness,  visual changes recent fever or chills.  No recent medication changes.  Prior to Admission medications   Medication Sig Start Date End Date Taking? Authorizing Provider  acetaminophen  (TYLENOL ) 500 MG tablet Take 500 mg by mouth as needed for moderate pain.    [provider]  albuterol  (PROVENTIL ) (2.5 MG/3ML) 0.083% nebulizer solution Take 3 mLs (2.5 mg total) by nebulization every 6 (six) hours as needed for wheezing or shortness of breath. 11/18/22   Hope Almarie ORN, NP  albuterol  (VENTOLIN  HFA) 108 (90 Base) MCG/ACT inhaler Inhale 1-2 puffs into the lungs every 6 (six) hours as needed for wheezing or shortness of breath. 12/22/21   Stuart Vernell Almarie, PA-C  aspirin  EC 81 MG tablet Take 1 tablet (81 mg total) by mouth daily. Swallow whole. 02/04/21   Richarda Prentice LITTIE Mickey., NP  calcium  carbonate (TUMS - DOSED IN MG ELEMENTAL CALCIUM ) 500 MG chewable tablet Chew 2-3 tablets by mouth daily as needed for indigestion or heartburn.    [provider]  cetirizine  (ZYRTEC ) 10 MG tablet Take 1 tablet (10 mg total) by mouth daily. 03/28/19   Comer Kirsch, PA-C  ciprofloxacin (CILOXAN) 0.3 % ophthalmic solution Place into both eyes daily. 01/07/21   [provider]  clotrimazole -betamethasone  (LOTRISONE ) cream Apply 1 application topically as needed. Patient taking differently: Apply 1 application  topically as needed (Outbreak). 04/12/19   Mercer Kirsch SAUNDERS, MD  Continuous Glucose Receiver (FREESTYLE LIBRE 3 READER) DEVI Use to monitor blood sugar 03/22/23   Von [place], MD  Continuous Glucose Sensor (FREESTYLE LIBRE 3 SENSOR) MISC 1 Device by Does not apply route every 14 (fourteen) days. Apply 1 sensor on upper arm every 14 days for continuous glucose monitoring 03/04/23   Von [place], MD  Cyanocobalamin  (B-12 COMPLIANCE INJECTION IJ) Inject 1 Dose as directed every 30 (thirty) days.    [provider]  escitalopram  (LEXAPRO ) 20 MG tablet Take 1 tablet (20 mg total) by mouth daily. 12/09/23   Mercer Kirsch SAUNDERS, MD  eszopiclone  (LUNESTA ) 1 MG TABS tablet Take 1 tablet (1 mg total) by mouth at bedtime as needed for sleep. Take immediately before bedtime 01/04/24 01/03/25  Hope Almarie ORN, NP  Evolocumab  (REPATHA ) 140 MG/ML SOSY Inject contents of pen twice a month in abdominal skin 03/22/23  Von [place], MD  ezetimibe  (ZETIA ) 10 MG tablet Take 1 tablet (10 mg total) by mouth daily. 09/28/22   Mercer Clotilda SAUNDERS, MD  fluconazole  (DIFLUCAN ) 150 MG tablet Take one tab now.  Repeat dose in 3 days if needed. 09/17/23   Mercer Clotilda SAUNDERS, MD  Fremanezumab -vfrm (AJOVY ) 225 MG/1.5ML SOAJ Inject 225 mg into the skin every 28 (twenty-eight) days. 11/10/22   Skeet Juliene SAUNDERS, DO  glimepiride  (AMARYL ) 4 MG tablet Take 1 tablet (4 mg total) by mouth daily before  breakfast. 06/10/23   Thapa, [place], MD  HUMULIN R  U-500 KWIKPEN 500 UNIT/ML KwikPen Inject 15 Units into the skin 3 (three) times daily with meals. 06/15/23   [provider]  ibuprofen (ADVIL) 200 MG tablet Take 600-800 mg by mouth as needed for mild pain or moderate pain.    [provider]  insulin  aspart (NOVOLOG  FLEXPEN) 100 UNIT/ML FlexPen Inject 12 Units into the skin 3 (three) times daily with meals. 12/09/23   Thapa, [place], MD  insulin  degludec (TRESIBA  FLEXTOUCH) 200 UNIT/ML FlexTouch Pen Inject 32 Units into the skin in the morning and at bedtime. 06/10/23   Thapa, [place], MD  Insulin  Pen Needle 32G X 4 MM MISC Use 5x a day 07/09/23   Thapa, [place], MD  Insulin  Pen Needle 32G X 4 MM MISC Use 4x a day 12/09/23   Thapa, [place], MD  JARDIANCE 25 MG TABS tablet Take 25 mg by mouth daily. 01/20/24   [provider]  lisinopril  (ZESTRIL ) 5 MG tablet Take 1 tablet (5 mg total) by mouth daily. Patient not taking: Reported on 05/03/2023 08/21/19 07/02/22  Alvan Dorn FALCON, MD  MAGNESIUM OXIDE PO Take 1 tablet by mouth daily.    [provider]  metoprolol  tartrate (LOPRESSOR ) 25 MG tablet Take 1 tablet (25 mg total) by mouth in the morning. 02/04/21   Richarda Prentice LITTIE Mickey., NP  metoprolol  tartrate (LOPRESSOR ) 50 MG tablet Take 1 tablet (50 mg total) by mouth at bedtime. 02/04/21   Richarda Prentice LITTIE Mickey., NP  metroNIDAZOLE  (FLAGYL ) 500 MG tablet Take 1 tablet (500 mg total) by mouth 2 (two) times daily. 11/05/22   Stuart Vernell Norris, PA-C  metroNIDAZOLE  (METROGEL ) 1 % gel Apply 1 application topically at bedtime.     [provider]  montelukast  (SINGULAIR ) 10 MG tablet Take 10 mg by mouth every morning.    [provider]  Multiple Vitamins-Minerals (MULTIVITAMIN WITH MINERALS) tablet Take 1 tablet by mouth daily.    [provider]  nitroGLYCERIN  (NITROSTAT ) 0.4 MG SL tablet Place 1 tablet (0.4 mg total) under the tongue every 5 (five) minutes as  needed for chest pain. 02/04/21   Richarda Prentice LITTIE Mickey., NP  nystatin cream (MYCOSTATIN) Apply 1 application topically daily as needed for dry skin.    [provider]  Olopatadine HCl (PAZEO OP) Place 1 drop into both eyes daily.    [provider]  omega-3 acid ethyl esters (LOVAZA) 1 g capsule Take 1 g by mouth 2 (two) times daily.    [provider]  omeprazole  (PRILOSEC) 40 MG capsule Take 1 capsule (40 mg total) by mouth 2 (two) times daily before a meal. 03/31/21   Rudy Josette RAMAN, PA-C  ondansetron  (ZOFRAN ) 4 MG tablet Take 1 tablet (4 mg total) by mouth every 8 (eight) hours as needed for nausea or vomiting. Patient taking differently: Take 4 mg by mouth as needed for nausea or vomiting. 12/13/20   Rudy,  Josette RAMAN, PA-C  ondansetron  (ZOFRAN ) 8 MG tablet Take 1 tablet (8 mg total) by mouth every 8 (eight) hours as needed for nausea or vomiting. 03/31/21   Rudy Josette RAMAN, PA-C  phentermine  15 MG capsule Take 1 capsule (15 mg total) by mouth every morning. 12/09/23   Thapa, [place], MD  pravastatin  (PRAVACHOL ) 10 MG tablet Take 1 tablet (10 mg total) by mouth daily. 06/10/23   Thapa, [place], MD  promethazine -dextromethorphan (PROMETHAZINE -DM) 6.25-15 MG/5ML syrup Take 5 mLs by mouth 4 (four) times daily as needed. 11/05/22   Stuart Vernell Norris, PA-C  Red Yeast Rice Extract (RED YEAST RICE PO) Take 1 tablet by mouth daily.     [provider]  Respiratory Therapy Supplies (CARETOUCH CPAP & BIPAP HOSE) MISC by Does not apply route. CPAP Titration @ 16 for pressure.    [provider]  Riboflavin (VITAMIN B-2 PO) Take 1 tablet by mouth daily.     [provider]  rizatriptan  (MAXALT ) 10 MG tablet Take 1 tablet (10 mg total) by mouth 3 (three) times daily as needed for migraine. Patient taking differently: Take 10 mg by mouth as needed for migraine. 09/11/20   Gayland Lauraine PARAS, NP  SYNTHROID  150 MCG tablet Take 150 mcg by mouth daily before breakfast.  01/26/24   [provider]  traMADol (ULTRAM) 50 MG tablet Take 50 mg by mouth every 8 (eight) hours as needed for moderate pain (pain score 4-6). 02/02/24   [provider]  traZODone (DESYREL) 100 MG tablet Take 100 mg by mouth at bedtime as needed for sleep.    [provider]  Tretinoin, Facial Wrinkles, (RENOVA) 0.02 % CREA Apply 1 application topically at bedtime.    [provider]  triamcinolone  cream (KENALOG ) 0.1 % Apply 1 application topically 2 (two) times daily as needed. Patient taking differently: Apply 1 application  topically 2 (two) times daily as needed (rash). 03/28/19   Comer Kirsch, PA-C  triamterene -hydrochlorothiazide  (MAXZIDE-25) 37.5-25 MG tablet Take 1 tablet by mouth daily. 02/04/21   Richarda Prentice LITTIE Mickey., NP  zonisamide  (ZONEGRAN ) 100 MG capsule Take 2 capsules (200 mg total) by mouth daily. 11/10/22   Skeet Juliene SAUNDERS, DO    Allergies: Crestor [rosuvastatin], Lipitor [atorvastatin ], Percocet [oxycodone-acetaminophen ], Statins, and Zocor [simvastatin]    Review of Systems  Constitutional:  Negative for appetite change, chills and fever.  Eyes:  Negative for visual disturbance.  Respiratory:  Negative for shortness of breath.   Cardiovascular:  Positive for chest pain (Right rib pain).  Gastrointestinal:  Negative for abdominal pain, nausea and vomiting.  Genitourinary:  Negative for dysuria and flank pain.  Musculoskeletal:  Positive for arthralgias (Right hip right shoulder pain) and neck pain. Negative for back pain.  Skin:  Negative for wound.  Neurological:  Positive for dizziness, syncope and headaches. Negative for seizures, weakness and numbness.  Psychiatric/Behavioral:  The patient is nervous/anxious.     Updated Vital Signs BP (!) 146/88 (BP Location: Right Arm)   Pulse 82   Temp 98.6 F (37 C) (Oral)   Resp 20   Ht 5' 3 (1.6 m)   Wt 97.5 kg   SpO2 100%   BMI 38.09 kg/m   Physical Exam Vitals and nursing note  reviewed.  Constitutional:      General: She is not in acute distress.    Appearance: Normal appearance. She is not toxic-appearing.     Comments: Patient is anxious appearing, tearful  HENT:  Head: Atraumatic.     Comments: No palpable scalp hematomas or abrasions    Right Ear: Tympanic membrane and ear canal normal.     Left Ear: Tympanic membrane and ear canal normal.     Mouth/Throat:     Mouth: Mucous membranes are moist.     Pharynx: Oropharynx is clear.  Eyes:     Extraocular Movements: Extraocular movements intact.     Conjunctiva/sclera: Conjunctivae normal.     Pupils: Pupils are equal, round, and reactive to light.  Neck:     Comments: Tenderness right cervical paraspinal muscles and lower midline tenderness.  No bony step-off. Cardiovascular:     Rate and Rhythm: Normal rate and regular rhythm.     Pulses: Normal pulses.  Pulmonary:     Effort: Pulmonary effort is normal.  Chest:     Chest wall: Tenderness (Mild tenderness to the lateral right chest wall.  No soft tissue crepitus or guarding on exam) present.  Abdominal:     Palpations: Abdomen is soft.     Tenderness: There is no abdominal tenderness.  Musculoskeletal:        General: Tenderness (Tenderness lateral right hip.  Patient able to perform full range of motion of the hip, flexors and extensors are intact) and signs of injury present.     Cervical back: Tenderness present.  Skin:    General: Skin is warm.     Capillary Refill: Capillary refill takes less than 2 seconds.  Neurological:     General: No focal deficit present.     Mental Status: She is alert and oriented to person, place, and time.     GCS: GCS eye subscore is 4. GCS verbal subscore is 5. GCS motor subscore is 6.     Cranial Nerves: Cranial nerves 2-12 are intact.     Sensory: Sensation is intact. No sensory deficit.     Motor: Motor function is intact. No weakness or pronator drift.     Coordination: Coordination is intact.  Coordination normal.     Comments: CN II through XII intact.  Speech clear.  No facial droop.  Mentating well.  Moves all extremities well.     (all labs ordered are listed, but only abnormal results are displayed) Labs Reviewed  COMPREHENSIVE METABOLIC PANEL WITH GFR - Abnormal; Notable for the following components:      Result Value   CO2 21 (*)    Glucose, Bld 199 (*)    Creatinine, Ser 1.06 (*)    All other components within normal limits  URINALYSIS, ROUTINE W REFLEX MICROSCOPIC - Abnormal; Notable for the following components:   Glucose, UA >=500 (*)    Bacteria, UA RARE (*)    All other components within normal limits  CBG MONITORING, ED - Abnormal; Notable for the following components:   Glucose-Capillary 196 (*)    All other components within normal limits  CBC WITH DIFFERENTIAL/PLATELET    EKG: EKG Interpretation Date/Time:  Thursday March 16 2024 13:05:01 EDT Ventricular Rate:  77 PR Interval:  160 QRS Duration:  92 QT Interval:  412 QTC Calculation: 466 R Axis:   63  Text Interpretation: Normal sinus rhythm Normal ECG When compared with ECG of 10-Jun-2021 13:28, No significant change was found Confirmed by Cleotilde Rogue ([id number]) on 03/16/2024 3:05:48 PM  Radiology: ARCOLA Shoulder Right Result Date: 03/16/2024 CLINICAL DATA:  Fall. EXAM: RIGHT SHOULDER - 2+ VIEW COMPARISON:  None Available. FINDINGS: There is no evidence of fracture or dislocation.  There is no evidence of arthropathy or other focal bone abnormality. Soft tissues are unremarkable. IMPRESSION: Negative. Electronically Signed   By: Lynwood Landy Raddle M.D.   On: 03/16/2024 19:28   MR BRAIN WO CONTRAST Result Date: 03/16/2024 CLINICAL DATA:  Head trauma, abnormal mental status. EXAM: MRI HEAD WITHOUT CONTRAST TECHNIQUE: Multiplanar, multiecho pulse sequences of the brain and surrounding structures were obtained without intravenous contrast. COMPARISON:  MRI head 03/13/2021. FINDINGS: Brain: No acute infarct. No  evidence of intracranial hemorrhage. Scattered T2/FLAIR hyperintensity in the periventricular and subcortical white matter overall similar to prior. 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 No edema, mass effect, or midline shift. Posterior fossa is unremarkable. Normal appearance of midline structures. The basilar cisterns are patent. No extra-axial fluid collections. Ventricles: Normal size and configuration of the ventricles. Vascular: Skull base flow voids are visualized. Skull and upper cervical spine: No focal abnormality. Sinuses/Orbits: Orbits are symmetric. Paranasal sinuses are clear. Other: Mastoid air cells are clear. IMPRESSION: No acute intracranial abnormality. Similar appearance of white matter signal abnormality compatible with reported history of multiple sclerosis. 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 Electronically Signed   By: Donnice Mania M.D.   On: 03/16/2024 19:28   DG Hip Unilat W or Wo Pelvis 2-3 Views Right Result Date: 03/16/2024 CLINICAL DATA:  Fall. EXAM: DG HIP (WITH OR WITHOUT PELVIS) 2-3V RIGHT COMPARISON:  None Available. FINDINGS: There is no evidence of hip fracture or dislocation. There is no evidence of arthropathy or other focal bone abnormality. IMPRESSION: Negative. Electronically Signed   By: Lynwood Landy Raddle M.D.   On: 03/16/2024 19:26   DG Ribs Unilateral W/Chest Right Result Date: 03/16/2024 CLINICAL DATA:  Dizziness, fall. EXAM: RIGHT RIBS AND CHEST - 3+ VIEW COMPARISON:  September 30, 2021. FINDINGS: No fracture or other bone lesions are seen involving the ribs. There is no evidence of pneumothorax or pleural effusion. Both lungs are clear. Heart size and mediastinal contours are within normal limits. IMPRESSION: Negative.  Electronically Signed   By: Lynwood Landy Raddle M.D.   On: 03/16/2024 19:25   CT Cervical Spine Wo Contrast Result Date: 03/16/2024 CLINICAL DATA:  Neck trauma dizzy history of fall EXAM: CT CERVICAL SPINE WITHOUT CONTRAST TECHNIQUE: Multidetector CT imaging of the cervical spine was performed without intravenous contrast. Multiplanar CT image reconstructions were also generated. RADIATION DOSE REDUCTION: This exam was performed according to the departmental dose-optimization program which includes automated exposure control, adjustment of the mA and/or kV according to patient size and/or use of iterative reconstruction technique. COMPARISON:  MRI 02/11/2021, CT 02/11/2020 FINDINGS: Alignment: Straightening of the cervical spine. No subluxation. Facet alignment is normal Skull base and vertebrae: No acute fracture. No primary bone lesion or focal pathologic process. Soft tissues and spinal canal: No prevertebral fluid or swelling. No visible canal hematoma. Disc levels: Moderate severe disc space narrowing C5-C6 and C6-C7. Posterior disc osteophyte complex at C5 C6 and C6-C7 results in moderate canal stenosis. Mild bilateral foraminal narrowing at C6-C7. Upper chest: Negative. Other: None IMPRESSION: Straightening of the cervical spine with degenerative changes at C5-C6 and C6-C7. No acute osseous abnormality. Electronically Signed   By: Luke Bun M.D.   On: 03/16/2024 19:10     Procedures   Medications Ordered in the ED  ondansetron  (ZOFRAN ) injection 4 mg (0 mg  Intravenous Hold 03/16/24 1819)  meclizine  (ANTIVERT ) tablet 25 mg (has no administration in time range)  acetaminophen  (TYLENOL ) tablet 1,000 mg (has no administration in time range)                                    Medical Decision Making Patient here for evaluation of dizziness and brief syncopal episode that occurred earlier today.  She has had dizziness for the last 3 days that she states have been intermittent.  No history of  seizures.  Describes dizziness as sensation of movement even while lying supine.  No fever chills nausea or vomiting.  Multiple arthralgias secondary to fall.  Right sided headache that began after the fall.    Differential would include but not limited to vertigo, viral process, stroke brain bleed also considered but felt less likely  Amount and/or Complexity of Data Reviewed Labs: ordered.    Details: Labs interpreted by me, no evidence of leukocytosis, chemistries without significant derangement.  Urinalysis without evidence of infection Radiology: ordered.    Details: Plain film of the hip right chest and right shoulder without acute bony abnormality.  CT C-spine shows straightening of the cervical spine with degenerative changes at C5 and 6 and 6 and 7 but no acute osseous abnormality.  MRI brain without acute intracranial abnormality ECG/medicine tests: ordered.    Details: EKG shows normal sinus rhythm no significant change from previous Discussion of management or test interpretation with external provider(s): Patient resting comfortably.  Workup today reassuring.  Pt ambulatory with steady gait. Treated here with meclizine  will provide prescription for same.  She has follow-up with her MS provider next month.  I have also recommended close outpatient follow-up with her PCP as well.  Risk OTC drugs. Prescription drug management.        Final diagnoses:  Dizziness  Syncope and collapse    ED Discharge Orders     None          Herlinda Milling, PA-C 03/17/24 1356    Cleotilde Rogue, MD 03/17/24 1601

## 2024-03-16 NOTE — ED Triage Notes (Signed)
 Pt does report her head hurts as well.

## 2024-03-16 NOTE — Discharge Instructions (Signed)
 Your workup here today was reassuring.  Please keep your upcoming appointment with your MS provider.  Also recommend that you contact your PCP and arrange close follow-up for next week.  You have been given medication to help with your dizziness, this may cause drowsiness so do not operate machinery or drive while taking the medication.

## 2024-03-27 ENCOUNTER — Ambulatory Visit: Admitting: Primary Care

## 2024-04-05 ENCOUNTER — Other Ambulatory Visit (HOSPITAL_COMMUNITY)
Admission: RE | Admit: 2024-04-05 | Discharge: 2024-04-05 | Disposition: A | Discharge status: Home / Self Care | Source: Ambulatory Visit | Attending: Endocrinology | Admitting: Endocrinology

## 2024-04-05 DIAGNOSIS — E063 Autoimmune thyroiditis: Secondary | ICD-10-CM | POA: Insufficient documentation

## 2024-04-05 DIAGNOSIS — E782 Mixed hyperlipidemia: Secondary | ICD-10-CM | POA: Diagnosis present

## 2024-04-05 LAB — LIPID PANEL
Cholesterol: 220 mg/dL — ABNORMAL HIGH (ref 0–200)
HDL: 46 mg/dL (ref >40)
LDL Cholesterol: 150 mg/dL — ABNORMAL HIGH (ref 0–99)
Total CHOL/HDL Ratio: 4.8 ratio
Triglycerides: 118 mg/dL (ref <150)
VLDL: 24 mg/dL (ref 0–40)

## 2024-04-05 LAB — T4, FREE: Free T4: 0.92 ng/dL (ref 0.61–1.12)

## 2024-04-05 LAB — TSH: TSH: 0.556 u[IU]/mL (ref 0.350–4.500)

## 2024-04-06 ENCOUNTER — Encounter: Payer: Self-pay | Admitting: Endocrinology

## 2024-04-06 ENCOUNTER — Ambulatory Visit: Payer: Self-pay | Admitting: Endocrinology

## 2024-04-06 ENCOUNTER — Ambulatory Visit: Admitting: Endocrinology

## 2024-04-06 VITALS — BP 130/70 | HR 68 | Resp 20 | Ht 63.0 in | Wt 212.4 lb

## 2024-04-06 DIAGNOSIS — E063 Autoimmune thyroiditis: Secondary | ICD-10-CM

## 2024-04-06 DIAGNOSIS — E1165 Type 2 diabetes mellitus with hyperglycemia: Secondary | ICD-10-CM

## 2024-04-06 DIAGNOSIS — E669 Obesity, unspecified: Secondary | ICD-10-CM | POA: Diagnosis not present

## 2024-04-06 DIAGNOSIS — Z794 Long term (current) use of insulin: Secondary | ICD-10-CM | POA: Diagnosis not present

## 2024-04-06 DIAGNOSIS — E782 Mixed hyperlipidemia: Secondary | ICD-10-CM | POA: Diagnosis not present

## 2024-04-06 LAB — POCT GLYCOSYLATED HEMOGLOBIN (HGB A1C): Hemoglobin A1C: 6.9 % — AB (ref 4.0–5.6)

## 2024-04-06 MED ORDER — JARDIANCE 25 MG PO TABS
25.0000 mg | ORAL_TABLET | Freq: Every day | ORAL | 3 refills | Status: AC
  Filled 2024-04-21: qty 90, 90d supply, fill #0
  Filled 2024-08-05: qty 90, 90d supply, fill #1

## 2024-04-06 MED ORDER — PRAVASTATIN SODIUM 10 MG PO TABS
10.0000 mg | ORAL_TABLET | Freq: Every day | ORAL | 3 refills | Status: AC
  Filled 2024-09-07: qty 90, 90d supply, fill #0

## 2024-04-06 MED ORDER — PEN NEEDLES 31G X 6 MM MISC: 1.0000 | Freq: Every day | 3 refills | Status: AC

## 2024-04-06 MED ORDER — LEVOTHYROXINE SODIUM 112 MCG PO TABS: 224.0000 ug | ORAL_TABLET | Freq: Every day | ORAL | 3 refills | Status: AC

## 2024-04-06 MED ORDER — OMEGA-3-ACID ETHYL ESTERS 1 G PO CAPS: 1.0000 g | ORAL_CAPSULE | Freq: Two times a day (BID) | ORAL | 3 refills | Status: DC

## 2024-04-06 NOTE — Progress Notes (Signed)
 Outpatient Endocrinology Note Amy Ilija Maxim, MD  04/06/24  Patient's Name: Amy Lester    DOB: [birth year]    MRN: [id number]                                                    REASON OF VISIT: Follow up for type 2 diabetes mellitus /hypothyroidism  PCP: Mercer Clotilda SAUNDERS, MD  HISTORY OF PRESENT ILLNESS:   Amy Lester is a 53 y.o. old female with past medical history listed below, is here for follow up of type 2 diabetes mellitus / hypothyroidism and obesity.    Pertinent Diabetes History: Patient was diagnosed with type 2 diabetes mellitus in 2018.  She had prediabetes prior to diagnosis of diabetes mellitus.  Patient has uncontrolled type 2 diabetes mellitus.    Chronic Diabetes Complications : Retinopathy: no. Last ophthalmology exam was done on annually, reportedly. Nephropathy: no, on lisinopril . Peripheral neuropathy: no Coronary artery disease: no Stroke: no  Relevant comorbidities and cardiovascular risk factors: Obesity: yes Body mass index is 37.62 kg/m.  Hypertension: yes Hyperlipidemia. Yes.  Side effects from different statin, Lipitor caused upper body muscle ache, does not want to take it.  Currently on Zetia  /fish oil.  Per chart review she had tried simvastatin in the past had muscle ache and is stopped.  Patient reports she also had side effect from rosuvastatin/Crestor in the past.  Repatha  denied by medical insurance and she does not like to take anyway.  Current / Home Diabetic regimen includes: Tresiba  32 units in the morning and 32 units at bedtime. NovoLog  10 units with meals 3 times a day. Glimepiride  4 mg daily. Jardiance  25 mg daily.  Prior diabetic medications: Metformin stopped due to GI intolerance/abdominal discomfort. Ozempic  stopped by patient due to concern with side effects and lawsuits. Glipizide  in the past.  Glycemic data:  No glucometer data to review today.  Hypoglycemia: Patient has no hypoglycemic episodes. Patient has  hypoglycemia awareness.  Factors modifying glucose control: 1.  Diabetic diet assessment: 3 meals a day.  2.  Staying active or exercising: No formal exercise  3.  Medication compliance: compliant most of the time.  # Hypothyroidism -Strong family history of hypothyroidism.  She was diagnosed with hypothyroidism 25+ years ago.  She has mostly been on levothyroxine /Synthroid .  She had tried levothyroxine /Cytomel  in the past.  Levothyroxine  dose was adjusted periodically in the past.  She has been lately taking 224 mcg daily.  Interval history  Hemoglobin A1c improved to 6.9% today, congratulated her.  Reviewed and noted diabetes regimen as above.  She reports she has also been taking Jardiance  25 mg daily.  She has been taking levothyroxine /Synthroid  112 mcg 2 tablets daily.  Denies palpitation and heat intolerance.  Recent thyroid  function test normal as follows.  Recent lab results with cholesterol is still high LDL 150.  She has not restarted pravastatin  as prescribed and talked in the last visit.  No other complaints today.   Latest Reference Range & Units 04/05/24 08:06  TSH 0.350 - 4.500 uIU/mL 0.556  T4,Free(Direct) 0.61 - 1.12 ng/dL 9.07    REVIEW OF SYSTEMS As per history of present illness.   PAST MEDICAL HISTORY: Past Medical History:  Diagnosis Date   Asthma    Blood transfusion without reported diagnosis    Chronic migraine  Common migraine with intractable migraine 02/16/2019   Diabetes mellitus without complication (HCC)    Endometriosis    Fatty liver    GERD (gastroesophageal reflux disease)    Hyperlipidemia    Hypertension    Hypothyroidism    Incontinence    Irregular heartbeat    Multiple sclerosis (HCC)    Palpitations    Postconcussive syndrome 05/09/2020   Pre-diabetes    Sleep apnea     PAST SURGICAL HISTORY: Past Surgical History:  Procedure Laterality Date   ABDOMINAL HYSTERECTOMY     CESAREAN SECTION     x1   CHOLECYSTECTOMY      COLONOSCOPY WITH PROPOFOL  N/A 11/04/2020   Surgeon: Shaaron Lamar HERO, MD; normal exam.  Repeat in 10 years.   ENDOMETRIAL ABLATION     ESOPHAGOGASTRODUODENOSCOPY (EGD) WITH PROPOFOL  N/A 11/04/2020   Surgeon: Shaaron Lamar HERO, MD; normal exam.   FOOT SURGERY Left 06/2018   foot reconstruction   REPLACEMENT TOTAL KNEE Left     ALLERGIES: Allergies  Allergen Reactions   Crestor [Rosuvastatin] Other (See Comments)    Myalgia    Lipitor [Atorvastatin ] Other (See Comments)    Myalgia    Percocet [Oxycodone-Acetaminophen ] Nausea And Vomiting   Statins Other (See Comments)    Leg pain   Zocor [Simvastatin] Other (See Comments)    Myalgia     FAMILY HISTORY:  Family History  Problem Relation Age of Onset   Heart attack Mother    Stroke Mother    Cancer Mother        breast cancer   Hyperlipidemia Mother    Hypothyroidism Mother    Cancer Father        lymph nodes, liver cancer   Colon cancer Father        early 59s   Other Daughter        Fine Gold Type II Syndrome and cognetive issues   Memory loss Daughter    Other Son        Fine Gold Syndrome and BPES   Diabetes Son 22       IDDM   Hypothyroidism Sister    Cancer Sister    Hyperthyroidism Neg Hx     SOCIAL HISTORY: Social History   Socioeconomic History   Marital status: Married    Spouse name: Not on file   Number of children: Not on file   Years of education: 14   Highest education level: Not on file  Occupational History   Not on file  Tobacco Use   Smoking status: Never    Passive exposure: Past   Smokeless tobacco: Never  Vaping Use   Vaping status: Never Used  Substance and Sexual Activity   Alcohol use: Yes    Comment: once a year   Drug use: Never   Sexual activity: Not Currently  Other Topics Concern   Not on file  Social History Narrative   Left handed    Caffeine 1 daily    Lives at home with husband and adult daughter   One story home   Social Drivers of Health   Financial Resource  Strain: Not on file  Food Insecurity: Not on file  Transportation Needs: No Transportation Needs (11/29/2023)   Received from [facility]   PRAPARE - Transportation    Lack of Transportation (Medical): No    Lack of Transportation (Non-Medical): No  Physical Activity: Not on file  Stress: Not on file  Social Connections: Not on file  MEDICATIONS:  Current Outpatient Medications  Medication Sig Dispense Refill   acetaminophen  (TYLENOL ) 500 MG tablet Take 500 mg by mouth as needed for moderate pain.     albuterol  (PROVENTIL ) (2.5 MG/3ML) 0.083% nebulizer solution Take 3 mLs (2.5 mg total) by nebulization every 6 (six) hours as needed for wheezing or shortness of breath. 75 mL 3   albuterol  (VENTOLIN  HFA) 108 (90 Base) MCG/ACT inhaler Inhale 1-2 puffs into the lungs every 6 (six) hours as needed for wheezing or shortness of breath. 18 g 0   aspirin  EC 81 MG tablet Take 1 tablet (81 mg total) by mouth daily. Swallow whole.     calcium  carbonate (TUMS - DOSED IN MG ELEMENTAL CALCIUM ) 500 MG chewable tablet Chew 2-3 tablets by mouth daily as needed for indigestion or heartburn.     cetirizine  (ZYRTEC ) 10 MG tablet Take 1 tablet (10 mg total) by mouth daily. 90 tablet 0   ciprofloxacin (CILOXAN) 0.3 % ophthalmic solution Place into both eyes daily.     clotrimazole -betamethasone  (LOTRISONE ) cream Apply 1 application topically as needed. 45 g 2   Cyanocobalamin  (B-12 COMPLIANCE INJECTION IJ) Inject 1 Dose as directed every 30 (thirty) days.     escitalopram  (LEXAPRO ) 20 MG tablet Take 1 tablet (20 mg total) by mouth daily. 30 tablet 3   ezetimibe  (ZETIA ) 10 MG tablet Take 1 tablet (10 mg total) by mouth daily. 90 tablet 3   fluconazole  (DIFLUCAN ) 150 MG tablet Take one tab now.  Repeat dose in 3 days if needed. 2 tablet 0   Fremanezumab -vfrm (AJOVY ) 225 MG/1.5ML SOAJ Inject 225 mg into the skin every 28 (twenty-eight) days. 1.68 mL 11   glimepiride  (AMARYL ) 4 MG tablet Take 1 tablet (4 mg  total) by mouth daily before breakfast. 90 tablet 3   HUMULIN R  U-500 KWIKPEN 500 UNIT/ML KwikPen Inject 15 Units into the skin 3 (three) times daily with meals.     ibuprofen (ADVIL) 200 MG tablet Take 600-800 mg by mouth as needed for mild pain or moderate pain.     insulin  aspart (NOVOLOG  FLEXPEN) 100 UNIT/ML FlexPen Inject 12 Units into the skin 3 (three) times daily with meals. 30 mL 4   insulin  degludec (TRESIBA  FLEXTOUCH) 200 UNIT/ML FlexTouch Pen Inject 32 Units into the skin in the morning and at bedtime. 30 mL 3   Insulin  Pen Needle (PEN NEEDLES) 31G X 6 MM MISC 1 each by Does not apply route 5 (five) times daily. 300 each 3   Insulin  Pen Needle 32G X 4 MM MISC Use 5x a day 300 each 3   Insulin  Pen Needle 32G X 4 MM MISC Use 4x a day 300 each 3   levothyroxine  (SYNTHROID ) 112 MCG tablet Take 2 tablets (224 mcg total) by mouth daily. 180 tablet 3   lisinopril  (ZESTRIL ) 5 MG tablet Take 1 tablet (5 mg total) by mouth daily. 90 tablet 1   MAGNESIUM OXIDE PO Take 1 tablet by mouth daily.     meclizine  (ANTIVERT ) 25 MG tablet Take 1 tablet (25 mg total) by mouth 3 (three) times daily as needed for dizziness. May cause drowsiness 21 tablet 0   metoprolol  tartrate (LOPRESSOR ) 25 MG tablet Take 1 tablet (25 mg total) by mouth in the morning. 90 tablet 3   metoprolol  tartrate (LOPRESSOR ) 50 MG tablet Take 1 tablet (50 mg total) by mouth at bedtime. 90 tablet 3   metroNIDAZOLE  (FLAGYL ) 500 MG tablet Take 1 tablet (500 mg total) by  mouth 2 (two) times daily. 14 tablet 0   metroNIDAZOLE  (METROGEL ) 1 % gel Apply 1 application topically at bedtime.      montelukast  (SINGULAIR ) 10 MG tablet Take 10 mg by mouth every morning.     Multiple Vitamins-Minerals (MULTIVITAMIN WITH MINERALS) tablet Take 1 tablet by mouth daily.     nitroGLYCERIN  (NITROSTAT ) 0.4 MG SL tablet Place 1 tablet (0.4 mg total) under the tongue every 5 (five) minutes as needed for chest pain. 25 tablet 3   nystatin cream (MYCOSTATIN)  Apply 1 application topically daily as needed for dry skin.     Olopatadine HCl (PAZEO OP) Place 1 drop into both eyes daily.     omeprazole  (PRILOSEC) 40 MG capsule Take 1 capsule (40 mg total) by mouth 2 (two) times daily before a meal. 60 capsule 3   ondansetron  (ZOFRAN ) 4 MG tablet Take 1 tablet (4 mg total) by mouth every 8 (eight) hours as needed for nausea or vomiting. 20 tablet 1   ondansetron  (ZOFRAN ) 8 MG tablet Take 1 tablet (8 mg total) by mouth every 8 (eight) hours as needed for nausea or vomiting. 20 tablet 1   phentermine  15 MG capsule Take 1 capsule (15 mg total) by mouth every morning. 30 capsule 2   promethazine -dextromethorphan (PROMETHAZINE -DM) 6.25-15 MG/5ML syrup Take 5 mLs by mouth 4 (four) times daily as needed. 100 mL 0   Red Yeast Rice Extract (RED YEAST RICE PO) Take 1 tablet by mouth daily.      Respiratory Therapy Supplies (CARETOUCH CPAP & BIPAP HOSE) MISC by Does not apply route. CPAP Titration @ 16 for pressure.     Riboflavin (VITAMIN B-2 PO) Take 1 tablet by mouth daily.      rizatriptan  (MAXALT ) 10 MG tablet Take 1 tablet (10 mg total) by mouth 3 (three) times daily as needed for migraine. 10 tablet 5   traMADol (ULTRAM) 50 MG tablet Take 50 mg by mouth every 8 (eight) hours as needed for moderate pain (pain score 4-6).     traZODone (DESYREL) 100 MG tablet Take 100 mg by mouth at bedtime as needed for sleep.     Tretinoin, Facial Wrinkles, (RENOVA) 0.02 % CREA Apply 1 application topically at bedtime.     triamcinolone  cream (KENALOG ) 0.1 % Apply 1 application topically 2 (two) times daily as needed. 45 g 0   triamterene -hydrochlorothiazide  (MAXZIDE-25) 37.5-25 MG tablet Take 1 tablet by mouth daily. 90 tablet 3   zonisamide  (ZONEGRAN ) 100 MG capsule Take 2 capsules (200 mg total) by mouth daily. 60 capsule 5   Continuous Glucose Receiver (FREESTYLE LIBRE 3 READER) DEVI Use to monitor blood sugar (Patient not taking: Reported on 04/06/2024) 1 each 0   Continuous  Glucose Sensor (FREESTYLE LIBRE 3 SENSOR) MISC 1 Device by Does not apply route every 14 (fourteen) days. Apply 1 sensor on upper arm every 14 days for continuous glucose monitoring (Patient not taking: Reported on 04/06/2024) 2 each 2   eszopiclone  (LUNESTA ) 1 MG TABS tablet Take 1 tablet (1 mg total) by mouth at bedtime as needed for sleep. Take immediately before bedtime 30 tablet 0   Evolocumab  (REPATHA ) 140 MG/ML SOSY Inject contents of pen twice a month in abdominal skin (Patient not taking: Reported on 04/06/2024) 2 mL 5   JARDIANCE  25 MG TABS tablet Take 1 tablet (25 mg total) by mouth daily. 90 tablet 3   omega-3 acid ethyl esters (LOVAZA ) 1 g capsule Take 1 capsule (1 g total) by  mouth 2 (two) times daily. 90 capsule 3   pravastatin  (PRAVACHOL ) 10 MG tablet Take 1 tablet (10 mg total) by mouth daily. 90 tablet 3   No current facility-administered medications for this visit.    PHYSICAL EXAM: Vitals:   04/06/24 1322  BP: 130/70  Pulse: 68  Resp: 20  SpO2: 98%  Weight: 212 lb 6.4 oz (96.3 kg)  Height: 5' 3 (1.6 m)       Body mass index is 37.62 kg/m.  Wt Readings from Last 3 Encounters:  04/06/24 212 lb 6.4 oz (96.3 kg)  03/16/24 215 lb (97.5 kg)  01/04/24 211 lb 12.8 oz (96.1 kg)    General: Well developed, well nourished female in no apparent distress.  HEENT: AT/[place], no external lesions.  Eyes: Conjunctiva clear and no icterus. Neck: Neck supple  Lungs: Respirations not labored Neurologic: Alert, oriented, normal speech Extremities / Skin: Dry.  No edema. Psychiatric: Does not appear depressed or anxious  Diabetic Foot Exam - Simple   No data filed    LABS Reviewed Lab Results  Component Value Date   HGBA1C 6.9 (A) 04/06/2024   HGBA1C 7.8 (A) 12/09/2023   HGBA1C 8.1 (H) 09/17/2023   No results found for: FRUCTOSAMINE Lab Results  Component Value Date   CHOL 220 (H) 04/05/2024   HDL 46 04/05/2024   LDLCALC 150 (H) 04/05/2024   LDLDIRECT 172.0  04/08/2023   TRIG 118 04/05/2024   CHOLHDL 4.8 04/05/2024   Lab Results  Component Value Date   MICRALBCREAT 7.0 04/07/2023   Lab Results  Component Value Date   CREATININE 1.06 (H) 03/16/2024   Lab Results  Component Value Date   GFR 98.37 12/09/2023    ASSESSMENT / PLAN  1. Uncontrolled type 2 diabetes mellitus with hyperglycemia, with long-term current use of insulin  (HCC)   2. Mixed hyperlipidemia   3. Acquired autoimmune hypothyroidism   4. Obesity (BMI 30-39.9)    Diabetes Mellitus type 2, complicated by no other known complications. - Diabetic status / severity: Uncontrolled, improving.  Lab Results  Component Value Date   HGBA1C 6.9 (A) 04/06/2024   Discussed about type 2 diabetes mellitus and importance of better control to avoid chronic diabetic complications.  Patient does not want to go back on Ozempic  or GLP-1 receptor agonist due to potential side effects.  She has complaints of occasional vaginal candidiasis, I would like to avoid SGLT2 inhibitor at this time.  Diabetes control has improved with hemoglobin A1c of 6.9%.  - Medications: See below  I) continue Tresiba  from 32 units 2 times a day. II) continue glimepiride  4 mg daily. III) increase NovoLog  10 units with meals 3 times a day. IV) continue Jardiance  25 mg daily.  - Home glucose testing: 3-4 times a day before meals and at bedtime.  Discussed about continuous glucose monitoring, will consider in the future.  Asked to bring glucometer in the follow-up visit.  - Discussed/ Gave Hypoglycemia treatment plan.  # Consult : not required at this time.  # Annual urine for microalbuminuria/ creatinine ratio, no microalbuminuria currently.  Will check urine microalbumin creatinine ratio today. Last  Lab Results  Component Value Date   MICRALBCREAT 7.0 04/07/2023    # Foot check nightly.  # Annual dilated diabetic eye exams.   - Diet: Make healthy diabetic food choices - Life style / activity /  exercise: Discussed.  2. Blood pressure  -  BP Readings from Last 1 Encounters:  04/06/24 130/70    -  Control is in target.  - No change in current plans.  3. Lipid status / Hyperlipidemia - Last  Lab Results  Component Value Date   LDLCALC 150 (H) 04/05/2024   -Patient reports had side effect mainly muscle ache with atorvastatin , simvastatin and ? rosuvastatin.  Repatha  was prescribed in the last visit however not covered by insurance.  She does not want to take Repatha  anyway.  She want to try statin therapy. - Start pravastatin  10 mg daily.  She has not started yet as planned in the last visit.  # Primary hypothyroidism : -Currently taking levothyroxine  /Synthroid  112 mcg 2 tablets daily.   She reports that when she was on Synthroid  used to have regulated and normal thyroid  function test and thyroid  hormone levels.  With the generic levothyroxine  she has not been feeling good. -Continue current dose of Synthroid  224 mcg daily.  # Obesity, BMI 36 -Patient was referred to weight management clinic, patient reports not able to be seen due to insurance not covering it.  She wants to try phentermine . -Discussed in detail about lifestyle modification with diet control and exercise. - Planned in last visit short-term trial of phentermine , phentermine  15 mg daily.  Discussed about potential side effects and asked to call our clinic if it develops.   Diagnoses and all orders for this visit:  Uncontrolled type 2 diabetes mellitus with hyperglycemia, with long-term current use of insulin  (HCC) -     POCT glycosylated hemoglobin (Hb A1C) -     Microalbumin / creatinine urine ratio -     Insulin  Pen Needle (PEN NEEDLES) 31G X 6 MM MISC; 1 each by Does not apply route 5 (five) times daily. -     JARDIANCE  25 MG TABS tablet; Take 1 tablet (25 mg total) by mouth daily.  Mixed hyperlipidemia -     pravastatin  (PRAVACHOL ) 10 MG tablet; Take 1 tablet (10 mg total) by mouth daily. -     omega-3  acid ethyl esters (LOVAZA ) 1 g capsule; Take 1 capsule (1 g total) by mouth 2 (two) times daily.  Acquired autoimmune hypothyroidism -     levothyroxine  (SYNTHROID ) 112 MCG tablet; Take 2 tablets (224 mcg total) by mouth daily.  Obesity (BMI 30-39.9)    DISPOSITION Follow up in clinic in 3 months suggested.   All questions answered and patient verbalized understanding of the plan.  Amy Keirston Saephanh, MD [facility] [address] [place], [address] [place], [place] [address] Phone # [phone]  At least part of this note was generated using voice recognition software. Inadvertent word errors may have occurred, which were not recognized during the proofreading process.

## 2024-04-06 NOTE — Patient Instructions (Signed)
 Latest Reference Range & Units 07/09/23 10:30 09/17/23 10:25 12/09/23 09:22 04/06/24 13:25  Hemoglobin A1C 4.0 - 5.6 % 9.8 ! Pend 8.1 (H) 7.8 ! Pend 6.9 !  !: Data is abnormal (H): Data is abnormally high

## 2024-04-07 LAB — MICROALBUMIN / CREATININE URINE RATIO
Creatinine, Urine: 61 mg/dL (ref 20–275)
Microalb Creat Ratio: 3 mg/g{creat} (ref <30)
Microalb, Ur: 0.2 mg/dL

## 2024-04-10 ENCOUNTER — Other Ambulatory Visit: Payer: Self-pay

## 2024-04-10 ENCOUNTER — Other Ambulatory Visit (HOSPITAL_COMMUNITY): Payer: Self-pay

## 2024-04-10 ENCOUNTER — Other Ambulatory Visit (HOSPITAL_BASED_OUTPATIENT_CLINIC_OR_DEPARTMENT_OTHER): Payer: Self-pay

## 2024-04-10 MED ORDER — LEVOTHYROXINE SODIUM 112 MCG PO TABS
224.0000 ug | ORAL_TABLET | Freq: Every day | ORAL | 0 refills | Status: AC
  Filled 2024-04-10: qty 180, 90d supply, fill #0
  Filled 2024-09-07: qty 180, 90d supply, fill #1

## 2024-04-11 ENCOUNTER — Other Ambulatory Visit (HOSPITAL_BASED_OUTPATIENT_CLINIC_OR_DEPARTMENT_OTHER): Payer: Self-pay

## 2024-04-11 ENCOUNTER — Other Ambulatory Visit (HOSPITAL_COMMUNITY): Payer: Self-pay

## 2024-04-19 ENCOUNTER — Other Ambulatory Visit (HOSPITAL_BASED_OUTPATIENT_CLINIC_OR_DEPARTMENT_OTHER): Payer: Self-pay

## 2024-04-19 MED ORDER — TRAZODONE HCL 50 MG PO TABS
25.0000 mg | ORAL_TABLET | Freq: Every evening | ORAL | 0 refills | Status: AC | PRN
  Filled 2024-04-19 (×2): qty 30, 30d supply, fill #0

## 2024-04-19 MED ORDER — DULOXETINE HCL 30 MG PO CPEP
ORAL_CAPSULE | ORAL | 0 refills | Status: AC
  Filled 2024-04-19 (×2): qty 60, 33d supply, fill #0

## 2024-04-21 ENCOUNTER — Other Ambulatory Visit: Payer: Self-pay | Admitting: Family Medicine

## 2024-04-21 ENCOUNTER — Other Ambulatory Visit (HOSPITAL_BASED_OUTPATIENT_CLINIC_OR_DEPARTMENT_OTHER): Payer: Self-pay

## 2024-04-21 ENCOUNTER — Other Ambulatory Visit: Payer: Self-pay | Admitting: Endocrinology

## 2024-04-21 DIAGNOSIS — E782 Mixed hyperlipidemia: Secondary | ICD-10-CM

## 2024-04-21 DIAGNOSIS — E785 Hyperlipidemia, unspecified: Secondary | ICD-10-CM

## 2024-04-21 MED ORDER — INSULIN ASPART 100 UNIT/ML FLEXPEN
10.0000 [IU] | PEN_INJECTOR | Freq: Three times a day (TID) | SUBCUTANEOUS | 11 refills | Status: DC
  Filled 2024-05-03: qty 15, 50d supply, fill #0
  Filled 2024-06-12: qty 15, 50d supply, fill #1
  Filled 2024-07-27: qty 9, 30d supply, fill #2
  Filled 2024-09-05: qty 9, 30d supply, fill #3

## 2024-04-21 MED ORDER — OMEGA-3-ACID ETHYL ESTERS 1 G PO CAPS
1.0000 g | ORAL_CAPSULE | Freq: Two times a day (BID) | ORAL | 3 refills | Status: AC
  Filled 2024-04-21: qty 90, 45d supply, fill #0
  Filled 2024-09-07: qty 180, 90d supply, fill #0

## 2024-04-21 MED ORDER — IBUPROFEN 800 MG PO TABS: 800.0000 mg | ORAL_TABLET | Freq: Three times a day (TID) | ORAL | 0 refills | Status: AC | PRN

## 2024-04-21 MED ORDER — HUMULIN R U-500 KWIKPEN 500 UNIT/ML ~~LOC~~ SOPN: 15.0000 [IU] | PEN_INJECTOR | Freq: Three times a day (TID) | SUBCUTANEOUS | 3 refills | Status: DC

## 2024-04-21 MED ORDER — CLOBETASOL PROPIONATE 0.05 % EX CREA: 1.0000 | TOPICAL_CREAM | Freq: Two times a day (BID) | CUTANEOUS | 2 refills | Status: AC

## 2024-04-21 MED ORDER — METRONIDAZOLE 0.75 % EX GEL: 1.0000 | Freq: Every evening | CUTANEOUS | 5 refills | Status: AC

## 2024-04-21 MED ORDER — GLIMEPIRIDE 4 MG PO TABS
4.0000 mg | ORAL_TABLET | Freq: Every day | ORAL | 3 refills | Status: AC
  Filled 2024-04-21 – 2024-09-07 (×2): qty 90, 90d supply, fill #0

## 2024-04-21 MED ORDER — TRESIBA FLEXTOUCH 200 UNIT/ML ~~LOC~~ SOPN
35.0000 [IU] | PEN_INJECTOR | Freq: Two times a day (BID) | SUBCUTANEOUS | 5 refills | Status: AC
  Filled 2024-05-03: qty 9, 25d supply, fill #0

## 2024-04-21 MED ORDER — DICLOFENAC SODIUM 75 MG PO TBEC: 75.0000 mg | DELAYED_RELEASE_TABLET | Freq: Two times a day (BID) | ORAL | 0 refills | Status: AC | PRN

## 2024-04-21 NOTE — Telephone Encounter (Signed)
 Refill request complete

## 2024-04-24 ENCOUNTER — Other Ambulatory Visit (HOSPITAL_BASED_OUTPATIENT_CLINIC_OR_DEPARTMENT_OTHER): Payer: Self-pay

## 2024-04-24 NOTE — Telephone Encounter (Signed)
 Called patient and left a VM to return call and sch a follow-up apt from 12/09/23, patient was to Return in about 6 weeks (around 01/20/2024).

## 2024-04-25 ENCOUNTER — Other Ambulatory Visit (HOSPITAL_BASED_OUTPATIENT_CLINIC_OR_DEPARTMENT_OTHER): Payer: Self-pay

## 2024-04-26 ENCOUNTER — Other Ambulatory Visit (HOSPITAL_BASED_OUTPATIENT_CLINIC_OR_DEPARTMENT_OTHER): Payer: Self-pay

## 2024-04-26 ENCOUNTER — Encounter (HOSPITAL_BASED_OUTPATIENT_CLINIC_OR_DEPARTMENT_OTHER): Payer: Self-pay

## 2024-04-26 MED ORDER — EZETIMIBE 10 MG PO TABS
10.0000 mg | ORAL_TABLET | Freq: Every day | ORAL | 3 refills | Status: AC
  Filled 2024-04-26: qty 90, 90d supply, fill #0
  Filled 2024-09-07: qty 90, 90d supply, fill #1

## 2024-04-27 ENCOUNTER — Other Ambulatory Visit (HOSPITAL_BASED_OUTPATIENT_CLINIC_OR_DEPARTMENT_OTHER): Payer: Self-pay

## 2024-05-01 ENCOUNTER — Other Ambulatory Visit (HOSPITAL_BASED_OUTPATIENT_CLINIC_OR_DEPARTMENT_OTHER): Payer: Self-pay

## 2024-05-02 ENCOUNTER — Other Ambulatory Visit (HOSPITAL_BASED_OUTPATIENT_CLINIC_OR_DEPARTMENT_OTHER): Payer: Self-pay

## 2024-05-03 ENCOUNTER — Other Ambulatory Visit: Payer: Self-pay

## 2024-05-03 ENCOUNTER — Other Ambulatory Visit (HOSPITAL_BASED_OUTPATIENT_CLINIC_OR_DEPARTMENT_OTHER): Payer: Self-pay

## 2024-05-03 MED ORDER — INSULIN DEGLUDEC 200 UNIT/ML ~~LOC~~ SOPN
35.0000 [IU] | PEN_INJECTOR | Freq: Two times a day (BID) | SUBCUTANEOUS | 5 refills | Status: AC
  Filled 2024-07-27: qty 9, 25d supply, fill #0
  Filled 2024-09-07: qty 9, 25d supply, fill #1
  Filled 2024-10-02: qty 27, 75d supply, fill #2

## 2024-05-04 ENCOUNTER — Other Ambulatory Visit (HOSPITAL_BASED_OUTPATIENT_CLINIC_OR_DEPARTMENT_OTHER): Payer: Self-pay

## 2024-05-05 ENCOUNTER — Other Ambulatory Visit (HOSPITAL_BASED_OUTPATIENT_CLINIC_OR_DEPARTMENT_OTHER): Payer: Self-pay

## 2024-05-09 ENCOUNTER — Other Ambulatory Visit (HOSPITAL_BASED_OUTPATIENT_CLINIC_OR_DEPARTMENT_OTHER): Payer: Self-pay

## 2024-05-10 ENCOUNTER — Other Ambulatory Visit (HOSPITAL_BASED_OUTPATIENT_CLINIC_OR_DEPARTMENT_OTHER): Payer: Self-pay
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
## 2024-05-12 ENCOUNTER — Ambulatory Visit (INDEPENDENT_AMBULATORY_CARE_PROVIDER_SITE_OTHER): Admitting: Neurology

## 2024-05-12 ENCOUNTER — Encounter: Payer: Self-pay | Admitting: Neurology

## 2024-05-12 ENCOUNTER — Other Ambulatory Visit (HOSPITAL_BASED_OUTPATIENT_CLINIC_OR_DEPARTMENT_OTHER): Payer: Self-pay

## 2024-05-12 VITALS — BP 149/89

## 2024-05-12 DIAGNOSIS — R42 Dizziness and giddiness: Secondary | ICD-10-CM

## 2024-05-12 DIAGNOSIS — G43E09 Chronic migraine with aura, not intractable, without status migrainosus: Secondary | ICD-10-CM | POA: Diagnosis not present

## 2024-05-12 DIAGNOSIS — R110 Nausea: Secondary | ICD-10-CM | POA: Diagnosis not present

## 2024-05-12 MED ORDER — QULIPTA 60 MG PO TABS
60.0000 mg | ORAL_TABLET | Freq: Every day | ORAL | 11 refills | Status: DC
  Filled 2024-05-12: qty 30, 30d supply, fill #0

## 2024-05-12 MED ORDER — RIZATRIPTAN BENZOATE 10 MG PO TABS
10.0000 mg | ORAL_TABLET | ORAL | 5 refills | Status: AC | PRN
  Filled 2024-05-12: qty 10, 5d supply, fill #0

## 2024-05-12 MED ORDER — ONDANSETRON HCL 8 MG PO TABS
8.0000 mg | ORAL_TABLET | Freq: Three times a day (TID) | ORAL | 5 refills | Status: AC | PRN
  Filled 2024-05-12: qty 20, 7d supply, fill #0

## 2024-05-12 NOTE — Patient Instructions (Signed)
 Plan to start Qulipta  60mg  daily.  If no improvement in 3 months, contact me. Take rizatriptan  and ondansetron  as needed for migraine attacks Avoid meclizine .  For dizziness, will send you to physical therapy for treatment (in [place]) Limit use of pain relievers to no more than 9 days out of the month to prevent risk of rebound or medication-overuse headache. Keep headache diary Follow up 8 months

## 2024-05-15 ENCOUNTER — Other Ambulatory Visit (HOSPITAL_BASED_OUTPATIENT_CLINIC_OR_DEPARTMENT_OTHER): Payer: Self-pay

## 2024-05-16 ENCOUNTER — Ambulatory Visit (INDEPENDENT_AMBULATORY_CARE_PROVIDER_SITE_OTHER): Admitting: Dermatology

## 2024-05-16 ENCOUNTER — Encounter: Payer: Self-pay | Admitting: Dermatology

## 2024-05-16 ENCOUNTER — Encounter: Payer: Self-pay | Admitting: Obstetrics & Gynecology

## 2024-05-16 ENCOUNTER — Telehealth: Payer: Self-pay | Admitting: Pharmacy Technician

## 2024-05-16 ENCOUNTER — Other Ambulatory Visit: Payer: Self-pay | Admitting: Gastroenterology

## 2024-05-16 ENCOUNTER — Ambulatory Visit (INDEPENDENT_AMBULATORY_CARE_PROVIDER_SITE_OTHER): Admitting: Obstetrics & Gynecology

## 2024-05-16 ENCOUNTER — Other Ambulatory Visit (HOSPITAL_BASED_OUTPATIENT_CLINIC_OR_DEPARTMENT_OTHER): Payer: Self-pay

## 2024-05-16 VITALS — BP 143/85 | HR 67 | Ht 63.0 in | Wt 213.6 lb

## 2024-05-16 VITALS — BP 143/81

## 2024-05-16 DIAGNOSIS — L578 Other skin changes due to chronic exposure to nonionizing radiation: Secondary | ICD-10-CM | POA: Diagnosis not present

## 2024-05-16 DIAGNOSIS — N951 Menopausal and female climacteric states: Secondary | ICD-10-CM

## 2024-05-16 DIAGNOSIS — Z1283 Encounter for screening for malignant neoplasm of skin: Secondary | ICD-10-CM | POA: Diagnosis not present

## 2024-05-16 DIAGNOSIS — D1801 Hemangioma of skin and subcutaneous tissue: Secondary | ICD-10-CM

## 2024-05-16 DIAGNOSIS — R600 Localized edema: Secondary | ICD-10-CM

## 2024-05-16 DIAGNOSIS — G35 Multiple sclerosis: Secondary | ICD-10-CM

## 2024-05-16 DIAGNOSIS — Z79890 Hormone replacement therapy (postmenopausal): Secondary | ICD-10-CM

## 2024-05-16 DIAGNOSIS — Z91410 Personal history of adult physical and sexual abuse: Secondary | ICD-10-CM | POA: Diagnosis not present

## 2024-05-16 DIAGNOSIS — R102 Pelvic and perineal pain: Secondary | ICD-10-CM

## 2024-05-16 DIAGNOSIS — R1032 Left lower quadrant pain: Secondary | ICD-10-CM | POA: Diagnosis not present

## 2024-05-16 DIAGNOSIS — K219 Gastro-esophageal reflux disease without esophagitis: Secondary | ICD-10-CM

## 2024-05-16 DIAGNOSIS — W908XXA Exposure to other nonionizing radiation, initial encounter: Secondary | ICD-10-CM | POA: Diagnosis not present

## 2024-05-16 DIAGNOSIS — L814 Other melanin hyperpigmentation: Secondary | ICD-10-CM | POA: Diagnosis not present

## 2024-05-16 DIAGNOSIS — Z90710 Acquired absence of both cervix and uterus: Secondary | ICD-10-CM | POA: Diagnosis not present

## 2024-05-16 DIAGNOSIS — I788 Other diseases of capillaries: Secondary | ICD-10-CM

## 2024-05-16 DIAGNOSIS — L219 Seborrheic dermatitis, unspecified: Secondary | ICD-10-CM

## 2024-05-16 DIAGNOSIS — L821 Other seborrheic keratosis: Secondary | ICD-10-CM

## 2024-05-16 DIAGNOSIS — D229 Melanocytic nevi, unspecified: Secondary | ICD-10-CM

## 2024-05-16 DIAGNOSIS — Z8742 Personal history of other diseases of the female genital tract: Secondary | ICD-10-CM

## 2024-05-16 DIAGNOSIS — L304 Erythema intertrigo: Secondary | ICD-10-CM

## 2024-05-16 MED ORDER — FLUOCINONIDE 0.05 % EX CREA
1.0000 | TOPICAL_CREAM | Freq: Two times a day (BID) | CUTANEOUS | 1 refills | Status: AC
  Filled 2024-05-16 – 2024-09-07 (×2): qty 60, 30d supply, fill #0

## 2024-05-16 MED ORDER — NYSTATIN 100000 UNIT/GM EX POWD
1.0000 | Freq: Every day | CUTANEOUS | 11 refills | Status: AC
  Filled 2024-05-16: qty 60, 34d supply, fill #0
  Filled 2024-09-07: qty 60, 30d supply, fill #0

## 2024-05-16 MED ORDER — KETOCONAZOLE 2 % EX SHAM
1.0000 | MEDICATED_SHAMPOO | CUTANEOUS | 0 refills | Status: AC
  Filled 2024-05-16: qty 120, 30d supply, fill #0

## 2024-05-16 MED ORDER — EVAMIST 1.53 MG/SPRAY TD SOLN
1.0000 | Freq: Every day | TRANSDERMAL | 12 refills | Status: DC
  Filled 2024-05-16 (×2): qty 8.1, 56d supply, fill #0

## 2024-05-16 MED ORDER — GABAPENTIN 300 MG PO CAPS
300.0000 mg | ORAL_CAPSULE | Freq: Three times a day (TID) | ORAL | 2 refills | Status: AC | PRN
  Filled 2024-05-16: qty 90, 30d supply, fill #0

## 2024-05-16 MED ORDER — KETOCONAZOLE 2 % EX CREA
1.0000 | TOPICAL_CREAM | Freq: Two times a day (BID) | CUTANEOUS | 5 refills | Status: AC
  Filled 2024-05-16: qty 60, 30d supply, fill #0
  Filled 2024-09-07: qty 60, 30d supply, fill #1
  Filled 2024-10-05: qty 60, 30d supply, fill #2

## 2024-05-16 NOTE — Telephone Encounter (Signed)
 Pharmacy Patient Advocate Encounter   Received notification from Fax that prior authorization for QULIPTA  60MG  is required/requested.   Insurance verification completed.   The patient is insured through [employer] .   Per test claim: PA required; PA submitted to above mentioned insurance via Latent Key/confirmation #/EOC [id number] Status is pending
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
## 2024-05-16 NOTE — Patient Instructions (Signed)

## 2024-05-16 NOTE — Progress Notes (Signed)
 New Patient Visit   Subjective  Amy Lester is a 53 y.o. female who presents for the following: Skin Cancer Screening and Full Body Skin Exam - No history of skin cancer. Family history of NMSC - no family history of melanoma. She is concerned about some moles on her face and neck. She never wear sunscreen.  The patient presents for Total-Body Skin Exam (TBSE) for skin cancer screening and mole check. The patient has spots, moles and lesions to be evaluated, some may be new or changing.  The following portions of the chart were reviewed this encounter and updated as appropriate: medications, allergies, medical history  Review of Systems:  No other skin or systemic complaints except as noted in HPI or Assessment and Plan.  Objective  Well appearing patient in no apparent distress; mood and affect are within normal limits.  A full examination was performed including scalp, head, eyes, ears, nose, lips, neck, chest, axillae, abdomen, back, buttocks, bilateral upper extremities, bilateral lower extremities, hands, feet, fingers, toes, fingernails, and toenails. All findings within normal limits unless otherwise noted below.   Relevant physical exam findings are noted in the Assessment and Plan.    Assessment & Plan   SKIN CANCER SCREENING PERFORMED TODAY.  ACTINIC DAMAGE - Chronic condition, secondary to cumulative UV/sun exposure - diffuse scaly erythematous macules with underlying dyspigmentation - Recommend daily broad spectrum sunscreen SPF 30+ to sun-exposed areas, reapply every 2 hours as needed.  - Staying in the shade or wearing long sleeves, sun glasses (UVA+UVB protection) and wide brim hats (4-inch brim around the entire circumference of the hat) are also recommended for sun protection.  - Call for new or changing lesions.  LENTIGINES, SEBORRHEIC KERATOSES, HEMANGIOMAS - Benign normal skin lesions - Benign-appearing - Call for any changes  MELANOCYTIC NEVI -  Tan-brown and/or pink-flesh-colored symmetric macules and papules - Benign appearing on exam today - Observation - Call clinic for new or changing moles - Recommend daily use of broad spectrum spf 30+ sunscreen to sun-exposed areas.   CAPILLARITIS Exam: Edema of lower legs  Treatment Plan: Recommend evaluation with PCP to work up lower leg swelling  INTERTRIGO- Flared Exam: Erythematous macerated patches in body folds  Intertrigo is a chronic recurrent rash that occurs in skin fold areas that may be associated with friction; heat; moisture; yeast; fungus; and bacteria.  It is exacerbated by increased movement / activity; sweating; and higher atmospheric temperature.  Use of an absorbant powder such as Zeasorb AF powder or other OTC antifungal powder to the area daily can prevent rash recurrence. Other options to help keep the area dry include blow drying the area after bathing or using antiperspirant products such as Duradry sweat minimizing gel.  Treatment Plan: Nystatin  powder daily to body fold areas  SEBORRHEIC DERMATITIS Exam: Pink patches with greasy scale at face, scalp, ears  Seborrheic Dermatitis is a chronic persistent rash characterized by pinkness and scaling most commonly of the mid face but also can occur on the scalp (dandruff), ears; mid chest, mid back and groin.  It tends to be exacerbated by stress and cooler weather.  People who have neurologic disease may experience new onset or exacerbation of existing seborrheic dermatitis.  The condition is not curable but treatable and can be controlled.  Treatment Plan: Ketoconazole  cream Apply to face and ears twice daily as needed  Ketoconazole  shampoo Wash face and scalp up to 3 times per week as needed  Fluocinonide  cream  apply to  face, ears twice daily as needed for itch. Use for 2 weeks then take a 2 week break before restarting.    Return in about 6 weeks (around 06/27/2024) for seborrheic dermatitis.  I, Roseline Hutchinson, CMA, am acting as scribe for RUFUS CHRISTELLA HOLY, MD .   Documentation: I have reviewed the above documentation for accuracy and completeness, and I agree with the above.  RUFUS CHRISTELLA HOLY, MD

## 2024-05-17 ENCOUNTER — Other Ambulatory Visit (HOSPITAL_COMMUNITY): Payer: Self-pay

## 2024-05-17 ENCOUNTER — Other Ambulatory Visit (HOSPITAL_BASED_OUTPATIENT_CLINIC_OR_DEPARTMENT_OTHER): Payer: Self-pay

## 2024-05-17 NOTE — Telephone Encounter (Signed)
 Pharmacy Patient Advocate Encounter  Received notification from [employer] that Prior Authorization for QULIPTA  60MG  has been APPROVED from 9.9.25 to 12.8.25. Ran test claim, Copay is $4. This test claim was processed through [facility]- copay amounts may vary at other pharmacies due to pharmacy/plan contracts, or as the patient moves through the different stages of their insurance plan.   PA #/Case ID/Reference #: [id number]

## 2024-05-18 ENCOUNTER — Other Ambulatory Visit (HOSPITAL_BASED_OUTPATIENT_CLINIC_OR_DEPARTMENT_OTHER): Payer: Self-pay

## 2024-05-18 ENCOUNTER — Encounter (HOSPITAL_BASED_OUTPATIENT_CLINIC_OR_DEPARTMENT_OTHER): Payer: Self-pay

## 2024-05-19 ENCOUNTER — Other Ambulatory Visit (HOSPITAL_BASED_OUTPATIENT_CLINIC_OR_DEPARTMENT_OTHER): Payer: Self-pay

## 2024-05-19 ENCOUNTER — Other Ambulatory Visit: Payer: Self-pay

## 2024-05-19 ENCOUNTER — Other Ambulatory Visit (HOSPITAL_COMMUNITY): Payer: Self-pay

## 2024-05-19 MED ORDER — METOPROLOL SUCCINATE ER 25 MG PO TB24
25.0000 mg | ORAL_TABLET | Freq: Every day | ORAL | 1 refills | Status: AC
  Filled 2024-05-19: qty 90, 90d supply, fill #0
  Filled 2024-09-07: qty 90, 90d supply, fill #1

## 2024-05-19 MED ORDER — NYSTATIN 100000 UNIT/GM EX CREA
TOPICAL_CREAM | CUTANEOUS | 1 refills | Status: AC
  Filled 2024-05-19: qty 30, 14d supply, fill #0
  Filled 2024-09-07: qty 30, 14d supply, fill #1

## 2024-05-19 MED ORDER — METOPROLOL SUCCINATE ER 50 MG PO TB24
50.0000 mg | ORAL_TABLET | Freq: Every day | ORAL | 1 refills | Status: AC
  Filled 2024-05-19: qty 90, 90d supply, fill #0
  Filled 2024-09-07: qty 90, 90d supply, fill #1

## 2024-05-19 MED ORDER — LOSARTAN POTASSIUM 25 MG PO TABS
25.0000 mg | ORAL_TABLET | Freq: Every day | ORAL | 1 refills | Status: AC
  Filled 2024-05-19: qty 90, 90d supply, fill #0
  Filled 2024-09-07: qty 90, 90d supply, fill #1

## 2024-05-23 ENCOUNTER — Ambulatory Visit: Admitting: Primary Care

## 2024-05-25 ENCOUNTER — Other Ambulatory Visit (HOSPITAL_BASED_OUTPATIENT_CLINIC_OR_DEPARTMENT_OTHER): Payer: Self-pay

## 2024-05-26 ENCOUNTER — Other Ambulatory Visit: Payer: Self-pay | Admitting: Neurology

## 2024-05-26 ENCOUNTER — Other Ambulatory Visit (HOSPITAL_BASED_OUTPATIENT_CLINIC_OR_DEPARTMENT_OTHER): Payer: Self-pay

## 2024-05-26 MED ORDER — QULIPTA 60 MG PO TABS
60.0000 mg | ORAL_TABLET | Freq: Every day | ORAL | 3 refills | Status: AC
  Filled 2024-05-26 – 2024-06-05 (×2): qty 90, 90d supply, fill #0
  Filled 2024-09-05: qty 90, 90d supply, fill #1

## 2024-05-30 NOTE — Therapy (Signed)
 OUTPATIENT PHYSICAL THERAPY EVALUATION   Patient Name: Amy Lester MRN: [id number] DOB:Oct 22, 1970, 53 y.o., female Today's Date: 05/31/2024  END OF SESSION:  PT End of Session - 05/31/24 1954     Visit Number 1    Number of Visits 1    PT Start Time 1515    PT Stop Time 1555    PT Time Calculation (min) 40 min    Activity Tolerance Patient tolerated treatment well    Behavior During Therapy [facility] for tasks assessed/performed          Past Medical History:  Diagnosis Date   Asthma    Blood transfusion without reported diagnosis    Chronic migraine    Common migraine with intractable migraine 02/16/2019   Diabetes mellitus without complication (HCC)    Endometriosis    Fatty liver    GERD (gastroesophageal reflux disease)    Hyperlipidemia    Hypertension    Hypothyroidism    Incontinence    Irregular heartbeat    Multiple sclerosis    Palpitations    Postconcussive syndrome 05/09/2020   Pre-diabetes    Sleep apnea    Past Surgical History:  Procedure Laterality Date   ABDOMINAL HYSTERECTOMY     CESAREAN SECTION     x1   CHOLECYSTECTOMY     COLONOSCOPY WITH PROPOFOL  N/A 11/04/2020   Surgeon: Shaaron Lamar HERO, MD; normal exam.  Repeat in 10 years.   ENDOMETRIAL ABLATION     ESOPHAGOGASTRODUODENOSCOPY (EGD) WITH PROPOFOL  N/A 11/04/2020   Surgeon: Shaaron Lamar HERO, MD; normal exam.   FOOT SURGERY Left 06/2018   foot reconstruction   REPLACEMENT TOTAL KNEE Left    Patient Active Problem List   Diagnosis Date Noted   Rectal pain 06/11/2022   Thrombosed hemorrhoids 05/20/2022   Hemorrhoids 03/17/2021   Fatigue 03/17/2021   LLQ abdominal pain 03/17/2021   Vitamin D  deficiency 01/15/2021   Non-intractable vomiting 12/13/2020   Flatulence 12/13/2020   White matter abnormality on MRI of brain 09/11/2020   Hyperlipidemia 08/28/2020   Nausea without vomiting 07/25/2020   Postconcussive syndrome 05/09/2020   Elevated LFTs 04/19/2020   Abdominal pain 04/19/2020    Dysuria 04/19/2020   Class 2 severe obesity due to excess calories with serious comorbidity and body mass index (BMI) of 38.0 to 38.9 in adult 04/12/2019   Type 2 diabetes mellitus without complication 04/12/2019   Palpitations 04/12/2019   Mild intermittent asthma without complication 04/12/2019   Seasonal allergies 04/12/2019   Hypothyroidism 04/12/2019   OSA on CPAP 04/12/2019   Gastroesophageal reflux disease 04/12/2019   Essential hypertension 04/12/2019   Intractable chronic migraine without aura 02/16/2019   MS (multiple sclerosis) 02/16/2019    PCP: Mercer Clotilda SAUNDERS, MD   REFERRING PROVIDER: Skeet Juliene SAUNDERS, DO   REFERRING DIAG: R42 (ICD-10-CM) - Vertigo   Rationale for Evaluation and Treatment:  Rehabiliation  THERAPY DIAG:  Dizziness and giddiness  ONSET DATE: chronic but worse over the last 3 months   SUBJECTIVE:  SUBJECTIVE STATEMENT: Chronic vertigo but worse over the last 3 months, she does relay MS and poor balance, migranes, and has had falls due to the dizziness. Feels her legs are weak. When she sits up from laying down she feels she is turning a 360. Turning her head can sometimes make her dizzy. Symptoms are worse when having migrane but can also get them when not having migrane.   PERTINENT HISTORY:  See above PMH, history of MS and migranes, vertigo  PAIN:  NPRS scale: has migraines, but has dizziness vs pain  PRECAUTIONS: ,  Fall  RED FLAGS: None   WEIGHT BEARING RESTRICTIONS:  No  FALLS:  Has patient fallen in last 6 months? Yes. Number of falls 3   PLOF:  Needs assistance with homemaking  PATIENT GOALS:  Reduce dizziness  OBJECTIVE:  Note: Objective measures were completed at Evaluation unless otherwise noted.   Neck ROM WNL  VESTIBULAR  ASSESSMENT:  OCULOMOTOR EXAM:  Ocular Alignment: normal  Spontaneous Nystagmus: absent  Gaze-Induced Nystagmus: absent  Smooth Pursuits: intact  Saccades: intact  Convergence/Divergence: Abnormal   POSITIONAL TESTING: Right Dix-Hallpike: no nystagmus Left Dix-Hallpike: no nystagmus Right Roll Test: no nystagmus Left Roll Test: no nystagmus    OTHOSTATICS: Supine: 136/84/, Sitting 129/87 Standing: 127/88                                                                                                                                                                                                                                                   TREATMENT DATE:  Eval HEP creation and review with demonstration and trial set preformed, see below for details Selfcare:Exam findings and that BPPV testing was negative as well as orthostatics negative. No clear cause for her dizziness but she should perform gaze stabiliation exercises, and  blaance exercises to see if they help reduce dizziness and move from supine to sidelying to sitting in slow manner to not provoke dizziness. She should also follow back up with MD if no improvement.     PATIENT EDUCATION: Education details: HEP, PT plan of care, selfcare Person educated: Patient Education method: Explanation, Demonstration, Verbal cues, and Handouts Education comprehension: verbalized understanding, further education recommended   HOME EXERCISE PROGRAM: Access Code: [email or web address] URL: [email or web address] Date: 05/31/2024 Prepared by: Redell Moose  Exercises - Seated Gaze Stabilization with Head Nod  -  2 x daily - 6 x weekly - 2 sets - 30 sec hold - Seated Gaze Stabilization with Head Rotation  - 2 x daily - 6 x weekly - 2 sets - 1 reps - 30 sec hold - Seated Proximal-Distal Smooth Pursuit  - 2 x daily - 6 x weekly - 1 sets - 10 reps - Standing Tandem Balance with Counter Support  - 2 x daily - 6 x weekly - 1 sets - 4 reps  - 30 hold - Supine to Seated Roll Out of Bed with PLB  - 2 x daily - 6 x weekly - 1 sets - 5 reps - Corner Balance Feet Together: Eyes Open With Head Turns  - 2 x daily - 6 x weekly - 1 sets - 10 reps  ASSESSMENT:  CLINICAL IMPRESSION: Patient referred to PT for Vertigo and vestibular therapy. Positional vertigo testing negative today for inner ear and also for orthostatic hypotension. She does have abnormal convergence gaze and gets dizzy moving from supine to sit of from sit to supine. There is No clear cause for her dizziness but she should perform gaze stabiliation exercises, and  blaance exercises to see if they help reduce dizziness and move from supine to sidelying to sitting in slow manner to not provoke dizziness. She should also follow back up with MD if no improvement.    OBJECTIVE IMPAIRMENTS: decreased activity tolerance for ADL's, difficulty walking, decreased balance, dizziness.  ACTIVITY LIMITATIONS: bending, liftting, walking, standing, cleaning, community activity, driving, bed mobility  PERSONAL FACTORS: see above PMH  also affecting patient's functional outcome.  REHAB POTENTIAL: Fair \\chronic  condition without known cause  CLINICAL DECISION MAKING: Evolving/moderate complexity  EVALUATION COMPLEXITY: Moderate    GOALS: This is one time visit as patient expresses financial concerns with her co pay so no goals to be written.    PLAN: PT FREQUENCY: 1 time visit due to financial restrictions   PLANNED INTERVENTIONS  97110-Therapeutic exercises, 97530- Therapeutic activity, 97112- Neuromuscular re-education, 97535- Self Care, 02859- Manual therapy, and Patient/Family education  PLAN FOR NEXT SESSION: one time visit   Redell JONELLE Moose, PT,DPT 05/31/2024, 7:55 PM

## 2024-05-31 ENCOUNTER — Other Ambulatory Visit (HOSPITAL_BASED_OUTPATIENT_CLINIC_OR_DEPARTMENT_OTHER): Payer: Self-pay

## 2024-05-31 ENCOUNTER — Encounter: Payer: Self-pay | Admitting: Physical Therapy

## 2024-05-31 ENCOUNTER — Ambulatory Visit: Attending: Neurology | Admitting: Physical Therapy

## 2024-05-31 DIAGNOSIS — R42 Dizziness and giddiness: Secondary | ICD-10-CM | POA: Diagnosis present

## 2024-06-02 ENCOUNTER — Other Ambulatory Visit (HOSPITAL_BASED_OUTPATIENT_CLINIC_OR_DEPARTMENT_OTHER): Payer: Self-pay

## 2024-06-05 ENCOUNTER — Other Ambulatory Visit (HOSPITAL_BASED_OUTPATIENT_CLINIC_OR_DEPARTMENT_OTHER): Payer: Self-pay

## 2024-06-05 ENCOUNTER — Other Ambulatory Visit: Payer: Self-pay

## 2024-06-06 ENCOUNTER — Other Ambulatory Visit: Payer: Self-pay | Admitting: *Deleted

## 2024-06-06 ENCOUNTER — Other Ambulatory Visit (HOSPITAL_BASED_OUTPATIENT_CLINIC_OR_DEPARTMENT_OTHER): Payer: Self-pay

## 2024-06-06 DIAGNOSIS — N951 Menopausal and female climacteric states: Secondary | ICD-10-CM

## 2024-06-06 DIAGNOSIS — Z79890 Hormone replacement therapy (postmenopausal): Secondary | ICD-10-CM

## 2024-06-07 ENCOUNTER — Other Ambulatory Visit (HOSPITAL_BASED_OUTPATIENT_CLINIC_OR_DEPARTMENT_OTHER): Payer: Self-pay

## 2024-06-07 ENCOUNTER — Encounter (HOSPITAL_BASED_OUTPATIENT_CLINIC_OR_DEPARTMENT_OTHER): Payer: Self-pay

## 2024-06-09 ENCOUNTER — Other Ambulatory Visit (HOSPITAL_COMMUNITY): Payer: Self-pay

## 2024-06-12 ENCOUNTER — Other Ambulatory Visit (HOSPITAL_BASED_OUTPATIENT_CLINIC_OR_DEPARTMENT_OTHER): Payer: Self-pay

## 2024-06-12 ENCOUNTER — Ambulatory Visit: Admitting: Primary Care

## 2024-06-12 MED ORDER — TECHLITE PEN NEEDLES 32G X 4 MM MISC
5 refills | Status: AC
  Filled 2024-06-12: qty 100, 33d supply, fill #0
  Filled 2024-07-27 – 2024-09-12 (×4): qty 100, 33d supply, fill #1

## 2024-06-12 NOTE — Telephone Encounter (Signed)
 Tried calling patient- left detailed message to call back.  Pt should only be using spray once daily.  She has 12 refills even if using 3x per day this past month she still should not have run out.

## 2024-06-13 ENCOUNTER — Other Ambulatory Visit (HOSPITAL_BASED_OUTPATIENT_CLINIC_OR_DEPARTMENT_OTHER): Payer: Self-pay

## 2024-06-13 ENCOUNTER — Other Ambulatory Visit: Payer: Self-pay | Admitting: Obstetrics & Gynecology

## 2024-06-13 DIAGNOSIS — Z79890 Hormone replacement therapy (postmenopausal): Secondary | ICD-10-CM

## 2024-06-13 DIAGNOSIS — N951 Menopausal and female climacteric states: Secondary | ICD-10-CM

## 2024-06-13 MED ORDER — EVAMIST 1.53 MG/SPRAY TD SOLN
2.0000 | Freq: Every day | TRANSDERMAL | 11 refills | Status: AC
  Filled 2024-06-13: qty 24.3, 56d supply, fill #0
  Filled 2024-08-10: qty 16.2, 37d supply, fill #1
  Filled 2024-08-10: qty 8.1, 19d supply, fill #1
  Filled 2024-08-10: qty 24.3, 56d supply, fill #1
  Filled 2024-09-29: qty 24.3, 56d supply, fill #2

## 2024-06-13 NOTE — Progress Notes (Signed)
 Rx for evamist   Octavion Mollenkopf, DO Attending Obstetrician & Gynecologist, [facility] for [employer], [facility]

## 2024-06-14 ENCOUNTER — Other Ambulatory Visit (HOSPITAL_COMMUNITY): Payer: Self-pay

## 2024-06-14 ENCOUNTER — Other Ambulatory Visit (HOSPITAL_BASED_OUTPATIENT_CLINIC_OR_DEPARTMENT_OTHER): Payer: Self-pay

## 2024-06-19 ENCOUNTER — Ambulatory Visit (INDEPENDENT_AMBULATORY_CARE_PROVIDER_SITE_OTHER)

## 2024-06-19 DIAGNOSIS — G35D Multiple sclerosis, unspecified: Secondary | ICD-10-CM

## 2024-06-19 DIAGNOSIS — N7092 Oophoritis, unspecified: Secondary | ICD-10-CM | POA: Diagnosis not present

## 2024-06-19 DIAGNOSIS — R102 Pelvic and perineal pain unspecified side: Secondary | ICD-10-CM

## 2024-06-19 DIAGNOSIS — Z90710 Acquired absence of both cervix and uterus: Secondary | ICD-10-CM

## 2024-06-19 NOTE — Progress Notes (Signed)
 PELVIC US  TA/TV: normal vaginal cuff,right oophorectomy,borderline enlarged left ovary,ovarian vol 9.5 ml,otherwise WNL,vascular flow visualized,unable to slide left ovary because of sever pain,bowel appears to slide posterior to vagina cuff,no free fluid  Chaperone Aleck

## 2024-06-22 ENCOUNTER — Other Ambulatory Visit (HOSPITAL_BASED_OUTPATIENT_CLINIC_OR_DEPARTMENT_OTHER): Payer: Self-pay

## 2024-06-22 MED ORDER — LOSARTAN POTASSIUM 50 MG PO TABS
50.0000 mg | ORAL_TABLET | Freq: Every day | ORAL | 1 refills | Status: AC
  Filled 2024-06-22: qty 90, 90d supply, fill #0
  Filled 2024-09-25: qty 90, 90d supply, fill #1

## 2024-06-28 ENCOUNTER — Ambulatory Visit: Admitting: Dermatology

## 2024-07-03 ENCOUNTER — Other Ambulatory Visit (HOSPITAL_BASED_OUTPATIENT_CLINIC_OR_DEPARTMENT_OTHER): Payer: Self-pay

## 2024-07-04 ENCOUNTER — Other Ambulatory Visit (HOSPITAL_BASED_OUTPATIENT_CLINIC_OR_DEPARTMENT_OTHER): Payer: Self-pay

## 2024-07-04 ENCOUNTER — Other Ambulatory Visit (HOSPITAL_COMMUNITY): Payer: Self-pay

## 2024-07-04 MED ORDER — CEFDINIR 300 MG PO CAPS
300.0000 mg | ORAL_CAPSULE | Freq: Two times a day (BID) | ORAL | 0 refills | Status: AC
  Filled 2024-07-04 (×2): qty 14, 7d supply, fill #0

## 2024-07-04 MED ORDER — DEXTROMETHORPHAN-GUAIFENESIN 10-100 MG/5ML PO SYRP
ORAL_SOLUTION | ORAL | 0 refills | Status: AC
  Filled 2024-07-04: qty 420, 7d supply, fill #0
  Filled 2024-09-07: qty 420, fill #0

## 2024-07-05 ENCOUNTER — Other Ambulatory Visit (HOSPITAL_COMMUNITY): Payer: Self-pay

## 2024-07-12 ENCOUNTER — Ambulatory Visit: Payer: Self-pay | Admitting: Obstetrics & Gynecology

## 2024-07-17 ENCOUNTER — Ambulatory Visit: Admitting: Endocrinology

## 2024-07-18 ENCOUNTER — Telehealth: Payer: Self-pay

## 2024-07-18 NOTE — Telephone Encounter (Signed)
*  [facility]  Pharmacy Patient Advocate Encounter   Received notification from [employer] that prior authorization for Qulipta  60MG  tablets  is required/requested.   Insurance verification completed.   The patient is insured through [employer].   Per test claim: PA required; However, NEW/RECENT labs/notes are needed to complete & submit PA request. Please see below.   CMM Key:  [id number]

## 2024-07-26 ENCOUNTER — Ambulatory Visit: Admitting: Primary Care

## 2024-07-26 DIAGNOSIS — G4733 Obstructive sleep apnea (adult) (pediatric): Secondary | ICD-10-CM

## 2024-07-26 DIAGNOSIS — J4520 Mild intermittent asthma, uncomplicated: Secondary | ICD-10-CM

## 2024-07-27 ENCOUNTER — Other Ambulatory Visit: Payer: Self-pay | Admitting: Endocrinology

## 2024-07-27 ENCOUNTER — Other Ambulatory Visit (HOSPITAL_BASED_OUTPATIENT_CLINIC_OR_DEPARTMENT_OTHER): Payer: Self-pay

## 2024-07-27 MED ORDER — TECHLITE PEN NEEDLES 32G X 6 MM MISC
3 refills | Status: AC
  Filled 2024-07-27: qty 100, 20d supply, fill #0
  Filled 2024-09-07: qty 100, 20d supply, fill #1
  Filled 2024-09-25: qty 100, 20d supply, fill #2

## 2024-07-27 MED ORDER — HUMULIN R U-500 KWIKPEN 500 UNIT/ML ~~LOC~~ SOPN
15.0000 [IU] | PEN_INJECTOR | Freq: Three times a day (TID) | SUBCUTANEOUS | 3 refills | Status: AC
  Filled 2024-07-27: qty 3, 28d supply, fill #0
  Filled 2024-09-07: qty 6, 56d supply, fill #1

## 2024-07-28 ENCOUNTER — Other Ambulatory Visit (HOSPITAL_BASED_OUTPATIENT_CLINIC_OR_DEPARTMENT_OTHER): Payer: Self-pay

## 2024-08-01 ENCOUNTER — Other Ambulatory Visit (HOSPITAL_BASED_OUTPATIENT_CLINIC_OR_DEPARTMENT_OTHER): Payer: Self-pay

## 2024-08-07 ENCOUNTER — Other Ambulatory Visit (HOSPITAL_BASED_OUTPATIENT_CLINIC_OR_DEPARTMENT_OTHER): Payer: Self-pay

## 2024-08-10 ENCOUNTER — Other Ambulatory Visit (HOSPITAL_BASED_OUTPATIENT_CLINIC_OR_DEPARTMENT_OTHER): Payer: Self-pay

## 2024-08-11 ENCOUNTER — Other Ambulatory Visit (HOSPITAL_BASED_OUTPATIENT_CLINIC_OR_DEPARTMENT_OTHER): Payer: Self-pay

## 2024-08-16 ENCOUNTER — Ambulatory Visit: Admitting: Obstetrics & Gynecology

## 2024-08-17 ENCOUNTER — Telehealth: Payer: Self-pay | Admitting: Pharmacy Technician

## 2024-08-17 NOTE — Telephone Encounter (Signed)
 Pharmacy Patient Advocate Encounter   Received notification from Cover[employer] that prior authorization for QULIPTA  60MG  is required/requested.   Insurance verification completed.   The patient is insured through [employer].   Per test claim: PA required; PA submitted to above mentioned insurance via Latent Key/confirmation #/EOC [id number] Status is pending

## 2024-08-18 ENCOUNTER — Other Ambulatory Visit (HOSPITAL_COMMUNITY): Payer: Self-pay

## 2024-08-18 NOTE — Telephone Encounter (Signed)
 Pharmacy Patient Advocate Encounter  Received notification from [employer] that Prior Authorization for QULIPTA  60MG  has been APPROVED from 12.11.25 to 12.11.26. Ran test claim, Copay is $4. This test claim was processed through [facility]- copay amounts may vary at other pharmacies due to pharmacy/plan contracts, or as the patient moves through the different stages of their insurance plan.   PA #/Case ID/Reference #: [id number]

## 2024-08-22 ENCOUNTER — Other Ambulatory Visit (HOSPITAL_BASED_OUTPATIENT_CLINIC_OR_DEPARTMENT_OTHER): Payer: Self-pay

## 2024-08-22 MED ORDER — ERYTHROMYCIN 5 MG/GM OP OINT
TOPICAL_OINTMENT | OPHTHALMIC | 0 refills | Status: AC
  Filled 2024-08-22: qty 3.5, 15d supply, fill #0

## 2024-08-22 MED ORDER — OMEPRAZOLE 40 MG PO CPDR
40.0000 mg | DELAYED_RELEASE_CAPSULE | Freq: Two times a day (BID) | ORAL | 11 refills | Status: AC
  Filled 2024-08-22: qty 30, 15d supply, fill #0
  Filled 2024-09-07: qty 30, 15d supply, fill #1
  Filled 2024-09-20: qty 30, 15d supply, fill #2
  Filled 2024-10-05: qty 30, 15d supply, fill #3

## 2024-08-22 MED ORDER — TRIAMTERENE-HCTZ 37.5-25 MG PO TABS
1.0000 | ORAL_TABLET | Freq: Every morning | ORAL | 11 refills | Status: AC
  Filled 2024-08-22: qty 30, 30d supply, fill #0
  Filled 2024-09-14: qty 30, 30d supply, fill #1

## 2024-09-05 ENCOUNTER — Other Ambulatory Visit (HOSPITAL_BASED_OUTPATIENT_CLINIC_OR_DEPARTMENT_OTHER): Payer: Self-pay

## 2024-09-06 ENCOUNTER — Other Ambulatory Visit (HOSPITAL_BASED_OUTPATIENT_CLINIC_OR_DEPARTMENT_OTHER): Payer: Self-pay

## 2024-09-07 ENCOUNTER — Other Ambulatory Visit (HOSPITAL_BASED_OUTPATIENT_CLINIC_OR_DEPARTMENT_OTHER): Payer: Self-pay

## 2024-09-08 ENCOUNTER — Telehealth: Payer: Self-pay

## 2024-09-08 ENCOUNTER — Other Ambulatory Visit (HOSPITAL_BASED_OUTPATIENT_CLINIC_OR_DEPARTMENT_OTHER): Payer: Self-pay

## 2024-09-08 ENCOUNTER — Other Ambulatory Visit (HOSPITAL_COMMUNITY): Payer: Self-pay

## 2024-09-08 NOTE — Telephone Encounter (Signed)
 Copied from CRM #[id number]. Topic: Clinical - Lab/Test Results >> Sep 08, 2024 10:33 AM Robinson H wrote: Reason for CRM: Gwen with [facility] is calling to obtain lipid panel results for patient.  [facility] [phone] fax

## 2024-09-11 ENCOUNTER — Other Ambulatory Visit (HOSPITAL_BASED_OUTPATIENT_CLINIC_OR_DEPARTMENT_OTHER): Payer: Self-pay

## 2024-09-11 NOTE — Telephone Encounter (Signed)
"  Faxed  "

## 2024-09-12 ENCOUNTER — Other Ambulatory Visit (HOSPITAL_BASED_OUTPATIENT_CLINIC_OR_DEPARTMENT_OTHER): Payer: Self-pay

## 2024-09-12 ENCOUNTER — Ambulatory Visit: Admitting: Primary Care

## 2024-09-13 ENCOUNTER — Other Ambulatory Visit: Payer: Self-pay | Admitting: Endocrinology

## 2024-09-13 ENCOUNTER — Other Ambulatory Visit (HOSPITAL_BASED_OUTPATIENT_CLINIC_OR_DEPARTMENT_OTHER): Payer: Self-pay

## 2024-09-13 MED ORDER — SYNTHROID 112 MCG PO TABS
224.0000 ug | ORAL_TABLET | Freq: Every day | ORAL | 3 refills | Status: AC
  Filled 2024-09-13 – 2024-09-20 (×6): qty 180, 90d supply, fill #0

## 2024-09-14 ENCOUNTER — Other Ambulatory Visit: Payer: Self-pay | Admitting: Endocrinology

## 2024-09-14 ENCOUNTER — Other Ambulatory Visit (HOSPITAL_BASED_OUTPATIENT_CLINIC_OR_DEPARTMENT_OTHER): Payer: Self-pay

## 2024-09-14 DIAGNOSIS — E063 Autoimmune thyroiditis: Secondary | ICD-10-CM

## 2024-09-14 MED ORDER — SYNTHROID 112 MCG PO TABS: 224.0000 ug | ORAL_TABLET | Freq: Every day | ORAL | 3 refills | Status: DC

## 2024-09-15 ENCOUNTER — Other Ambulatory Visit (HOSPITAL_BASED_OUTPATIENT_CLINIC_OR_DEPARTMENT_OTHER): Payer: Self-pay

## 2024-09-18 ENCOUNTER — Other Ambulatory Visit (HOSPITAL_BASED_OUTPATIENT_CLINIC_OR_DEPARTMENT_OTHER): Payer: Self-pay

## 2024-09-19 ENCOUNTER — Other Ambulatory Visit: Payer: Self-pay | Admitting: Endocrinology

## 2024-09-19 ENCOUNTER — Other Ambulatory Visit (HOSPITAL_BASED_OUTPATIENT_CLINIC_OR_DEPARTMENT_OTHER): Payer: Self-pay

## 2024-09-19 DIAGNOSIS — E063 Autoimmune thyroiditis: Secondary | ICD-10-CM

## 2024-09-19 MED ORDER — NYSTATIN 100000 UNIT/GM EX CREA
TOPICAL_CREAM | CUTANEOUS | 1 refills | Status: AC
  Filled 2024-09-19: qty 30, 30d supply, fill #0
  Filled 2024-09-26: qty 30, fill #0
  Filled 2024-09-27: qty 30, 14d supply, fill #0
  Filled 2024-10-02: qty 30, 30d supply, fill #0

## 2024-09-19 MED ORDER — SYNTHROID 112 MCG PO TABS
224.0000 ug | ORAL_TABLET | Freq: Every day | ORAL | 3 refills | Status: AC
  Filled 2024-09-19: qty 180, 90d supply, fill #0

## 2024-09-19 MED ORDER — POLYMYXIN B-TRIMETHOPRIM 10000-0.1 UNIT/ML-% OP SOLN
1.0000 [drp] | Freq: Four times a day (QID) | OPHTHALMIC | 0 refills | Status: AC
  Filled 2024-09-19: qty 10, 30d supply, fill #0

## 2024-09-20 ENCOUNTER — Other Ambulatory Visit (HOSPITAL_BASED_OUTPATIENT_CLINIC_OR_DEPARTMENT_OTHER): Payer: Self-pay

## 2024-09-20 ENCOUNTER — Ambulatory Visit: Admitting: Primary Care

## 2024-09-20 ENCOUNTER — Encounter: Payer: Self-pay | Admitting: Primary Care

## 2024-09-20 VITALS — BP 134/68 | HR 74 | Temp 98.0°F | Ht 64.0 in | Wt 217.6 lb

## 2024-09-20 DIAGNOSIS — G4733 Obstructive sleep apnea (adult) (pediatric): Secondary | ICD-10-CM | POA: Diagnosis not present

## 2024-09-20 DIAGNOSIS — G4700 Insomnia, unspecified: Secondary | ICD-10-CM | POA: Diagnosis not present

## 2024-09-20 DIAGNOSIS — J4520 Mild intermittent asthma, uncomplicated: Secondary | ICD-10-CM | POA: Diagnosis not present

## 2024-09-20 MED ORDER — ALBUTEROL SULFATE HFA 108 (90 BASE) MCG/ACT IN AERS
1.0000 | INHALATION_SPRAY | Freq: Four times a day (QID) | RESPIRATORY_TRACT | 0 refills | Status: AC | PRN
  Filled 2024-09-20: qty 6.7, 25d supply, fill #0
  Filled 2024-09-27: qty 20.1, 75d supply, fill #0

## 2024-09-20 MED ORDER — ALBUTEROL SULFATE (2.5 MG/3ML) 0.083% IN NEBU
2.5000 mg | INHALATION_SOLUTION | Freq: Four times a day (QID) | RESPIRATORY_TRACT | 3 refills | Status: AC | PRN
  Filled 2024-09-20: qty 75, 7d supply, fill #0

## 2024-09-20 NOTE — Patient Instructions (Addendum)
" °  VISIT SUMMARY: During your visit, we discussed your ongoing management of sleep apnea, asthma, and insomnia. You have been using your CPAP machine consistently and effectively, and we addressed your concerns about using it while traveling. We also reviewed your asthma symptoms and medication use, and discussed your preference to keep Lunesta  available for insomnia.  YOUR PLAN: -OBSTRUCTIVE SLEEP APNEA: Obstructive sleep apnea is a condition where your airway becomes blocked during sleep, causing breathing pauses. Your current CPAP settings are effectively controlling your apnea. We ordered a replacement CPAP machine and discussed travel CPAP options. A follow-up appointment will be scheduled within 90 days of receiving your new machine.  -MILD INTERMITTENT ASTHMA: Asthma is a condition that causes your airways to narrow and swell, leading to difficulty breathing. Your symptoms increase during spring, and you are using your albuterol  inhaler and nebulizer as needed. We refilled your prescriptions and advised you to use the nebulizer more frequently during spring. A pulmonary function test has been ordered to assess your lung function.  -INSOMNIA: Insomnia is difficulty falling or staying asleep. You have previously used Lunesta  to manage this, and we will keep it on your medication list for future use if needed.  -DRY EYE: Try systane eye drop and warm eye compress with moist heat   INSTRUCTIONS: A follow-up appointment will be scheduled within 90 days of receiving your new CPAP machine. Please use your nebulizer more frequently during the spring season and attend the pulmonary function test as ordered. Consult your eye doctor if your eye symptoms persist.   Follow-up 6-8 weeks with FU with Beth NP for CPAP compliance and 1 hour PFT prior  "

## 2024-09-20 NOTE — Progress Notes (Signed)
 "  @Patient  ID: Amy Lester, female    DOB: 03/26/1971, 54 y.o.   MRN: [id number]  Chief Complaint  Patient presents with   Medical Management of Chronic Issues    Wears CPAP-doing well.sob with exertion, wheeze occass., denies cough    Referring provider: Mercer Clotilda SAUNDERS, MD  HPI: 54 year old female, never smoked. PMH significant for HTN, OSA, mild intermittent asthma, type 2 diabetes, hypothyroidism, hyperlipidemia. Patient of Dr. Shellia.   Previous [facility] pulmonary encounter:  08/19/2022 Patient presents today for OV follow for OSA. She was last seen on 06/23/2019 by Dr. Shellia. HST 05/18/19 showed severe OSA, AHI 46/5/hr with SpO2 low 89%. She received message stating motor life had been exceeded. She has been renting a CPAP machine from a place in [place]. She needs local DME company, she needs new CPAP machine and supplies. She is sleeping well at night. No issues with pressure settings. She uses F&P full face mask size medium.   Airview download 04/29/2022 - 07/27/2022 Usage days 88/90 days (98%); 87 days (97%) greater than 4 hours Average usage 10 hours 13 minutes Pressure 16 to 20 cm H2O (16.6 cm H2O-95%) Air leaks 13.4 L/min (95%) AHI 0.3  Rental PAP machine 07/27/2022 - 08/11/2022 16/16 days 100%) greater than 4 hours Average usage 10 hours 38 minutes Pressure 16 to 20 cm H2O (17.3 cm H2O-95%) Air leaks 34.1 L/min (95%) AHI 0.3  11/18/2022 Patient presents today for CPAP compliance. She received new CPAP in February 2024. Overall she sleeps well. No excessive daytime sleepiness. She uses F&P full face mask size medium. She needs an order for bacterial air filters.   She experiences intermittent wheezing symptoms which are currently exacerbated by seasonal allergies. She is taking Singulair  10mg  daily. No shortness of breath.   Airview Oct 19, 2022 - November 17, 2022 29/29 (100); 100 > 4 hours Average usage 10 hours 23 sec Pressure 16-20cm h20 AIRLEAKS 11L/min Average AHI  0.17   01/04/2024 Discussed the use of AI scribe software for clinical note transcription with the patient, who gave verbal consent to proceed.  History of Present Illness   Amy Lester is a 54 year old female with sleep apnea and asthma who presents with sleep disturbances and CPAP equipment issues.  She experiences sleep disturbances characterized by nocturnal awakenings, increased nocturnal urination, and difficulty returning to sleep due to racing thoughts. Despite using her CPAP machine, she often wakes up tired, although not every day. She attributes her sleep issues to increased stress in her life, including personal and legal matters involving her family. She spends nine to ten hours in bed but only sleeps for about six hours.  She expresses frustration with her CPAP equipment, noting that the rubber part of the tube often detaches, requiring duct tape for repairs. She is concerned about running the machine without a filter, as she only receives one filter every six months despite needing monthly changes. Additionally, she receives replacement hoses every six months instead of every three months, which she believes contributes to equipment wear and tear.  She is currently using Lexapro  for anxiety, managed by her primary care provider. She has tried melatonin and milk thistle in the past for sleep but is not currently using them. She is undergoing therapy but feels it has not made a significant difference due to ongoing stressors in her life.  She uses Singulair  and Zyrtec  for allergies but refuses to use nasal sprays due to a negative past experience.  She mentions that her mind feels foggy, possibly due to allergies.      Airview download 12/04/2023 - 01/02/2024 Usage days 30/30 days (100%) Average usage 10 hours 54 minutes Pressure 16 to 17 cm H2O Air leak 7.3 L/min (95%) AHI 0.3  09/20/2024 Discussed the use of AI scribe software for clinical note transcription with the patient,  who gave verbal consent to proceed.  History of Present Illness Amy Lester is a 54 year old female with sleep apnea who presents for a three-month follow-up for CPAP use.  She has been using her CPAP machine every night with 100% compliance over the last 30 days, averaging 10 hours and 55 minutes per night with pressure settings between 16 and 17 cm H2O. Her apnea score is 0.4. She is frustrated about not being able to sleep in the car without the machine. Her last sleep study in 2020 showed severe sleep apnea with 45-46 apneic events per hour, which is now well controlled with current settings.  She is not currently using her sleep aid, Lunesta  1 mg, but wants it available on her medication list. She uses her nebulizer and inhaler more frequently with the change of seasons and requests refills for her albuterol  inhaler and nebulizer solution. She uses her albuterol  inhaler 2-4 times a week and the nebulizer 1-2 times a week, with increased use expected in spring.  She describes recent symptoms of her eyes being crusted shut, itching, and hurting, suspecting a possible infection. She plans to use lubricant eye drops and warm compresses and will consult her eye doctor if symptoms persist.  She experiences difficulty breathing, particularly when active or outdoors, and reports a history of chest tightness and wheezing about a month ago, which improved after receiving steroid injections. She finds it challenging to quantify her breathing difficulty on a scale, noting it varies with activity level. She is currently on Singulair  and Zyrtec .      Allergies[1]  Immunization History  Administered Date(s) Administered   Influenza,inj,Quad PF,6+ Mos 07/05/2019    Past Medical History:  Diagnosis Date   Asthma    Blood transfusion without reported diagnosis    Chronic migraine    Common migraine with intractable migraine 02/16/2019   Diabetes mellitus without complication (HCC)     Endometriosis    Fatty liver    GERD (gastroesophageal reflux disease)    Hyperlipidemia    Hypertension    Hypothyroidism    Incontinence    Irregular heartbeat    Multiple sclerosis    Palpitations    Postconcussive syndrome 05/09/2020   Pre-diabetes    Sleep apnea     Tobacco History: Tobacco Use History[2] Counseling given: Not Answered   Outpatient Medications Prior to Visit  Medication Sig Dispense Refill   acetaminophen  (TYLENOL ) 500 MG tablet Take 500 mg by mouth as needed for moderate pain.     albuterol  (PROVENTIL ) (2.5 MG/3ML) 0.083% nebulizer solution Take 3 mLs (2.5 mg total) by nebulization every 6 (six) hours as needed for wheezing or shortness of breath. 75 mL 3   albuterol  (VENTOLIN  HFA) 108 (90 Base) MCG/ACT inhaler Inhale 1-2 puffs into the lungs every 6 (six) hours as needed for wheezing or shortness of breath. 18 g 0   aspirin  EC 81 MG tablet Take 1 tablet (81 mg total) by mouth daily. Swallow whole.     Atogepant  (QULIPTA ) 60 MG TABS Take 1 tablet (60 mg total) by mouth daily. 90 tablet 3   cefdinir  (OMNICEF )  300 MG capsule Take 1 capsule (300 mg total) by mouth 2 (two) times daily. 14 capsule 0   cetirizine  (ZYRTEC ) 10 MG tablet Take 1 tablet (10 mg total) by mouth daily. 90 tablet 0   ciprofloxacin (CILOXAN) 0.3 % ophthalmic solution Place into both eyes daily.     clobetasol  cream (TEMOVATE ) 0.05 % Apply 1 Application topically 2 (two) times daily. 30 g 2   clotrimazole -betamethasone  (LOTRISONE ) cream Apply 1 application topically as needed. 45 g 2   Continuous Glucose Receiver (FREESTYLE LIBRE 3 READER) DEVI Use to monitor blood sugar 1 each 0   Cyanocobalamin  (B-12 COMPLIANCE INJECTION IJ) Inject 1 Dose as directed every 30 (thirty) days.     Dextromethorphan -guaiFENesin  10-100 MG/5ML liquid 10 mL as needed Orally every 4 hrs As needed cough 420 mL 0   diclofenac  (VOLTAREN ) 75 MG EC tablet Take 1 tablet (75 mg total) by mouth 2 (two) times daily as needed  with food for pain or swelling. 6 tablet 0   DULoxetine  (CYMBALTA ) 30 MG capsule Take 1 capsule (30 mg total) by mouth daily for 7 days, THEN 2 capsules (60 mg total) daily thereafter. 60 capsule 0   erythromycin  ophthalmic ointment Apply into the lower eyelid of affected eye four times a day as directed. 3.5 g 0   escitalopram  (LEXAPRO ) 20 MG tablet Take 1 tablet (20 mg total) by mouth daily. 30 tablet 3   estradiol  (EVAMIST ) 1.53 MG/SPRAY transdermal spray Place 2 sprays onto the skin daily. Max 3 sprays daily 8.1 mL 11   eszopiclone  (LUNESTA ) 1 MG TABS tablet Take 1 tablet (1 mg total) by mouth at bedtime as needed for sleep. Take immediately before bedtime 30 tablet 0   ezetimibe  (ZETIA ) 10 MG tablet Take 1 tablet (10 mg total) by mouth daily. 90 tablet 3   fluconazole  (DIFLUCAN ) 150 MG tablet Take one tab now.  Repeat dose in 3 days if needed. 2 tablet 0   fluocinonide  cream (LIDEX ) 0.05 % Apply 1 Application topically 2 (two) times daily. Apply to affected areas of face for pinkness and itch. Apply x 2 weeks. Wait 2 weeks before restarting. 60 g 1   gabapentin  (NEURONTIN ) 300 MG capsule Take 1 capsule (300 mg total) by mouth 3 (three) times daily as needed. 90 capsule 2   glimepiride  (AMARYL ) 4 MG tablet Take 1 tablet (4 mg total) by mouth daily before breakfast. 90 tablet 3   HUMULIN  R U-500 KWIKPEN 500 UNIT/ML KwikPen Inject 15 Units into the skin 3 (three) times daily with meals.     ibuprofen  (ADVIL ) 200 MG tablet Take 600-800 mg by mouth as needed for mild pain or moderate pain.     ibuprofen  (ADVIL ) 800 MG tablet Take 1 tablet (800 mg total) by mouth every 8 (eight) hours as needed with food or milk. 90 tablet 0   insulin  aspart (NOVOLOG  FLEXPEN) 100 UNIT/ML FlexPen Inject 12 Units into the skin 3 (three) times daily with meals. 30 mL 4   insulin  aspart (NOVOLOG ) 100 UNIT/ML FlexPen Inject 10 Units into the skin 3 (three) times daily with meals. 15 mL 11   insulin  degludec (TRESIBA   FLEXTOUCH) 200 UNIT/ML FlexTouch Pen Inject 32 Units into the skin in the morning and at bedtime. 30 mL 3   insulin  degludec (TRESIBA  FLEXTOUCH) 200 UNIT/ML FlexTouch Pen Inject 35 Units into the skin 2 (two) times daily. 9 mL 5   insulin  degludec (TRESIBA ) 200 UNIT/ML FlexTouch Pen Inject 36 Units into the  skin 2 (two) times daily. 9 mL 5   Insulin  Pen Needle (PEN NEEDLES) 31G X 6 MM MISC 1 each by Does not apply route 5 (five) times daily. 300 each 3   Insulin  Pen Needle (TECHLITE PEN NEEDLES) 32G X 4 MM MISC Use 1 pen needle subcutaneously three times daily. 100 each 5   Insulin  Pen Needle (TECHLITE PEN NEEDLES) 32G X 6 MM MISC Use 1 pen needle five times daily. 300 each 3   Insulin  Pen Needle 32G X 4 MM MISC Use 5x a day 300 each 3   Insulin  Pen Needle 32G X 4 MM MISC Use 4x a day 300 each 3   insulin  regular human CONCENTRATED (HUMULIN  R U-500 KWIKPEN) 500 UNIT/ML KwikPen Inject 15 Units into the skin 3 (three) times daily. 6 mL 3   JARDIANCE  25 MG TABS tablet Take 1 tablet (25 mg total) by mouth daily. 90 tablet 3   ketoconazole  (NIZORAL ) 2 % shampoo Apply 1 Application topically as directed. Wash scalp and face up to 3 times weekly as needed. 120 mL 0   levothyroxine  (SYNTHROID ) 112 MCG tablet Take 2 tablets (224 mcg total) by mouth daily. 180 tablet 3   losartan  (COZAAR ) 25 MG tablet Take 1 tablet (25 mg total) by mouth daily. 90 tablet 1   losartan  (COZAAR ) 50 MG tablet Take 1 tablet (50 mg total) by mouth daily. 90 tablet 1   MAGNESIUM OXIDE PO Take 1 tablet by mouth daily.     meclizine  (ANTIVERT ) 25 MG tablet Take 1 tablet (25 mg total) by mouth 3 (three) times daily as needed for dizziness. May cause drowsiness 21 tablet 0   metoprolol  succinate (TOPROL -XL) 25 MG 24 hr tablet Take 1 tablet (25 mg total) by mouth daily in the evening. 90 tablet 1   metoprolol  succinate (TOPROL -XL) 50 MG 24 hr tablet Take 1 tablet (50 mg total) by mouth at bedtime. 90 tablet 1   metroNIDAZOLE  (FLAGYL )  500 MG tablet Take 1 tablet (500 mg total) by mouth 2 (two) times daily. 14 tablet 0   metroNIDAZOLE  (METROGEL ) 1 % gel Apply 1 application topically at bedtime.      montelukast  (SINGULAIR ) 10 MG tablet Take 10 mg by mouth every morning.     nitroGLYCERIN  (NITROSTAT ) 0.4 MG SL tablet Place 1 tablet (0.4 mg total) under the tongue every 5 (five) minutes as needed for chest pain. 25 tablet 3   nystatin  (MYCOSTATIN /NYSTOP ) powder Apply 1 Application topically daily. 60 g 11   nystatin  cream (MYCOSTATIN ) Apply 1 application topically daily as needed for dry skin.     nystatin  cream (MYCOSTATIN ) Apply topically daily as needed (irritation under breast and groin folds). 30 g 1   Olopatadine HCl (PAZEO OP) Place 1 drop into both eyes daily.     omega-3 acid ethyl esters (LOVAZA ) 1 g capsule Take 1 capsule (1 g total) by mouth 2 (two) times daily. 180 capsule 3   omeprazole  (PRILOSEC) 40 MG capsule Take 1 capsule (40 mg total) by mouth 2 (two) times daily before a meal. 60 capsule 3   omeprazole  (PRILOSEC) 40 MG capsule Take 1 capsule (40 mg total) by mouth 2 (two) times daily 30 minutes before a meal. 30 capsule 11   ondansetron  (ZOFRAN ) 4 MG tablet Take 1 tablet (4 mg total) by mouth every 8 (eight) hours as needed for nausea or vomiting. 20 tablet 1   phentermine  15 MG capsule Take 1 capsule (15 mg total) by  mouth every morning. 30 capsule 2   pravastatin  (PRAVACHOL ) 10 MG tablet Take 1 tablet (10 mg total) by mouth daily. 90 tablet 3   promethazine -dextromethorphan  (PROMETHAZINE -DM) 6.25-15 MG/5ML syrup Take 5 mLs by mouth 4 (four) times daily as needed. 100 mL 0   Red Yeast Rice Extract (RED YEAST RICE PO) Take 1 tablet by mouth daily.      Respiratory Therapy Supplies (CARETOUCH CPAP & BIPAP HOSE) MISC by Does not apply route. CPAP Titration @ 16 for pressure.     Riboflavin (VITAMIN B-2 PO) Take 1 tablet by mouth daily.      rizatriptan  (MAXALT ) 10 MG tablet Take 1 tablet (10 mg total) by mouth as  needed for migraine. May repeat after 2 hours.  Maximum 2 tablets in 24 hours. 10 tablet 5   SYNTHROID  112 MCG tablet Take 2 tablets (224 mcg total) by mouth daily before breakfast. Medically necessary for brand name synthroid . 180 tablet 3   traMADol (ULTRAM) 50 MG tablet Take 50 mg by mouth every 8 (eight) hours as needed for moderate pain (pain score 4-6).     traZODone  (DESYREL ) 100 MG tablet Take 100 mg by mouth at bedtime as needed for sleep.     traZODone  (DESYREL ) 50 MG tablet Take 0.5-1 tablets (25-50 mg total) by mouth at bedtime as needed. 30 tablet 0   Tretinoin, Facial Wrinkles, (RENOVA) 0.02 % CREA Apply 1 application topically at bedtime.     triamcinolone  cream (KENALOG ) 0.1 % Apply 1 application topically 2 (two) times daily as needed. 45 g 0   triamterene -hydrochlorothiazide  (MAXZIDE -25) 37.5-25 MG tablet Take 1 tablet by mouth daily. 90 tablet 3   triamterene -hydrochlorothiazide  (MAXZIDE -25) 37.5-25 MG tablet Take 1 tablet by mouth in the morning. 30 tablet 11   trimethoprim -polymyxin b  (POLYTRIM ) ophthalmic solution Place 1 drop into the affected eye 4 (four) times daily. 10 mL 0   zonisamide  (ZONEGRAN ) 100 MG capsule Take 2 capsules (200 mg total) by mouth daily. 60 capsule 5   Continuous Glucose Sensor (FREESTYLE LIBRE 3 SENSOR) MISC 1 Device by Does not apply route every 14 (fourteen) days. Apply 1 sensor on upper arm every 14 days for continuous glucose monitoring (Patient not taking: Reported on 05/16/2024) 2 each 2   ketoconazole  (NIZORAL ) 2 % cream Apply 1 Application topically 2 (two) times daily. Apply to face, ears twice daily as needed. 60 g 5   metoprolol  tartrate (LOPRESSOR ) 25 MG tablet Take 1 tablet (25 mg total) by mouth in the morning. 90 tablet 3   metoprolol  tartrate (LOPRESSOR ) 50 MG tablet Take 1 tablet (50 mg total) by mouth at bedtime. 90 tablet 3   Multiple Vitamins-Minerals (MULTIVITAMIN WITH MINERALS) tablet Take 1 tablet by mouth daily.     nystatin  cream  (MYCOSTATIN ) Apply topically daily as needed (irritation under breast and groin folds). 30 g 1   ondansetron  (ZOFRAN ) 8 MG tablet Take 1 tablet (8 mg total) by mouth every 8 (eight) hours as needed for nausea or vomiting. 20 tablet 5   No facility-administered medications prior to visit.      Review of Systems  Review of Systems  Constitutional: Negative.   HENT: Negative.    Respiratory:  Positive for shortness of breath.   Cardiovascular: Negative.   Skin:        Dry eyes   Physical Exam  BP 134/68   Pulse 74   Temp 98 F (36.7 C) (Oral)   Ht 5' 4 (1.626 m)  Wt 217 lb 9.6 oz (98.7 kg)   SpO2 97% Comment: RA  BMI 37.35 kg/m  Physical Exam Constitutional:      Appearance: Normal appearance. She is well-developed.  HENT:     Head: Normocephalic and atraumatic.     Mouth/Throat:     Mouth: Mucous membranes are moist.     Pharynx: Oropharynx is clear.  Eyes:     Pupils: Pupils are equal, round, and reactive to light.  Cardiovascular:     Rate and Rhythm: Normal rate and regular rhythm.     Heart sounds: Normal heart sounds. No murmur heard. Pulmonary:     Effort: Pulmonary effort is normal. No respiratory distress.     Breath sounds: Normal breath sounds. No wheezing or rhonchi.  Musculoskeletal:        General: Normal range of motion.     Cervical back: Normal range of motion and neck supple.  Skin:    General: Skin is warm and dry.     Findings: No erythema or rash.  Neurological:     General: No focal deficit present.     Mental Status: She is alert and oriented to person, place, and time. Mental status is at baseline.  Psychiatric:        Mood and Affect: Mood normal.        Behavior: Behavior normal.        Thought Content: Thought content normal.        Judgment: Judgment normal.      Lab Results:  CBC    Component Value Date/Time   WBC 7.1 03/16/2024 1328   RBC 4.89 03/16/2024 1328   HGB 14.5 03/16/2024 1328   HCT 43.7 03/16/2024 1328   PLT  321 03/16/2024 1328   MCV 89.4 03/16/2024 1328   MCH 29.7 03/16/2024 1328   MCHC 33.2 03/16/2024 1328   RDW 13.1 03/16/2024 1328   LYMPHSABS 1.7 03/16/2024 1328   MONOABS 0.4 03/16/2024 1328   EOSABS 0.2 03/16/2024 1328   BASOSABS 0.0 03/16/2024 1328    BMET    Component Value Date/Time   NA 139 03/16/2024 1328   NA 138 11/24/2022 0000   K 3.9 03/16/2024 1328   CL 106 03/16/2024 1328   CO2 21 (L) 03/16/2024 1328   GLUCOSE 199 (H) 03/16/2024 1328   BUN 16 03/16/2024 1328   BUN 20 11/24/2022 0000   CREATININE 1.06 (H) 03/16/2024 1328   CALCIUM  9.3 03/16/2024 1328   GFRNONAA >60 03/16/2024 1328   GFRAA >60 02/13/2020 0011    BNP No results found for: BNP  ProBNP No results found for: PROBNP  Imaging: No results found.   Assessment & Plan:   No problem-specific Assessment & Plan notes found for this encounter.   1. Mild intermittent asthma without complication (Primary)  2. OSA on CPAP - Ambulatory Referral for DME   Assessment and Plan Assessment & Plan Obstructive sleep apnea Severe obstructive sleep apnea diagnosed in 2020 with 46 apneic events per hour. Current CPAP usage is 100% compliant with an average of 10 hours and 55 minutes per night. Apnea score is 0.4, indicating well-controlled apnea with current settings of 16-17 cm H2O. Frustration with inability to use CPAP while traveling. - Ordered replacement CPAP machine due to age of current machine. - Discussed travel CPAP options, including Air Mini [employer]et by [employer], and potential adapters for car use. - Ensured prescription for new CPAP supplies is sent to [facility]. - Will schedule follow-up for CPAP  compliance check within 90 days of receiving new machine.  Mild intermittent asthma Increased symptoms during spring. Currently using albuterol  inhaler 2-4 times a week and nebulizer 1-2 times a week. Cardiologist plans stress test to evaluate for cardiac causes of dyspnea. - Ordered pulmonary function  test to assess lung function. - Refilled albuterol  inhaler and nebulizer prescriptions.  Insomnia Previously managed with low-dose Lunesta  1 mg. Currently not using sleep aid but prefers to keep it available. - Continue to keep Lunesta  1 mg on medication list for future use if needed.   Almarie LELON Ferrari, NP 09/20/2024     [1]  Allergies Allergen Reactions   Crestor [Rosuvastatin] Other (See Comments)    Myalgia    Lipitor [Atorvastatin ] Other (See Comments)    Myalgia    Percocet [Oxycodone-Acetaminophen ] Nausea And Vomiting   Statins Other (See Comments)    Leg pain   Zocor [Simvastatin] Other (See Comments)    Myalgia   [2]  Social History Tobacco Use  Smoking Status Never   Passive exposure: Past  Smokeless Tobacco Never   "

## 2024-09-21 ENCOUNTER — Other Ambulatory Visit (HOSPITAL_BASED_OUTPATIENT_CLINIC_OR_DEPARTMENT_OTHER): Payer: Self-pay

## 2024-09-21 ENCOUNTER — Other Ambulatory Visit (HOSPITAL_COMMUNITY): Payer: Self-pay

## 2024-09-25 ENCOUNTER — Other Ambulatory Visit (HOSPITAL_BASED_OUTPATIENT_CLINIC_OR_DEPARTMENT_OTHER): Payer: Self-pay

## 2024-09-26 ENCOUNTER — Other Ambulatory Visit (HOSPITAL_BASED_OUTPATIENT_CLINIC_OR_DEPARTMENT_OTHER): Payer: Self-pay

## 2024-09-27 ENCOUNTER — Other Ambulatory Visit (HOSPITAL_BASED_OUTPATIENT_CLINIC_OR_DEPARTMENT_OTHER): Payer: Self-pay

## 2024-09-28 ENCOUNTER — Telehealth (HOSPITAL_BASED_OUTPATIENT_CLINIC_OR_DEPARTMENT_OTHER): Payer: Self-pay

## 2024-09-28 ENCOUNTER — Telehealth: Payer: Self-pay

## 2024-09-28 ENCOUNTER — Other Ambulatory Visit: Payer: Self-pay | Admitting: Endocrinology

## 2024-09-28 ENCOUNTER — Other Ambulatory Visit (HOSPITAL_BASED_OUTPATIENT_CLINIC_OR_DEPARTMENT_OTHER): Payer: Self-pay

## 2024-09-28 MED ORDER — INSULIN ASPART 100 UNIT/ML FLEXPEN
10.0000 [IU] | PEN_INJECTOR | Freq: Three times a day (TID) | SUBCUTANEOUS | 11 refills | Status: AC
  Filled 2024-09-28: qty 30, 90d supply, fill #0
  Filled 2024-09-29: qty 27, 90d supply, fill #0
  Filled 2024-10-03: qty 24, 80d supply, fill #0

## 2024-09-28 NOTE — Telephone Encounter (Signed)
 Where is this request coming from? [facility] Medication: Novolog  100u/ml flexpen Prior authorization required? Yes If YES, on primary or secondary insurance? Primary Comments: Generic discontinued, brand requires PA

## 2024-09-28 NOTE — Telephone Encounter (Signed)
 Nurse from [facility] called to see what patient's current dosage of medication should be as patient was confused.

## 2024-09-29 ENCOUNTER — Other Ambulatory Visit (HOSPITAL_COMMUNITY): Payer: Self-pay

## 2024-09-29 ENCOUNTER — Other Ambulatory Visit (HOSPITAL_BASED_OUTPATIENT_CLINIC_OR_DEPARTMENT_OTHER): Payer: Self-pay

## 2024-10-02 ENCOUNTER — Other Ambulatory Visit (HOSPITAL_BASED_OUTPATIENT_CLINIC_OR_DEPARTMENT_OTHER): Payer: Self-pay

## 2024-10-02 ENCOUNTER — Other Ambulatory Visit (HOSPITAL_COMMUNITY): Payer: Self-pay

## 2024-10-03 ENCOUNTER — Other Ambulatory Visit (HOSPITAL_BASED_OUTPATIENT_CLINIC_OR_DEPARTMENT_OTHER): Payer: Self-pay

## 2024-10-04 ENCOUNTER — Other Ambulatory Visit (HOSPITAL_BASED_OUTPATIENT_CLINIC_OR_DEPARTMENT_OTHER): Payer: Self-pay

## 2024-10-05 ENCOUNTER — Other Ambulatory Visit (HOSPITAL_BASED_OUTPATIENT_CLINIC_OR_DEPARTMENT_OTHER): Payer: Self-pay

## 2024-10-09 ENCOUNTER — Ambulatory Visit: Admitting: Endocrinology

## 2024-10-20 ENCOUNTER — Encounter

## 2024-10-27 ENCOUNTER — Ambulatory Visit: Admitting: Primary Care

## 2024-11-10 ENCOUNTER — Ambulatory Visit: Admitting: Neurology

## 2025-01-17 ENCOUNTER — Ambulatory Visit: Admitting: Endocrinology
